# Patient Record
Sex: Female | Born: 1935 | Race: White | Hispanic: No | Smoking: Former smoker
Health system: Southern US, Community
[De-identification: ages and names within clinical notes are randomized; demographics above are authoritative.]

## PROBLEM LIST (undated history)

## (undated) DIAGNOSIS — D649 Anemia, unspecified: Secondary | ICD-10-CM

## (undated) DIAGNOSIS — J189 Pneumonia, unspecified organism: Secondary | ICD-10-CM

## (undated) DIAGNOSIS — I639 Cerebral infarction, unspecified: Secondary | ICD-10-CM

## (undated) DIAGNOSIS — E038 Other specified hypothyroidism: Secondary | ICD-10-CM

## (undated) DIAGNOSIS — G2581 Restless legs syndrome: Secondary | ICD-10-CM

## (undated) DIAGNOSIS — I83009 Varicose veins of unspecified lower extremity with ulcer of unspecified site: Secondary | ICD-10-CM

## (undated) DIAGNOSIS — F419 Anxiety disorder, unspecified: Secondary | ICD-10-CM

## (undated) DIAGNOSIS — M1990 Unspecified osteoarthritis, unspecified site: Secondary | ICD-10-CM

## (undated) DIAGNOSIS — L97909 Non-pressure chronic ulcer of unspecified part of unspecified lower leg with unspecified severity: Secondary | ICD-10-CM

## (undated) DIAGNOSIS — C4490 Unspecified malignant neoplasm of skin, unspecified: Secondary | ICD-10-CM

## (undated) DIAGNOSIS — E119 Type 2 diabetes mellitus without complications: Secondary | ICD-10-CM

## (undated) DIAGNOSIS — I4821 Permanent atrial fibrillation: Secondary | ICD-10-CM

## (undated) DIAGNOSIS — I10 Essential (primary) hypertension: Secondary | ICD-10-CM

## (undated) DIAGNOSIS — H409 Unspecified glaucoma: Secondary | ICD-10-CM

## (undated) DIAGNOSIS — Z91018 Allergy to other foods: Secondary | ICD-10-CM

## (undated) DIAGNOSIS — I4891 Unspecified atrial fibrillation: Secondary | ICD-10-CM

## (undated) DIAGNOSIS — N179 Acute kidney failure, unspecified: Secondary | ICD-10-CM

## (undated) DIAGNOSIS — R7301 Impaired fasting glucose: Secondary | ICD-10-CM

## (undated) DIAGNOSIS — F32A Depression, unspecified: Secondary | ICD-10-CM

## (undated) DIAGNOSIS — R0600 Dyspnea, unspecified: Secondary | ICD-10-CM

## (undated) HISTORY — DX: Restless legs syndrome: G25.81

## (undated) HISTORY — DX: Impaired fasting glucose: R73.01

## (undated) HISTORY — DX: Other specified hypothyroidism: E03.8

## (undated) HISTORY — DX: Essential (primary) hypertension: I10

## (undated) HISTORY — PX: TOTAL KNEE ARTHROPLASTY WITH REVISION COMPONENTS: SHX6198

## (undated) HISTORY — DX: Permanent atrial fibrillation: I48.21

## (undated) HISTORY — DX: Allergy to other foods: Z91.018

## (undated) HISTORY — PX: REPLACEMENT TOTAL KNEE BILATERAL: SUR1225

---

## 1972-02-22 HISTORY — PX: MASTECTOMY: SHX3

## 1991-02-22 HISTORY — PX: ABDOMINAL HYSTERECTOMY: SHX81

## 1998-02-21 HISTORY — PX: OTHER SURGICAL HISTORY: SHX169

## 2012-02-01 LAB — CBC AND DIFFERENTIAL
HCT: 39 % (ref 36–46)
Hemoglobin: 13.3 g/dL (ref 12.0–16.0)
Platelets: 286 10*3/uL (ref 150–399)

## 2012-02-01 LAB — BASIC METABOLIC PANEL: Creatinine: 0.8 mg/dL (ref 0.5–1.1)

## 2012-02-01 LAB — HEPATIC FUNCTION PANEL
ALT: 20 U/L (ref 7–35)
Alkaline Phosphatase: 76 U/L (ref 25–125)
Bilirubin, Total: 0.8 mg/dL

## 2012-02-01 LAB — LIPID PANEL: Cholesterol: 187 mg/dL (ref 0–200)

## 2012-02-01 LAB — TSH: TSH: 0.94 u[IU]/mL (ref 0.41–5.90)

## 2012-06-05 ENCOUNTER — Encounter: Payer: Self-pay | Admitting: *Deleted

## 2012-06-05 DIAGNOSIS — G2581 Restless legs syndrome: Secondary | ICD-10-CM

## 2012-06-05 DIAGNOSIS — E038 Other specified hypothyroidism: Secondary | ICD-10-CM | POA: Insufficient documentation

## 2012-06-05 DIAGNOSIS — I10 Essential (primary) hypertension: Secondary | ICD-10-CM

## 2012-06-05 HISTORY — DX: Restless legs syndrome: G25.81

## 2012-06-12 ENCOUNTER — Ambulatory Visit (INDEPENDENT_AMBULATORY_CARE_PROVIDER_SITE_OTHER): Payer: Medicare Other | Admitting: Family Medicine

## 2012-06-12 VITALS — BP 138/87 | HR 75 | Ht 59.0 in | Wt 168.0 lb

## 2012-06-12 DIAGNOSIS — I10 Essential (primary) hypertension: Secondary | ICD-10-CM

## 2012-06-12 DIAGNOSIS — R7301 Impaired fasting glucose: Secondary | ICD-10-CM

## 2012-06-12 MED ORDER — LISINOPRIL-HYDROCHLOROTHIAZIDE 20-25 MG PO TABS: 1.0000 | ORAL_TABLET | Freq: Every day | ORAL | Status: DC

## 2012-06-12 MED ORDER — METFORMIN HCL ER 500 MG PO TB24: ORAL_TABLET | ORAL | Status: DC

## 2012-06-12 MED ORDER — LEVOTHYROXINE SODIUM 75 MCG PO TABS: 75.0000 ug | ORAL_TABLET | Freq: Every day | ORAL | Status: DC

## 2012-06-12 MED ORDER — PRAMIPEXOLE DIHYDROCHLORIDE 1 MG PO TABS: ORAL_TABLET | ORAL | Status: DC

## 2012-06-12 NOTE — Patient Instructions (Addendum)
1)  Blood Sugar - Take 1-2 of the metformin at night.  2)  BP - We'll get you back on the lisinopril/HCT.  Keep the Lasix on hand just in case you get extra swelling.      Diabetes and Exercise Regular exercise is important and can help:   Control blood glucose (sugar).  Decrease blood pressure.    Control blood lipids (cholesterol, triglycerides).  Improve overall health. BENEFITS FROM EXERCISE  Improved fitness.  Improved flexibility.  Improved endurance.  Increased bone density.  Weight control.  Increased muscle strength.  Decreased body fat.  Improvement of the body's use of insulin, a hormone.  Increased insulin sensitivity.  Reduction of insulin needs.  Reduced stress and tension.  Helps you feel better. People with diabetes who add exercise to their lifestyle gain additional benefits, including:  Weight loss.  Reduced appetite.  Improvement of the body's use of blood glucose.  Decreased risk factors for heart disease:  Lowering of cholesterol and triglycerides.  Raising the level of good cholesterol (high-density lipoproteins, HDL).  Lowering blood sugar.  Decreased blood pressure. TYPE 1 DIABETES AND EXERCISE  Exercise will usually lower your blood glucose.  If blood glucose is greater than 240 mg/dl, check urine ketones. If ketones are present, do not exercise.  Location of the insulin injection sites may need to be adjusted with exercise. Avoid injecting insulin into areas of the body that will be exercised. For example, avoid injecting insulin into:  The arms when playing tennis.  The legs when jogging. For more information, discuss this with your caregiver.  Keep a record of:  Food intake.  Type and amount of exercise.  Expected peak times of insulin action.  Blood glucose levels. Do this before, during, and after exercise. Review your records with your caregiver. This will help you to develop guidelines for adjusting food  intake and insulin amounts.  TYPE 2 DIABETES AND EXERCISE  Regular physical activity can help control blood glucose.  Exercise is important because it may:  Increase the body's sensitivity to insulin.  Improve blood glucose control.  Exercise reduces the risk of heart disease. It decreases serum cholesterol and triglycerides. It also lowers blood pressure.  Those who take insulin or oral hypoglycemic agents should watch for signs of hypoglycemia. These signs include dizziness, shaking, sweating, chills, and confusion.  Body water is lost during exercise. It must be replaced. This will help to avoid loss of body fluids (dehydration) or heat stroke. Be sure to talk to your caregiver before starting an exercise program to make sure it is safe for you. Remember, any activity is better than none.  Document Released: 04/30/2003 Document Revised: 05/02/2011 Document Reviewed: 08/14/2008 [facility] Patient Information 2013 [place], [place].

## 2012-06-12 NOTE — Progress Notes (Signed)
Subjective:     Patient ID: Dana Bradford, female   DOB: 1930-04-12, 79 y.o.   MRN: [id number]  HPI:   Dana Bradford is here today to discuss a few issues.    1) Type II DM:  She continues to take her metformin and needs a refill on it. She has not had any adverse side effects with this medication.   2)  Wound:  The wound on her left lower ankle has healed.    3)  Hypertension:  Her blood pressure is high today.  She is taking the combination of Lisinopril and Lasix.   Review of Systems  Constitutional: Negative for fatigue.  Cardiovascular: Negative for chest pain, palpitations and leg swelling.  Gastrointestinal: Negative for abdominal pain and diarrhea.  Genitourinary: Negative for difficulty urinating.  Skin: Negative for wound (Left ankle wound has healed. ).  Allergic/Immunologic: Positive for food allergies (Fish (Hives) ).  Neurological: Negative.   Hematological: Negative.   Psychiatric/Behavioral: Negative.    Past Medical History  Diagnosis Date  . Other specified acquired hypothyroidism   . Hypertension   . Impaired fasting glucose    Family History  Problem Relation Age of Onset  . Cancer Mother   . Heart disease Father   . Hyperlipidemia Father        Objective:   Physical Exam  Constitutional: She appears well-nourished. No distress.  HENT:  Head: Normocephalic.  Eyes: No scleral icterus.  Neck: No thyromegaly present.  Cardiovascular: Normal rate, regular rhythm and normal heart sounds.   Pulmonary/Chest: Effort normal and breath sounds normal.  Abdominal: There is no tenderness.  Musculoskeletal: She exhibits no edema and no tenderness.  Neurological: She is alert.  Skin: Skin is warm and dry.  Psychiatric: She has a normal mood and affect. Her behavior is normal. Judgment and thought content normal.       Assessment:      Hypertension Impaired Fasting Glucose      Plan:     1)  We'll change her back to Lisinipril/HCT.   2)  She will remain on  metformin and we'll recheck her A1c in 3 months.

## 2012-06-17 ENCOUNTER — Encounter: Payer: Self-pay | Admitting: Family Medicine

## 2012-06-17 DIAGNOSIS — R7301 Impaired fasting glucose: Secondary | ICD-10-CM | POA: Insufficient documentation

## 2012-06-17 DIAGNOSIS — I10 Essential (primary) hypertension: Secondary | ICD-10-CM

## 2012-06-17 HISTORY — DX: Essential (primary) hypertension: I10

## 2012-11-02 ENCOUNTER — Other Ambulatory Visit: Payer: Self-pay | Admitting: *Deleted

## 2012-11-02 DIAGNOSIS — R7301 Impaired fasting glucose: Secondary | ICD-10-CM

## 2012-11-05 ENCOUNTER — Other Ambulatory Visit: Payer: Medicare Other

## 2012-11-05 LAB — COMPREHENSIVE METABOLIC PANEL
ALT: 20 U/L (ref 0–35)
AST: 19 U/L (ref 0–37)
Albumin: 3.9 g/dL (ref 3.5–5.2)
Alkaline Phosphatase: 54 U/L (ref 39–117)
BUN: 26 mg/dL — ABNORMAL HIGH (ref 6–23)
CO2: 27 mEq/L (ref 19–32)
Calcium: 9.5 mg/dL (ref 8.4–10.5)
Chloride: 100 mEq/L (ref 96–112)
Creat: 0.81 mg/dL (ref 0.50–1.10)
Glucose, Bld: 93 mg/dL (ref 70–99)
Potassium: 4.1 mEq/L (ref 3.5–5.3)
Sodium: 134 mEq/L — ABNORMAL LOW (ref 135–145)
Total Bilirubin: 0.4 mg/dL (ref 0.3–1.2)
Total Protein: 7 g/dL (ref 6.0–8.3)

## 2012-11-05 LAB — HEMOGLOBIN A1C
Hgb A1c MFr Bld: 6.4 % — ABNORMAL HIGH (ref <5.7)
Mean Plasma Glucose: 137 mg/dL — ABNORMAL HIGH (ref <117)

## 2012-11-12 ENCOUNTER — Ambulatory Visit (INDEPENDENT_AMBULATORY_CARE_PROVIDER_SITE_OTHER): Payer: Medicare Other | Admitting: Family Medicine

## 2012-11-12 ENCOUNTER — Encounter: Payer: Self-pay | Admitting: Family Medicine

## 2012-11-12 VITALS — BP 134/89 | HR 80 | Resp 16 | Ht 59.0 in | Wt 162.0 lb

## 2012-11-12 DIAGNOSIS — K219 Gastro-esophageal reflux disease without esophagitis: Secondary | ICD-10-CM

## 2012-11-12 DIAGNOSIS — R7301 Impaired fasting glucose: Secondary | ICD-10-CM

## 2012-11-12 MED ORDER — PANTOPRAZOLE SODIUM 40 MG PO TBEC: 40.0000 mg | DELAYED_RELEASE_TABLET | Freq: Every day | ORAL | Status: DC

## 2012-11-12 NOTE — Progress Notes (Signed)
  Subjective:    Patient ID: Dana Bradford, female    DOB: May 05, 1930, 79 y.o.   MRN: [id number]  HPI  [name] is here today to go over her most recent lab results and to discuss the conditions listed below:    1)  IFG:  She is doing well taking two metformin 500 mg pills at bedtime.   She admits that she has not really been following a diabetic diet.    2)  Hypertension:  She is doing well with her lisinopril/HCTZ 20-25 mg.    3)  GERD:  She has been having acid reflux.  She has been struggling with this problem off and on for several years.  She takes Tums as needed but she feels that her acid reflux is worsening.  4)  Leg Swelling:  She is having increased swelling in her right leg.  She has had this off and on ever since she had surgery on her right knee.     Review of Systems  Constitutional: Negative.   HENT: Negative.   Eyes: Negative.   Respiratory: Negative.   Cardiovascular: Negative.   Gastrointestinal:       Acid reflux  Endocrine: Negative.   Genitourinary: Negative.   Musculoskeletal: Negative.   Skin: Negative.   Allergic/Immunologic: Negative.   Neurological: Negative.   Hematological: Negative.   Psychiatric/Behavioral: Negative.     Past Medical History  Diagnosis Date  . Other specified acquired hypothyroidism   . Hypertension   . Impaired fasting glucose   . Restless leg   . Food allergy     Fish (Hives)      Family History  Problem Relation Age of Onset  . Cancer Mother   . Heart disease Father   . Hyperlipidemia Father     History   Social History Narrative   Marital Status: Widowed    Children:  Daughter Skipper Cliche) Son Astria Jordahl)   Pets: None    Living Situation: Lives alone    Occupation: Retired    Education: Engineer, agricultural    Tobacco:  She quit smoking 35 years ago after having smoked 1 ppd for 20 years.      Alcohol Use:  One glass of wine twice a week     Drug Use:  None   Diet:  Regular   Exercise:  None   Hobbies: Crafting, Gardening.                 Objective:   Physical Exam  Vitals reviewed. Constitutional: She is oriented to person, place, and time. She appears well-developed and well-nourished.  Cardiovascular: Normal rate and regular rhythm.   Pulmonary/Chest: Effort normal and breath sounds normal.  Neurological: She is alert and oriented to person, place, and time.  Skin: Skin is warm and dry.  Psychiatric: She has a normal mood and affect.          Assessment & Plan:

## 2012-11-12 NOTE — Patient Instructions (Addendum)
1)  Blood Sugar - We need for you to work harder on your diet and exercise.  If your A1c (average blood sugar over 3 months) goes to 6.5%, that officially gives you the diagnosis.  You can help decrease your sugar by limiting foods with sugar and by limiting "bad carbs" for example:  White rice, potatoes, bread, pasta.    2)  GERD - Take the Dexilant 1 per day till you get your Protonix in the mail.     1800 Calorie Diet for Diabetes Meal Planning The 1800 calorie diet is designed for eating up to 1800 calories each day. Following this diet and making healthy meal choices can help improve overall health. This diet controls blood sugar (glucose) levels and can also help lower blood pressure and cholesterol. SERVING SIZES Measuring foods and serving sizes helps to make sure you are getting the right amount of food. The list below tells how big or small some common serving sizes are:  1 oz.........4 stacked dice.  3 oz........Marland KitchenDeck of cards.  1 tsp.......Marland KitchenTip of little finger.  1 tbs......Marland KitchenMarland KitchenThumb.  2 tbs.......Marland KitchenGolf ball.   cup......Marland KitchenHalf of a fist.  1 cup.......Marland KitchenA fist. GUIDELINES FOR CHOOSING FOODS The goal of this diet is to eat a variety of foods and limit calories to 1800 each day. This can be done by choosing foods that are low in calories and fat. The diet also suggests eating small amounts of food frequently. Doing this helps control your blood glucose levels so they do not get too high or too low. Each meal or snack may include a protein food source to help you feel more satisfied and to stabilize your blood glucose. Try to eat about the same amount of food around the same time each day. This includes weekend days, travel days, and days off work. Space your meals about 4 to 5 hours apart and add a snack between them if you wish.  For example, a daily food plan could include breakfast, a morning snack, lunch, dinner, and an evening snack. Healthy meals and snacks include whole grains,  vegetables, fruits, lean meats, poultry, fish, and dairy products. As you plan your meals, select a variety of foods. Choose from the bread and starch, vegetable, fruit, dairy, and meat/protein groups. Examples of foods from each group and their suggested serving sizes are listed below. Use measuring cups and spoons to become familiar with what a healthy portion looks like. Bread and Starch Each serving equals 15 grams of carbohydrates.  1 slice bread.   bagel.   cup cold cereal (unsweetened).   cup hot cereal or mashed potatoes.  1 small potato (size of a computer mouse).   cup cooked pasta or rice.   English muffin.  1 cup broth-based soup.  3 cups of popcorn.  4 to 6 whole-wheat crackers.   cup cooked beans, peas, or corn. Vegetable Each serving equals 5 grams of carbohydrates.   cup cooked vegetables.  1 cup raw vegetables.   cup tomato or vegetable juice. Fruit Each serving equals 15 grams of carbohydrates.  1 small apple or orange.  1 cup watermelon or strawberries.   cup applesauce (no sugar added).  2 tbs raisins.   banana.   cup canned fruit, packed in water, its own juice, or sweetened with a sugar substitute.   cup unsweetened fruit juice. Dairy Each serving equals 12 to 15 grams of carbohydrates.  1 cup fat-free milk.  6 oz artificially sweetened yogurt or plain yogurt.  1 cup low-fat buttermilk.  1 cup soy milk.  1 cup almond milk. Meat/Protein  1 large egg.  2 to 3 oz meat, poultry, or fish.   cup low-fat cottage cheese.  1 tbs peanut butter.  1 oz low-fat cheese.   cup tuna in water.   cup tofu. Fat  1 tsp oil.  1 tsp trans-fat-free margarine.  1 tsp butter.  1 tsp mayonnaise.  2 tbs avocado.  1 tbs salad dressing.  1 tbs cream cheese.  2 tbs sour cream. SAMPLE 1800 CALORIE DIET PLAN Breakfast   cup unsweetened cereal (1 carb serving).  1 cup fat-free milk (1 carb serving).  1 slice  whole-wheat toast (1 carb serving).   small banana (1 carb serving).  1 scrambled egg.  1 tsp trans-fat-free margarine. Lunch  Tuna sandwich.  2 slices whole-wheat bread (2 carb servings).   cup canned tuna in water, drained.  1 tbs reduced fat mayonnaise.  1 stalk celery, chopped.  2 slices tomato.  1 lettuce leaf.  1 cup carrot sticks.  24 to 30 seedless grapes (2 carb servings).  6 oz light yogurt (1 carb serving). Afternoon Snack  3 graham cracker squares (1 carb serving).  Fat-free milk, 1 cup (1 carb serving).  1 tbs peanut butter. Dinner  3 oz salmon, broiled with 1 tsp oil.  1 cup mashed potatoes (2 carb servings) with 1 tsp trans-fat-free margarine.  1 cup fresh or frozen green beans.  1 cup steamed asparagus.  1 cup fat-free milk (1 carb serving). Evening Snack  3 cups air-popped popcorn (1 carb serving).  2 tbs parmesan cheese sprinkled on top. MEAL PLAN Use this worksheet to help you make a daily meal plan based on the 1800 calorie diet suggestions. If you are using this plan to help you control your blood glucose, you may interchange carbohydrate-containing foods (dairy, starches, and fruits). Select a variety of fresh foods of varying colors and flavors. The total amount of carbohydrate in your meals or snacks is more important than making sure you include all of the food groups every time you eat. Choose from the following foods to build your day's meals:  8 Starches.  4 Vegetables.  3 Fruits.  2 Dairy.  6 to 7 oz Meat/Protein.  Up to 4 Fats. Your dietician can use this worksheet to help you decide how many servings and which types of foods are right for you. BREAKFAST Food Group and Servings / Food Choice Starch ________________________________________________________ Dairy _________________________________________________________ Fruit _________________________________________________________ Meat/Protein  __________________________________________________ Fat ___________________________________________________________ LUNCH Food Group and Servings / Food Choice Starch ________________________________________________________ Meat/Protein __________________________________________________ Vegetable _____________________________________________________ Fruit _________________________________________________________ Dairy _________________________________________________________ Fat ___________________________________________________________ Aura Fey Food Group and Servings / Food Choice Starch ________________________________________________________ Meat/Protein __________________________________________________ Fruit __________________________________________________________ Dairy _________________________________________________________ Laural Golden Food Group and Servings / Food Choice Starch _________________________________________________________ Meat/Protein ___________________________________________________ Dairy __________________________________________________________ Vegetable ______________________________________________________ Fruit ___________________________________________________________ Fat ____________________________________________________________ Lollie Sails Food Group and Servings / Food Choice Fruit __________________________________________________________ Meat/Protein ___________________________________________________ Dairy __________________________________________________________ Starch _________________________________________________________ DAILY TOTALS Starch ____________________________ Vegetable _________________________ Fruit _____________________________ Dairy _____________________________ Meat/Protein______________________ Fat _______________________________ Document Released: 08/30/2004 Document Revised: 05/02/2011 Document Reviewed:  12/24/2010 [employer] Patient Information 2014 [place], [place].  Complementary and Alternative Medical Therapies for Diabetes Complementary and alternative medicines are health care practices or products that are not always accepted as part of routine medicine. Complementary medicine is used along with routine medicine (medical therapy). Alternative medicine can sometimes be used instead of routine medicine. Some people use these methods to treat diabetes. While some of  these therapies may be effective, others may not be. Some may even be harmful. Patients using these methods need to tell their caregiver. It is important to let your caregivers know what you are doing. Some of these therapies are discussed below. For more information, talk with your caregiver. THERAPIES Acupuncture Acupuncture is done by a professional who inserts needles into certain points on the skin. Some scientists believe that this triggers the release of the body's natural painkillers. It has been shown to relieve long-term (chronic) pain. This may help patients with painful nerve damage caused by diabetes. Biofeedback Biofeedback helps a person become more aware of the body's response to pain. It also helps you learn to deal with the pain. This alternative therapy focuses on relaxation and stress-reduction techniques. Thinking of peaceful mental images (guided imagery) is one technique. Some people believe these images can ease their condition. MEDICATIONS Chromium Several studies report that chromium supplements may improve diabetes control. Chromium helps insulin improve its action. Research is not yet certain. Supplements have not been recommended or approved. Caution is needed if you have kidney (renal) problems. Ginseng There are several types of ginseng plants. American ginseng is used for diabetes studies. Those studies have shown some glucose-lowering effects. Those effects have been seen with fasting and after-meal blood  glucose levels. They have also been seen in A1c levels (average blood glucose levels over a 19-month period). More long-term studies are needed before recommendations for use of ginseng can be made. Magnesium Experts have studied the relationship between magnesium and diabetes for many years. But it is not yet fully understood. Studies suggest that a low amount of magnesium may make blood glucose control worse in type 2 diabetes. Research also shows that a low amount may contribute to certain diabetes complications. One study showed that people who consume more magnesium had less risk of type 2 diabetes. Eating whole grains, nuts, and green leafy vegetables raises the magnesium level. Vanadium Vanadium is a compound found in tiny amounts in plants and animals. Early studies showed that vanadium improved blood glucose levels in animals with type 1 and type 2 diabetes. One study found that when given vanadium, those with diabetes were able to decrease their insulin dosage. Researchers still need to learn how it works in the body to discover any side effects, and to find safe dosages. Cinnamon There have been a couple of studies that seem to indicate cinnamon decreases insulin resistance and increases insulin production. By doing so, it may lower blood glucose. Exact doses are unknown, but it may work best when used in combination with other diabetes medicines. Document Released: 12/05/2006 Document Revised: 05/02/2011 Document Reviewed: 12/18/2008 [facility] Patient Information 2014 [place], [place].

## 2012-11-13 ENCOUNTER — Encounter: Payer: Self-pay | Admitting: Family Medicine

## 2012-11-13 DIAGNOSIS — K219 Gastro-esophageal reflux disease without esophagitis: Secondary | ICD-10-CM | POA: Insufficient documentation

## 2012-11-13 HISTORY — DX: Gastro-esophageal reflux disease without esophagitis: K21.9

## 2012-11-13 NOTE — Assessment & Plan Note (Signed)
She will begin on Protonix.  A prescription was sent to [facility].

## 2012-11-13 NOTE — Assessment & Plan Note (Signed)
She is going to work harder on her diet and exercise.

## 2012-11-30 ENCOUNTER — Other Ambulatory Visit: Payer: Self-pay | Admitting: Family Medicine

## 2013-05-13 ENCOUNTER — Ambulatory Visit (INDEPENDENT_AMBULATORY_CARE_PROVIDER_SITE_OTHER): Payer: Medicare Other | Admitting: Family Medicine

## 2013-05-13 ENCOUNTER — Encounter: Payer: Self-pay | Admitting: Family Medicine

## 2013-05-13 ENCOUNTER — Encounter (INDEPENDENT_AMBULATORY_CARE_PROVIDER_SITE_OTHER): Payer: Self-pay

## 2013-05-13 VITALS — BP 141/96 | HR 69 | Resp 16 | Wt 164.0 lb

## 2013-05-13 DIAGNOSIS — I10 Essential (primary) hypertension: Secondary | ICD-10-CM

## 2013-05-13 DIAGNOSIS — E039 Hypothyroidism, unspecified: Secondary | ICD-10-CM

## 2013-05-13 DIAGNOSIS — E119 Type 2 diabetes mellitus without complications: Secondary | ICD-10-CM

## 2013-05-13 DIAGNOSIS — G2581 Restless legs syndrome: Secondary | ICD-10-CM

## 2013-05-13 DIAGNOSIS — H6120 Impacted cerumen, unspecified ear: Secondary | ICD-10-CM

## 2013-05-13 DIAGNOSIS — K219 Gastro-esophageal reflux disease without esophagitis: Secondary | ICD-10-CM

## 2013-05-13 LAB — COMPLETE METABOLIC PANEL WITH GFR
ALT: 21 U/L (ref 0–35)
AST: 19 U/L (ref 0–37)
Albumin: 4.1 g/dL (ref 3.5–5.2)
Alkaline Phosphatase: 61 U/L (ref 39–117)
BUN: 23 mg/dL (ref 6–23)
CO2: 29 mEq/L (ref 19–32)
Calcium: 9.5 mg/dL (ref 8.4–10.5)
Chloride: 98 mEq/L (ref 96–112)
Creat: 0.82 mg/dL (ref 0.50–1.10)
GFR, Est African American: 78 mL/min
GFR, Est Non African American: 68 mL/min
Glucose, Bld: 101 mg/dL — ABNORMAL HIGH (ref 70–99)
Potassium: 4.5 mEq/L (ref 3.5–5.3)
Sodium: 136 mEq/L (ref 135–145)
Total Bilirubin: 0.5 mg/dL (ref 0.2–1.2)
Total Protein: 7 g/dL (ref 6.0–8.3)

## 2013-05-13 LAB — T3, FREE: T3, Free: 2.9 pg/mL (ref 2.3–4.2)

## 2013-05-13 LAB — T4, FREE: Free T4: 1.68 ng/dL (ref 0.80–1.80)

## 2013-05-13 LAB — TSH: TSH: 0.597 u[IU]/mL (ref 0.350–4.500)

## 2013-05-13 LAB — POCT GLYCOSYLATED HEMOGLOBIN (HGB A1C): Hemoglobin A1C: 6

## 2013-05-13 MED ORDER — PANTOPRAZOLE SODIUM 40 MG PO TBEC: 40.0000 mg | DELAYED_RELEASE_TABLET | Freq: Every day | ORAL | Status: DC

## 2013-05-13 MED ORDER — PRAMIPEXOLE DIHYDROCHLORIDE 1 MG PO TABS: 1.0000 mg | ORAL_TABLET | Freq: Every evening | ORAL | Status: DC

## 2013-05-13 MED ORDER — METFORMIN HCL ER 500 MG PO TB24: 1000.0000 mg | ORAL_TABLET | Freq: Every day | ORAL | Status: DC

## 2013-05-13 MED ORDER — METOPROLOL SUCCINATE ER 25 MG PO TB24: 25.0000 mg | ORAL_TABLET | Freq: Every evening | ORAL | Status: DC

## 2013-05-13 MED ORDER — LEVOTHYROXINE SODIUM 75 MCG PO TABS: 75.0000 ug | ORAL_TABLET | Freq: Every day | ORAL | Status: DC

## 2013-05-13 MED ORDER — LISINOPRIL-HYDROCHLOROTHIAZIDE 20-25 MG PO TABS: 1.0000 | ORAL_TABLET | Freq: Every morning | ORAL | Status: DC

## 2013-05-13 NOTE — Patient Instructions (Signed)
1)  Blood Pressure - We are adding a low dosage of another medication called Toprol XL 25 mg which I want you to take in the evening.  This will hopefully give you better 24 hour coverage of your blood pressure.  Check your readings at home and bring in your machine when you come in for your ear flushing.   2)  Ear Wax - Fill your ears nightly with the Debrox as I demonstrated (straightening/opening your ear canal) for at least one week prior to [place] flushing the ear canals.    3)  Blood Sugar - Your A1c is the best that it has been 6% so keep taking 1000 mg of the metformin and continue to watch your diet.     Cerumen Impaction The structures of the external ear canal secrete a waxy substance known as cerumen. Excess cerumen can build up in the ear canal, causing a condition known as cerumen impaction. Cerumen impaction can cause ear pain as well as disrupt the function of the ear. The rate of cerumen production differs for each individual. For certain individuals, the configuration of one's ear canal may cause him or her to have a decreased ability to naturally remove cerumen. It is important to note that removing cerumen as a part of normal hygiene is not necessary, and the use of swabs in the ear canal is not recommended. SYMPTOMS   Diminished hearing.  Ear drainage.  Ear pain.  Ear itch. CAUSES  Excessive cerumen production.  RISK INCREASES WITH:  Frequent use of swabs to clean ears.  Narrow ear canals.  Eczema (a skin condition).  Dehydration. PREVENTION  Do Not insert objects into the ear, even with the intent of cleaning the ear.  Maintain hydration.  Control eczema if present. TREATMENT  Maintaining preventative measures is the best way to treat cerumen impaction. If symptoms of cerumen impaction develop the first step is to use over-the-counter or prescription ear drops that are intended to soften the cerumen. If the cerumen does not clear, then visit your caregiver to  have the cerumen removed. The most common method for cerumen removal is through irrigation with warm water. Although, some caregivers use ear curettes and other instruments to remove the cerumen physically. In the most severe cases, cerumen may be removed surgically. Document Released: 02/07/2005 Document Revised: 05/02/2011 Document Reviewed: 05/22/2008 [employer] [facility] Patient Information 2014 [place], [place].

## 2013-05-13 NOTE — Progress Notes (Signed)
Subjective:    Patient ID: Dana Bradford, female    DOB: Jun 21, 1930, 80 y.o.   MRN: [id number]  HPI  Dana Bradford is here today to get her medications refilled.  She has new prescription plan and she would like her prescriptions written for 90 day supply.  She also wants to discuss the conditions listed below:   1)  Hearing Problem - She feels that her ears may need to be flushed.  She occasionally gets too much wax built up and it interferes with her hearing.   2)  Type II DM - She continues taking her Metformin (500 mg, daily).  She needs her A1c rechecked.   3)  RLS - Her symptoms are controlled with Mirapex.   4)  Hypothyroidism - Her energy level seems to be normal on her current dosage.     Review of Systems  Constitutional: Negative for activity change, fatigue and unexpected weight change.  HENT: Positive for hearing loss (She feels that she has wax buildup.  ). Negative for ear discharge and ear pain.   Eyes: Negative.   Respiratory: Negative for shortness of breath.   Cardiovascular: Negative for chest pain, palpitations and leg swelling.  Gastrointestinal: Negative for diarrhea and constipation.  Endocrine: Negative.   Genitourinary: Negative for difficulty urinating.  Musculoskeletal: Negative.   Skin: Negative.   Neurological: Negative.   Hematological: Negative for adenopathy. Does not bruise/bleed easily.  Psychiatric/Behavioral: Negative for sleep disturbance and dysphoric mood. The patient is not nervous/anxious.      Past Medical History  Diagnosis Date  . Other specified acquired hypothyroidism   . Hypertension   . Impaired fasting glucose   . Restless leg   . Food allergy     Fish (Hives)      Past Surgical History  Procedure Laterality Date  . Spleenectomy  2000  . Abdominal hysterectomy  1993    Uterine Prolapse  . Mastectomy  1974     History   Social History Narrative   Marital Status: Widowed    Children:  Daughter Dana Bradford) Son  Dana Bradford)   Pets: None    Living Situation: Lives alone    Occupation: Retired    Education: Programmer, systems    Tobacco:  She quit smoking 35 years ago after having smoked 1 ppd for 20 years.      Alcohol Use:  One glass of wine twice a week     Drug Use:  None   Diet:  Regular   Exercise:  None   Hobbies: Crafting, Gardening.               Family History  Problem Relation Age of Onset  . Cancer Mother   . Heart disease Father   . Hyperlipidemia Father      Current Outpatient Prescriptions on File Prior to Visit  Medication Sig Dispense Refill  . bimatoprost (LUMIGAN) 0.03 % ophthalmic solution 1 drop at bedtime.      . timolol (TIMOPTIC) 0.5 % ophthalmic solution       . furosemide (LASIX) 40 MG tablet Take 40 mg by mouth daily.       No current facility-administered medications on file prior to visit.     Allergies  Allergen Reactions  . Arthrotec [Diclofenac-Misoprostol] Diarrhea    Shortness of breath  . Celebrex [Celecoxib] Other (See Comments)    GI Bleeding     Immunization History  Administered Date(s) Administered  . Pneumococcal Polysaccharide-23 02/21/2010  .  Tdap 02/21/2010  . Zoster 02/21/2010      Objective:   Physical Exam  Vitals reviewed. Constitutional: She is oriented to person, place, and time.  HENT:  Right Ear: Decreased hearing is noted.  Left Ear: Decreased hearing is noted.  Cerumen is blocking ear canals   Eyes: Conjunctivae are normal. No scleral icterus.  Neck: Neck supple. No thyromegaly present.  Cardiovascular: Normal rate, regular rhythm and normal heart sounds.   Pulmonary/Chest: Effort normal and breath sounds normal.  Musculoskeletal: She exhibits no edema and no tenderness.  Lymphadenopathy:    She has no cervical adenopathy.  Neurological: She is alert and oriented to person, place, and time.  Skin: Skin is warm and dry.  Psychiatric: She has a normal mood and affect. Her behavior is normal. Judgment and  thought content normal.      Assessment & Plan:    Dana Bradford was seen today for medication management.  Diagnoses and associated orders for this visit:  Type II or unspecified type diabetes mellitus without mention of complication, not stated as uncontrolled - POCT glycosylated hemoglobin (Hb A1C) 6.0% - metFORMIN (GLUCOPHAGE-XR) 500 MG 24 hr tablet; Take 2 tablets (1,000 mg total) by mouth at bedtime. - COMPLETE METABOLIC PANEL WITH GFR  GERD (gastroesophageal reflux disease) - pantoprazole (PROTONIX) 40 MG tablet; Take 1 tablet (40 mg total) by mouth daily.  Unspecified hypothyroidism - levothyroxine (SYNTHROID, LEVOTHROID) 75 MCG tablet; Take 1 tablet (75 mcg total) by mouth daily before breakfast.  His levels are WNL so he'll stay on his current dosage.   - TSH - T3, free - T4, free  Restless leg - pramipexole (MIRAPEX) 1 MG tablet; Take 1 tablet (1 mg total) by mouth every evening. Take up to 2 pills per day  Essential hypertension, benign - metoprolol succinate (TOPROL-XL) 25 MG 24 hr tablet; Take 1 tablet (25 mg total) by mouth every evening. - lisinopril-hydrochlorothiazide (PRINZIDE,ZESTORETIC) 20-25 MG per tablet; Take 1 tablet by mouth every morning.  Cerumen impaction Comments: She is to put Debrox in her ears and return in a week to have them flushed.      TIME SPENT "FACE TO FACE" WITH PATIENT -  30 MINS

## 2013-06-04 ENCOUNTER — Telehealth: Payer: Self-pay | Admitting: *Deleted

## 2013-06-04 NOTE — Telephone Encounter (Signed)
Contacted patient to inform her that her lab results were normal.  She was scheduled also to have her ears flushed but she cancelled her 06/05/13 appt.  She said that her ears were bothering her and she went to see na ENT.  Her ears are much better now.  Her results are being mailed to her. PG

## 2013-06-05 ENCOUNTER — Ambulatory Visit: Payer: Medicare Other | Admitting: Family Medicine

## 2014-07-22 DIAGNOSIS — E876 Hypokalemia: Secondary | ICD-10-CM | POA: Insufficient documentation

## 2014-07-22 DIAGNOSIS — R609 Edema, unspecified: Secondary | ICD-10-CM | POA: Insufficient documentation

## 2014-07-23 DIAGNOSIS — T8453XA Infection and inflammatory reaction due to internal right knee prosthesis, initial encounter: Secondary | ICD-10-CM

## 2014-07-23 HISTORY — DX: Infection and inflammatory reaction due to internal right knee prosthesis, initial encounter: T84.53XA

## 2014-08-08 DIAGNOSIS — T8459XA Infection and inflammatory reaction due to other internal joint prosthesis, initial encounter: Secondary | ICD-10-CM | POA: Insufficient documentation

## 2014-08-08 HISTORY — DX: Infection and inflammatory reaction due to other internal joint prosthesis, initial encounter: T84.59XA

## 2014-08-12 DIAGNOSIS — N819 Female genital prolapse, unspecified: Secondary | ICD-10-CM | POA: Insufficient documentation

## 2014-08-12 HISTORY — DX: Female genital prolapse, unspecified: N81.9

## 2014-11-18 ENCOUNTER — Encounter (HOSPITAL_BASED_OUTPATIENT_CLINIC_OR_DEPARTMENT_OTHER): Payer: PRIVATE HEALTH INSURANCE

## 2014-11-29 DIAGNOSIS — R739 Hyperglycemia, unspecified: Secondary | ICD-10-CM | POA: Insufficient documentation

## 2014-11-29 DIAGNOSIS — T368X5A Adverse effect of other systemic antibiotics, initial encounter: Secondary | ICD-10-CM | POA: Insufficient documentation

## 2014-12-01 DIAGNOSIS — E872 Acidosis, unspecified: Secondary | ICD-10-CM | POA: Insufficient documentation

## 2014-12-26 DIAGNOSIS — I87303 Chronic venous hypertension (idiopathic) without complications of bilateral lower extremity: Secondary | ICD-10-CM | POA: Insufficient documentation

## 2014-12-26 DIAGNOSIS — I890 Lymphedema, not elsewhere classified: Secondary | ICD-10-CM | POA: Insufficient documentation

## 2014-12-26 HISTORY — DX: Chronic venous hypertension (idiopathic) without complications of bilateral lower extremity: I87.303

## 2014-12-26 HISTORY — DX: Lymphedema, not elsewhere classified: I89.0

## 2017-04-21 HISTORY — PX: OTHER SURGICAL HISTORY: SHX169

## 2017-05-13 DIAGNOSIS — Z89421 Acquired absence of other right toe(s): Secondary | ICD-10-CM | POA: Insufficient documentation

## 2017-05-13 HISTORY — DX: Acquired absence of other right toe(s): Z89.421

## 2017-05-16 MED ORDER — ENOXAPARIN SODIUM 40 MG/0.4ML ~~LOC~~ SOLN
40.00 | SUBCUTANEOUS | Status: DC
Start: 2017-05-16 — End: 2017-05-16

## 2017-05-16 MED ORDER — LEVOTHYROXINE SODIUM 88 MCG PO TABS
88.00 | ORAL_TABLET | ORAL | Status: DC
Start: 2017-05-16 — End: 2017-05-16

## 2017-05-16 MED ORDER — METOPROLOL SUCCINATE ER 50 MG PO TB24
50.00 | ORAL_TABLET | ORAL | Status: DC
Start: 2017-05-15 — End: 2017-05-16

## 2017-05-16 MED ORDER — INSULIN LISPRO 100 UNIT/ML ~~LOC~~ SOLN
2.00 | SUBCUTANEOUS | Status: DC
Start: 2017-05-16 — End: 2017-05-16

## 2017-05-16 MED ORDER — ASPIRIN EC 81 MG PO TBEC
81.00 | DELAYED_RELEASE_TABLET | ORAL | Status: DC
Start: 2017-05-16 — End: 2017-05-16

## 2017-05-16 MED ORDER — PRAVASTATIN SODIUM 10 MG PO TABS
20.00 | ORAL_TABLET | ORAL | Status: DC
Start: 2017-05-16 — End: 2017-05-16

## 2017-05-16 MED ORDER — LOSARTAN POTASSIUM 50 MG PO TABS
100.00 | ORAL_TABLET | ORAL | Status: DC
Start: 2017-05-16 — End: 2017-05-16

## 2017-05-16 MED ORDER — GABAPENTIN 300 MG PO CAPS
300.00 | ORAL_CAPSULE | ORAL | Status: DC
Start: 2017-05-15 — End: 2017-05-16

## 2017-05-16 MED ORDER — PRAMIPEXOLE DIHYDROCHLORIDE 0.25 MG PO TABS
1.00 | ORAL_TABLET | ORAL | Status: DC
Start: 2017-05-15 — End: 2017-05-16

## 2017-05-16 MED ORDER — GENERIC EXTERNAL MEDICATION
500.00 | Status: DC
Start: ? — End: 2017-05-16

## 2017-05-16 MED ORDER — DEXTROSE 10 % IV SOLN
125.00 | INTRAVENOUS | Status: DC
Start: ? — End: 2017-05-16

## 2017-05-16 MED ORDER — ACETAMINOPHEN 325 MG PO TABS
650.00 | ORAL_TABLET | ORAL | Status: DC
Start: 2017-05-15 — End: 2017-05-16

## 2017-05-16 MED ORDER — CEPHALEXIN 500 MG PO CAPS
500.00 | ORAL_CAPSULE | ORAL | Status: DC
Start: 2017-05-15 — End: 2017-05-16

## 2017-05-22 DIAGNOSIS — I639 Cerebral infarction, unspecified: Secondary | ICD-10-CM

## 2017-05-22 DIAGNOSIS — Z8673 Personal history of transient ischemic attack (TIA), and cerebral infarction without residual deficits: Secondary | ICD-10-CM | POA: Insufficient documentation

## 2017-05-22 HISTORY — DX: Cerebral infarction, unspecified: I63.9

## 2017-06-12 DIAGNOSIS — H34231 Retinal artery branch occlusion, right eye: Secondary | ICD-10-CM

## 2017-06-12 HISTORY — DX: Retinal artery branch occlusion, right eye: H34.231

## 2017-10-18 DIAGNOSIS — H401134 Primary open-angle glaucoma, bilateral, indeterminate stage: Secondary | ICD-10-CM | POA: Insufficient documentation

## 2017-10-18 DIAGNOSIS — E1165 Type 2 diabetes mellitus with hyperglycemia: Secondary | ICD-10-CM

## 2017-10-18 DIAGNOSIS — D3131 Benign neoplasm of right choroid: Secondary | ICD-10-CM | POA: Insufficient documentation

## 2017-10-18 DIAGNOSIS — H34211 Partial retinal artery occlusion, right eye: Secondary | ICD-10-CM | POA: Insufficient documentation

## 2017-10-18 DIAGNOSIS — H1045 Other chronic allergic conjunctivitis: Secondary | ICD-10-CM | POA: Insufficient documentation

## 2017-10-18 DIAGNOSIS — H353122 Nonexudative age-related macular degeneration, left eye, intermediate dry stage: Secondary | ICD-10-CM

## 2017-10-18 DIAGNOSIS — H04123 Dry eye syndrome of bilateral lacrimal glands: Secondary | ICD-10-CM | POA: Insufficient documentation

## 2017-10-18 HISTORY — DX: Nonexudative age-related macular degeneration, left eye, intermediate dry stage: H35.3122

## 2017-10-18 HISTORY — DX: Type 2 diabetes mellitus with hyperglycemia: E11.65

## 2017-10-18 HISTORY — DX: Primary open-angle glaucoma, bilateral, indeterminate stage: H40.1134

## 2017-10-18 HISTORY — DX: Partial retinal artery occlusion, right eye: H34.211

## 2018-02-06 DIAGNOSIS — M25551 Pain in right hip: Secondary | ICD-10-CM

## 2018-02-06 HISTORY — DX: Pain in right hip: M25.551

## 2018-02-06 NOTE — H&P (Signed)
TOTAL HIP ADMISSION H&P  Patient is admitted for right total hip arthroplasty.  Subjective:  Chief Complaint: right hip pain  HPI: Dana Bradford, 84 y.o. female, has a history of pain and functional disability in the right hip(s) due to arthritis and patient has failed non-surgical conservative treatments for greater than 12 weeks to include use of assistive devices and activity modification.  Onset of symptoms was gradual starting several years ago with gradually worsening course since that time.The patient noted no past surgery on the right hip(s).  Patient currently rates pain in the right hip at 10 out of 10 with activity. Patient has worsening of pain with activity and weight bearing and instability. Patient has evidence of severe bone-on-bone arthritis, right worse than left, with subchondral cystic formation and osteophyte formation by imaging studies. This condition presents safety issues increasing the risk of falls. There is no current active infection.  Patient Active Problem List   Diagnosis Date Noted  . GERD (gastroesophageal reflux disease) 11/13/2012  . Essential hypertension, benign 06/17/2012  . Impaired fasting glucose 06/17/2012  . Other specified acquired hypothyroidism 06/05/2012  . Restless leg syndrome 06/05/2012   Past Medical History:  Diagnosis Date  . Food allergy    Fish (Hives)   . Hypertension   . Impaired fasting glucose   . Other specified acquired hypothyroidism   . Restless leg     Past Surgical History:  Procedure Laterality Date  . ABDOMINAL HYSTERECTOMY  1993   Uterine Prolapse  . MASTECTOMY  1974  . Spleenectomy  2000    No current facility-administered medications for this encounter.    Current Outpatient Medications  Medication Sig Dispense Refill Last Dose  . bimatoprost (LUMIGAN) 0.03 % ophthalmic solution 1 drop at bedtime.   Taking  . furosemide (LASIX) 40 MG tablet Take 40 mg by mouth daily.   Not Taking  . levothyroxine  (SYNTHROID, LEVOTHROID) 75 MCG tablet Take 1 tablet (75 mcg total) by mouth daily before breakfast. 90 tablet 3   . lisinopril-hydrochlorothiazide (PRINZIDE,ZESTORETIC) 20-25 MG per tablet Take 1 tablet by mouth every morning. 90 tablet 3   . metFORMIN (GLUCOPHAGE-XR) 500 MG 24 hr tablet Take 2 tablets (1,000 mg total) by mouth at bedtime. 180 tablet 1   . metoprolol succinate (TOPROL-XL) 25 MG 24 hr tablet Take 1 tablet (25 mg total) by mouth every evening. 90 tablet 3   . pantoprazole (PROTONIX) 40 MG tablet Take 1 tablet (40 mg total) by mouth daily. 90 tablet 3   . pramipexole (MIRAPEX) 1 MG tablet Take 1 tablet (1 mg total) by mouth every evening. Take up to 2 pills per day 90 tablet 3   . timolol (TIMOPTIC) 0.5 % ophthalmic solution    Taking   Allergies  Allergen Reactions  . Arthrotec [Diclofenac-Misoprostol] Diarrhea    Shortness of breath  . Celebrex [Celecoxib] Other (See Comments)    GI Bleeding    Social History   Tobacco Use  . Smoking status: Former Smoker    Types: Cigarettes  . Smokeless tobacco: Never Used  Substance Use Topics  . Alcohol use: Yes    Alcohol/week: 2.0 standard drinks    Types: 2 drink(s) per week    Comment: Wine    Family History  Problem Relation Age of Onset  . Cancer Mother   . Heart disease Father   . Hyperlipidemia Father      Review of Systems  Constitutional: Negative for chills and fever.  HENT: Negative  for congestion, sore throat and tinnitus.   Eyes: Negative for double vision, photophobia and pain.  Respiratory: Negative for cough, shortness of breath and wheezing.   Cardiovascular: Negative for chest pain, palpitations and orthopnea.  Gastrointestinal: Negative for heartburn, nausea and vomiting.  Genitourinary: Negative for dysuria, frequency and urgency.  Musculoskeletal: Positive for joint pain.  Neurological: Negative for dizziness, weakness and headaches.    Objective:  Physical Exam  Well nourished and well  developed.  General: Alert and oriented x3, cooperative and pleasant, no acute distress.  Head: normocephalic, atraumatic, neck supple.  Eyes: EOMI.  Respiratory: breath sounds clear in all fields, no wheezing, rales, or rhonchi. Cardiovascular: Regular rate and rhythm, no murmurs, gallops or rubs.  Abdomen: non-tender to palpation and soft, normoactive bowel sounds. Musculoskeletal: Right Hip Exam: ROM: Flexion to 90, Internal Rotation 0, External Rotation 0, and Abduction 20 degrees. There is no tenderness over the greater trochanter bursa.  Calves soft and nontender. Motor function intact in LE. Strength 5/5 LE bilaterally. Neuro: Distal pulses 2+. Sensation to light touch intact in LE.  Vital signs in last 24 hours: Blood pressure: 150/90 mmHg Pulse: 68 bpm  Labs:   Estimated body mass index is 33.12 kg/m as calculated from the following:   Height as of 11/12/12: 4\' 11"  (1.499 m).   Weight as of 05/13/13: 74.4 kg.   Imaging Review Plain radiographs demonstrate severe degenerative joint disease of the right hip(s). The bone quality appears to be adequate for age and reported activity level.    Preoperative templating of the joint replacement has been completed, documented, and submitted to the Operating Room personnel in order to optimize intra-operative equipment management.     Assessment/Plan:  End stage arthritis, right hip(s)  The patient history, physical examination, clinical judgement of the provider and imaging studies are consistent with end stage degenerative joint disease of the right hip(s) and total hip arthroplasty is deemed medically necessary. The treatment options including medical management, injection therapy, arthroscopy and arthroplasty were discussed at length. The risks and benefits of total hip arthroplasty were presented and reviewed. The risks due to aseptic loosening, infection, stiffness, dislocation/subluxation,  thromboembolic complications and  other imponderables were discussed.  The patient acknowledged the explanation, agreed to proceed with the plan and consent was signed. Patient is being admitted for inpatient treatment for surgery, pain control, PT, OT, prophylactic antibiotics, VTE prophylaxis, progressive ambulation and ADL's and discharge planning.The patient is planning to be discharged home.   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 - Follow-up visit in 2 weeks with Dr. Wynelle Link - Begin physical therapy following surgery - Pre-operative lab work as pre-surgical testing - Prescriptions will be provided in hospital at time of discharge  Dana Duty, [facility] [place]

## 2018-02-12 NOTE — Patient Instructions (Signed)
Dana Bradford  02/12/2018   Your procedure is scheduled on: Wednesday 02/28/2018  Report to [facility] Main  Entrance              Report to admitting at  1115  AM    Call this number if you have problems the morning of surgery [phone]    Remember: Do not eat food  :After Midnight. May have clear liquids from midnight  up until 0745 am then nothing until after surgery!     CLEAR LIQUID DIET   Foods Allowed                                                                     Foods Excluded  Coffee and tea, regular and decaf                             liquids that you cannot  Plain Jell-O in any flavor                                             see through such as: Fruit ices (not with fruit pulp)                                     milk, soups, orange juice  Iced Popsicles                                    All solid food Carbonated beverages, regular and diet                                    Cranberry, grape and apple juices Sports drinks like Gatorade Lightly seasoned clear broth or consume(fat free) Sugar, honey syrup  Sample Menu Breakfast                                Lunch                                     Supper Cranberry juice                    Beef broth                            Chicken broth Jell-O                                     Grape juice  Apple juice Coffee or tea                        Jell-O                                      Popsicle                                                Coffee or tea                        Coffee or tea  _____________________________________________________________________ How to Manage Your Diabetes Before and After Surgery  Why is it important to control my blood sugar before and after surgery? . Improving blood sugar levels before and after surgery helps healing and can limit problems. . A way of improving blood sugar control is eating a healthy diet by: o   Eating less sugar and carbohydrates o  Increasing activity/exercise o  Talking with your doctor about reaching your blood sugar goals . High blood sugars (greater than 180 mg/dL) can raise your risk of infections and slow your recovery, so you will need to focus on controlling your diabetes during the weeks before surgery. . Make sure that the doctor who takes care of your diabetes knows about your planned surgery including the date and location.  How do I manage my blood sugar before surgery? . Check your blood sugar at least 4 times a day, starting 2 days before surgery, to make sure that the level is not too high or low. o Check your blood sugar the morning of your surgery when you wake up and every 2 hours until you get to the Short Stay unit. . If your blood sugar is less than 70 mg/dL, you will need to treat for low blood sugar: o Do not take insulin. o Treat a low blood sugar (less than 70 mg/dL) with  cup of clear juice (cranberry or apple), 4 glucose tablets, OR glucose gel. o Recheck blood sugar in 15 minutes after treatment (to make sure it is greater than 70 mg/dL). If your blood sugar is not greater than 70 mg/dL on recheck, call [phone] for further instructions. . Report your blood sugar to the short stay nurse when you get to Short Stay.  . If you are admitted to the hospital after surgery: o Your blood sugar will be checked by the staff and you will probably be given insulin after surgery (instead of oral diabetes medicines) to make sure you have good blood sugar levels. o The goal for blood sugar control after surgery is 80-180 mg/dL.   WHAT DO I DO ABOUT MY DIABETES MEDICATION?       The day before surgery, Take Metformin as usual.  . Do not take oral diabetes medicines (pills) the morning of surgery.                  BRUSH YOUR TEETH MORNING OF SURGERY AND RINSE YOUR MOUTH OUT, NO CHEWING GUM CANDY OR MINTS.     Take these medicines the morning of surgery with A  SIP OF WATER: Levothyroxine (Synthroid), Pantoprazole (Protonix)   DO NOT TAKE ANY DIABETIC MEDICATIONS DAY OF YOUR SURGERY!  You may not have any metal on your body including hair pins and              piercings  Do not wear jewelry, make-up, lotions, powders or perfumes, deodorant             Do not wear nail polish.  Do not shave  48 hours prior to surgery.           Do not bring valuables to the hospital. [place].  Contacts, dentures or bridgework may not be worn into surgery.  Leave suitcase in the car. After surgery it may be brought to your room.                  Please read over the following fact sheets you were given: _____________________________________________________________________             [facility] - Preparing for Surgery Before surgery, you can play an important role.  Because skin is not sterile, your skin needs to be as free of germs as possible.  You can reduce the number of germs on your skin by washing with CHG (chlorahexidine gluconate) soap before surgery.  CHG is an antiseptic cleaner which kills germs and bonds with the skin to continue killing germs even after washing. Please DO NOT use if you have an allergy to CHG or antibacterial soaps.  If your skin becomes reddened/irritated stop using the CHG and inform your nurse when you arrive at Short Stay. Do not shave (including legs and underarms) for at least 48 hours prior to the first CHG shower.  You may shave your face/neck. Please follow these instructions carefully:  1.  Shower with CHG Soap the night before surgery and the  morning of Surgery.  2.  If you choose to wash your hair, wash your hair first as usual with your  normal  shampoo.  3.  After you shampoo, rinse your hair and body thoroughly to remove the  shampoo.                           4.  Use CHG as you would any other liquid soap.  You can apply chg directly   to the skin and wash                       Gently with a scrungie or clean washcloth.  5.  Apply the CHG Soap to your body ONLY FROM THE NECK DOWN.   Do not use on face/ open                           Wound or open sores. Avoid contact with eyes, ears mouth and genitals (private parts).                       Wash face,  Genitals (private parts) with your normal soap.             6.  Wash thoroughly, paying special attention to the area where your surgery  will be performed.  7.  Thoroughly rinse your body with warm water from the neck down.  8.  DO NOT shower/wash with your normal soap after using and rinsing off  the CHG Soap.  9.  Pat yourself dry with a clean towel.            10.  Wear clean pajamas.            11.  Place clean sheets on your bed the night of your first shower and do not  sleep with pets. Day of Surgery : Do not apply any lotions/deodorants the morning of surgery.  Please wear clean clothes to the hospital/surgery center.  FAILURE TO FOLLOW THESE INSTRUCTIONS MAY RESULT IN THE CANCELLATION OF YOUR SURGERY PATIENT SIGNATURE_________________________________  NURSE SIGNATURE__________________________________  ________________________________________________________________________   Adam Phenix  An incentive spirometer is a tool that can help keep your lungs clear and active. This tool measures how well you are filling your lungs with each breath. Taking long deep breaths may help reverse or decrease the chance of developing breathing (pulmonary) problems (especially infection) following:  A long period of time when you are unable to move or be active. BEFORE THE PROCEDURE   If the spirometer includes an indicator to show your best effort, your nurse or respiratory therapist will set it to a desired goal.  If possible, sit up straight or lean slightly forward. Try not to slouch.  Hold the incentive spirometer in an upright  position. INSTRUCTIONS FOR USE  1. Sit on the edge of your bed if possible, or sit up as far as you can in bed or on a chair. 2. Hold the incentive spirometer in an upright position. 3. Breathe out normally. 4. Place the mouthpiece in your mouth and seal your lips tightly around it. 5. Breathe in slowly and as deeply as possible, raising the piston or the ball toward the top of the column. 6. Hold your breath for 3-5 seconds or for as long as possible. Allow the piston or ball to fall to the bottom of the column. 7. Remove the mouthpiece from your mouth and breathe out normally. 8. Rest for a few seconds and repeat Steps 1 through 7 at least 10 times every 1-2 hours when you are awake. Take your time and take a few normal breaths between deep breaths. 9. The spirometer may include an indicator to show your best effort. Use the indicator as a goal to work toward during each repetition. 10. After each set of 10 deep breaths, practice coughing to be sure your lungs are clear. If you have an incision (the cut made at the time of surgery), support your incision when coughing by placing a pillow or rolled up towels firmly against it. Once you are able to get out of bed, walk around indoors and cough well. You may stop using the incentive spirometer when instructed by your caregiver.  RISKS AND COMPLICATIONS  Take your time so you do not get dizzy or light-headed.  If you are in pain, you may need to take or ask for pain medication before doing incentive spirometry. It is harder to take a deep breath if you are having pain. AFTER USE  Rest and breathe slowly and easily.  It can be helpful to keep track of a log of your progress. Your caregiver can provide you with a simple table to help with this. If you are using the spirometer at home, follow these instructions: [place] IF:   You are having difficultly using the spirometer.  You have trouble using the spirometer as often as  instructed.  Your pain medication is not giving enough relief while using the spirometer.  You  develop fever of 100.5 F (38.1 C) or higher. SEEK IMMEDIATE MEDICAL CARE IF:   You cough up bloody sputum that had not been present before.  You develop fever of 102 F (38.9 C) or greater.  You develop worsening pain at or near the incision site. MAKE SURE YOU:   Understand these instructions.  Will watch your condition.  Will get help right away if you are not doing well or get worse. Document Released: 06/20/2006 Document Revised: 05/02/2011 Document Reviewed: 08/21/2006 [employer] Patient Information 2014 [employer], [place].   ________________________________________________________________________  WHAT IS A BLOOD TRANSFUSION? Blood Transfusion Information  A transfusion is the replacement of blood or some of its parts. Blood is made up of multiple cells which provide different functions.  Red blood cells carry oxygen and are used for blood loss replacement.  White blood cells fight against infection.  Platelets control bleeding.  Plasma helps clot blood.  Other blood products are available for specialized needs, such as hemophilia or other clotting disorders. BEFORE THE TRANSFUSION  Who gives blood for transfusions?   Healthy volunteers who are fully evaluated to make sure their blood is safe. This is blood bank blood. Transfusion therapy is the safest it has ever been in the practice of medicine. Before blood is taken from a donor, a complete history is taken to make sure that person has no history of diseases nor engages in risky social behavior (examples are intravenous drug use or sexual activity with multiple partners). The donor's travel history is screened to minimize risk of transmitting infections, such as malaria. The donated blood is tested for signs of infectious diseases, such as HIV and hepatitis. The blood is then tested to be sure it is compatible with you in  order to minimize the chance of a transfusion reaction. If you or a relative donates blood, this is often done in anticipation of surgery and is not appropriate for emergency situations. It takes many days to process the donated blood. RISKS AND COMPLICATIONS Although transfusion therapy is very safe and saves many lives, the main dangers of transfusion include:   Getting an infectious disease.  Developing a transfusion reaction. This is an allergic reaction to something in the blood you were given. Every precaution is taken to prevent this. The decision to have a blood transfusion has been considered carefully by your caregiver before blood is given. Blood is not given unless the benefits outweigh the risks. AFTER THE TRANSFUSION  Right after receiving a blood transfusion, you will usually feel much better and more energetic. This is especially true if your red blood cells have gotten low (anemic). The transfusion raises the level of the red blood cells which carry oxygen, and this usually causes an energy increase.  The nurse administering the transfusion will monitor you carefully for complications. HOME CARE INSTRUCTIONS  No special instructions are needed after a transfusion. You may find your energy is better. Speak with your caregiver about any limitations on activity for underlying diseases you may have. SEEK MEDICAL CARE IF:   Your condition is not improving after your transfusion.  You develop redness or irritation at the intravenous (IV) site. SEEK IMMEDIATE MEDICAL CARE IF:  Any of the following symptoms occur over the next 12 hours:  Shaking chills.  You have a temperature by mouth above 102 F (38.9 C), not controlled by medicine.  Chest, back, or muscle pain.  People around you feel you are not acting correctly or are confused.  Shortness of breath  or difficulty breathing.  Dizziness and fainting.  You get a rash or develop hives.  You have a decrease in urine  output.  Your urine turns a dark color or changes to pink, red, or brown. Any of the following symptoms occur over the next 10 days:  You have a temperature by mouth above 102 F (38.9 C), not controlled by medicine.  Shortness of breath.  Weakness after normal activity.  The white part of the eye turns yellow (jaundice).  You have a decrease in the amount of urine or are urinating less often.  Your urine turns a dark color or changes to pink, red, or brown. Document Released: 02/05/2000 Document Revised: 05/02/2011 Document Reviewed: 09/24/2007 [facility] Patient Information 2014 [place], [place].  _______________________________________________________________________

## 2018-02-12 NOTE — Progress Notes (Signed)
12/28/2017- EKG from Dr. Dion Saucier on chart  12/06/2017- Medical Clearance from Dr. Ival Bible on chart.  09/27/2017- on chart from Dr. Vear Clock, HgA1C, TSH 3rd generation

## 2018-02-15 ENCOUNTER — Inpatient Hospital Stay (HOSPITAL_COMMUNITY)
Admission: RE | Admit: 2018-02-15 | Discharge: 2018-02-15 | Disposition: A | Discharge status: Home / Self Care | Payer: PRIVATE HEALTH INSURANCE | Source: Ambulatory Visit

## 2018-02-22 ENCOUNTER — Other Ambulatory Visit (HOSPITAL_COMMUNITY): Payer: Self-pay | Admitting: *Deleted

## 2018-02-22 NOTE — Patient Instructions (Addendum)
Dana Bradford  02/22/2018   Your procedure is scheduled on: 02-28-18  Report to [facility] Main  Entrance  Report to admitting at 1115 AM    Call this number if you have problems the morning of surgery [phone]   Remember: Do not eat food  :After Midnight Clear liquids from midnight until 745 am day of surgery, then nothing by mouth after 745 am.. Dana Bradford, NO CHEWING GUM Dana Bradford.      CLEAR LIQUID DIET   Foods Allowed                                                                     Foods Excluded  Coffee and tea, regular and decaf                             liquids that you cannot  Plain Jell-O in any flavor                                             see through such as: Fruit ices (not with fruit pulp)                                     milk, soups, orange juice  Iced Popsicles                                    All solid food Carbonated beverages, regular and diet                                    Cranberry, grape and apple juices Sports drinks like Gatorade Lightly seasoned clear broth or consume(fat free) Sugar, honey syrup  Sample Menu Breakfast                                Lunch                                     Supper Cranberry juice                    Beef broth                            Chicken broth Jell-O                                     Grape juice  Apple juice Coffee or tea                        Jell-O                                      Popsicle                                                Coffee or tea                        Coffee or tea  _____________________________________________________________________ How to Manage Your Diabetes Before and After Surgery  Why is it important to control my blood sugar before and after surgery? . Improving blood sugar levels before and after surgery helps healing and can limit problems. . A way  of improving blood sugar control is eating a healthy diet by: o  Eating less sugar and carbohydrates o  Increasing activity/exercise o  Talking with your doctor about reaching your blood sugar goals . High blood sugars (greater than 180 mg/dL) can raise your risk of infections and slow your recovery, so you will need to focus on controlling your diabetes during the weeks before surgery. . Make sure that the doctor who takes care of your diabetes knows about your planned surgery including the date and location.  How do I manage my blood sugar before surgery? . Check your blood sugar at least 4 times a day, starting 2 days before surgery, to make sure that the level is not too high or low. o Check your blood sugar the morning of your surgery when you wake up and every 2 hours until you get to the Short Stay unit. . If your blood sugar is less than 70 mg/dL, you will need to treat for low blood sugar: o Do not take insulin. o Treat a low blood sugar (less than 70 mg/dL) with  cup of clear juice (cranberry or apple), 4 glucose tablets, OR glucose gel. o Recheck blood sugar in 15 minutes after treatment (to make sure it is greater than 70 mg/dL). If your blood sugar is not greater than 70 mg/dL on recheck, call [phone] for further instructions. . Report your blood sugar to the short stay nurse when you get to Short Stay.  . If you are admitted to the hospital after surgery: o Your blood sugar will be checked by the staff and you will probably be given insulin after surgery (instead of oral diabetes medicines) to make sure you have good blood sugar levels. o The goal for blood sugar control after surgery is 80-180 mg/dL.   WHAT DO I DO ABOUT MY DIABETES MEDICATION?  Marland Kitchen Do not take oral diabetes medicines (pills) the morning of surgery.  . THE NIGHT BEFORE SURGERY TAKE YOUR METFORMIN AS USUAL      . THE MORNING OF SURGERY DO NOT TAKE YOUR METFORMIN.  Marland Kitchen     Patient  Signature:  Date:   Nurse Signature:  Date:   Reviewed and Endorsed by [facility] Patient Education Committee, August 2015   Take these medicines the morning of surgery with A SIP OF WATER: LEVOTHYROXINE (SYNTHROID), PANTAPRAZOLE, EYE DROPS (PROTONIX) DO NOT TAKE ANY DIABETIC  MEDICATIONS DAY OF YOUR SURGERY                               You may not have any metal on your body including hair pins and              piercings  Do not wear jewelry, make-up, lotions, powders or perfumes, deodorant             Do not wear nail polish.  Do not shave  48 hours prior to surgery.              Men may shave face and neck.   Do not bring valuables to the hospital. Dana Bradford.  Contacts, dentures or bridgework may not be worn into surgery.  Leave suitcase in the car. After surgery it may be brought to your room.                Please read over the following fact sheets you were given: _____________________________________________________________________  [facility] - Preparing for Surgery Before surgery, you can play an important role.  Because skin is not sterile, your skin needs to be as free of germs as possible.  You can reduce the number of germs on your skin by washing with CHG (chlorahexidine gluconate) soap before surgery.  CHG is an antiseptic cleaner which kills germs and bonds with the skin to continue killing germs even after washing. Please DO NOT use if you have an allergy to CHG or antibacterial soaps.  If your skin becomes reddened/irritated stop using the CHG and inform your nurse when you arrive at Short Stay. Do not shave (including legs and underarms) for at least 48 hours prior to the first CHG shower.  You may shave your face/neck. Please follow these instructions carefully:  1.  Shower with CHG Soap the night before surgery and the  morning of Surgery.  2.  If you choose to wash your hair, wash your hair first as usual with your   normal  shampoo.  3.  After you shampoo, rinse your hair and body thoroughly to remove the  shampoo.                           4.  Use CHG as you would any other liquid soap.  You can apply chg directly  to the skin and wash                       Gently with a scrungie or clean washcloth.  5.  Apply the CHG Soap to your body ONLY FROM THE NECK DOWN.   Do not use on face/ open                           Wound or open sores. Avoid contact with eyes, ears mouth and genitals (private parts).                       Wash face,  Genitals (private parts) with your normal soap.             6.  Wash thoroughly, paying special attention to the area where your surgery  will be performed.  7.  Thoroughly  rinse your body with warm water from the neck down.  8.  DO NOT shower/wash with your normal soap after using and rinsing off  the CHG Soap.                9.  Pat yourself dry with a clean towel.            10.  Wear clean pajamas.            11.  Place clean sheets on your bed the night of your first shower and do not  sleep with pets. Day of Surgery : Do not apply any lotions/deodorants the morning of surgery.  Please wear clean clothes to the hospital/surgery center.  FAILURE TO FOLLOW THESE INSTRUCTIONS MAY RESULT IN THE CANCELLATION OF YOUR SURGERY PATIENT SIGNATURE_________________________________  NURSE SIGNATURE__________________________________  ________________________________________________________________________   Adam Phenix  An incentive spirometer is a tool that can help keep your lungs clear and active. This tool measures how well you are filling your lungs with each breath. Taking long deep breaths may help reverse or decrease the chance of developing breathing (pulmonary) problems (especially infection) following:  A long period of time when you are unable to move or be active. BEFORE THE PROCEDURE   If the spirometer includes an indicator to show your best effort, your  nurse or respiratory therapist will set it to a desired goal.  If possible, sit up straight or lean slightly forward. Try not to slouch.  Hold the incentive spirometer in an upright position. INSTRUCTIONS FOR USE  1. Sit on the edge of your bed if possible, or sit up as far as you can in bed or on a chair. 2. Hold the incentive spirometer in an upright position. 3. Breathe out normally. 4. Place the mouthpiece in your mouth and seal your lips tightly around it. 5. Breathe in slowly and as deeply as possible, raising the piston or the ball toward the top of the column. 6. Hold your breath for 3-5 seconds or for as long as possible. Allow the piston or ball to fall to the bottom of the column. 7. Remove the mouthpiece from your mouth and breathe out normally. 8. Rest for a few seconds and repeat Steps 1 through 7 at least 10 times every 1-2 hours when you are awake. Take your time and take a few normal breaths between deep breaths. 9. The spirometer may include an indicator to show your best effort. Use the indicator as a goal to work toward during each repetition. 10. After each set of 10 deep breaths, practice coughing to be sure your lungs are clear. If you have an incision (the cut made at the time of surgery), support your incision when coughing by placing a pillow or rolled up towels firmly against it. Once you are able to get out of bed, walk around indoors and cough well. You may stop using the incentive spirometer when instructed by your caregiver.  RISKS AND COMPLICATIONS  Take your time so you do not get dizzy or light-headed.  If you are in pain, you may need to take or ask for pain medication before doing incentive spirometry. It is harder to take a deep breath if you are having pain. AFTER USE  Rest and breathe slowly and easily.  It can be helpful to keep track of a log of your progress. Your caregiver can provide you with a simple table to help with this. If you are using the  spirometer  at home, follow these instructions: [place] IF:   You are having difficultly using the spirometer.  You have trouble using the spirometer as often as instructed.  Your pain medication is not giving enough relief while using the spirometer.  You develop fever of 100.5 F (38.1 C) or higher. SEEK IMMEDIATE MEDICAL CARE IF:   You cough up bloody sputum that had not been present before.  You develop fever of 102 F (38.9 C) or greater.  You develop worsening pain at or near the incision site. MAKE SURE YOU:   Understand these instructions.  Will watch your condition.  Will get help right away if you are not doing well or get worse. Document Released: 06/20/2006 Document Revised: 05/02/2011 Document Reviewed: 08/21/2006 ExitCare Patient Information 2014 ExitCare, [place].   ________________________________________________________________________  WHAT IS A BLOOD TRANSFUSION? Blood Transfusion Information  A transfusion is the replacement of blood or some of its parts. Blood is made up of multiple cells which provide different functions.  Red blood cells carry oxygen and are used for blood loss replacement.  White blood cells fight against infection.  Platelets control bleeding.  Plasma helps clot blood.  Other blood products are available for specialized needs, such as hemophilia or other clotting disorders. BEFORE THE TRANSFUSION  Who gives blood for transfusions?   Healthy volunteers who are fully evaluated to make sure their blood is safe. This is blood bank blood. Transfusion therapy is the safest it has ever been in the practice of medicine. Before blood is taken from a donor, a complete history is taken to make sure that person has no history of diseases nor engages in risky social behavior (examples are intravenous drug use or sexual activity with multiple partners). The donor's travel history is screened to minimize risk of transmitting  infections, such as malaria. The donated blood is tested for signs of infectious diseases, such as HIV and hepatitis. The blood is then tested to be sure it is compatible with you in order to minimize the chance of a transfusion reaction. If you or a relative donates blood, this is often done in anticipation of surgery and is not appropriate for emergency situations. It takes many days to process the donated blood. RISKS AND COMPLICATIONS Although transfusion therapy is very safe and saves many lives, the main dangers of transfusion include:   Getting an infectious disease.  Developing a transfusion reaction. This is an allergic reaction to something in the blood you were given. Every precaution is taken to prevent this. The decision to have a blood transfusion has been considered carefully by your caregiver before blood is given. Blood is not given unless the benefits outweigh the risks. AFTER THE TRANSFUSION  Right after receiving a blood transfusion, you will usually feel much better and more energetic. This is especially true if your red blood cells have gotten low (anemic). The transfusion raises the level of the red blood cells which carry oxygen, and this usually causes an energy increase.  The nurse administering the transfusion will monitor you carefully for complications. HOME CARE INSTRUCTIONS  No special instructions are needed after a transfusion. You may find your energy is better. Speak with your caregiver about any limitations on activity for underlying diseases you may have. SEEK MEDICAL CARE IF:   Your condition is not improving after your transfusion.  You develop redness or irritation at the intravenous (IV) site. SEEK IMMEDIATE MEDICAL CARE IF:  Any of the following symptoms occur over the next  12 hours:  Shaking chills.  You have a temperature by mouth above 102 F (38.9 C), not controlled by medicine.  Chest, back, or muscle pain.  People around you feel you are  not acting correctly or are confused.  Shortness of breath or difficulty breathing.  Dizziness and fainting.  You get a rash or develop hives.  You have a decrease in urine output.  Your urine turns a dark color or changes to pink, red, or brown. Any of the following symptoms occur over the next 10 days:  You have a temperature by mouth above 102 F (38.9 C), not controlled by medicine.  Shortness of breath.  Weakness after normal activity.  The white part of the eye turns yellow (jaundice).  You have a decrease in the amount of urine or are urinating less often.  Your urine turns a dark color or changes to pink, red, or brown. Document Released: 02/05/2000 Document Revised: 05/02/2011 Document Reviewed: 09/24/2007 [facility] Patient Information 2014 [place], [place].  _______________________________________________________________________

## 2018-02-26 ENCOUNTER — Other Ambulatory Visit: Payer: Self-pay

## 2018-02-26 ENCOUNTER — Encounter (HOSPITAL_COMMUNITY): Payer: Self-pay

## 2018-02-26 ENCOUNTER — Encounter (HOSPITAL_COMMUNITY)
Admission: RE | Admit: 2018-02-26 | Discharge: 2018-02-26 | Disposition: A | Discharge status: Home / Self Care | Payer: Medicare Other | Source: Ambulatory Visit | Attending: Orthopedic Surgery | Admitting: Orthopedic Surgery

## 2018-02-26 DIAGNOSIS — M1611 Unilateral primary osteoarthritis, right hip: Secondary | ICD-10-CM

## 2018-02-26 DIAGNOSIS — Z01812 Encounter for preprocedural laboratory examination: Secondary | ICD-10-CM

## 2018-02-26 HISTORY — DX: Unspecified glaucoma: H40.9

## 2018-02-26 HISTORY — DX: Acute kidney failure, unspecified: N17.9

## 2018-02-26 HISTORY — DX: Unspecified osteoarthritis, unspecified site: M19.90

## 2018-02-26 HISTORY — DX: Cerebral infarction, unspecified: I63.9

## 2018-02-26 LAB — CBC
HEMATOCRIT: 39.2 % (ref 36.0–46.0)
HEMOGLOBIN: 12.6 g/dL (ref 12.0–15.0)
MCH: 32.1 pg (ref 26.0–34.0)
MCHC: 32.1 g/dL (ref 30.0–36.0)
MCV: 100 fL (ref 80.0–100.0)
Platelets: 248 10*3/uL (ref 150–400)
RBC: 3.92 MIL/uL (ref 3.87–5.11)
RDW: 12.5 % (ref 11.5–15.5)
WBC: 8.1 10*3/uL (ref 4.0–10.5)
nRBC: 0 % (ref 0.0–0.2)

## 2018-02-26 LAB — SURGICAL PCR SCREEN
MRSA, PCR: NEGATIVE
Staphylococcus aureus: NEGATIVE

## 2018-02-26 LAB — HEMOGLOBIN A1C
HEMOGLOBIN A1C: 5.7 % — AB (ref 4.8–5.6)
MEAN PLASMA GLUCOSE: 116.89 mg/dL

## 2018-02-26 LAB — PROTIME-INR
INR: 0.85
Prothrombin Time: 11.5 seconds (ref 11.4–15.2)

## 2018-02-26 LAB — COMPREHENSIVE METABOLIC PANEL
ALT: 19 U/L (ref 0–44)
AST: 22 U/L (ref 15–41)
Albumin: 4 g/dL (ref 3.5–5.0)
Alkaline Phosphatase: 66 U/L (ref 38–126)
Anion gap: 10 (ref 5–15)
BILIRUBIN TOTAL: 0.7 mg/dL (ref 0.3–1.2)
BUN: 35 mg/dL — ABNORMAL HIGH (ref 8–23)
CO2: 28 mmol/L (ref 22–32)
Calcium: 9.2 mg/dL (ref 8.9–10.3)
Chloride: 102 mmol/L (ref 98–111)
Creatinine, Ser: 1.04 mg/dL — ABNORMAL HIGH (ref 0.44–1.00)
GFR calc Af Amer: 57 mL/min — ABNORMAL LOW (ref >60)
GFR calc non Af Amer: 49 mL/min — ABNORMAL LOW (ref >60)
Glucose, Bld: 112 mg/dL — ABNORMAL HIGH (ref 70–99)
Potassium: 3.9 mmol/L (ref 3.5–5.1)
Sodium: 140 mmol/L (ref 135–145)
Total Protein: 7.3 g/dL (ref 6.5–8.1)

## 2018-02-26 LAB — APTT: aPTT: 28 seconds (ref 24–36)

## 2018-02-26 LAB — ABO/RH: ABO/RH(D): A POS

## 2018-02-26 LAB — GLUCOSE, CAPILLARY: Glucose-Capillary: 102 mg/dL — ABNORMAL HIGH (ref 70–99)

## 2018-02-26 NOTE — Progress Notes (Addendum)
MEDICAL CLEARANCE DR Dion Saucier 12-06-17 ON CHART PATIENT TO STOP PLAVIX 7 DAYS PER DR ZANARD INSTRUCTIONS

## 2018-02-26 NOTE — Progress Notes (Signed)
PCP: dr Bailey Mech zanard [facility]  CARDIOLOGIST:none  INFO IN Epic: has clearance dr zanard   INFO ON CHART: cmet results  BLOOD THINNERS AND LAST DOSES: patient instructed to stop plavix 7 days before surgery per dr Wynelle Link ____________________________________  PATIENT SYMPTOMS AT TIME OF PREOP: none

## 2018-02-27 NOTE — Progress Notes (Signed)
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 Wynelle Link.   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  She is on Plavix indefinitely.  MRI brain without infarct, carotid doppler with no significant stenosis.  Echo done at this time with mild LVH, no emboli.  She has no neurologic symptoms.  Some vision loss right eye.  Last seen by neurology, Dr. Chales Salmon, 07/20/17.  Office visit note in [place].   Clearance received from PCP, Dr. Ival Bible on 11/29/17 which states patient is at moderate risk due to age.  She will hold her Plavix for 7 days per PCP.    Since clearance pt developed cellulitis to left lower extremity with a slow healing wound.  She was seen in the ED for IV abx.  Called Dr. Yvone Neu office to get an update on healing wound and to confirm she is still cleared for surgery.  Per his nurse pt lower extremity cellulitis has resolved.  She is cleared for procedure.  Pt can proceed with planned procedure barring acute status change.  VS: BP (!) 146/88   Pulse 70   Temp 36.7 C (Oral)   Resp 18   Ht 4\' 10"  (1.473 m)   Wt 72.6 kg   BMI 33.44 kg/m   PROVIDERS: Jonathon Resides, MD is PCP  Chales Salmon, MD is Neurologist  LABS: Labs reviewed: Acceptable for surgery. (all labs ordered are listed, but only abnormal results are displayed)  Labs Reviewed  COMPREHENSIVE  METABOLIC PANEL - Abnormal; Notable for the following components:      Result Value   Glucose, Bld 112 (*)    BUN 35 (*)    Creatinine, Ser 1.04 (*)    GFR calc non Af Amer 49 (*)    GFR calc Af Amer 57 (*)    All other components within normal limits  HEMOGLOBIN A1C - Abnormal; Notable for the following components:   Hgb A1c MFr Bld 5.7 (*)    All other components within normal limits  GLUCOSE, CAPILLARY - Abnormal; Notable for the following components:   Glucose-Capillary 102 (*)    All other components within normal limits  SURGICAL PCR SCREEN  APTT  CBC  PROTIME-INR  TYPE AND SCREEN  ABO/RH     IMAGES: MRI Brain 06/20/17  IMPRESSION: No acute infarct. Atrophy and chronic ischemic changes.  EKG: 12/28/2017 (on chart) Rate 65 Sinus rhythm 1st degree AV block  CV: Echo 06/26/17 (Care Everywhere) SUMMARY The left ventricular size is normal. Mild left ventricular hypertrophy  Left ventricular systolic function is normal. LV ejection fraction = 55-60%. The right ventricle is normal size. The right ventricular systolic function is normal. The left atrium is moderately dilated. No significant stenosis or regurgitation seen There was insufficient TR detected to calculate RV systolic pressure. Estimated right atrial pressure is 10 mmHg.Marland Kitchen There is no  pericardial effusion. There is no comparison study available.  Carotid Doppler 06/09/17 (Care Everywhere):  IMPRESSION: Minor carotid atherosclerosis. No hemodynamically significant ICA stenosis. Degree of narrowing less than 50% bilaterally by ultrasound criteria.  Patent antegrade vertebral flow bilaterally  Past Medical History:  Diagnosis Date  . Acute kidney failure ([place]) 2016 to 2017 due to vacomycin   no kidney issues now  . Arthritis   . Food allergy    Fish (Hives)   . Glaucoma   . Hypertension   . Impaired fasting glucose   . Other specified acquired hypothyroidism   . Restless leg   . Restless leg    . Stroke ([place]) 05/2017   ocular stoke on plavix , right eye some vision loss    Past Surgical History:  Procedure Laterality Date  . ABDOMINAL HYSTERECTOMY  1993   Uterine Prolapse partial  . MASTECTOMY  1974   right breast not sure if cancer 1960's  . SMALL TOE RIGHT FOOT REMOVED  04/2017   DUE TO INFECTION  . Spleenectomy  2000    MEDICATIONS: . acetaminophen (TYLENOL) 500 MG tablet  . brimonidine (ALPHAGAN) 0.2 % ophthalmic solution  . Cholecalciferol (VITAMIN D3) 50 MCG (2000 UT) TABS  . clopidogrel (PLAVIX) 75 MG tablet  . ferrous sulfate 325 (65 FE) MG tablet  . furosemide (LASIX) 40 MG tablet  . irbesartan-hydrochlorothiazide (AVALIDE) 300-12.5 MG tablet  . latanoprost (XALATAN) 0.005 % ophthalmic solution  . levothyroxine (SYNTHROID, LEVOTHROID) 88 MCG tablet  . metFORMIN (GLUCOPHAGE-XR) 500 MG 24 hr tablet  . metoprolol succinate (TOPROL-XL) 50 MG 24 hr tablet  . Polyethyl Glycol-Propyl Glycol (SYSTANE) 0.4-0.3 % GEL ophthalmic gel  . pramipexole (MIRAPEX) 1 MG tablet  . simvastatin (ZOCOR) 20 MG tablet  . SYSTANE COMPLETE 0.6 % SOLN  . timolol (BETIMOL) 0.25 % ophthalmic solution  . triamcinolone ointment (KENALOG) 0.1 %  . vitamin C (ASCORBIC ACID) 500 MG tablet   No current facility-administered medications for this encounter.      [name] [facility] Pre-Surgical Testing [phone] 02/27/18 11:45 AM

## 2018-02-27 NOTE — Anesthesia Preprocedure Evaluation (Addendum)
Anesthesia Evaluation  Patient identified by MRN, date of birth, ID band Patient awake    Reviewed: Allergy & Precautions, NPO status , Patient's Chart, lab work & pertinent test results  History of Anesthesia Complications Negative for: history of anesthetic complications  Airway Mallampati: II  TM Distance: >3 FB Neck ROM: Full    Dental  (+) Dental Advisory Given   Pulmonary neg shortness of breath, neg sleep apnea, neg COPD, neg recent URI, former smoker,    breath sounds clear to auscultation       Cardiovascular hypertension, Pt. on medications (-) angina(-) Past MI and (-) CHF  Rhythm:Regular     Neuro/Psych neg Seizures CVA, No Residual Symptoms negative psych ROS   GI/Hepatic Neg liver ROS, GERD  ,  Endo/Other  Hypothyroidism   Renal/GU Renal disease     Musculoskeletal  (+) Arthritis ,   Abdominal   Peds  Hematology negative hematology ROS (+)   Anesthesia Other Findings   Reproductive/Obstetrics                            Anesthesia Physical Anesthesia Plan  ASA: III  Anesthesia Plan: MAC and Spinal   Post-op Pain Management:    Induction:   PONV Risk Score and Plan: 2 and Treatment may vary due to age or medical condition and Propofol infusion  Airway Management Planned: Nasal Cannula  Additional Equipment: None  Intra-op Plan:   Post-operative Plan:   Informed Consent: I have reviewed the patients History and Physical, chart, labs and discussed the procedure including the risks, benefits and alternatives for the proposed anesthesia with the patient or authorized representative who has indicated his/her understanding and acceptance.   Dental advisory given  Plan Discussed with: Surgeon and CRNA  Anesthesia Plan Comments: (See PST note 02/26/2018, Konrad Felix, PA-C)       Anesthesia Quick Evaluation

## 2018-02-28 ENCOUNTER — Inpatient Hospital Stay (HOSPITAL_COMMUNITY): Payer: Medicare Other

## 2018-02-28 ENCOUNTER — Encounter (HOSPITAL_COMMUNITY)
Admission: RE | Disposition: A | Discharge status: Home Health | Payer: Self-pay | Source: Home / Self Care | Attending: Orthopedic Surgery

## 2018-02-28 ENCOUNTER — Inpatient Hospital Stay (HOSPITAL_COMMUNITY): Payer: Medicare Other | Admitting: Certified Registered Nurse Anesthetist

## 2018-02-28 ENCOUNTER — Inpatient Hospital Stay (HOSPITAL_COMMUNITY): Payer: Medicare Other | Admitting: Physician Assistant

## 2018-02-28 ENCOUNTER — Other Ambulatory Visit: Payer: Self-pay

## 2018-02-28 ENCOUNTER — Inpatient Hospital Stay (HOSPITAL_COMMUNITY)
Admission: RE | Admit: 2018-02-28 | Discharge: 2018-03-02 | DRG: 470 | Disposition: A | Discharge status: Home Health | Payer: Medicare Other | Attending: Orthopedic Surgery | Admitting: Orthopedic Surgery

## 2018-02-28 ENCOUNTER — Encounter (HOSPITAL_COMMUNITY): Payer: Self-pay | Admitting: General Practice

## 2018-02-28 DIAGNOSIS — G2581 Restless legs syndrome: Secondary | ICD-10-CM | POA: Diagnosis present

## 2018-02-28 DIAGNOSIS — Z79890 Hormone replacement therapy (postmenopausal): Secondary | ICD-10-CM

## 2018-02-28 DIAGNOSIS — Z79899 Other long term (current) drug therapy: Secondary | ICD-10-CM | POA: Diagnosis not present

## 2018-02-28 DIAGNOSIS — Z9081 Acquired absence of spleen: Secondary | ICD-10-CM | POA: Diagnosis not present

## 2018-02-28 DIAGNOSIS — Z87891 Personal history of nicotine dependence: Secondary | ICD-10-CM | POA: Diagnosis not present

## 2018-02-28 DIAGNOSIS — Z7984 Long term (current) use of oral hypoglycemic drugs: Secondary | ICD-10-CM | POA: Diagnosis not present

## 2018-02-28 DIAGNOSIS — Z888 Allergy status to other drugs, medicaments and biological substances status: Secondary | ICD-10-CM | POA: Diagnosis not present

## 2018-02-28 DIAGNOSIS — M1611 Unilateral primary osteoarthritis, right hip: Principal | ICD-10-CM | POA: Diagnosis present

## 2018-02-28 DIAGNOSIS — R7301 Impaired fasting glucose: Secondary | ICD-10-CM | POA: Diagnosis present

## 2018-02-28 DIAGNOSIS — E038 Other specified hypothyroidism: Secondary | ICD-10-CM | POA: Diagnosis present

## 2018-02-28 DIAGNOSIS — Z881 Allergy status to other antibiotic agents status: Secondary | ICD-10-CM | POA: Diagnosis not present

## 2018-02-28 DIAGNOSIS — Z96649 Presence of unspecified artificial hip joint: Secondary | ICD-10-CM

## 2018-02-28 DIAGNOSIS — Z7902 Long term (current) use of antithrombotics/antiplatelets: Secondary | ICD-10-CM

## 2018-02-28 DIAGNOSIS — M25751 Osteophyte, right hip: Secondary | ICD-10-CM | POA: Diagnosis present

## 2018-02-28 DIAGNOSIS — K219 Gastro-esophageal reflux disease without esophagitis: Secondary | ICD-10-CM | POA: Diagnosis present

## 2018-02-28 DIAGNOSIS — H409 Unspecified glaucoma: Secondary | ICD-10-CM | POA: Diagnosis present

## 2018-02-28 DIAGNOSIS — Z886 Allergy status to analgesic agent status: Secondary | ICD-10-CM

## 2018-02-28 DIAGNOSIS — Z8673 Personal history of transient ischemic attack (TIA), and cerebral infarction without residual deficits: Secondary | ICD-10-CM

## 2018-02-28 DIAGNOSIS — M169 Osteoarthritis of hip, unspecified: Secondary | ICD-10-CM | POA: Diagnosis present

## 2018-02-28 DIAGNOSIS — I10 Essential (primary) hypertension: Secondary | ICD-10-CM | POA: Diagnosis present

## 2018-02-28 DIAGNOSIS — Z90710 Acquired absence of both cervix and uterus: Secondary | ICD-10-CM | POA: Diagnosis not present

## 2018-02-28 DIAGNOSIS — Z419 Encounter for procedure for purposes other than remedying health state, unspecified: Secondary | ICD-10-CM

## 2018-02-28 HISTORY — PX: TOTAL HIP ARTHROPLASTY: SHX124

## 2018-02-28 HISTORY — DX: Osteoarthritis of hip, unspecified: M16.9

## 2018-02-28 LAB — TYPE AND SCREEN
ABO/RH(D): A POS
Antibody Screen: NEGATIVE

## 2018-02-28 LAB — GLUCOSE, CAPILLARY
Glucose-Capillary: 119 mg/dL — ABNORMAL HIGH (ref 70–99)
Glucose-Capillary: 102 mg/dL — ABNORMAL HIGH (ref 70–99)

## 2018-02-28 SURGERY — ARTHROPLASTY, HIP, TOTAL, ANTERIOR APPROACH
Anesthesia: Monitor Anesthesia Care | Site: Hip | Laterality: Right

## 2018-02-28 MED ORDER — FLEET ENEMA 7-19 GM/118ML RE ENEM: 1.0000 | ENEMA | Freq: Once | RECTAL | Status: DC | PRN

## 2018-02-28 MED ORDER — BISACODYL 10 MG RE SUPP: 10.0000 mg | Freq: Every day | RECTAL | Status: DC | PRN

## 2018-02-28 MED ORDER — PHENYLEPHRINE 40 MCG/ML (10ML) SYRINGE FOR IV PUSH (FOR BLOOD PRESSURE SUPPORT)
PREFILLED_SYRINGE | INTRAVENOUS | Status: DC | PRN
  Administered 2018-02-28: 80 ug via INTRAVENOUS
  Administered 2018-02-28: 40 ug via INTRAVENOUS

## 2018-02-28 MED ORDER — POLYVINYL ALCOHOL 1.4 % OP SOLN
1.0000 [drp] | Freq: Every day | OPHTHALMIC | Status: DC
  Administered 2018-02-28 – 2018-03-01 (×2): 1 [drp] via OPHTHALMIC
  Filled 2018-02-28: qty 15

## 2018-02-28 MED ORDER — POLYETHYLENE GLYCOL 3350 17 G PO PACK: 17.0000 g | PACK | Freq: Every day | ORAL | Status: DC | PRN

## 2018-02-28 MED ORDER — DIPHENHYDRAMINE HCL 12.5 MG/5ML PO ELIX: 12.5000 mg | ORAL_SOLUTION | ORAL | Status: DC | PRN

## 2018-02-28 MED ORDER — HYDROCODONE-ACETAMINOPHEN 5-325 MG PO TABS
1.0000 | ORAL_TABLET | ORAL | Status: DC | PRN
  Administered 2018-02-28 – 2018-03-02 (×5): 2 via ORAL
  Filled 2018-02-28 (×2): qty 2
  Filled 2018-02-28: qty 1
  Filled 2018-02-28: qty 2
  Filled 2018-02-28: qty 1
  Filled 2018-02-28: qty 2

## 2018-02-28 MED ORDER — DEXAMETHASONE SODIUM PHOSPHATE 10 MG/ML IJ SOLN
8.0000 mg | Freq: Once | INTRAMUSCULAR | Status: AC
  Administered 2018-02-28: 8 mg via INTRAVENOUS

## 2018-02-28 MED ORDER — DEXAMETHASONE SODIUM PHOSPHATE 10 MG/ML IJ SOLN
10.0000 mg | Freq: Once | INTRAMUSCULAR | Status: AC
  Administered 2018-03-01: 10 mg via INTRAVENOUS
  Filled 2018-02-28: qty 1

## 2018-02-28 MED ORDER — BUPIVACAINE IN DEXTROSE 0.75-8.25 % IT SOLN
INTRATHECAL | Status: DC | PRN
  Administered 2018-02-28: 1.6 mL via INTRATHECAL

## 2018-02-28 MED ORDER — ONDANSETRON HCL 4 MG/2ML IJ SOLN: 4.0000 mg | Freq: Four times a day (QID) | INTRAMUSCULAR | Status: DC | PRN

## 2018-02-28 MED ORDER — PROPOFOL 500 MG/50ML IV EMUL
INTRAVENOUS | Status: DC | PRN
  Administered 2018-02-28: 25 ug/kg/min via INTRAVENOUS

## 2018-02-28 MED ORDER — ACETAMINOPHEN 500 MG PO TABS
500.0000 mg | ORAL_TABLET | Freq: Four times a day (QID) | ORAL | Status: AC
  Administered 2018-02-28 – 2018-03-01 (×3): 500 mg via ORAL
  Filled 2018-02-28 (×3): qty 1

## 2018-02-28 MED ORDER — LEVOTHYROXINE SODIUM 88 MCG PO TABS
88.0000 ug | ORAL_TABLET | Freq: Every day | ORAL | Status: DC
  Administered 2018-03-01 – 2018-03-02 (×2): 88 ug via ORAL
  Filled 2018-02-28 (×2): qty 1

## 2018-02-28 MED ORDER — METOCLOPRAMIDE HCL 5 MG/ML IJ SOLN: 5.0000 mg | Freq: Three times a day (TID) | INTRAMUSCULAR | Status: DC | PRN

## 2018-02-28 MED ORDER — TRANEXAMIC ACID-NACL 1000-0.7 MG/100ML-% IV SOLN
1000.0000 mg | INTRAVENOUS | Status: AC
  Administered 2018-02-28: 1000 mg via INTRAVENOUS
  Filled 2018-02-28: qty 100

## 2018-02-28 MED ORDER — ONDANSETRON HCL 4 MG/2ML IJ SOLN
INTRAMUSCULAR | Status: DC | PRN
  Administered 2018-02-28: 4 mg via INTRAVENOUS

## 2018-02-28 MED ORDER — LACTATED RINGERS IV SOLN
INTRAVENOUS | Status: DC
  Administered 2018-02-28 (×2): via INTRAVENOUS

## 2018-02-28 MED ORDER — PROPOFOL 10 MG/ML IV BOLUS
INTRAVENOUS | Status: AC
  Filled 2018-02-28: qty 20

## 2018-02-28 MED ORDER — IRBESARTAN 150 MG PO TABS
300.0000 mg | ORAL_TABLET | Freq: Every day | ORAL | Status: DC
  Administered 2018-03-01 – 2018-03-02 (×2): 300 mg via ORAL
  Filled 2018-02-28 (×2): qty 2

## 2018-02-28 MED ORDER — MENTHOL 3 MG MT LOZG: 1.0000 | LOZENGE | OROMUCOSAL | Status: DC | PRN

## 2018-02-28 MED ORDER — BUPIVACAINE-EPINEPHRINE (PF) 0.25% -1:200000 IJ SOLN
INTRAMUSCULAR | Status: DC | PRN
  Administered 2018-02-28: 30 mL via PERINEURAL

## 2018-02-28 MED ORDER — 0.9 % SODIUM CHLORIDE (POUR BTL) OPTIME
TOPICAL | Status: DC | PRN
  Administered 2018-02-28: 1000 mL

## 2018-02-28 MED ORDER — TRAMADOL HCL 50 MG PO TABS: 50.0000 mg | ORAL_TABLET | Freq: Four times a day (QID) | ORAL | Status: DC | PRN

## 2018-02-28 MED ORDER — ONDANSETRON HCL 4 MG/2ML IJ SOLN
INTRAMUSCULAR | Status: AC
  Filled 2018-02-28: qty 2

## 2018-02-28 MED ORDER — LATANOPROST 0.005 % OP SOLN
1.0000 [drp] | Freq: Every day | OPHTHALMIC | Status: DC
  Administered 2018-02-28 – 2018-03-01 (×2): 1 [drp] via OPHTHALMIC
  Filled 2018-02-28: qty 2.5

## 2018-02-28 MED ORDER — FERROUS SULFATE 325 (65 FE) MG PO TABS
325.0000 mg | ORAL_TABLET | Freq: Two times a day (BID) | ORAL | Status: DC
  Administered 2018-02-28 – 2018-03-02 (×4): 325 mg via ORAL
  Filled 2018-02-28 (×4): qty 1

## 2018-02-28 MED ORDER — CEFAZOLIN SODIUM-DEXTROSE 1-4 GM/50ML-% IV SOLN
1.0000 g | Freq: Four times a day (QID) | INTRAVENOUS | Status: AC
  Administered 2018-02-28 (×2): 1 g via INTRAVENOUS
  Filled 2018-02-28 (×2): qty 50

## 2018-02-28 MED ORDER — TIMOLOL MALEATE 0.25 % OP SOLN
1.0000 [drp] | Freq: Two times a day (BID) | OPHTHALMIC | Status: DC
  Administered 2018-02-28 – 2018-03-02 (×4): 1 [drp] via OPHTHALMIC
  Filled 2018-02-28: qty 5

## 2018-02-28 MED ORDER — FUROSEMIDE 40 MG PO TABS: 40.0000 mg | ORAL_TABLET | Freq: Every day | ORAL | Status: DC | PRN

## 2018-02-28 MED ORDER — PHENOL 1.4 % MT LIQD: 1.0000 | OROMUCOSAL | Status: DC | PRN

## 2018-02-28 MED ORDER — ASPIRIN EC 325 MG PO TBEC
325.0000 mg | DELAYED_RELEASE_TABLET | Freq: Every day | ORAL | Status: DC
  Administered 2018-03-01 – 2018-03-02 (×2): 325 mg via ORAL
  Filled 2018-02-28 (×2): qty 1

## 2018-02-28 MED ORDER — EPHEDRINE SULFATE-NACL 50-0.9 MG/10ML-% IV SOSY
PREFILLED_SYRINGE | INTRAVENOUS | Status: DC | PRN
  Administered 2018-02-28 (×2): 10 mg via INTRAVENOUS

## 2018-02-28 MED ORDER — METHOCARBAMOL 500 MG PO TABS
500.0000 mg | ORAL_TABLET | Freq: Four times a day (QID) | ORAL | Status: DC | PRN
  Administered 2018-02-28: 500 mg via ORAL
  Filled 2018-02-28: qty 1

## 2018-02-28 MED ORDER — DEXAMETHASONE SODIUM PHOSPHATE 10 MG/ML IJ SOLN
INTRAMUSCULAR | Status: AC
  Filled 2018-02-28: qty 1

## 2018-02-28 MED ORDER — PROPOFOL 10 MG/ML IV BOLUS
INTRAVENOUS | Status: DC | PRN
  Administered 2018-02-28: 10 mg via INTRAVENOUS

## 2018-02-28 MED ORDER — ACETAMINOPHEN 10 MG/ML IV SOLN
1000.0000 mg | Freq: Four times a day (QID) | INTRAVENOUS | Status: DC
  Administered 2018-02-28: 1000 mg via INTRAVENOUS
  Filled 2018-02-28: qty 100

## 2018-02-28 MED ORDER — METOCLOPRAMIDE HCL 5 MG PO TABS: 5.0000 mg | ORAL_TABLET | Freq: Three times a day (TID) | ORAL | Status: DC | PRN

## 2018-02-28 MED ORDER — STERILE WATER FOR IRRIGATION IR SOLN
Status: DC | PRN
  Administered 2018-02-28: 2000 mL

## 2018-02-28 MED ORDER — PROPOFOL 10 MG/ML IV BOLUS
INTRAVENOUS | Status: AC
  Filled 2018-02-28: qty 40

## 2018-02-28 MED ORDER — ONDANSETRON HCL 4 MG PO TABS: 4.0000 mg | ORAL_TABLET | Freq: Four times a day (QID) | ORAL | Status: DC | PRN

## 2018-02-28 MED ORDER — SIMVASTATIN 20 MG PO TABS
20.0000 mg | ORAL_TABLET | Freq: Every evening | ORAL | Status: DC
  Administered 2018-02-28 – 2018-03-01 (×2): 20 mg via ORAL
  Filled 2018-02-28 (×2): qty 1

## 2018-02-28 MED ORDER — IRBESARTAN-HYDROCHLOROTHIAZIDE 300-12.5 MG PO TABS: 1.0000 | ORAL_TABLET | Freq: Every day | ORAL | Status: DC

## 2018-02-28 MED ORDER — CLOPIDOGREL BISULFATE 75 MG PO TABS
75.0000 mg | ORAL_TABLET | Freq: Every day | ORAL | Status: DC
  Administered 2018-03-01 – 2018-03-02 (×2): 75 mg via ORAL
  Filled 2018-02-28 (×2): qty 1

## 2018-02-28 MED ORDER — DOCUSATE SODIUM 100 MG PO CAPS
100.0000 mg | ORAL_CAPSULE | Freq: Two times a day (BID) | ORAL | Status: DC
  Administered 2018-02-28 – 2018-03-02 (×5): 100 mg via ORAL
  Filled 2018-02-28 (×5): qty 1

## 2018-02-28 MED ORDER — PRAMIPEXOLE DIHYDROCHLORIDE 0.25 MG PO TABS
1.0000 mg | ORAL_TABLET | Freq: Every day | ORAL | Status: DC
  Administered 2018-02-28 – 2018-03-01 (×2): 1 mg via ORAL
  Filled 2018-02-28 (×2): qty 4

## 2018-02-28 MED ORDER — CHLORHEXIDINE GLUCONATE 4 % EX LIQD: 60.0000 mL | Freq: Once | CUTANEOUS | Status: DC

## 2018-02-28 MED ORDER — METHOCARBAMOL 500 MG IVPB - SIMPLE MED
500.0000 mg | Freq: Four times a day (QID) | INTRAVENOUS | Status: DC | PRN
  Filled 2018-02-28: qty 50

## 2018-02-28 MED ORDER — HYDROCHLOROTHIAZIDE 12.5 MG PO CAPS
12.5000 mg | ORAL_CAPSULE | Freq: Every day | ORAL | Status: DC
  Administered 2018-03-01 – 2018-03-02 (×2): 12.5 mg via ORAL
  Filled 2018-02-28 (×2): qty 1

## 2018-02-28 MED ORDER — BRIMONIDINE TARTRATE 0.2 % OP SOLN
1.0000 [drp] | Freq: Two times a day (BID) | OPHTHALMIC | Status: DC
  Administered 2018-02-28 – 2018-03-02 (×4): 1 [drp] via OPHTHALMIC
  Filled 2018-02-28: qty 5

## 2018-02-28 MED ORDER — PHENYLEPHRINE 40 MCG/ML (10ML) SYRINGE FOR IV PUSH (FOR BLOOD PRESSURE SUPPORT)
PREFILLED_SYRINGE | INTRAVENOUS | Status: AC
  Filled 2018-02-28: qty 10

## 2018-02-28 MED ORDER — CEFAZOLIN SODIUM-DEXTROSE 2-4 GM/100ML-% IV SOLN
2.0000 g | INTRAVENOUS | Status: AC
  Administered 2018-02-28: 2 g via INTRAVENOUS
  Filled 2018-02-28: qty 100

## 2018-02-28 MED ORDER — POLYVINYL ALCOHOL 1.4 % OP SOLN
1.0000 [drp] | Freq: Four times a day (QID) | OPHTHALMIC | Status: DC
  Administered 2018-02-28 – 2018-03-02 (×7): 1 [drp] via OPHTHALMIC
  Filled 2018-02-28: qty 15

## 2018-02-28 MED ORDER — METOPROLOL SUCCINATE ER 50 MG PO TB24
50.0000 mg | ORAL_TABLET | Freq: Every day | ORAL | Status: DC
  Administered 2018-02-28 – 2018-03-01 (×2): 50 mg via ORAL
  Filled 2018-02-28 (×2): qty 1

## 2018-02-28 MED ORDER — EPHEDRINE 5 MG/ML INJ
INTRAVENOUS | Status: AC
  Filled 2018-02-28: qty 10

## 2018-02-28 MED ORDER — SODIUM CHLORIDE 0.9 % IV SOLN
INTRAVENOUS | Status: DC
  Administered 2018-02-28 – 2018-03-01 (×2): via INTRAVENOUS

## 2018-02-28 MED ORDER — FENTANYL CITRATE (PF) 100 MCG/2ML IJ SOLN
INTRAMUSCULAR | Status: AC
  Filled 2018-02-28: qty 2

## 2018-02-28 MED ORDER — BUPIVACAINE-EPINEPHRINE (PF) 0.25% -1:200000 IJ SOLN
INTRAMUSCULAR | Status: AC
  Filled 2018-02-28: qty 30

## 2018-02-28 SURGICAL SUPPLY — 43 items
BAG DECANTER FOR FLEXI CONT (MISCELLANEOUS) ×1 IMPLANT
BAG ZIPLOCK 12X15 (MISCELLANEOUS) IMPLANT
BLADE SAG 18X100X1.27 (BLADE) ×3 IMPLANT
BLADE SURG SZ10 CARB STEEL (BLADE) ×6 IMPLANT
CLOSURE WOUND 1/2 X4 (GAUZE/BANDAGES/DRESSINGS) ×1
COVER PERINEAL POST (MISCELLANEOUS) ×3 IMPLANT
COVER SURGICAL LIGHT HANDLE (MISCELLANEOUS) ×3 IMPLANT
COVER WAND RF STERILE (DRAPES) ×2 IMPLANT
CUP ACETBLR 48 OD SECTOR II (Hips) ×2 IMPLANT
DECANTER SPIKE VIAL GLASS SM (MISCELLANEOUS) ×1 IMPLANT
DRAPE STERI IOBAN 125X83 (DRAPES) ×3 IMPLANT
DRAPE U-SHAPE 47X51 STRL (DRAPES) ×6 IMPLANT
DRSG ADAPTIC 3X8 NADH LF (GAUZE/BANDAGES/DRESSINGS) ×3 IMPLANT
DRSG MEPILEX BORDER 4X4 (GAUZE/BANDAGES/DRESSINGS) ×3 IMPLANT
DRSG MEPILEX BORDER 4X8 (GAUZE/BANDAGES/DRESSINGS) ×3 IMPLANT
DURAPREP 26ML APPLICATOR (WOUND CARE) ×3 IMPLANT
ELECT REM PT RETURN 15FT ADLT (MISCELLANEOUS) ×3 IMPLANT
EVACUATOR 1/8 PVC DRAIN (DRAIN) ×3 IMPLANT
GLOVE BIO SURGEON STRL SZ7 (GLOVE) ×3 IMPLANT
GLOVE BIO SURGEON STRL SZ8 (GLOVE) ×3 IMPLANT
GLOVE BIOGEL PI IND STRL 7.0 (GLOVE) ×1 IMPLANT
GLOVE BIOGEL PI IND STRL 8 (GLOVE) ×1 IMPLANT
GLOVE BIOGEL PI INDICATOR 7.0 (GLOVE) ×2
GLOVE BIOGEL PI INDICATOR 8 (GLOVE) ×2
GOWN STRL REUS W/TWL LRG LVL3 (GOWN DISPOSABLE) ×3 IMPLANT
GOWN STRL REUS W/TWL XL LVL3 (GOWN DISPOSABLE) ×3 IMPLANT
HEAD FEM STD 28X+1.5 STRL (Hips) ×2 IMPLANT
HOLDER FOLEY CATH W/STRAP (MISCELLANEOUS) ×3 IMPLANT
LINER MARATHON 28 48 (Hips) ×2 IMPLANT
MANIFOLD NEPTUNE II (INSTRUMENTS) ×3 IMPLANT
PACK ANTERIOR HIP CUSTOM (KITS) ×3 IMPLANT
STEM FEMORAL SZ 5MM STD ACTIS (Stem) ×2 IMPLANT
STRIP CLOSURE SKIN 1/2X4 (GAUZE/BANDAGES/DRESSINGS) ×2 IMPLANT
SUT ETHIBOND NAB CT1 #1 30IN (SUTURE) ×3 IMPLANT
SUT MNCRL AB 4-0 PS2 18 (SUTURE) ×3 IMPLANT
SUT STRATAFIX 0 PDS 27 VIOLET (SUTURE) ×3
SUT VIC AB 2-0 CT1 27 (SUTURE) ×6
SUT VIC AB 2-0 CT1 TAPERPNT 27 (SUTURE) ×2 IMPLANT
SUTURE STRATFX 0 PDS 27 VIOLET (SUTURE) ×1 IMPLANT
SYR 50ML LL SCALE MARK (SYRINGE) IMPLANT
TRAY FOLEY CATH 14FRSI W/METER (CATHETERS) ×2 IMPLANT
TRAY FOLEY MTR SLVR 16FR STAT (SET/KITS/TRAYS/PACK) ×1 IMPLANT
YANKAUER SUCT BULB TIP 10FT TU (MISCELLANEOUS) ×3 IMPLANT

## 2018-02-28 NOTE — Anesthesia Procedure Notes (Addendum)
Spinal  Patient location during procedure: OR Start time: 02/28/2018 12:18 PM End time: 02/28/2018 12:24 PM Reason for block: at surgeon's request Staffing Resident/CRNA: West Pugh, CRNA Performed: resident/CRNA  Preanesthetic Checklist Completed: patient identified, site marked, surgical consent, pre-op evaluation, timeout performed, IV checked, risks and benefits discussed and monitors and equipment checked Spinal Block Patient position: sitting Prep: DuraPrep Patient monitoring: heart rate, continuous pulse ox and blood pressure Approach: midline Location: L3-4 Injection technique: single-shot Needle Needle type: Pencan  Needle gauge: 24 G Needle length: 9 cm Assessment Sensory level: T8 Additional Notes Expiration of kit checked and confirmed. Patient reports last dose of Plavix on 02/20/2018 Patient tolerated procedure well,without complications x 1 attempt with noted clear CSF. Loss of motor and sensory on exam post injection. Dr Ermalene Postin present throughout procedure.

## 2018-02-28 NOTE — Interval H&P Note (Signed)
History and Physical Interval Note:  02/28/2018 12:04 PM  Dana Bradford  has presented today for surgery, with the diagnosis of right hip osteoarthritis  The various methods of treatment have been discussed with the patient and family. After consideration of risks, benefits and other options for treatment, the patient has consented to  Procedure(s) with comments: [place] (Right) - 182min as a surgical intervention .  The patient's history has been reviewed, patient examined, no change in status, stable for surgery.  I have reviewed the patient's chart and labs.  Questions were answered to the patient's satisfaction.     Pilar Plate Cylah Fannin

## 2018-02-28 NOTE — Transfer of Care (Signed)
Immediate Anesthesia Transfer of Care Note  Patient: Dana Bradford  Procedure(s) Performed: RIGHT TOTAL HIP ARTHROPLASTY ANTERIOR APPROACH (Right Hip)  Patient Location: PACU  Anesthesia Type:MAC and Spinal  Level of Consciousness: awake, alert , oriented and patient cooperative  Airway & Oxygen Therapy: Patient Spontanous Breathing and Patient connected to face mask oxygen  Post-op Assessment: Report given to RN and Post -op Vital signs reviewed and stable  Post vital signs: Reviewed and stable  Last Vitals:  Vitals Value Taken Time  BP 122/86 02/28/2018  2:09 PM  Temp    Pulse 57 02/28/2018  2:12 PM  Resp 19 02/28/2018  2:12 PM  SpO2 100 % 02/28/2018  2:12 PM  Vitals shown include unvalidated device data.  Last Pain:  Vitals:   02/28/18 1134  TempSrc:   PainSc: 10-Worst pain ever         Complications: No apparent anesthesia complications

## 2018-02-28 NOTE — Op Note (Signed)
OPERATIVE REPORT- TOTAL HIP ARTHROPLASTY   PREOPERATIVE DIAGNOSIS: Osteoarthritis of the Right hip.   POSTOPERATIVE DIAGNOSIS: Osteoarthritis of the Right  hip.   PROCEDURE: Right total hip arthroplasty, anterior approach.   SURGEON: Gaynelle Arabian, MD   ASSISTANT: Theresa Duty, PA-C  ANESTHESIA:  Spinal  ESTIMATED BLOOD LOSS:-200 mL    DRAINS: Hemovac x1.   COMPLICATIONS: None   CONDITION: PACU - hemodynamically stable.   BRIEF CLINICAL NOTE: Dana Bradford is a 85 y.o. female who has advanced end-  stage arthritis of their Right  hip with progressively worsening pain and  dysfunction.The patient has failed nonoperative management and presents for  total hip arthroplasty.   PROCEDURE IN DETAIL: After successful administration of spinal  anesthetic, the traction boots for the [facility] bed were placed on both  feet and the patient was placed onto the [facility] bed, boots placed into the leg  holders. The Right hip was then isolated from the perineum with plastic  drapes and prepped and draped in the usual sterile fashion. ASIS and  greater trochanter were marked and a oblique incision was made, starting  at about 1 cm lateral and 2 cm distal to the ASIS and coursing towards  the anterior cortex of the femur. The skin was cut with a 10 blade  through subcutaneous tissue to the level of the fascia overlying the  tensor fascia lata muscle. The fascia was then incised in line with the  incision at the junction of the anterior third and posterior 2/3rd. The  muscle was teased off the fascia and then the interval between the TFL  and the rectus was developed. The Hohmann retractor was then placed at  the top of the femoral neck over the capsule. The vessels overlying the  capsule were cauterized and the fat on top of the capsule was removed.  A Hohmann retractor was then placed anterior underneath the rectus  femoris to give exposure to the entire anterior capsule. A T-shaped   capsulotomy was performed. The edges were tagged and the femoral head  was identified.       Osteophytes are removed off the superior acetabulum.  The femoral neck was then cut in situ with an oscillating saw. Traction  was then applied to the left lower extremity utilizing the [facility]  traction. The femoral head was then removed. Retractors were placed  around the acetabulum and then circumferential removal of the labrum was  performed. Osteophytes were also removed. Reaming starts at 45 mm to  medialize and  Increased in 2 mm increments to 47 mm. We reamed in  approximately 40 degrees of abduction, 20 degrees anteversion. She had a protrusio deformity and I placed cancellous bone  in the acetabulum to restore the native center of rotation. A 48 mm  pinnacle acetabular shell was then impacted in anatomic position under  fluoroscopic guidance with excellent purchase. We did not need to place  any additional dome screws. A 28 mm neutral + 4 marathon liner was then  placed into the acetabular shell.       The femoral lift was then placed along the lateral aspect of the femur  just distal to the vastus ridge. The leg was  externally rotated and capsule  was stripped off the inferior aspect of the femoral neck down to the  level of the lesser trochanter, this was done with electrocautery. The femur was lifted after this was performed. The  leg was then placed in an  extended and adducted position essentially delivering the femur. We also removed the capsule superiorly and the piriformis from the piriformis fossa to gain excellent exposure of the  proximal femur. Rongeur was used to remove some cancellous bone to get  into the lateral portion of the proximal femur for placement of the  initial starter reamer. The starter broaches was placed  the starter broach  and was shown to go down the center of the canal. Broaching  with the Actis system was then performed starting at size 0  coursing  Up to  size 5. A size 5 had excellent torsional and rotational  and axial stability. The trial standard offset neck was then placed  with a 28 + 1.5 trial head. The hip was then reduced. We confirmed that  the stem was in the canal both on AP and lateral x-rays. It also has excellent sizing. The hip was reduced with outstanding stability through full extension and full external rotation.. AP pelvis was taken and the leg lengths were measured and found to be equal. Hip was then dislocated again and the femoral head and neck removed. The  femoral broach was removed. Size 5 Actis stem with a standard offset  neck was then impacted into the femur following native anteversion. Has  excellent purchase in the canal. Excellent torsional and rotational and  axial stability. It is confirmed to be in the canal on AP and lateral  fluoroscopic views. The 28 + 1.5 metal head was placed and the hip  reduced with outstanding stability. Again AP pelvis was taken and it  confirmed that the leg lengths were equal. The wound was then copiously  irrigated with saline solution and the capsule reattached and repaired  with Ethibond suture. 30 ml of .25% Bupivicaine was  injected into the capsule and into the edge of the tensor fascia lata as well as subcutaneous tissue. The fascia overlying the tensor fascia lata was then closed with a running #1 V-Loc. Subcu was closed with interrupted 2-0 Vicryl and subcuticular running 4-0 Monocryl. Incision was cleaned  and dried. Steri-Strips and a bulky sterile dressing applied. Hemovac  drain was hooked to suction and then the patient was awakened and transported to  recovery in stable condition.        Please note that a surgical assistant was a medical necessity for this procedure to perform it in a safe and expeditious manner. Assistant was necessary to provide appropriate retraction of vital neurovascular structures and to prevent femoral fracture and allow for anatomic placement of  the prosthesis.  Gaynelle Arabian, M.D.

## 2018-02-28 NOTE — Discharge Instructions (Signed)
°Dr. Frank Aluisio °Total Joint Specialist °[facility] °[address] °[place], [place] [address] °[phone] ° °ANTERIOR APPROACH TOTAL HIP REPLACEMENT POSTOPERATIVE DIRECTIONS ° ° °Hip Rehabilitation, Guidelines Following Surgery  °The results of a hip operation are greatly improved after range of motion and muscle strengthening exercises. Follow all safety measures which are given to protect your hip. If any of these exercises cause increased pain or swelling in your joint, decrease the amount until you are comfortable again. Then slowly increase the exercises. Call your caregiver if you have problems or questions.  ° °HOME CARE INSTRUCTIONS  °• Remove items at home which could result in a fall. This includes throw rugs or furniture in walking pathways.  °· ICE to the affected hip every three hours for 30 minutes at a time and then as needed for pain and swelling.  Continue to use ice on the hip for pain and swelling from surgery. You may notice swelling that will progress down to the foot and ankle.  This is normal after surgery.  Elevate the leg when you are not up walking on it.   °· Continue to use the breathing machine which will help keep your temperature down.  It is common for your temperature to cycle up and down following surgery, especially at night when you are not up moving around and exerting yourself.  The breathing machine keeps your lungs expanded and your temperature down. ° °DIET °You may resume your previous home diet once your are discharged from the hospital. ° °DRESSING / WOUND CARE / SHOWERING °You may shower 3 days after surgery, but keep the wounds dry during showering.  You may use an occlusive plastic wrap (Press'n Seal for example), NO SOAKING/SUBMERGING IN THE BATHTUB.  If the bandage gets wet, change with a clean dry gauze.  If the incision gets wet, pat the wound dry with a clean towel. °You may start showering once you are discharged home but do not submerge the  incision under water. Just pat the incision dry and apply a dry gauze dressing on daily. °Change the surgical dressing daily and reapply a dry dressing each time. ° °ACTIVITY °Walk with your walker as instructed. °Use walker as long as suggested by your caregivers. °Avoid periods of inactivity such as sitting longer than an hour when not asleep. This helps prevent blood clots.  °You may resume a sexual relationship in one month or when given the OK by your doctor.  °You may return to work once you are cleared by your doctor.  °Do not drive a car for 6 weeks or until released by you surgeon.  °Do not drive while taking narcotics. ° °WEIGHT BEARING °Weight bearing as tolerated with assist device (walker, cane, etc) as directed, use it as long as suggested by your surgeon or therapist, typically at least 4-6 weeks. ° °POSTOPERATIVE CONSTIPATION PROTOCOL °Constipation - defined medically as fewer than three stools per week and severe constipation as less than one stool per week. ° °One of the most common issues patients have following surgery is constipation.  Even if you have a regular bowel pattern at home, your normal regimen is likely to be disrupted due to multiple reasons following surgery.  Combination of anesthesia, postoperative narcotics, change in appetite and fluid intake all can affect your bowels.  In order to avoid complications following surgery, here are some recommendations in order to help you during your recovery period. ° °Colace (docusate) - Pick up an over-the-counter form   of Colace or another stool softener and take twice a day as long as you are requiring postoperative pain medications.  Take with a full glass of water daily.  If you experience loose stools or diarrhea, hold the colace until you stool forms back up.  If your symptoms do not get better within 1 week or if they get worse, check with your doctor. ° °Dulcolax (bisacodyl) - Pick up over-the-counter and take as directed by the product  packaging as needed to assist with the movement of your bowels.  Take with a full glass of water.  Use this product as needed if not relieved by Colace only.  ° °MiraLax (polyethylene glycol) - Pick up over-the-counter to have on hand.  MiraLax is a solution that will increase the amount of water in your bowels to assist with bowel movements.  Take as directed and can mix with a glass of water, juice, soda, coffee, or tea.  Take if you go more than two days without a movement. °Do not use MiraLax more than once per day. Call your doctor if you are still constipated or irregular after using this medication for 7 days in a row. ° °If you continue to have problems with postoperative constipation, please contact the office for further assistance and recommendations.  If you experience "the worst abdominal pain ever" or develop nausea or vomiting, please contact the office immediatly for further recommendations for treatment. ° °ITCHING ° If you experience itching with your medications, try taking only a single pain pill, or even half a pain pill at a time.  You can also use Benadryl over the counter for itching or also to help with sleep.  ° °TED HOSE STOCKINGS °Wear the elastic stockings on both legs for three weeks following surgery during the day but you may remove then at night for sleeping. ° °MEDICATIONS °See your medication summary on the “After Visit Summary” that the nursing staff will review with you prior to discharge.  You may have some home medications which will be placed on hold until you complete the course of blood thinner medication.  It is important for you to complete the blood thinner medication as prescribed by your surgeon.  Continue your approved medications as instructed at time of discharge. ° °PRECAUTIONS °If you experience chest pain or shortness of breath - call 911 immediately for transfer to the hospital emergency department.  °If you develop a fever greater that 101 F, purulent drainage  from wound, increased redness or drainage from wound, foul odor from the wound/dressing, or calf pain - CONTACT YOUR SURGEON.   °                                                °FOLLOW-UP APPOINTMENTS °Make sure you keep all of your appointments after your operation with your surgeon and caregivers. You should call the office at the above phone number and make an appointment for approximately two weeks after the date of your surgery or on the date instructed by your surgeon outlined in the "After Visit Summary". ° °RANGE OF MOTION AND STRENGTHENING EXERCISES  °These exercises are designed to help you keep full movement of your hip joint. Follow your caregiver's or physical therapist's instructions. Perform all exercises about fifteen times, three times per day or as directed. Exercise both hips, even if you have   had only one joint replacement. These exercises can be done on a training (exercise) mat, on the floor, on a table or on a bed. Use whatever works the best and is most comfortable for you. Use music or television while you are exercising so that the exercises are a pleasant break in your day. This will make your life better with the exercises acting as a break in routine you can look forward to.  °• Lying on your back, slowly slide your foot toward your buttocks, raising your knee up off the floor. Then slowly slide your foot back down until your leg is straight again.  °• Lying on your back spread your legs as far apart as you can without causing discomfort.  °• Lying on your side, raise your upper leg and foot straight up from the floor as far as is comfortable. Slowly lower the leg and repeat.  °• Lying on your back, tighten up the muscle in the front of your thigh (quadriceps muscles). You can do this by keeping your leg straight and trying to raise your heel off the floor. This helps strengthen the largest muscle supporting your knee.  °• Lying on your back, tighten up the muscles of your buttocks both  with the legs straight and with the knee bent at a comfortable angle while keeping your heel on the floor.  ° °IF YOU ARE TRANSFERRED TO A SKILLED REHAB FACILITY °If the patient is transferred to a skilled rehab facility following release from the hospital, a list of the current medications will be sent to the facility for the patient to continue.  When discharged from the skilled rehab facility, please have the facility set up the patient's Home Health Physical Therapy prior to being released. Also, the skilled facility will be responsible for providing the patient with their medications at time of release from the facility to include their pain medication, the muscle relaxants, and their blood thinner medication. If the patient is still at the rehab facility at time of the two week follow up appointment, the skilled rehab facility will also need to assist the patient in arranging follow up appointment in our office and any transportation needs. ° °MAKE SURE YOU:  °• Understand these instructions.  °• Get help right away if you are not doing well or get worse.  ° ° °Pick up stool softner and laxative for home use following surgery while on pain medications. °Do not submerge incision under water. °Please use good hand washing techniques while changing dressing each day. °May shower starting three days after surgery. °Please use a clean towel to pat the incision dry following showers. °Continue to use ice for pain and swelling after surgery. °Do not use any lotions or creams on the incision until instructed by your surgeon. ° °

## 2018-02-28 NOTE — Anesthesia Procedure Notes (Signed)
Procedure Name: MAC Date/Time: 02/28/2018 12:16 PM Performed by: West Pugh, CRNA Pre-anesthesia Checklist: Patient identified, Emergency Drugs available, Suction available, Patient being monitored and Timeout performed Patient Re-evaluated:Patient Re-evaluated prior to induction Oxygen Delivery Method: Simple face mask Placement Confirmation: positive ETCO2 Dental Injury: Teeth and Oropharynx as per pre-operative assessment

## 2018-02-28 NOTE — Evaluation (Signed)
Physical Therapy Evaluation Patient Details Name: Dana Bradford MRN: [id number] DOB: 14-Aug-1930 Today's Date: 02/28/2018   History of Present Illness  85 yo female s/p R DA-THA on 02/28/18. PMH includes GERD, HTN, RLS, mastectomy, spleenectomy.   Clinical Impression  Pt presents with R hip pain, LE weakness, difficulty performing mobility tasks, fear of falling, and decreased tolerance for ambulation due to pain and fatigue. Pt to benefit from acute PT to address deficits. Pt ambulated 15 ft in room with RW with min guard assist for safety. Pt educated on ankle pumps (20/hour) to perform this afternoon/evening to increase circulation, to pt's tolerance and limited by pain. PT to progress mobility as tolerated, and will continue to follow acutely.        Follow Up Recommendations Follow surgeon's recommendation for DC plan and follow-up therapies;Supervision for mobility/OOB(HHPT vs HEP)    Equipment Recommendations  None recommended by PT    Recommendations for Other Services       Precautions / Restrictions Precautions Precautions: Fall Restrictions Weight Bearing Restrictions: No Other Position/Activity Restrictions: WBAT       Mobility  Bed Mobility Overal bed mobility: Needs Assistance Bed Mobility: Supine to Sit     Supine to sit: Min assist;HOB elevated     General bed mobility comments: Min assist for RLE lift assist. Increased time and effort, VC for sequencing.   Transfers Overall transfer level: Needs assistance Equipment used: Rolling walker (2 wheeled) Transfers: Sit to/from Stand Sit to Stand: Min guard;From elevated surface         General transfer comment: Min guard for safety. Verbal cuing for hand placement.   Ambulation/Gait Ambulation/Gait assistance: Min guard Gait Distance (Feet): 15 Feet Assistive device: Rolling walker (2 wheeled) Gait Pattern/deviations: Step-to pattern;Decreased stance time - right;Decreased weight shift to  right;Antalgic Gait velocity: decr    General Gait Details: Min guard for safety. Verbal cuing for placement in RW, sequencing. Pt very fearful of falling during ambulation.   Stairs            Wheelchair Mobility    Modified Rankin (Stroke Patients Only)       Balance Overall balance assessment: Needs assistance Sitting-balance support: No upper extremity supported Sitting balance-Leahy Scale: Good     Standing balance support: Bilateral upper extremity supported Standing balance-Leahy Scale: Poor Standing balance comment: relies heavily on RW in static and dynamic standing                             Pertinent Vitals/Pain Pain Assessment: 0-10 Pain Score: 2  Pain Location: R hip  Pain Descriptors / Indicators: Sore;Aching Pain Intervention(s): Limited activity within patient's tolerance;Repositioned;Ice applied;Monitored during session;Premedicated before session    Home Living Family/patient expects to be discharged to:: Private residence Living Arrangements: Alone Available Help at Discharge: Family(Pt's daughter lives across the street, will be staying overnight with pt for as long as she needs) Type of Home: House Home Access: Stairs to enter Entrance Stairs-Rails: Right Entrance Stairs-Number of Steps: 3  Home Layout: One [place]: Clinical cytogeneticist - 2 wheels;Cane - single point;Bedside commode;Other (comment)(toilet frame )      Prior Function Level of Independence: Independent with assistive device(s)         Comments: Pt reports using RW for home mobility prior to admission. Pt states she did not do much walking PTA due to pain. Pt with history of "too many falls to count"  Hand Dominance   Dominant Hand: Right    Extremity/Trunk Assessment   Upper Extremity Assessment Upper Extremity Assessment: Overall WFL for tasks assessed    Lower Extremity Assessment Lower Extremity Assessment: Generalized weakness;RLE  deficits/detail RLE Deficits / Details: suspected post-surgical hip weakness; able to perform ankle pumps, heel slides, quad sets, SLR with lift assist during bed mobility  RLE Sensation: WNL    Cervical / Trunk Assessment Cervical / Trunk Assessment: Kyphotic  Communication   Communication: No difficulties  Cognition Arousal/Alertness: Awake/alert Behavior During Therapy: WFL for tasks assessed/performed Overall Cognitive Status: Within Functional Limits for tasks assessed                                        General Comments      Exercises     Assessment/Plan    PT Assessment Patient needs continued PT services  PT Problem List Decreased strength;Pain;Decreased activity tolerance;Decreased knowledge of use of DME;Decreased balance;Decreased mobility       PT Treatment Interventions DME instruction;Therapeutic activities;Gait training;Therapeutic exercise;Patient/family education;Balance training;Functional mobility training;Stair training    PT Goals (Current goals can be found in the Care Plan section)  Acute Rehab PT Goals Patient Stated Goal: none stated  PT Goal Formulation: With patient Time For Goal Achievement: 03/07/18 Potential to Achieve Goals: Good    Frequency 7X/week   Barriers to discharge        Co-evaluation               AM-PAC PT "6 Clicks" Mobility  Outcome Measure Help needed turning from your back to your side while in a flat bed without using bedrails?: A Little Help needed moving from lying on your back to sitting on the side of a flat bed without using bedrails?: A Little Help needed moving to and from a bed to a chair (including a wheelchair)?: A Little Help needed standing up from a chair using your arms (e.g., wheelchair or bedside chair)?: A Little Help needed to walk in hospital room?: A Little Help needed climbing 3-5 steps with a railing? : A Little 6 Click Score: 18    End of Session Equipment Utilized  During Treatment: Gait belt Activity Tolerance: Patient tolerated treatment well Patient left: in chair;with chair alarm set;with call bell/phone within reach;with SCD's reapplied Nurse Communication: Mobility status PT Visit Diagnosis: Other abnormalities of gait and mobility (R26.89);Difficulty in walking, not elsewhere classified (R26.2)    Time: 9798-9211 PT Time Calculation (min) (ACUTE ONLY): 26 min   Charges:   PT Evaluation $PT Eval Low Complexity: 1 Low PT Treatments $Gait Training: 8-22 mins      Julien Girt, PT Acute Rehabilitation Services Pager [phone]  Office [phone]  Isabella Roemmich D Elonda Husky 02/28/2018, 7:29 PM

## 2018-03-01 ENCOUNTER — Encounter (HOSPITAL_COMMUNITY): Payer: Self-pay | Admitting: Orthopedic Surgery

## 2018-03-01 LAB — CBC
HCT: 34.9 % — ABNORMAL LOW (ref 36.0–46.0)
Hemoglobin: 11.5 g/dL — ABNORMAL LOW (ref 12.0–15.0)
MCH: 32.3 pg (ref 26.0–34.0)
MCHC: 33 g/dL (ref 30.0–36.0)
MCV: 98 fL (ref 80.0–100.0)
Platelets: 252 10*3/uL (ref 150–400)
RBC: 3.56 MIL/uL — ABNORMAL LOW (ref 3.87–5.11)
RDW: 12.2 % (ref 11.5–15.5)
WBC: 14.2 10*3/uL — ABNORMAL HIGH (ref 4.0–10.5)
nRBC: 0 % (ref 0.0–0.2)

## 2018-03-01 LAB — BASIC METABOLIC PANEL
Anion gap: 8 (ref 5–15)
BUN: 25 mg/dL — AB (ref 8–23)
CO2: 27 mmol/L (ref 22–32)
Calcium: 8.5 mg/dL — ABNORMAL LOW (ref 8.9–10.3)
Chloride: 102 mmol/L (ref 98–111)
Creatinine, Ser: 0.78 mg/dL (ref 0.44–1.00)
GFR calc Af Amer: >60 mL/min (ref >60)
GFR calc non Af Amer: >60 mL/min (ref >60)
Glucose, Bld: 141 mg/dL — ABNORMAL HIGH (ref 70–99)
Potassium: 3.7 mmol/L (ref 3.5–5.1)
Sodium: 137 mmol/L (ref 135–145)

## 2018-03-01 NOTE — Progress Notes (Signed)
   Subjective: 1 Day Post-Op Procedure(s) (LRB): RIGHT TOTAL HIP ARTHROPLASTY ANTERIOR APPROACH (Right) Patient reports pain as moderate.   Patient seen in rounds by Dr. Wynelle Link. Patient is well, and has had no acute complaints or problems other than pain in the right hip. No issues overnight. Foley catheter to be removed this AM. Denies chest pain or SOB. We will continue therapy today.   Objective: Vital signs in last 24 hours: Temp:  [97.4 F (36.3 C)-98.5 F (36.9 C)] 98.3 F (36.8 C) (01/09 0533) Pulse Rate:  [53-77] 67 (01/09 0533) Resp:  [14-21] 16 (01/09 0533) BP: (116-158)/(66-93) 127/66 (01/09 0533) SpO2:  [97 %-100 %] 97 % (01/09 0533) Weight:  [72.6 kg] 72.6 kg (01/08 1134)  Intake/Output from previous day:  Intake/Output Summary (Last 24 hours) at 03/01/2018 0714 Last data filed at 03/01/2018 0534 Gross per 24 hour  Intake 4132.26 ml  Output 2933 ml  Net 1199.26 ml    Labs: Recent Labs    02/26/18 0922 03/01/18 0522  HGB 12.6 11.5*   Recent Labs    02/26/18 0922 03/01/18 0522  WBC 8.1 14.2*  RBC 3.92 3.56*  HCT 39.2 34.9*  PLT 248 252   Recent Labs    02/26/18 0922 03/01/18 0522  NA 140 137  K 3.9 3.7  CL 102 102  CO2 28 27  BUN 35* 25*  CREATININE 1.04* 0.78  GLUCOSE 112* 141*  CALCIUM 9.2 8.5*   Recent Labs    02/26/18 0922  INR 0.85    Exam: General - Patient is Alert and Oriented Extremity - Neurologically intact Neurovascular intact Sensation intact distally Dorsiflexion/Plantar flexion intact Dressing - dressing C/D/I Motor Function - intact, moving foot and toes well on exam.   Past Medical History:  Diagnosis Date  . Acute kidney failure ([place]) 2016 to 2017 due to vacomycin   no kidney issues now  . Arthritis   . Food allergy    Fish (H[place])   . Glaucoma   . Hypertension   . Impaired fasting glucose   . Other specified acquired hypothyroidism   . Restless leg   . Restless leg   . 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   Weight as of this encounter: 72.6 kg. Advance diet Up with therapy  DVT Prophylaxis - Aspirin and Plavix Weight bearing as tolerated. D/C O2 and pulse ox and try on room air. Hemovac pulled without difficulty, will continue therapy.  Plan is to go Home after hospital stay.  Plan for discharge tomorrow.  Theresa Duty, PA-C Orthopedic Surgery 03/01/2018, 7:14 AM

## 2018-03-01 NOTE — Plan of Care (Signed)
Pt is stable. Plan of care reviewed.

## 2018-03-01 NOTE — Progress Notes (Signed)
Physical Therapy Treatment Patient Details Name: Dana Bradford MRN: [id number] DOB: [birth year] Today's Date: 03/01/2018    History of Present Illness 85 yo female s/p R DA-THA on 02/28/18. PMH includes GERD, HTN, RLS, mastectomy, spleenectomy.     PT Comments    The patient moves slowly, patient reports minimal pain. Patient will need a youth RW. Continue PT.   Follow Up Recommendations  Follow surgeon's recommendation for DC plan and follow-up therapies;Supervision for mobility/OOB     Equipment Recommendations  (youth RW)    Recommendations for Other Services       Precautions / Restrictions Precautions Precautions: Fall Restrictions Other Position/Activity Restrictions: WBAT     Mobility  Bed Mobility               General bed mobility comments: OOB  Transfers Overall transfer level: Needs assistance Equipment used: Rolling walker (2 wheeled) Transfers: Sit to/from Stand Sit to Stand: Min guard;From elevated surface;Min assist         General transfer comment: Min / min guard from recliner and Toilet with rail.  Verbal cuing for hand placement.   Ambulation/Gait Ambulation/Gait assistance: Min assist Gait Distance (Feet): 15 Feet Assistive device: Rolling walker (2 wheeled) Gait Pattern/deviations: Step-to pattern;Decreased stance time - right;Decreased weight shift to right;Antalgic Gait velocity: decr    General Gait Details: min assist at times for stability, Patient takes much extra time to move forward.    Stairs             Wheelchair Mobility    Modified Rankin (Stroke Patients Only)       Balance                                            Cognition Arousal/Alertness: Awake/alert                                            Exercises Total Joint Exercises Ankle Circles/Pumps: AROM;Both;10 reps;Supine Heel Slides: AAROM;Right;10 reps;Supine Hip ABduction/ADduction: AAROM;Right;10  reps;Supine    General Comments        Pertinent Vitals/Pain Pain Score: 2  Pain Location: R hip  Pain Descriptors / Indicators: Sore;Aching Pain Intervention(s): Monitored during session;Premedicated before session    Home Living                      Prior Function            PT Goals (current goals can now be found in the care plan section) Progress towards PT goals: Progressing toward goals    Frequency    7X/week      PT Plan Current plan remains appropriate    Co-evaluation              AM-PAC PT "6 Clicks" Mobility   Outcome Measure  Help needed turning from your back to your side while in a flat bed without using bedrails?: A Little Help needed moving from lying on your back to sitting on the side of a flat bed without using bedrails?: A Little Help needed moving to and from a bed to a chair (including a wheelchair)?: A Little Help needed standing up from a chair using your arms (e.g., wheelchair or bedside chair)?: A Little Help needed to  walk in hospital room?: A Little Help needed climbing 3-5 steps with a railing? : A Lot 6 Click Score: 17    End of Session   Activity Tolerance: Patient tolerated treatment well Patient left: in chair;with call bell/phone within reach;with chair alarm set Nurse Communication: Mobility status PT Visit Diagnosis: Other abnormalities of gait and mobility (R26.89);Difficulty in walking, not elsewhere classified (R26.2)     Time: 7543-6067 PT Time Calculation (min) (ACUTE ONLY): 34 min  Charges:  $Gait Training: 8-22 mins $Therapeutic Exercise: 8-22 mins                     Tresa Endo PT Acute Rehabilitation Services Pager [phone] Office [phone]    Claretha Cooper 03/01/2018, 1:52 PM

## 2018-03-01 NOTE — Progress Notes (Signed)
Physical Therapy Treatment Patient Details Name: Dana Bradford MRN: [id number] DOB: [birth year] Today's Date: 03/01/2018    History of Present Illness 85 yo female s/p R DA-THA on 02/28/18. PMH includes GERD, HTN, RLS, mastectomy, spleenectomy.     PT Comments    POD # 1 pm session Assisted with amb to and from bathroom.  General Gait Details: VERY slow gait with short shuffled steps and a great fear of falling.  Pt uncomfortable with our "slick" looking floors and the walker wheels she feels may "get away" from her.  Assisted back to bed.  General bed mobility comments: demonstarted and instructed pt how to use a belt to self assist R LE up onto bed.  Pt unable to fully self complete and required assist despite effort. Performed some TE's following HEP handout.  Instructed on proper tech, freq as well as use of ICE.   Pt plans to D/C to home tomorrow with help from daughter who "lives across the street from me".  No family in room this afternoon, so will need to practices stairs tomorrow prior to D/C.   Follow Up Recommendations  Follow surgeon's recommendation for DC plan and follow-up therapies;Supervision for mobility/OOB     Equipment Recommendations  Rolling walker with 5" wheels(youth)    Recommendations for Other Services       Precautions / Restrictions Precautions Precautions: Fall Restrictions Weight Bearing Restrictions: No Other Position/Activity Restrictions: WBAT     Mobility  Bed Mobility Overal bed mobility: Needs Assistance Bed Mobility: Sit to Supine       Sit to supine: Mod assist   General bed mobility comments: demonstarted and instructed pt how to use a belt to self assist R LE up onto bed.  Pt unable to fully self complete and required assist despite effort.  Transfers Overall transfer level: Needs assistance Equipment used: Rolling walker (2 wheeled) Transfers: Sit to/from Stand Sit to Stand: Min assist         General transfer comment: very  slow to rise with posterior lean.  Good use of hands to steady self but delayed balance coorection.  Also assisted to raised commode same fashion.  HIGH FALL RISK.   Ambulation/Gait Ambulation/Gait assistance: Min guard;Min assist Gait Distance (Feet): 24 Feet(12 feet x 2 to and from bathroom.  ) Assistive device: Rolling walker (2 wheeled) Gait Pattern/deviations: Step-to pattern;Decreased stance time - right;Decreased weight shift to right;Antalgic Gait velocity: decreased x 4   General Gait Details: VERY slow gait with short shuffled steps and a great fear of falling.  Pt uncomfortable with our "slick" looking floors and the walker wheels she feels may "get away" from her.     Stairs             Wheelchair Mobility    Modified Rankin (Stroke Patients Only)       Balance                                            Cognition Arousal/Alertness: Awake/alert Behavior During Therapy: WFL for tasks assessed/performed Overall Cognitive Status: Within Functional Limits for tasks assessed                                        Exercises Total Hip Replacement TE's 10 reps ankle pumps  10 reps knee presses 10 reps heel slides 10 reps SAQ's 10 reps ABD Followed by ICE   General Comments        Pertinent Vitals/Pain Pain Assessment: 0-10 Pain Score: 8  Pain Location: R hip  Pain Descriptors / Indicators: Sore;Aching Pain Intervention(s): Monitored during session;Repositioned;Ice applied    Home Living                      Prior Function            PT Goals (current goals can now be found in the care plan section) Progress towards PT goals: Progressing toward goals    Frequency    7X/week      PT Plan Current plan remains appropriate    Co-evaluation              AM-PAC PT "6 Clicks" Mobility   Outcome Measure  Help needed turning from your back to your side while in a flat bed without using bedrails?:  A Little Help needed moving from lying on your back to sitting on the side of a flat bed without using bedrails?: A Little Help needed moving to and from a bed to a chair (including a wheelchair)?: A Little Help needed standing up from a chair using your arms (e.g., wheelchair or bedside chair)?: A Little Help needed to walk in hospital room?: A Little Help needed climbing 3-5 steps with a railing? : A Lot 6 Click Score: 17    End of Session Equipment Utilized During Treatment: Gait belt Activity Tolerance: Patient tolerated treatment well Patient left: in bed;with bed alarm set;with call bell/phone within reach Nurse Communication: Mobility status PT Visit Diagnosis: Other abnormalities of gait and mobility (R26.89);Difficulty in walking, not elsewhere classified (R26.2)     Time: 1610-9604 PT Time Calculation (min) (ACUTE ONLY): 41 min  Charges:  $Gait Training: 8-22 mins $Therapeutic Exercise: 8-22 mins $Self Care/Home Management: 8-22                     Rica Koyanagi  PTA Acute  Rehabilitation Services Pager      [phone] Office      [phone]

## 2018-03-01 NOTE — Care Management Note (Signed)
Case Management Note  Patient Details  Name: Dana Bradford MRN: [id number] Date of Birth: 02/19/1931  Subjective/Objective:     Spoke with patient at bedside. Confirmed plan for HEP. Has RW and 3n1. [phone]               Action/Plan:   Expected Discharge Date:  03/01/18               Expected Discharge Plan:  Home/Self Care  In-House Referral:  NA  Discharge planning Services  CM Consult  Post Acute Care Choice:  NA Choice offered to:  Patient  DME Arranged:  N/A DME Agency:  NA  HH Arranged:  NA HH Agency:  NA  Status of Service:  Completed, signed off  If discussed at [place] of Stay Meetings, dates discussed:    Additional Comments:  Guadalupe Maple, RN 03/01/2018, 9:40 AM

## 2018-03-02 ENCOUNTER — Encounter (HOSPITAL_COMMUNITY): Payer: Self-pay | Admitting: Orthopedic Surgery

## 2018-03-02 LAB — CBC
HEMATOCRIT: 32.5 % — AB (ref 36.0–46.0)
Hemoglobin: 10.4 g/dL — ABNORMAL LOW (ref 12.0–15.0)
MCH: 31.6 pg (ref 26.0–34.0)
MCHC: 32 g/dL (ref 30.0–36.0)
MCV: 98.8 fL (ref 80.0–100.0)
Platelets: 228 10*3/uL (ref 150–400)
RBC: 3.29 MIL/uL — ABNORMAL LOW (ref 3.87–5.11)
RDW: 12.6 % (ref 11.5–15.5)
WBC: 14.1 10*3/uL — ABNORMAL HIGH (ref 4.0–10.5)
nRBC: 0 % (ref 0.0–0.2)

## 2018-03-02 LAB — BASIC METABOLIC PANEL
Anion gap: 8 (ref 5–15)
BUN: 23 mg/dL (ref 8–23)
CALCIUM: 8.7 mg/dL — AB (ref 8.9–10.3)
CO2: 27 mmol/L (ref 22–32)
Chloride: 102 mmol/L (ref 98–111)
Creatinine, Ser: 0.79 mg/dL (ref 0.44–1.00)
GFR calc Af Amer: >60 mL/min (ref >60)
GFR calc non Af Amer: >60 mL/min (ref >60)
Glucose, Bld: 128 mg/dL — ABNORMAL HIGH (ref 70–99)
Potassium: 3.7 mmol/L (ref 3.5–5.1)
Sodium: 137 mmol/L (ref 135–145)

## 2018-03-02 MED ORDER — HYDROCODONE-ACETAMINOPHEN 5-325 MG PO TABS: 1.0000 | ORAL_TABLET | Freq: Four times a day (QID) | ORAL | 0 refills | Status: DC | PRN

## 2018-03-02 MED ORDER — METHOCARBAMOL 500 MG PO TABS: 500.0000 mg | ORAL_TABLET | Freq: Four times a day (QID) | ORAL | 0 refills | Status: DC | PRN

## 2018-03-02 MED ORDER — ASPIRIN 325 MG PO TBEC: 325.0000 mg | DELAYED_RELEASE_TABLET | Freq: Every day | ORAL | 0 refills | Status: AC

## 2018-03-02 MED ORDER — TRAMADOL HCL 50 MG PO TABS: 50.0000 mg | ORAL_TABLET | Freq: Four times a day (QID) | ORAL | 0 refills | Status: DC | PRN

## 2018-03-02 NOTE — Progress Notes (Signed)
Physical Therapy Treatment Patient Details Name: Dana Bradford MRN: [id number] DOB: 29-Oct-1930 Today's Date: 03/02/2018    History of Present Illness 85 yo female s/p R DA-THA on 02/28/18. PMH includes GERD, HTN, RLS, mastectomy, spleenectomy.     PT Comments    POD # 1 am session Assisted OOB to amb to hallway, practiced stairs  then returned to room to Performed some TE's following HEP handout.  Instructed on proper tech, freq as well as use of ICE.     Follow Up Recommendations  Follow surgeon's recommendation for DC plan and follow-up therapies;Supervision for mobility/OOB     Equipment Recommendations  Rolling walker with 5" wheels(youth)    Recommendations for Other Services       Precautions / Restrictions Precautions Precautions: Fall Restrictions Weight Bearing Restrictions: No Other Position/Activity Restrictions: WBAT     Mobility  Bed Mobility Overal bed mobility: Needs Assistance Bed Mobility: Supine to Sit           General bed mobility comments: demonstarted and instructed pt how to use a belt to self assist R LE up onto bed.  Pt unable to fully self complete and required assist despite effort.  Transfers Overall transfer level: Needs assistance   Transfers: Sit to/from Stand Sit to Stand: Min assist         General transfer comment: very slow to rise with posterior lean.  Good use of hands to steady self but delayed balance coorection.  Also assisted to raised commode same fashion.  HIGH FALL RISK.   Ambulation/Gait Ambulation/Gait assistance: Min guard;Min assist Gait Distance (Feet): 28 Feet Assistive device: Rolling walker (2 wheeled)   Gait velocity: decreased    General Gait Details: VERY slow but steady gait.  Trial used SW as pt had concerns about the wheels yesterday.     Stairs    up backward with walker Min Assist          Wheelchair Mobility    Modified Rankin (Stroke Patients Only)       Balance                                             Cognition Arousal/Alertness: Awake/alert Behavior During Therapy: WFL for tasks assessed/performed Overall Cognitive Status: Within Functional Limits for tasks assessed                                        Exercises      General Comments        Pertinent Vitals/Pain Pain Assessment: 0-10 Pain Score: 6  Pain Location: R hip  Pain Descriptors / Indicators: Sore;Aching Pain Intervention(s): Monitored during session;Repositioned;Premedicated before session;Ice applied    Home Living                      Prior Function            PT Goals (current goals can now be found in the care plan section) Progress towards PT goals: Progressing toward goals    Frequency    7X/week      PT Plan Current plan remains appropriate    Co-evaluation              AM-PAC PT "6 Clicks" Mobility   Outcome Measure  Help  needed turning from your back to your side while in a flat bed without using bedrails?: A Little Help needed moving from lying on your back to sitting on the side of a flat bed without using bedrails?: A Little Help needed moving to and from a bed to a chair (including a wheelchair)?: A Little Help needed standing up from a chair using your arms (e.g., wheelchair or bedside chair)?: A Little Help needed to walk in hospital room?: A Little Help needed climbing 3-5 steps with a railing? : A Little 6 Click Score: 18    End of Session Equipment Utilized During Treatment: Gait belt Activity Tolerance: Patient tolerated treatment well Patient left: in chair;with call bell/phone within reach;with chair alarm set Nurse Communication: Mobility status PT Visit Diagnosis: Other abnormalities of gait and mobility (R26.89);Difficulty in walking, not elsewhere classified (R26.2)     Time: 0930-1000 PT Time Calculation (min) (ACUTE ONLY): 30 min  Charges:  $Gait Training: 8-22 mins $Therapeutic  Exercise: 8-22 mins $Self Care/Home Management: 8-22                     Rica Koyanagi  PTA Acute  Rehabilitation Services Pager      [phone] Office      [phone]

## 2018-03-02 NOTE — Progress Notes (Signed)
Physical Therapy Treatment Patient Details Name: Dana Bradford MRN: [id number] DOB: 08/15/1930 Today's Date: 03/02/2018    History of Present Illness 85 yo female s/p R DA-THA on 02/28/18. PMH includes GERD, HTN, RLS, mastectomy, spleenectomy.     PT Comments    POD # 1 pm session Assisted with amb to and from bathroom then back to bed to Performed some TE's following HEP handout.  Instructed on proper tech, freq as well as use of ICE.   Addressed all mobility questions, discussed appropriate activity, educated on use of ICE.  Pt ready for D/C to home.   Follow Up Recommendations  Follow surgeon's recommendation for DC plan and follow-up therapies;Supervision for mobility/OOB     Equipment Recommendations  Rolling walker with 5" wheels(youth)    Recommendations for Other Services       Precautions / Restrictions Precautions Precautions: Fall Restrictions Weight Bearing Restrictions: No Other Position/Activity Restrictions: WBAT     Mobility  Bed Mobility Overal bed mobility: Needs Assistance Bed Mobility: Supine to Sit           General bed mobility comments: demonstarted and instructed pt how to use a belt to self assist R LE up onto bed.  Pt unable to fully self complete and required assist despite effort.  Transfers Overall transfer level: Needs assistance   Transfers: Sit to/from Stand Sit to Stand: Min assist         General transfer comment: very slow to rise with posterior lean.  Good use of hands to steady self but delayed balance coorection.  Also assisted to raised commode same fashion.  HIGH FALL RISK.   Ambulation/Gait Ambulation/Gait assistance: Min guard;Min assist Gait Distance (Feet): 28 Feet Assistive device: Rolling walker (2 wheeled)   Gait velocity: decreased    General Gait Details: VERY slow but steady gait.  Trial used SW as pt had concerns about the wheels yesterday.     Stairs             Wheelchair Mobility    Modified  Rankin (Stroke Patients Only)       Balance                                            Cognition Arousal/Alertness: Awake/alert Behavior During Therapy: WFL for tasks assessed/performed Overall Cognitive Status: Within Functional Limits for tasks assessed                                        Exercises   Total Hip Replacement TE's 10 reps ankle pumps 10 reps knee presses 10 reps heel slides 10 reps SAQ's 10 reps ABD Followed by ICE     General Comments        Pertinent Vitals/Pain Pain Assessment: 0-10 Pain Score: 6  Pain Location: R hip  Pain Descriptors / Indicators: Sore;Aching Pain Intervention(s): Monitored during session;Repositioned;Premedicated before session;Ice applied    Home Living                      Prior Function            PT Goals (current goals can now be found in the care plan section) Progress towards PT goals: Progressing toward goals    Frequency    7X/week  PT Plan Current plan remains appropriate    Co-evaluation              AM-PAC PT "6 Clicks" Mobility   Outcome Measure  Help needed turning from your back to your side while in a flat bed without using bedrails?: A Little Help needed moving from lying on your back to sitting on the side of a flat bed without using bedrails?: A Little Help needed moving to and from a bed to a chair (including a wheelchair)?: A Little Help needed standing up from a chair using your arms (e.g., wheelchair or bedside chair)?: A Little Help needed to walk in hospital room?: A Little Help needed climbing 3-5 steps with a railing? : A Little 6 Click Score: 18    End of Session Equipment Utilized During Treatment: Gait belt Activity Tolerance: Patient tolerated treatment well Patient left: in chair;with call bell/phone within reach;with chair alarm set Nurse Communication: Mobility status PT Visit Diagnosis: Other abnormalities of gait and  mobility (R26.89);Difficulty in walking, not elsewhere classified (R26.2)     Time: 1500-1530 PT Time Calculation (min) (ACUTE ONLY): 30 min  Charges:  $Gait Training: 8-22 mins $Therapeutic Exercise: 8-22 mins                     Rica Koyanagi  PTA Acute  Rehabilitation Services Pager      [phone] Office      [phone]

## 2018-03-02 NOTE — Care Management Note (Signed)
Case Management Note  Patient Details  Name: Dana Bradford MRN: [id number] Date of Birth: 02-11-1931  Subjective/Objective:     Discharge planning, spoke with patient at bedside. Have chosen [facility], evaluate and treat.               Action/Plan: [facility] for [facility]. Has DME. [phone]   Expected Discharge Date:  03/02/18               Expected Discharge Plan:  [place]  In-House Referral:  NA  Discharge planning Services  CM Consult  Post Acute Care Choice:  NA Choice offered to:  Patient  DME Arranged:  N/A DME Agency:  NA  HH Arranged:  PT [place] Agency:  [facility] (formerly [employer])  Status of Service:  Completed, signed off  If discussed at [employer], dates discussed:    Additional Comments:  Guadalupe Maple, RN 03/02/2018, 11:00 AM

## 2018-03-02 NOTE — Progress Notes (Signed)
Assumed care of patient at 1100. VSS and pt is resting comfortably in chair in no apparent distress or pain. Pt to be discharged home after second round of PT this afternoon. Pt's daughter will be transporting patient home. Daughter works until [employer] and pt reports daughter will be at hospital to pick her up no earlier than 445/5. Will continue to monitor with hourly rounding.

## 2018-03-02 NOTE — Care Management Important Message (Signed)
Important Message  Patient Details  Name: Dana Bradford MRN: [id number] Date of Birth: [birth year]   Medicare Important Message Given:  Yes    [name] 03/02/2018, 1:25 PMImportant Message  Patient Details  Name: Dana Bradford MRN: [id number] Date of Birth: 09-Jul-1930   Medicare Important Message Given:  Yes    [name] 03/02/2018, 1:25 PM

## 2018-03-02 NOTE — Progress Notes (Signed)
   Subjective: 2 Days Post-Op Procedure(s) (LRB): RIGHT TOTAL HIP ARTHROPLASTY ANTERIOR APPROACH (Right) Patient reports pain as mild.   Patient seen in rounds by Dr. Wynelle Link. Patient is well, and has had no acute complaints or problems. No issues overnight. Voiding without difficulty and positive flatus. Denies chest pain or SOB. Plan is to go Home after hospital stay.  Objective: Vital signs in last 24 hours: Temp:  [98.1 F (36.7 C)-98.7 F (37.1 C)] 98.2 F (36.8 C) (01/10 0538) Pulse Rate:  [65-67] 66 (01/10 0538) Resp:  [12-16] 16 (01/10 0538) BP: (94-128)/(61-72) 126/63 (01/10 0538) SpO2:  [95 %-98 %] 96 % (01/10 0538)  Intake/Output from previous day:  Intake/Output Summary (Last 24 hours) at 03/02/2018 0713 Last data filed at 03/02/2018 0600 Gross per 24 hour  Intake 1499.66 ml  Output 1300 ml  Net 199.66 ml   Labs: Recent Labs    03/01/18 0522 03/02/18 0534  HGB 11.5* 10.4*   Recent Labs    03/01/18 0522 03/02/18 0534  WBC 14.2* 14.1*  RBC 3.56* 3.29*  HCT 34.9* 32.5*  PLT 252 228   Recent Labs    03/01/18 0522 03/02/18 0534  NA 137 137  K 3.7 3.7  CL 102 102  CO2 27 27  BUN 25* 23  CREATININE 0.78 0.79  GLUCOSE 141* 128*  CALCIUM 8.5* 8.7*   Exam: General - Patient is Alert and Oriented Extremity - Neurologically intact Neurovascular intact Sensation intact distally Dorsiflexion/Plantar flexion intact Dressing/Incision - clean, dry, no drainage Motor Function - intact, moving foot and toes well on exam.   Past Medical History:  Diagnosis Date  . Acute kidney failure ([place]) 2016 to 2017 due to vacomycin   no kidney issues now  . Arthritis   . Food allergy    Fish (Hives)   . Glaucoma   . Hypertension   . Impaired fasting glucose   . Other specified acquired hypothyroidism   . Restless leg   . Restless leg   . 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   Weight as of this encounter: 72.6 kg. Up with therapy D/C IV fluids  DVT Prophylaxis - Aspirin and Plavix Weight-bearing as tolerated  Plan for discharge to home after two sessions of therapy today with HHPT. Follow-up in the office in 2 weeks with Dr. Wynelle Link.  Theresa Duty, PA-C Orthopedic Surgery 03/02/2018, 7:13 AM

## 2018-03-02 NOTE — Progress Notes (Signed)
03/02/2018  1815  Reviewed discharge instructions with patient and her daughter. Both verbalized understanding of discharge instructions. Copy of discharge instructions, prescription, and walker were given to patient.

## 2018-03-02 NOTE — Anesthesia Postprocedure Evaluation (Signed)
Anesthesia Post Note  Patient: Dana Bradford  Procedure(s) Performed: RIGHT TOTAL HIP ARTHROPLASTY ANTERIOR APPROACH (Right Hip)     Patient location during evaluation: PACU Anesthesia Type: MAC and Spinal Level of consciousness: awake and alert Pain management: pain level controlled Vital Signs Assessment: post-procedure vital signs reviewed and stable Respiratory status: spontaneous breathing, nonlabored ventilation, respiratory function stable and patient connected to nasal cannula oxygen Cardiovascular status: stable and blood pressure returned to baseline Postop Assessment: no apparent nausea or vomiting and spinal receding Anesthetic complications: no    Last Vitals:  Vitals:   03/01/18 2120 03/02/18 0538  BP: 128/66 126/63  Pulse: 66 66  Resp: 12 16  Temp: 37.1 C 36.8 C  SpO2: 98% 96%    Last Pain:  Vitals:   03/02/18 0606  TempSrc:   PainSc: 5                  [name]

## 2018-03-05 NOTE — Discharge Summary (Signed)
Physician Discharge Summary   Patient ID: Dana Bradford MRN: [id number] DOB/AGE: 83-Sep-1932 83 y.o.  Admit date: 02/28/2018 Discharge date: 03/02/2018  Primary Diagnosis: Osteoarthritis, right hip   Admission Diagnoses:  Past Medical History:  Diagnosis Date  . Acute kidney failure ([place]) 2016 to 2017 due to vacomycin   no kidney issues now  . Arthritis   . Food allergy    Fish (H[place])   . Glaucoma   . Hypertension   . Impaired fasting glucose   . Other specified acquired hypothyroidism   . Restless leg   . Restless leg   . Stroke ([place]) 05/2017   ocular stoke on plavix , right eye some vision loss   Discharge Diagnoses:   Principal Problem:   OA (osteoarthritis) of hip  Estimated body mass index is 33.44 kg/m as calculated from the following:   Height as of this encounter: 4\' 10"  (1.473 m).   Weight as of this encounter: 72.6 kg.  Procedure:  Procedure(s) (LRB): RIGHT TOTAL HIP ARTHROPLASTY ANTERIOR APPROACH (Right)   Consults: None  HPI: Dana Bradford is a 85 y.o. female who has advanced end-stage arthritis of their Right  hip with progressively worsening pain and dysfunction.The patient has failed nonoperative management and presents for total hip arthroplasty.   Laboratory Data: Admission on 02/28/2018, Discharged on 03/02/2018  Component Date Value Ref Range Status  . Glucose-Capillary 02/28/2018 119* 70 - 99 mg/dL Final  . Glucose-Capillary 02/28/2018 102* 70 - 99 mg/dL Final  . WBC 03/01/2018 14.2* 4.0 - 10.5 K/uL Final  . RBC 03/01/2018 3.56* 3.87 - 5.11 MIL/uL Final  . Hemoglobin 03/01/2018 11.5* 12.0 - 15.0 g/dL Final  . HCT 03/01/2018 34.9* 36.0 - 46.0 % Final  . MCV 03/01/2018 98.0  80.0 - 100.0 fL Final  . MCH 03/01/2018 32.3  26.0 - 34.0 pg Final  . MCHC 03/01/2018 33.0  30.0 - 36.0 g/dL Final  . RDW 03/01/2018 12.2  11.5 - 15.5 % Final  . Platelets 03/01/2018 252  150 - 400 K/uL Final  . nRBC 03/01/2018 0.0  0.0 - 0.2 % Final   Performed at [facility], [place] [address] [place], [place] [address]  . Sodium 03/01/2018 137  135 - 145 mmol/L Final  . Potassium 03/01/2018 3.7  3.5 - 5.1 mmol/L Final  . Chloride 03/01/2018 102  98 - 111 mmol/L Final  . CO2 03/01/2018 27  22 - 32 mmol/L Final  . Glucose, Bld 03/01/2018 141* 70 - 99 mg/dL Final  . BUN 03/01/2018 25* 8 - 23 mg/dL Final  . Creatinine, Ser 03/01/2018 0.78  0.44 - 1.00 mg/dL Final  . Calcium 03/01/2018 8.5* 8.9 - 10.3 mg/dL Final  . GFR calc non Af Amer 03/01/2018 >60  >60 mL/min Final  . GFR calc Af Amer 03/01/2018 >60  >60 mL/min Final  . Anion gap 03/01/2018 8  5 - 15 Final   Performed at [facility], [place] [address], [place], [place] [address]  . WBC 03/02/2018 14.1* 4.0 - 10.5 K/uL Final  . RBC 03/02/2018 3.29* 3.87 - 5.11 MIL/uL Final  . Hemoglobin 03/02/2018 10.4* 12.0 - 15.0 g/dL Final  . HCT 03/02/2018 32.5* 36.0 - 46.0 % Final  . MCV 03/02/2018 98.8  80.0 - 100.0 fL Final  . MCH 03/02/2018 31.6  26.0 - 34.0 pg Final  . MCHC 03/02/2018 32.0  30.0 - 36.0 g/dL Final  . RDW 03/02/2018 12.6  11.5 - 15.5 % Final  .  Platelets 03/02/2018 228  150 - 400 K/uL Final  . nRBC 03/02/2018 0.0  0.0 - 0.2 % Final   Performed at [facility], [place] [address] [place], [place] [address]  . Sodium 03/02/2018 137  135 - 145 mmol/L Final  . Potassium 03/02/2018 3.7  3.5 - 5.1 mmol/L Final  . Chloride 03/02/2018 102  98 - 111 mmol/L Final  . CO2 03/02/2018 27  22 - 32 mmol/L Final  . Glucose, Bld 03/02/2018 128* 70 - 99 mg/dL Final  . BUN 03/02/2018 23  8 - 23 mg/dL Final  . Creatinine, Ser 03/02/2018 0.79  0.44 - 1.00 mg/dL Final  . Calcium 03/02/2018 8.7* 8.9 - 10.3 mg/dL Final  . GFR calc non Af Amer 03/02/2018 >60  >60 mL/min Final  . GFR calc Af Amer 03/02/2018 >60  >60 mL/min Final  . Anion gap 03/02/2018 8  5 - 15 Final   Performed at [facility], [place] [address], [place], [place] [address]  Hospital  Outpatient Visit on 02/26/2018  Component Date Value Ref Range Status  . MRSA, PCR 02/26/2018 NEGATIVE  NEGATIVE Final  . Staphylococcus aureus 02/26/2018 NEGATIVE  NEGATIVE Final   Comment: (NOTE) The Xpert SA Assay (FDA approved for NASAL specimens in patients 26 years of age and older), is one component of a comprehensive surveillance program. It is not intended to diagnose infection nor to guide or monitor treatment. Performed at [facility], [place] [address] [place], [place] [address]   . aPTT 02/26/2018 28  24 - 36 seconds Final   Performed at [facility], [place] [address], [place], [place] [address]  . WBC 02/26/2018 8.1  4.0 - 10.5 K/uL Final  . RBC 02/26/2018 3.92  3.87 - 5.11 MIL/uL Final  . Hemoglobin 02/26/2018 12.6  12.0 - 15.0 g/dL Final  . HCT 02/26/2018 39.2  36.0 - 46.0 % Final  . MCV 02/26/2018 100.0  80.0 - 100.0 fL Final  . MCH 02/26/2018 32.1  26.0 - 34.0 pg Final  . MCHC 02/26/2018 32.1  30.0 - 36.0 g/dL Final  . RDW 02/26/2018 12.5  11.5 - 15.5 % Final  . Platelets 02/26/2018 248  150 - 400 K/uL Final  . nRBC 02/26/2018 0.0  0.0 - 0.2 % Final   Performed at [facility], [place] [address], [place], [place] [address]  . Sodium 02/26/2018 140  135 - 145 mmol/L Final  . Potassium 02/26/2018 3.9  3.5 - 5.1 mmol/L Final  . Chloride 02/26/2018 102  98 - 111 mmol/L Final  . CO2 02/26/2018 28  22 - 32 mmol/L Final  . Glucose, Bld 02/26/2018 112* 70 - 99 mg/dL Final  . BUN 02/26/2018 35* 8 - 23 mg/dL Final  . Creatinine, Ser 02/26/2018 1.04* 0.44 - 1.00 mg/dL Final  . Calcium 02/26/2018 9.2  8.9 - 10.3 mg/dL Final  . Total Protein 02/26/2018 7.3  6.5 - 8.1 g/dL Final  . Albumin 02/26/2018 4.0  3.5 - 5.0 g/dL Final  . AST 02/26/2018 22  15 - 41 U/L Final  . ALT 02/26/2018 19  0 - 44 U/L Final  . Alkaline Phosphatase 02/26/2018 66  38 - 126 U/L Final  . Total Bilirubin 02/26/2018 0.7  0.3 - 1.2 mg/dL Final  . GFR  calc non Af Amer 02/26/2018 49* >60 mL/min Final  . GFR calc Af Amer 02/26/2018 57* >60 mL/min Final  . Anion gap 02/26/2018 10  5 - 15 Final   Performed at [facility], [place] [address] [place], [place] [address]  . Prothrombin Time 02/26/2018 11.5  11.4 - 15.2 seconds Final  . INR 02/26/2018 0.85   Final   Performed at [place] [address], [place], [place] [address]  . ABO/RH(D) 02/26/2018 A POS   Final  . Antibody Screen 02/26/2018 NEG   Final  . Sample Expiration 02/26/2018 03/03/2018   Final  . Extend sample reason 02/26/2018    Final                   Value:NO TRANSFUSIONS OR PREGNANCY IN THE PAST 3 MONTHS Performed at [facility], [place] [address], [place], [place] [address]   . Hgb A1c MFr Bld 02/26/2018 5.7* 4.8 - 5.6 % Final   Comment: (NOTE) Pre diabetes:          5.7%-6.4% Diabetes:              >6.4% Glycemic control for   <7.0% adults with diabetes   . Mean Plasma Glucose 02/26/2018 116.89  mg/dL Final   Performed at [place] [address] [place], [place] [address]  . Glucose-Capillary 02/26/2018 102* 70 - 99 mg/dL Final  . ABO/RH(D) 02/26/2018    Final                   Value:A POS Performed at [facility], [place] [address] [place],  [address]      X-Rays:Dg Pelvis Portable  Result Date: 02/28/2018 CLINICAL DATA:  Status post right hip replacement today. EXAM: PORTABLE PELVIS 1-2 VIEWS COMPARISON:  Intraoperative imaging earlier today. FINDINGS: Right total hip arthroplasty is in place. The device is located. No fracture. Surgical drain and gas in the soft tissues noted. Advanced left hip osteoarthritis is seen. IMPRESSION: Status post right total hip replacement.  No acute finding. Advanced left hip osteoarthritis. Electronically Signed   By: Inge Rise M.D.   On: 02/28/2018 15:11   Dg C-arm 1-60 Min-no Report  Result Date: 02/28/2018 Fluoroscopy was  utilized by the requesting physician.  No radiographic interpretation.   Dg Hip Operative Unilat W Or W/o Pelvis Right  Result Date: 02/28/2018 CLINICAL DATA:  Right hip replacement. EXAM: OPERATIVE right HIP (WITH PELVIS IF PERFORMED) 2 VIEWS TECHNIQUE: Fluoroscopic spot image(s) were submitted for interpretation post-operatively. Radiation exposure index: 0.5206 mGy. COMPARISON:  None. FINDINGS: Two intraoperative fluoroscopic images of the right hip demonstrate the patient be status post right total hip arthroplasty. The femoral and acetabular components appear to be well situated. IMPRESSION: Status post right total hip arthroplasty. Electronically Signed   By: Marijo Conception, M.D.   On: 02/28/2018 14:02    EKG:No orders found for this or any previous visit.   Hospital Course: Reighn Kaplan is a 85 y.o. who was admitted to [facility] They were brought to the operating room on 02/28/2018 and underwent Procedure(s): RIGHT TOTAL HIP ARTHROPLASTY ANTERIOR APPROACH.  Patient tolerated the procedure well and was later transferred to the recovery room and then to the orthopaedic floor for postoperative care. They were given PO and IV analgesics for pain control following their surgery. They were given 24 hours of postoperative antibiotics of  Anti-infectives (From admission, onward)   Start     Dose/Rate Route Frequency Ordered Stop   02/28/18 1830  ceFAZolin (ANCEF) IVPB 1 g/50 mL premix     1  g 100 mL/hr over 30 Minutes Intravenous Every 6 hours 02/28/18 1527 03/01/18 0015   02/28/18 1115  ceFAZolin (ANCEF) IVPB 2g/100 mL premix     2 g 200 mL/hr over 30 Minutes Intravenous On call to O.R. 02/28/18 1111 02/28/18 1258     and started on DVT prophylaxis in the form of Aspirin and Plavix.   PT and OT were ordered for total joint protocol. Discharge planning consulted to help with postop disposition and equipment needs. Patient had a decent night on the evening of surgery. They started to get  up OOB with therapy on POD #1. Hemovac drain was pulled without difficulty on day one. Continued to work with therapy into POD #2. Pt was seen during rounds on day two and was ready to go home pending progress with therapy. Dressing was changed and the incision was clean, dry, and intact with no drainage. Pt worked with therapy for two additional sessions and was meeting their goals. She was discharged to home later that day in stable condition.  Diet: Regular diet Activity: WBAT Follow-up: in 2 weeks with Dr. Wynelle Link Disposition: Home with HHPT Discharged Condition: stable   Discharge Instructions    Call MD / Call 911   Complete by:  As directed    If you experience chest pain or shortness of breath, CALL 911 and be transported to the hospital emergency room.  If you develope a fever above 101 F, pus (white drainage) or increased drainage or redness at the wound, or calf pain, call your surgeon's office.   Change dressing   Complete by:  As directed    You may change your dressing on Friday, then change the dressing daily with sterile 4 x 4 inch gauze dressing and paper tape.   Constipation Prevention   Complete by:  As directed    Drink plenty of fluids.  Prune juice may be helpful.  You may use a stool softener, such as Colace (over the counter) 100 mg twice a day.  Use MiraLax (over the counter) for constipation as needed.   Diet - low sodium heart healthy   Complete by:  As directed    Discharge instructions   Complete by:  As directed    Dr. Gaynelle Arabian Total Joint Specialist [facility] [address] [place], [place] [address] [phone]  ANTERIOR APPROACH TOTAL HIP REPLACEMENT POSTOPERATIVE DIRECTIONS   Hip Rehabilitation, Guidelines Following Surgery  The results of a hip operation are greatly improved after range of motion and muscle strengthening exercises. Follow all safety measures which are given to protect your hip. If any of these exercises cause  increased pain or swelling in your joint, decrease the amount until you are comfortable again. Then slowly increase the exercises. Call your caregiver if you have problems or questions.   HOME CARE INSTRUCTIONS  Remove items at home which could result in a fall. This includes throw rugs or furniture in walking pathways.  ICE to the affected hip every three hours for 30 minutes at a time and then as needed for pain and swelling.  Continue to use ice on the hip for pain and swelling from surgery. You may notice swelling that will progress down to the foot and ankle.  This is normal after surgery.  Elevate the leg when you are not up walking on it.   Continue to use the breathing machine which will help keep your temperature down.  It is common for your temperature to cycle  up and down following surgery, especially at night when you are not up moving around and exerting yourself.  The breathing machine keeps your lungs expanded and your temperature down.  DIET You may resume your previous home diet once your are discharged from the hospital.  DRESSING / WOUND CARE / SHOWERING You may shower 3 days after surgery, but keep the wounds dry during showering.  You may use an occlusive plastic wrap (Press'n Seal for example), NO SOAKING/SUBMERGING IN THE BATHTUB.  If the bandage gets wet, change with a clean dry gauze.  If the incision gets wet, pat the wound dry with a clean towel. You may start showering once you are discharged home but do not submerge the incision under water. Just pat the incision dry and apply a dry gauze dressing on daily. Change the surgical dressing daily and reapply a dry dressing each time.  ACTIVITY Walk with your walker as instructed. Use walker as long as suggested by your caregivers. Avoid periods of inactivity such as sitting longer than an hour when not asleep. This helps prevent blood clots.  You may resume a sexual relationship in one month or when given the OK by your  doctor.  You may return to work once you are cleared by your doctor.  Do not drive a car for 6 weeks or until released by you surgeon.  Do not drive while taking narcotics.  WEIGHT BEARING Weight bearing as tolerated with assist device (walker, cane, etc) as directed, use it as long as suggested by your surgeon or therapist, typically at least 4-6 weeks.  POSTOPERATIVE CONSTIPATION PROTOCOL Constipation - defined medically as fewer than three stools per week and severe constipation as less than one stool per week.  One of the most common issues patients have following surgery is constipation.  Even if you have a regular bowel pattern at home, your normal regimen is likely to be disrupted due to multiple reasons following surgery.  Combination of anesthesia, postoperative narcotics, change in appetite and fluid intake all can affect your bowels.  In order to avoid complications following surgery, here are some recommendations in order to help you during your recovery period.  Colace (docusate) - Pick up an over-the-counter form of Colace or another stool softener and take twice a day as long as you are requiring postoperative pain medications.  Take with a full glass of water daily.  If you experience loose stools or diarrhea, hold the colace until you stool forms back up.  If your symptoms do not get better within 1 week or if they get worse, check with your doctor.  Dulcolax (bisacodyl) - Pick up over-the-counter and take as directed by the product packaging as needed to assist with the movement of your bowels.  Take with a full glass of water.  Use this product as needed if not relieved by Colace only.   MiraLax (polyethylene glycol) - Pick up over-the-counter to have on hand.  MiraLax is a solution that will increase the amount of water in your bowels to assist with bowel movements.  Take as directed and can mix with a glass of water, juice, soda, coffee, or tea.  Take if you go more than two  days without a movement. Do not use MiraLax more than once per day. Call your doctor if you are still constipated or irregular after using this medication for 7 days in a row.  If you continue to have problems with postoperative constipation, please contact the office  for further assistance and recommendations.  If you experience "the worst abdominal pain ever" or develop nausea or vomiting, please contact the office immediatly for further recommendations for treatment.  ITCHING  If you experience itching with your medications, try taking only a single pain pill, or even half a pain pill at a time.  You can also use Benadryl over the counter for itching or also to help with sleep.   TED HOSE STOCKINGS Wear the elastic stockings on both legs for three weeks following surgery during the day but you may remove then at night for sleeping.  MEDICATIONS See your medication summary on the "After Visit Summary" that the nursing staff will review with you prior to discharge.  You may have some home medications which will be placed on hold until you complete the course of blood thinner medication.  It is important for you to complete the blood thinner medication as prescribed by your surgeon.  Continue your approved medications as instructed at time of discharge.  PRECAUTIONS If you experience chest pain or shortness of breath - call 911 immediately for transfer to the hospital emergency department.  If you develop a fever greater that 101 F, purulent drainage from wound, increased redness or drainage from wound, foul odor from the wound/dressing, or calf pain - CONTACT YOUR SURGEON.                                                   FOLLOW-UP APPOINTMENTS Make sure you keep all of your appointments after your operation with your surgeon and caregivers. You should call the office at the above phone number and make an appointment for approximately two weeks after the date of your surgery or on the date  instructed by your surgeon outlined in the "After Visit Summary".  RANGE OF MOTION AND STRENGTHENING EXERCISES  These exercises are designed to help you keep full movement of your hip joint. Follow your caregiver's or physical therapist's instructions. Perform all exercises about fifteen times, three times per day or as directed. Exercise both hips, even if you have had only one joint replacement. These exercises can be done on a training (exercise) mat, on the floor, on a table or on a bed. Use whatever works the best and is most comfortable for you. Use music or television while you are exercising so that the exercises are a pleasant break in your day. This will make your life better with the exercises acting as a break in routine you can look forward to.  Lying on your back, slowly slide your foot toward your buttocks, raising your knee up off the floor. Then slowly slide your foot back down until your leg is straight again.  Lying on your back spread your legs as far apart as you can without causing discomfort.  Lying on your side, raise your upper leg and foot straight up from the floor as far as is comfortable. Slowly lower the leg and repeat.  Lying on your back, tighten up the muscle in the front of your thigh (quadriceps muscles). You can do this by keeping your leg straight and trying to raise your heel off the floor. This helps strengthen the largest muscle supporting your knee.  Lying on your back, tighten up the muscles of your buttocks both with the legs straight and with the knee bent  at a comfortable angle while keeping your heel on the floor.   IF YOU ARE TRANSFERRED TO A SKILLED REHAB FACILITY If the patient is transferred to a skilled rehab facility following release from the hospital, a list of the current medications will be sent to the facility for the patient to continue.  When discharged from the skilled rehab facility, please have the facility set up the patient's [place] prior to being released. Also, the skilled facility will be responsible for providing the patient with their medications at time of release from the facility to include their pain medication, the muscle relaxants, and their blood thinner medication. If the patient is still at the rehab facility at time of the two week follow up appointment, the skilled rehab facility will also need to assist the patient in arranging follow up appointment in our office and any transportation needs.  MAKE SURE YOU:  Understand these instructions.  Get help right away if you are not doing well or get worse.    Pick up stool softner and laxative for home use following surgery while on pain medications. Do not submerge incision under water. Please use good hand washing techniques while changing dressing each day. May shower starting three days after surgery. Please use a clean towel to pat the incision dry following showers. Continue to use ice for pain and swelling after surgery. Do not use any lotions or creams on the incision until instructed by your surgeon.   Do not sit on low chairs, stoools or toilet seats, as it may be difficult to get up from low surfaces   Complete by:  As directed    Driving restrictions   Complete by:  As directed    No driving for two weeks   TED hose   Complete by:  As directed    Use stockings (TED hose) for three weeks on both leg(s).  You may remove them at night for sleeping.   Weight bearing as tolerated   Complete by:  As directed      Allergies as of 03/02/2018      Reactions   Amlodipine    angioedema   Arthrotec [diclofenac-misoprostol] Diarrhea   Shortness of breath   Celebrex [celecoxib] Other (See Comments)   GI Bleeding   Shellfish Allergy    Itchy rash   Vancomycin    Acute kidney failure      Medication List    TAKE these medications   acetaminophen 500 MG tablet Commonly known as:  TYLENOL Take 1,000 mg by mouth daily.   aspirin  325 MG EC tablet Take 1 tablet (325 mg total) by mouth daily with breakfast for 19 days. Then take one 81 mg aspirin once a day for three weeks. Then discontinue aspirin.   brimonidine 0.2 % ophthalmic solution Commonly known as:  ALPHAGAN Place 1 drop into both eyes 2 (two) times daily.   clopidogrel 75 MG tablet Commonly known as:  PLAVIX Take 75 mg by mouth daily.   ferrous sulfate 325 (65 FE) MG tablet Take 325 mg by mouth 2 (two) times daily.   furosemide 40 MG tablet Commonly known as:  LASIX Take 40 mg by mouth daily as needed (fluid retention.).   HYDROcodone-acetaminophen 5-325 MG tablet Commonly known as:  NORCO/VICODIN Take 1-2 tablets by mouth every 6 (six) hours as needed for severe pain.   irbesartan-hydrochlorothiazide 300-12.5 MG tablet Commonly known as:  AVALIDE Take 1 tablet by mouth daily.  latanoprost 0.005 % ophthalmic solution Commonly known as:  XALATAN Place 1 drop into both eyes at bedtime.   levothyroxine 88 MCG tablet Commonly known as:  SYNTHROID, LEVOTHROID Take 88 mcg by mouth daily before breakfast.   metFORMIN 500 MG 24 hr tablet Commonly known as:  GLUCOPHAGE-XR Take 500 mg by mouth at bedtime.   methocarbamol 500 MG tablet Commonly known as:  ROBAXIN Take 1 tablet (500 mg total) by mouth every 6 (six) hours as needed for muscle spasms.   metoprolol succinate 50 MG 24 hr tablet Commonly known as:  TOPROL-XL Take 50 mg by mouth at bedtime. Take with or immediately following a meal.   pramipexole 1 MG tablet Commonly known as:  MIRAPEX Take 1 mg by mouth at bedtime.   simvastatin 20 MG tablet Commonly known as:  ZOCOR Take 20 mg by mouth every evening.   SYSTANE 0.4-0.3 % Gel ophthalmic gel Generic drug:  Polyethyl Glycol-Propyl Glycol Place 1 application into both eyes at bedtime.   SYSTANE COMPLETE 0.6 % Soln Generic drug:  Propylene Glycol Place 1 drop into both eyes 4 (four) times daily.   timolol 0.25 % ophthalmic  solution Commonly known as:  BETIMOL Place 1 drop into both eyes 2 (two) times daily.   traMADol 50 MG tablet Commonly known as:  ULTRAM Take 1-2 tablets (50-100 mg total) by mouth every 6 (six) hours as needed for moderate pain.   triamcinolone ointment 0.1 % Commonly known as:  KENALOG Apply 1 application topically 2 (two) times daily as needed (skin irritation.).   vitamin C 500 MG tablet Commonly known as:  ASCORBIC ACID Take 500 mg by mouth 2 (two) times daily.   Vitamin D3 50 MCG (2000 UT) Tabs Take 2,000 Units by mouth daily.            Discharge Care Instructions  (From admission, onward)         Start     Ordered   03/01/18 0000  Weight bearing as tolerated     03/01/18 0717   03/01/18 0000  Change dressing    Comments:  You may change your dressing on Friday, then change the dressing daily with sterile 4 x 4 inch gauze dressing and paper tape.   03/01/18 3568         Follow-up Information    Gaynelle Arabian, MD. Schedule an appointment as soon as possible for a visit on 03/15/2018.   Specialty:  Orthopedic Surgery Contact information: [address] [place] [address] [phone]        [facility] Follow up.   Specialty:  Home Health Services Why:  physical therapy Contact information: [address] [place] [address] [phone]           Signed: Theresa Duty, PA-C Orthopedic Surgery 03/05/2018, 9:14 AM

## 2018-04-02 DIAGNOSIS — Z96641 Presence of right artificial hip joint: Secondary | ICD-10-CM | POA: Insufficient documentation

## 2018-06-05 DIAGNOSIS — I872 Venous insufficiency (chronic) (peripheral): Secondary | ICD-10-CM | POA: Insufficient documentation

## 2018-06-05 HISTORY — DX: Venous insufficiency (chronic) (peripheral): I87.2

## 2019-01-15 ENCOUNTER — Other Ambulatory Visit: Payer: Self-pay

## 2019-01-15 ENCOUNTER — Encounter (HOSPITAL_BASED_OUTPATIENT_CLINIC_OR_DEPARTMENT_OTHER): Payer: Self-pay

## 2019-01-15 ENCOUNTER — Emergency Department (HOSPITAL_BASED_OUTPATIENT_CLINIC_OR_DEPARTMENT_OTHER)
Admission: EM | Admit: 2019-01-15 | Discharge: 2019-01-16 | Disposition: A | Discharge status: Home / Self Care | Payer: Medicare Other | Attending: Emergency Medicine | Admitting: Emergency Medicine

## 2019-01-15 DIAGNOSIS — L97421 Non-pressure chronic ulcer of left heel and midfoot limited to breakdown of skin: Secondary | ICD-10-CM | POA: Insufficient documentation

## 2019-01-15 DIAGNOSIS — E11621 Type 2 diabetes mellitus with foot ulcer: Secondary | ICD-10-CM

## 2019-01-15 DIAGNOSIS — I10 Essential (primary) hypertension: Secondary | ICD-10-CM | POA: Insufficient documentation

## 2019-01-15 DIAGNOSIS — Z8673 Personal history of transient ischemic attack (TIA), and cerebral infarction without residual deficits: Secondary | ICD-10-CM | POA: Insufficient documentation

## 2019-01-15 DIAGNOSIS — E1169 Type 2 diabetes mellitus with other specified complication: Secondary | ICD-10-CM | POA: Diagnosis not present

## 2019-01-15 DIAGNOSIS — M79672 Pain in left foot: Secondary | ICD-10-CM | POA: Diagnosis present

## 2019-01-15 MED ORDER — SULFAMETHOXAZOLE-TRIMETHOPRIM 800-160 MG PO TABS: 1.0000 | ORAL_TABLET | Freq: Two times a day (BID) | ORAL | 0 refills | Status: AC

## 2019-01-15 NOTE — ED Provider Notes (Signed)
[facility] Emergency Department Provider Note MRN:  [id number]  Arrival date & time: 01/15/19     Chief Complaint   Foot Pain   History of Present Illness   Dana Bradford is a 85 y.o. year-old female with a history of diabetes, stroke presenting to the ED with chief complaint of foot pain.  Patient has had a ulcer to the top of the left foot for years.  Increased pain for the past few months.  Change to the skin for the past few days.  Pain is worse with motion or palpation.  Denies fever, no chest pain or shortness of breath, no abdominal pain, no other complaints.  Review of Systems  A complete 10 system review of systems was obtained and all systems are negative except as noted in the HPI and PMH.   Patient's Health History    Past Medical History:  Diagnosis Date  . Acute kidney failure ([place]) 2016 to 2017 due to vacomycin   no kidney issues now  . Arthritis   . Food allergy    Fish (Hives)   . Glaucoma   . Hypertension   . Impaired fasting glucose   . Other specified acquired hypothyroidism   . Restless leg   . Restless leg   . Stroke ([place]) 05/2017   ocular stoke on plavix , right eye some vision loss    Past Surgical History:  Procedure Laterality Date  . ABDOMINAL HYSTERECTOMY  1993   Uterine Prolapse partial  . MASTECTOMY  1974   right breast not sure if cancer 1960's  . SMALL TOE RIGHT FOOT REMOVED  04/2017   DUE TO INFECTION  . Spleenectomy  2000  . TOTAL HIP ARTHROPLASTY Right 02/28/2018   Procedure: RIGHT TOTAL HIP ARTHROPLASTY ANTERIOR APPROACH;  Surgeon: Gaynelle Arabian, MD;  Location: [facility];  Service: Orthopedics;  Laterality: Right;  198min    Family History  Problem Relation Age of Onset  . Cancer Mother   . Heart disease Father   . Hyperlipidemia Father     Social History   Socioeconomic History  . Marital status: Widowed    Spouse name: Not on file  . Number of children: 2  . Years of education: 11  . Highest  education level: Not on file  Occupational History  . Occupation: Retired   Scientific laboratory technician  . Financial resource strain: Not on file  . Food insecurity    Worry: Not on file    Inability: Not on file  . Transportation needs    Medical: Not on file    Non-medical: Not on file  Tobacco Use  . Smoking status: Former Smoker    Packs/day: 1.00    Years: 30.00    Pack years: 30.00    Types: Cigarettes  . Smokeless tobacco: Never Used  . Tobacco comment: qiot 50 yrs ago  Substance and Sexual Activity  . Alcohol use: Not Currently  . Drug use: No  . Sexual activity: Not on file  Lifestyle  . Physical activity    Days per week: Not on file    Minutes per session: Not on file  . Stress: Not on file  Relationships  . Social Herbalist on phone: Not on file    Gets together: Not on file    Attends religious service: Not on file    Active member of club or organization: Not on file    Attends meetings of clubs or organizations:  Not on file    Relationship status: Not on file  . Intimate partner violence    Fear of current or ex partner: Not on file    Emotionally abused: Not on file    Physically abused: Not on file    Forced sexual activity: Not on file  Other Topics Concern  . Not on file  Social History Narrative   Marital Status: Widowed    Children:  Daughter Barbaraann Share) Son Maxima Schmeltz)   Pets: None    Living Situation: Lives alone    Occupation: Retired    Education: Programmer, systems    Tobacco:  She quit smoking 35 years ago after having smoked 1 ppd for 20 years.      Alcohol Use:  One glass of wine twice a week     Drug Use:  None   Diet:  Regular   Exercise:  None   Hobbies: Crafting, Gardening.               Physical Exam  Vital Signs and Nursing Notes reviewed Vitals:   01/15/19 2140  BP: (!) 162/96  Pulse: 89  Resp: 18  Temp: 99 F (37.2 C)  SpO2: 100%    CONSTITUTIONAL: Well-appearing, NAD NEURO:  Alert and oriented x 3, no  focal deficits EYES:  eyes equal and reactive ENT/NECK:  no LAD, no JVD CARDIO: Regular rate, well-perfused, normal S1 and S2 PULM:  CTAB no wheezing or rhonchi GI/GU:  normal bowel sounds, non-distended, non-tender MSK/SPINE:  No gross deformities, no edema SKIN: 1 cm ulcer to the dorsum of the left foot with surrounding mild erythema PSYCH:  Appropriate speech and behavior  Diagnostic and Interventional Summary    EKG Interpretation  Date/Time:    Ventricular Rate:    PR Interval:    QRS Duration:   QT Interval:    QTC Calculation:   R Axis:     Text Interpretation:        Labs Reviewed - No data to display  No orders to display    Medications - No data to display   Procedures  /  Critical Care Procedures  ED Course and Medical Decision Making  I have reviewed the triage vital signs and the nursing notes.  Pertinent labs & imaging results that were available during my care of the patient were reviewed by me and considered in my medical decision making (see below for details).     Normal vital signs, no fever, nothing to suggest systemic infection.  Doubt osteomyelitis or deep space infection.  Strong peripheral pulses bilateral dorsalis pedis.  Neurovascularly intact distally.  Given the increased pain and recent change the skin, will cover with antibiotics, advised Tylenol at home, advised PCP follow-up.    Barth Kirks. [place] Small, [place] [email or web address]  Final Clinical Impressions(s) / ED Diagnoses     ICD-10-CM   1. Diabetic ulcer of left midfoot associated with type 2 diabetes mellitus, limited to breakdown of skin [facility])  E11.621    L97.421     ED Discharge Orders         Ordered    sulfamethoxazole-trimethoprim (BACTRIM DS) 800-160 MG tablet  2 times daily     01/15/19 2351           Discharge Instructions Discussed with and Provided to Patient:     Discharge Instructions     You were  evaluated in the Emergency Department and after  careful evaluation, we did not find any emergent condition requiring admission or further testing in the hospital.  Your exam/testing today is overall reassuring.  It is possible that your ulcer is becoming more painful due to infection.  Please take the antibiotics as directed and follow-up with your primary care doctor.  We recommend Tylenol at home, 1000 mg every 6 hours for discomfort.  Please return to the Emergency Department if you experience any worsening of your condition.  We encourage you to follow up with a primary care provider.  Thank you for allowing [place] to be a part of your care.      Maudie Flakes, MD 01/15/19 [phone]

## 2019-01-15 NOTE — Discharge Instructions (Addendum)
You were evaluated in the Emergency Department and after careful evaluation, we did not find any emergent condition requiring admission or further testing in the hospital.  Your exam/testing today is overall reassuring.  It is possible that your ulcer is becoming more painful due to infection.  Please take the antibiotics as directed and follow-up with your primary care doctor.  We recommend Tylenol at home, 1000 mg every 6 hours for discomfort.  Please return to the Emergency Department if you experience any worsening of your condition.  We encourage you to follow up with a primary care provider.  Thank you for allowing [place] to be a part of your care.

## 2019-01-15 NOTE — ED Triage Notes (Signed)
Pt c/o redness and swelling to left foot with bloody drainage from left foot wound-wound to foot x 1 year-NAD-to triage in w/c

## 2019-02-08 DIAGNOSIS — E11621 Type 2 diabetes mellitus with foot ulcer: Secondary | ICD-10-CM | POA: Insufficient documentation

## 2019-02-08 HISTORY — DX: Type 2 diabetes mellitus with foot ulcer: E11.621

## 2019-03-01 DIAGNOSIS — I872 Venous insufficiency (chronic) (peripheral): Secondary | ICD-10-CM

## 2019-03-01 HISTORY — DX: Venous insufficiency (chronic) (peripheral): I87.2

## 2019-07-01 MED ORDER — FAMOTIDINE 20 MG PO TABS
40.00 | ORAL_TABLET | ORAL | Status: DC
Start: ? — End: 2019-07-01

## 2019-07-01 MED ORDER — PRAMIPEXOLE DIHYDROCHLORIDE 1 MG PO TABS
1.00 | ORAL_TABLET | ORAL | Status: DC
Start: 2019-07-01 — End: 2019-07-01

## 2019-07-01 MED ORDER — INSULIN LISPRO 100 UNIT/ML ~~LOC~~ SOLN
1.00 | SUBCUTANEOUS | Status: DC
Start: 2019-07-01 — End: 2019-07-01

## 2019-07-01 MED ORDER — DEXTROSE 10 % IV SOLN
125.00 | INTRAVENOUS | Status: DC
Start: ? — End: 2019-07-01

## 2019-07-01 MED ORDER — ATORVASTATIN CALCIUM 10 MG PO TABS
10.00 | ORAL_TABLET | ORAL | Status: DC
Start: 2019-07-01 — End: 2019-07-01

## 2019-07-01 MED ORDER — GENERIC EXTERNAL MEDICATION
37.50 | Status: DC
Start: 2019-07-01 — End: 2019-07-01

## 2019-07-01 MED ORDER — HYDROCHLOROTHIAZIDE 12.5 MG PO CAPS
12.50 | ORAL_CAPSULE | ORAL | Status: DC
Start: 2019-07-02 — End: 2019-07-01

## 2019-07-01 MED ORDER — LATANOPROST 0.005 % OP SOLN
1.00 | OPHTHALMIC | Status: DC
Start: 2019-07-01 — End: 2019-07-01

## 2019-07-01 MED ORDER — SULFAMETHOXAZOLE-TRIMETHOPRIM 800-160 MG PO TABS
1.00 | ORAL_TABLET | ORAL | Status: DC
Start: 2019-07-01 — End: 2019-07-01

## 2019-07-01 MED ORDER — LEVOTHYROXINE SODIUM 88 MCG PO TABS
88.00 | ORAL_TABLET | ORAL | Status: DC
Start: 2019-07-02 — End: 2019-07-01

## 2019-07-01 MED ORDER — APIXABAN 2.5 MG PO TABS
5.00 | ORAL_TABLET | ORAL | Status: DC
Start: 2019-07-01 — End: 2019-07-01

## 2019-07-01 MED ORDER — GLUCOSE 40 % PO GEL
15.00 | ORAL | Status: DC
Start: ? — End: 2019-07-01

## 2019-08-23 ENCOUNTER — Other Ambulatory Visit: Payer: Self-pay

## 2019-08-23 ENCOUNTER — Emergency Department (HOSPITAL_COMMUNITY): Payer: Medicare Other

## 2019-08-23 ENCOUNTER — Inpatient Hospital Stay (HOSPITAL_COMMUNITY)
Admission: EM | Admit: 2019-08-23 | Discharge: 2019-08-27 | DRG: 194 | Disposition: A | Discharge status: Home / Self Care | Payer: Medicare Other | Attending: Family Medicine | Admitting: Family Medicine

## 2019-08-23 ENCOUNTER — Encounter (HOSPITAL_COMMUNITY): Payer: Self-pay | Admitting: Emergency Medicine

## 2019-08-23 DIAGNOSIS — R05 Cough: Secondary | ICD-10-CM | POA: Diagnosis not present

## 2019-08-23 DIAGNOSIS — M549 Dorsalgia, unspecified: Secondary | ICD-10-CM | POA: Diagnosis present

## 2019-08-23 DIAGNOSIS — E038 Other specified hypothyroidism: Secondary | ICD-10-CM | POA: Diagnosis present

## 2019-08-23 DIAGNOSIS — J189 Pneumonia, unspecified organism: Secondary | ICD-10-CM | POA: Diagnosis not present

## 2019-08-23 DIAGNOSIS — I959 Hypotension, unspecified: Secondary | ICD-10-CM | POA: Diagnosis not present

## 2019-08-23 DIAGNOSIS — R1013 Epigastric pain: Secondary | ICD-10-CM

## 2019-08-23 DIAGNOSIS — Z7901 Long term (current) use of anticoagulants: Secondary | ICD-10-CM

## 2019-08-23 DIAGNOSIS — I110 Hypertensive heart disease with heart failure: Secondary | ICD-10-CM | POA: Diagnosis present

## 2019-08-23 DIAGNOSIS — Z8249 Family history of ischemic heart disease and other diseases of the circulatory system: Secondary | ICD-10-CM

## 2019-08-23 DIAGNOSIS — I10 Essential (primary) hypertension: Secondary | ICD-10-CM | POA: Diagnosis present

## 2019-08-23 DIAGNOSIS — E785 Hyperlipidemia, unspecified: Secondary | ICD-10-CM | POA: Diagnosis present

## 2019-08-23 DIAGNOSIS — G2581 Restless legs syndrome: Secondary | ICD-10-CM | POA: Diagnosis present

## 2019-08-23 DIAGNOSIS — R059 Cough, unspecified: Secondary | ICD-10-CM

## 2019-08-23 DIAGNOSIS — D490 Neoplasm of unspecified behavior of digestive system: Secondary | ICD-10-CM | POA: Diagnosis present

## 2019-08-23 DIAGNOSIS — I4891 Unspecified atrial fibrillation: Secondary | ICD-10-CM

## 2019-08-23 DIAGNOSIS — Z87891 Personal history of nicotine dependence: Secondary | ICD-10-CM

## 2019-08-23 DIAGNOSIS — I5032 Chronic diastolic (congestive) heart failure: Secondary | ICD-10-CM | POA: Diagnosis present

## 2019-08-23 DIAGNOSIS — Z9081 Acquired absence of spleen: Secondary | ICD-10-CM

## 2019-08-23 DIAGNOSIS — Z96641 Presence of right artificial hip joint: Secondary | ICD-10-CM | POA: Diagnosis present

## 2019-08-23 DIAGNOSIS — K219 Gastro-esophageal reflux disease without esophagitis: Secondary | ICD-10-CM | POA: Diagnosis present

## 2019-08-23 DIAGNOSIS — I872 Venous insufficiency (chronic) (peripheral): Secondary | ICD-10-CM | POA: Diagnosis present

## 2019-08-23 DIAGNOSIS — E876 Hypokalemia: Secondary | ICD-10-CM | POA: Diagnosis present

## 2019-08-23 DIAGNOSIS — E039 Hypothyroidism, unspecified: Secondary | ICD-10-CM | POA: Diagnosis present

## 2019-08-23 DIAGNOSIS — G8929 Other chronic pain: Secondary | ICD-10-CM | POA: Diagnosis present

## 2019-08-23 DIAGNOSIS — Z7984 Long term (current) use of oral hypoglycemic drugs: Secondary | ICD-10-CM

## 2019-08-23 DIAGNOSIS — R9389 Abnormal findings on diagnostic imaging of other specified body structures: Secondary | ICD-10-CM

## 2019-08-23 DIAGNOSIS — Z79899 Other long term (current) drug therapy: Secondary | ICD-10-CM

## 2019-08-23 DIAGNOSIS — K862 Cyst of pancreas: Secondary | ICD-10-CM | POA: Diagnosis present

## 2019-08-23 DIAGNOSIS — R531 Weakness: Secondary | ICD-10-CM

## 2019-08-23 DIAGNOSIS — H539 Unspecified visual disturbance: Secondary | ICD-10-CM | POA: Diagnosis present

## 2019-08-23 DIAGNOSIS — T502X5A Adverse effect of carbonic-anhydrase inhibitors, benzothiadiazides and other diuretics, initial encounter: Secondary | ICD-10-CM | POA: Diagnosis not present

## 2019-08-23 DIAGNOSIS — Z9011 Acquired absence of right breast and nipple: Secondary | ICD-10-CM

## 2019-08-23 DIAGNOSIS — Y92239 Unspecified place in hospital as the place of occurrence of the external cause: Secondary | ICD-10-CM | POA: Diagnosis not present

## 2019-08-23 DIAGNOSIS — I4819 Other persistent atrial fibrillation: Secondary | ICD-10-CM | POA: Diagnosis present

## 2019-08-23 DIAGNOSIS — I69398 Other sequelae of cerebral infarction: Secondary | ICD-10-CM

## 2019-08-23 DIAGNOSIS — N179 Acute kidney failure, unspecified: Secondary | ICD-10-CM | POA: Diagnosis not present

## 2019-08-23 DIAGNOSIS — K8689 Other specified diseases of pancreas: Secondary | ICD-10-CM

## 2019-08-23 DIAGNOSIS — Z83438 Family history of other disorder of lipoprotein metabolism and other lipidemia: Secondary | ICD-10-CM

## 2019-08-23 DIAGNOSIS — Z96653 Presence of artificial knee joint, bilateral: Secondary | ICD-10-CM | POA: Diagnosis present

## 2019-08-23 DIAGNOSIS — Z20822 Contact with and (suspected) exposure to covid-19: Secondary | ICD-10-CM | POA: Diagnosis present

## 2019-08-23 DIAGNOSIS — R7989 Other specified abnormal findings of blood chemistry: Secondary | ICD-10-CM | POA: Diagnosis present

## 2019-08-23 DIAGNOSIS — N131 Hydronephrosis with ureteral stricture, not elsewhere classified: Secondary | ICD-10-CM | POA: Diagnosis present

## 2019-08-23 DIAGNOSIS — M545 Low back pain: Secondary | ICD-10-CM | POA: Diagnosis present

## 2019-08-23 LAB — CBC WITH DIFFERENTIAL/PLATELET
Abs Immature Granulocytes: 0.06 10*3/uL (ref 0.00–0.07)
Basophils Absolute: 0.1 10*3/uL (ref 0.0–0.1)
Basophils Relative: 1 %
Eosinophils Absolute: 0.1 10*3/uL (ref 0.0–0.5)
Eosinophils Relative: 0 %
HCT: 39.6 % (ref 36.0–46.0)
Hemoglobin: 12.7 g/dL (ref 12.0–15.0)
Immature Granulocytes: 1 %
Lymphocytes Relative: 9 %
Lymphs Abs: 1.2 10*3/uL (ref 0.7–4.0)
MCH: 30.4 pg (ref 26.0–34.0)
MCHC: 32.1 g/dL (ref 30.0–36.0)
MCV: 94.7 fL (ref 80.0–100.0)
Monocytes Absolute: 1.2 10*3/uL — ABNORMAL HIGH (ref 0.1–1.0)
Monocytes Relative: 9 %
Neutro Abs: 10.6 10*3/uL — ABNORMAL HIGH (ref 1.7–7.7)
Neutrophils Relative %: 80 %
Platelets: 220 10*3/uL (ref 150–400)
RBC: 4.18 MIL/uL (ref 3.87–5.11)
RDW: 14.6 % (ref 11.5–15.5)
WBC: 13.3 10*3/uL — ABNORMAL HIGH (ref 4.0–10.5)
nRBC: 0 % (ref 0.0–0.2)

## 2019-08-23 LAB — COMPREHENSIVE METABOLIC PANEL
ALT: 20 U/L (ref 0–44)
AST: 25 U/L (ref 15–41)
Albumin: 3.2 g/dL — ABNORMAL LOW (ref 3.5–5.0)
Alkaline Phosphatase: 67 U/L (ref 38–126)
Anion gap: 11 (ref 5–15)
BUN: 15 mg/dL (ref 8–23)
CO2: 23 mmol/L (ref 22–32)
Calcium: 9 mg/dL (ref 8.9–10.3)
Chloride: 102 mmol/L (ref 98–111)
Creatinine, Ser: 0.83 mg/dL (ref 0.44–1.00)
GFR calc Af Amer: >60 mL/min (ref >60)
GFR calc non Af Amer: >60 mL/min (ref >60)
Glucose, Bld: 108 mg/dL — ABNORMAL HIGH (ref 70–99)
Potassium: 3.6 mmol/L (ref 3.5–5.1)
Sodium: 136 mmol/L (ref 135–145)
Total Bilirubin: 1.2 mg/dL (ref 0.3–1.2)
Total Protein: 7 g/dL (ref 6.5–8.1)

## 2019-08-23 LAB — LIPASE, BLOOD: Lipase: 20 U/L (ref 11–51)

## 2019-08-23 LAB — TROPONIN I (HIGH SENSITIVITY): Troponin I (High Sensitivity): 9 ng/L (ref <18)

## 2019-08-23 MED ORDER — APIXABAN 5 MG PO TABS
5.0000 mg | ORAL_TABLET | Freq: Once | ORAL | Status: AC
  Administered 2019-08-24: 5 mg via ORAL
  Filled 2019-08-23: qty 1

## 2019-08-23 MED ORDER — METOPROLOL TARTRATE 5 MG/5ML IV SOLN
5.0000 mg | Freq: Once | INTRAVENOUS | Status: AC
  Administered 2019-08-24: 5 mg via INTRAVENOUS
  Filled 2019-08-23: qty 5

## 2019-08-23 NOTE — ED Triage Notes (Signed)
Pt here from home via [facility] for lower R flank pain, fatigue, nausea, decreased appetite. 3 weeks ago pt had cellulitis to bilateral lower legs. 3 days ago pt developed flank pain, denies UTI s/s. Pt lives alone, uses a walker, states she's too tired and weak to get up. Hx of afib, did not take medications today. AOx4, VSS

## 2019-08-23 NOTE — ED Notes (Signed)
Pt denies pain at this time. States pain worsens with movement

## 2019-08-24 ENCOUNTER — Emergency Department (HOSPITAL_COMMUNITY): Payer: Medicare Other

## 2019-08-24 ENCOUNTER — Encounter (HOSPITAL_COMMUNITY): Payer: Self-pay | Admitting: Radiology

## 2019-08-24 DIAGNOSIS — I4891 Unspecified atrial fibrillation: Secondary | ICD-10-CM | POA: Diagnosis not present

## 2019-08-24 DIAGNOSIS — I110 Hypertensive heart disease with heart failure: Secondary | ICD-10-CM | POA: Diagnosis present

## 2019-08-24 DIAGNOSIS — Z8249 Family history of ischemic heart disease and other diseases of the circulatory system: Secondary | ICD-10-CM | POA: Diagnosis not present

## 2019-08-24 DIAGNOSIS — E038 Other specified hypothyroidism: Secondary | ICD-10-CM | POA: Diagnosis present

## 2019-08-24 DIAGNOSIS — G8929 Other chronic pain: Secondary | ICD-10-CM | POA: Diagnosis present

## 2019-08-24 DIAGNOSIS — Z20822 Contact with and (suspected) exposure to covid-19: Secondary | ICD-10-CM | POA: Diagnosis present

## 2019-08-24 DIAGNOSIS — R531 Weakness: Secondary | ICD-10-CM

## 2019-08-24 DIAGNOSIS — Z9081 Acquired absence of spleen: Secondary | ICD-10-CM | POA: Diagnosis not present

## 2019-08-24 DIAGNOSIS — D490 Neoplasm of unspecified behavior of digestive system: Secondary | ICD-10-CM | POA: Diagnosis present

## 2019-08-24 DIAGNOSIS — I4819 Other persistent atrial fibrillation: Secondary | ICD-10-CM | POA: Diagnosis present

## 2019-08-24 DIAGNOSIS — G2581 Restless legs syndrome: Secondary | ICD-10-CM | POA: Diagnosis present

## 2019-08-24 DIAGNOSIS — K219 Gastro-esophageal reflux disease without esophagitis: Secondary | ICD-10-CM

## 2019-08-24 DIAGNOSIS — I10 Essential (primary) hypertension: Secondary | ICD-10-CM

## 2019-08-24 DIAGNOSIS — Z96641 Presence of right artificial hip joint: Secondary | ICD-10-CM | POA: Diagnosis present

## 2019-08-24 DIAGNOSIS — I5032 Chronic diastolic (congestive) heart failure: Secondary | ICD-10-CM | POA: Diagnosis present

## 2019-08-24 DIAGNOSIS — J189 Pneumonia, unspecified organism: Secondary | ICD-10-CM | POA: Diagnosis present

## 2019-08-24 DIAGNOSIS — I69398 Other sequelae of cerebral infarction: Secondary | ICD-10-CM | POA: Diagnosis not present

## 2019-08-24 DIAGNOSIS — M5441 Lumbago with sciatica, right side: Secondary | ICD-10-CM

## 2019-08-24 DIAGNOSIS — K862 Cyst of pancreas: Secondary | ICD-10-CM | POA: Diagnosis present

## 2019-08-24 DIAGNOSIS — N179 Acute kidney failure, unspecified: Secondary | ICD-10-CM | POA: Diagnosis not present

## 2019-08-24 DIAGNOSIS — H539 Unspecified visual disturbance: Secondary | ICD-10-CM | POA: Diagnosis present

## 2019-08-24 DIAGNOSIS — Z83438 Family history of other disorder of lipoprotein metabolism and other lipidemia: Secondary | ICD-10-CM | POA: Diagnosis not present

## 2019-08-24 DIAGNOSIS — N131 Hydronephrosis with ureteral stricture, not elsewhere classified: Secondary | ICD-10-CM | POA: Diagnosis present

## 2019-08-24 DIAGNOSIS — Y92239 Unspecified place in hospital as the place of occurrence of the external cause: Secondary | ICD-10-CM | POA: Diagnosis not present

## 2019-08-24 DIAGNOSIS — Z79899 Other long term (current) drug therapy: Secondary | ICD-10-CM | POA: Diagnosis not present

## 2019-08-24 DIAGNOSIS — Z7901 Long term (current) use of anticoagulants: Secondary | ICD-10-CM | POA: Diagnosis not present

## 2019-08-24 DIAGNOSIS — Z7984 Long term (current) use of oral hypoglycemic drugs: Secondary | ICD-10-CM | POA: Diagnosis not present

## 2019-08-24 DIAGNOSIS — M549 Dorsalgia, unspecified: Secondary | ICD-10-CM | POA: Diagnosis present

## 2019-08-24 DIAGNOSIS — R05 Cough: Secondary | ICD-10-CM | POA: Diagnosis present

## 2019-08-24 DIAGNOSIS — Z9011 Acquired absence of right breast and nipple: Secondary | ICD-10-CM | POA: Diagnosis not present

## 2019-08-24 DIAGNOSIS — Z87891 Personal history of nicotine dependence: Secondary | ICD-10-CM | POA: Diagnosis not present

## 2019-08-24 LAB — CBC
HCT: 35 % — ABNORMAL LOW (ref 36.0–46.0)
Hemoglobin: 11.5 g/dL — ABNORMAL LOW (ref 12.0–15.0)
MCH: 30.7 pg (ref 26.0–34.0)
MCHC: 32.9 g/dL (ref 30.0–36.0)
MCV: 93.3 fL (ref 80.0–100.0)
Platelets: 225 10*3/uL (ref 150–400)
RBC: 3.75 MIL/uL — ABNORMAL LOW (ref 3.87–5.11)
RDW: 14.5 % (ref 11.5–15.5)
WBC: 13 10*3/uL — ABNORMAL HIGH (ref 4.0–10.5)
nRBC: 0 % (ref 0.0–0.2)

## 2019-08-24 LAB — COMPREHENSIVE METABOLIC PANEL
ALT: 18 U/L (ref 0–44)
AST: 20 U/L (ref 15–41)
Albumin: 2.9 g/dL — ABNORMAL LOW (ref 3.5–5.0)
Alkaline Phosphatase: 59 U/L (ref 38–126)
Anion gap: 11 (ref 5–15)
BUN: 13 mg/dL (ref 8–23)
CO2: 22 mmol/L (ref 22–32)
Calcium: 8.7 mg/dL — ABNORMAL LOW (ref 8.9–10.3)
Chloride: 102 mmol/L (ref 98–111)
Creatinine, Ser: 0.79 mg/dL (ref 0.44–1.00)
GFR calc Af Amer: >60 mL/min (ref >60)
GFR calc non Af Amer: >60 mL/min (ref >60)
Glucose, Bld: 94 mg/dL (ref 70–99)
Potassium: 3.1 mmol/L — ABNORMAL LOW (ref 3.5–5.1)
Sodium: 135 mmol/L (ref 135–145)
Total Bilirubin: 1.3 mg/dL — ABNORMAL HIGH (ref 0.3–1.2)
Total Protein: 6.2 g/dL — ABNORMAL LOW (ref 6.5–8.1)

## 2019-08-24 LAB — SARS CORONAVIRUS 2 BY RT PCR (HOSPITAL ORDER, PERFORMED IN ~~LOC~~ HOSPITAL LAB): SARS Coronavirus 2: NEGATIVE

## 2019-08-24 LAB — APTT: aPTT: 38 seconds — ABNORMAL HIGH (ref 24–36)

## 2019-08-24 LAB — PHOSPHORUS: Phosphorus: 3 mg/dL (ref 2.5–4.6)

## 2019-08-24 LAB — MAGNESIUM: Magnesium: 1.3 mg/dL — ABNORMAL LOW (ref 1.7–2.4)

## 2019-08-24 LAB — PROTIME-INR
INR: 1.5 — ABNORMAL HIGH (ref 0.8–1.2)
Prothrombin Time: 17.3 seconds — ABNORMAL HIGH (ref 11.4–15.2)

## 2019-08-24 LAB — TROPONIN I (HIGH SENSITIVITY): Troponin I (High Sensitivity): 9 ng/L (ref <18)

## 2019-08-24 LAB — LACTIC ACID, PLASMA: Lactic Acid, Venous: 1.4 mmol/L (ref 0.5–1.9)

## 2019-08-24 MED ORDER — SIMVASTATIN 20 MG PO TABS
20.0000 mg | ORAL_TABLET | Freq: Every evening | ORAL | Status: DC
  Administered 2019-08-24 – 2019-08-26 (×3): 20 mg via ORAL
  Filled 2019-08-24 (×4): qty 1

## 2019-08-24 MED ORDER — METOPROLOL TARTRATE 5 MG/5ML IV SOLN
2.5000 mg | INTRAVENOUS | Status: DC | PRN
  Administered 2019-08-24: 2.5 mg via INTRAVENOUS
  Filled 2019-08-24: qty 5

## 2019-08-24 MED ORDER — FERROUS SULFATE 325 (65 FE) MG PO TABS
325.0000 mg | ORAL_TABLET | Freq: Two times a day (BID) | ORAL | Status: DC
  Administered 2019-08-24 – 2019-08-27 (×6): 325 mg via ORAL
  Filled 2019-08-24 (×7): qty 1

## 2019-08-24 MED ORDER — APIXABAN 5 MG PO TABS
5.0000 mg | ORAL_TABLET | Freq: Two times a day (BID) | ORAL | Status: DC
  Administered 2019-08-24 – 2019-08-27 (×6): 5 mg via ORAL
  Filled 2019-08-24 (×6): qty 1

## 2019-08-24 MED ORDER — ACETAMINOPHEN 650 MG RE SUPP: 650.0000 mg | Freq: Four times a day (QID) | RECTAL | Status: DC | PRN

## 2019-08-24 MED ORDER — IOHEXOL 300 MG/ML  SOLN
100.0000 mL | Freq: Once | INTRAMUSCULAR | Status: AC | PRN
  Administered 2019-08-24: 100 mL via INTRAVENOUS

## 2019-08-24 MED ORDER — METOPROLOL SUCCINATE ER 50 MG PO TB24
50.0000 mg | ORAL_TABLET | Freq: Two times a day (BID) | ORAL | Status: DC
  Administered 2019-08-24 (×2): 50 mg via ORAL
  Filled 2019-08-24 (×5): qty 1

## 2019-08-24 MED ORDER — BRIMONIDINE TARTRATE 0.2 % OP SOLN
1.0000 [drp] | Freq: Two times a day (BID) | OPHTHALMIC | Status: DC
  Administered 2019-08-24 – 2019-08-27 (×6): 1 [drp] via OPHTHALMIC
  Filled 2019-08-24: qty 5

## 2019-08-24 MED ORDER — POTASSIUM CHLORIDE CRYS ER 20 MEQ PO TBCR
40.0000 meq | EXTENDED_RELEASE_TABLET | ORAL | Status: AC
  Administered 2019-08-24 (×2): 40 meq via ORAL
  Filled 2019-08-24 (×2): qty 2

## 2019-08-24 MED ORDER — LEVOTHYROXINE SODIUM 88 MCG PO TABS
88.0000 ug | ORAL_TABLET | Freq: Every day | ORAL | Status: DC
  Administered 2019-08-25 – 2019-08-27 (×3): 88 ug via ORAL
  Filled 2019-08-24 (×3): qty 1

## 2019-08-24 MED ORDER — ACETAMINOPHEN 325 MG PO TABS: 650.0000 mg | ORAL_TABLET | Freq: Four times a day (QID) | ORAL | Status: DC | PRN

## 2019-08-24 MED ORDER — FUROSEMIDE 20 MG PO TABS
20.0000 mg | ORAL_TABLET | Freq: Every day | ORAL | Status: DC
  Administered 2019-08-24 – 2019-08-25 (×2): 20 mg via ORAL
  Filled 2019-08-24 (×2): qty 1

## 2019-08-24 MED ORDER — VITAMIN D 25 MCG (1000 UNIT) PO TABS
2000.0000 [IU] | ORAL_TABLET | Freq: Every day | ORAL | Status: DC
  Administered 2019-08-24 – 2019-08-27 (×4): 2000 [IU] via ORAL
  Filled 2019-08-24 (×4): qty 2

## 2019-08-24 MED ORDER — ACETAMINOPHEN 500 MG PO TABS
1000.0000 mg | ORAL_TABLET | Freq: Three times a day (TID) | ORAL | Status: AC
  Administered 2019-08-24 – 2019-08-27 (×9): 1000 mg via ORAL
  Filled 2019-08-24 (×9): qty 2

## 2019-08-24 MED ORDER — FAMOTIDINE 20 MG PO TABS
20.0000 mg | ORAL_TABLET | Freq: Two times a day (BID) | ORAL | Status: DC
  Administered 2019-08-24 – 2019-08-27 (×6): 20 mg via ORAL
  Filled 2019-08-24 (×6): qty 1

## 2019-08-24 MED ORDER — POLYETHYLENE GLYCOL 3350 17 G PO PACK
17.0000 g | PACK | Freq: Two times a day (BID) | ORAL | Status: DC
  Administered 2019-08-24 – 2019-08-27 (×4): 17 g via ORAL
  Filled 2019-08-24 (×5): qty 1

## 2019-08-24 MED ORDER — DILTIAZEM HCL 60 MG PO TABS
30.0000 mg | ORAL_TABLET | Freq: Four times a day (QID) | ORAL | Status: DC
  Administered 2019-08-24 – 2019-08-25 (×4): 30 mg via ORAL
  Filled 2019-08-24 (×4): qty 1

## 2019-08-24 MED ORDER — MAGNESIUM SULFATE 4 GM/100ML IV SOLN
4.0000 g | Freq: Once | INTRAVENOUS | Status: DC
  Filled 2019-08-24: qty 100

## 2019-08-24 MED ORDER — METHOCARBAMOL 500 MG PO TABS: 500.0000 mg | ORAL_TABLET | Freq: Four times a day (QID) | ORAL | Status: DC | PRN

## 2019-08-24 MED ORDER — DICLOFENAC SODIUM 1 % EX GEL
2.0000 g | Freq: Four times a day (QID) | CUTANEOUS | Status: DC
  Administered 2019-08-24 – 2019-08-27 (×10): 2 g via TOPICAL
  Filled 2019-08-24: qty 100

## 2019-08-24 MED ORDER — LATANOPROST 0.005 % OP SOLN
1.0000 [drp] | Freq: Every day | OPHTHALMIC | Status: DC
  Administered 2019-08-24 – 2019-08-26 (×3): 1 [drp] via OPHTHALMIC
  Filled 2019-08-24: qty 2.5

## 2019-08-24 MED ORDER — OXYCODONE HCL 5 MG PO TABS: 2.5000 mg | ORAL_TABLET | ORAL | Status: DC | PRN

## 2019-08-24 MED ORDER — TIMOLOL HEMIHYDRATE 0.25 % OP SOLN: 1.0000 [drp] | Freq: Two times a day (BID) | OPHTHALMIC | Status: DC

## 2019-08-24 MED ORDER — PRAMIPEXOLE DIHYDROCHLORIDE 1 MG PO TABS
1.0000 mg | ORAL_TABLET | Freq: Every day | ORAL | Status: DC
  Administered 2019-08-24 – 2019-08-26 (×3): 1 mg via ORAL
  Filled 2019-08-24 (×4): qty 1

## 2019-08-24 MED ORDER — TIMOLOL MALEATE 0.25 % OP SOLN
1.0000 [drp] | Freq: Two times a day (BID) | OPHTHALMIC | Status: DC
  Administered 2019-08-24 – 2019-08-27 (×6): 1 [drp] via OPHTHALMIC
  Filled 2019-08-24: qty 5

## 2019-08-24 NOTE — Evaluation (Signed)
Physical Therapy Evaluation Patient Details Name: Dana Bradford MRN: [id number] DOB: 12/18/1930 Today's Date: 08/24/2019   History of Present Illness  Pt is an 86 y/o female admitted secondary to back pain with no known mechanism of injury. Per MD, back pain is chronic despite pt denying this upon evaluation. PMH including but not limited to Afib (on Eliquis), venous stasis dermatitis, HLD, Hypothyroidism, hypertension, RLS, s/p bilateral knee replacement.    Clinical Impression  Pt presented supine in bed with HOB elevated, awake and willing to participate in therapy session. Prior to admission, pt reported that she ambulated with use of a RW or cane and was independent with ADLs. Pt lives alone in a single level house with three steps to enter. Pt stated that her daughter could stay with her upon d/c if needed. At the time of evaluation, pt overall moving well without the need for physical assistance. She was able to perform transfers and ambulate a short distance in her room with RW and min guard for safety. Of note, pt's HR fluctuating greatly throughout (lowest 119 bpm to highest at 157 bpm). Discussed recommendations of HHPT with pt; however, she did not seem very keen on the idea. PT will continue to f/u with pt acutely to progress mobility as tolerated per PT POC.    Follow Up Recommendations Home health PT;Supervision/Assistance - 24 hour;Other (comment) (24/7 initially)    Equipment Recommendations  None recommended by PT    Recommendations for Other Services       Precautions / Restrictions Precautions Precautions: Fall Precaution Comments: monitor HR Restrictions Weight Bearing Restrictions: No      Mobility  Bed Mobility Overal bed mobility: Modified Independent                Transfers Overall transfer level: Needs assistance Equipment used: Rolling walker (2 wheeled) Transfers: Sit to/from Stand Sit to Stand: Min guard         General transfer comment:  initial attempt to stand was unsuccessful and pt sat back down at EOB; successful second attempt with use of RW and min guard for safety  Ambulation/Gait Ambulation/Gait assistance: Min guard Gait Distance (Feet): 25 Feet Assistive device: Rolling walker (2 wheeled) Gait Pattern/deviations: Trunk flexed;Shuffle;Step-through pattern;Decreased step length - right;Decreased step length - left;Decreased stride length Gait velocity: decreased   General Gait Details: pt with slow, cautious gait with very small step lengths bilaterally and flexed trunk posture throughout; pt reporting this was close to her baseline but that she was "a little slower than normal"; mild instability but no overt LOB or need for physical assistance  Stairs            Wheelchair Mobility    Modified Rankin (Stroke Patients Only)       Balance Overall balance assessment: Needs assistance Sitting-balance support: Feet supported Sitting balance-Leahy Scale: Good     Standing balance support: During functional activity;Bilateral upper extremity supported;Single extremity supported Standing balance-Leahy Scale: Poor                               Pertinent Vitals/Pain Pain Assessment: Faces Faces Pain Scale: No hurt    Home Living Family/patient expects to be discharged to:: Private residence Living Arrangements: Alone Available Help at Discharge: Family;Available PRN/intermittently Type of Home: House Home Access: Stairs to enter Entrance Stairs-Rails: Psychiatric nurse of Steps: 3 Home Layout: One level Home Equipment: Walker - 2 wheels;Cane - single point;Shower  seat Additional Comments: reported that her daughter could stay with her if needed    Prior Function Level of Independence: Independent with assistive device(s)         Comments: ambulates with a RW or cane; daughter drives     Hand Dominance        Extremity/Trunk Assessment   Upper Extremity  Assessment Upper Extremity Assessment: Overall WFL for tasks assessed    Lower Extremity Assessment Lower Extremity Assessment: Generalized weakness       Communication   Communication: HOH  Cognition Arousal/Alertness: Awake/alert Behavior During Therapy: WFL for tasks assessed/performed Overall Cognitive Status: Within Functional Limits for tasks assessed                                 General Comments: not formally assessed but [facility] for general conversation      General Comments      Exercises     Assessment/Plan    PT Assessment Patient needs continued PT services  PT Problem List Decreased activity tolerance;Decreased balance;Decreased mobility;Decreased coordination;Decreased knowledge of use of DME;Decreased safety awareness;Decreased knowledge of precautions;Pain       PT Treatment Interventions DME instruction;Gait training;Stair training;Functional mobility training;Therapeutic activities;Therapeutic exercise;Balance training;Neuromuscular re-education;Patient/family education    PT Goals (Current goals can be found in the Care Plan section)  Acute Rehab PT Goals Patient Stated Goal: "go home" PT Goal Formulation: With patient Time For Goal Achievement: 09/07/19 Potential to Achieve Goals: Good    Frequency Min 3X/week   Barriers to discharge Decreased caregiver support      Co-evaluation               AM-PAC PT "6 Clicks" Mobility  Outcome Measure Help needed turning from your back to your side while in a flat bed without using bedrails?: None Help needed moving from lying on your back to sitting on the side of a flat bed without using bedrails?: None Help needed moving to and from a bed to a chair (including a wheelchair)?: A Little Help needed standing up from a chair using your arms (e.g., wheelchair or bedside chair)?: None Help needed to walk in hospital room?: A Little Help needed climbing 3-5 steps with a railing? : A  Lot 6 Click Score: 20    End of Session   Activity Tolerance: Patient tolerated treatment well Patient left: in bed;with call bell/phone within reach;with bed alarm set Nurse Communication: Mobility status PT Visit Diagnosis: Other abnormalities of gait and mobility (R26.89);Pain Pain - part of body:  (back)    Time: 6060-0459 PT Time Calculation (min) (ACUTE ONLY): 26 min   Charges:   PT Evaluation $PT Eval Moderate Complexity: 1 Mod PT Treatments $Gait Training: 8-22 mins        Anastasio Champion, DPT  Acute Rehabilitation Services Pager [phone] Office [place] 08/24/2019, 11:13 AM

## 2019-08-24 NOTE — Progress Notes (Signed)
Patient with a history of Afib with a heat rate fluctuating between 98 and 136 while RN is at bedside observing.MD notified on previous shift,orders written. PRN Metoprolol given as previously ordered. HR decreased to 96. Will continue to monitor.

## 2019-08-24 NOTE — ED Notes (Signed)
Report given to Norman RN °

## 2019-08-24 NOTE — H&P (Signed)
History and Physical  Dana Bradford ZOX:[id number] DOB: 01-05-1931 DOA: 08/23/2019  Referring physician: Montine Circle, PA-C PCP: Jonathon Resides, MD  Patient coming from: Home  Chief Complaint: Generalized weakness and back pain  HPI: Dana Bradford is a 86 y.o. female with medical history significant for Afib (on Eliquis), venous stasis dermatitis, HLD, Hypothyroidism, hypertension, RLS, s/p bilateral knee replacement who presents to the emergency department with several complaints, she complains of a 4 to 5-day onset of generalized weakness, back pain and abdominal pain.  Patient states that the back pain worsens with ambulation and this has resulted in limiting mobility as well as ability to care for self.  Abdominal pain was epigastric and right upper quadrant, this was associated with nausea without vomiting.  Patient also complained of difficulty in being able to control A. fib since diagnosis in February of this year, though she states that she did not take her medication (Eliquis or metoprolol) last night.  She states that she has been following with a cardiologist.  She also complained of intermittent constipation/diarrhea but she denies any diagnosis of IBS.  Patient denies fever, chills, chest pain, shortness of breath  ED Course:  In the emergency department, she was tachycardic, and intermittently tachypneic.  Work-up in the ED showed leukocytosis, Albumin 3.2.  CT abdomen and pelvis showed no acute abdominal pelvic abnormality, but showed cystic lesions in the pancreatic head and tail measuring up to 11 mm in size which could reflect pseudocysts or cystic pancreatic neoplasms.  She was treated with Eliquis and Metoprolol.  Hospitalist was asked to admit patient for further evaluation and management.  Review of Systems:  Constitutional: Generalized weakness. Negative for chills and fever.  HENT: Negative for ear pain and sore throat.   Eyes: Negative for pain and visual disturbance.    Respiratory: Negative for cough, chest tightness and shortness of breath.   Cardiovascular: Abnormal heart rhythm.  Negative for chest pain  Gastrointestinal: Negative for abdominal pain and vomiting.  Endocrine: Negative for polyphagia and polyuria.  Genitourinary: Negative for decreased urine volume, dysuria Musculoskeletal: Negative for arthralgias and back pain.  Skin: Negative for color change and rash.  Allergic/Immunologic: Negative for immunocompromised state.  Neurological: Negative for tremors, syncope, speech difficulty, weakness, light-headedness and headaches.  Hematological: Does not bruise/bleed easily.  All other systems reviewed and are negative    Past Medical History:  Diagnosis Date   Acute kidney failure ([place]) 2016 to 2017 due to vacomycin   no kidney issues now   Arthritis    Food allergy    Fish (Hives)    Glaucoma    Hypertension    Impaired fasting glucose    Other specified acquired hypothyroidism    Restless leg    Restless leg    Stroke ([place]) 05/2017   ocular stoke on plavix , right eye some vision loss   Past Surgical History:  Procedure Laterality Date   ABDOMINAL HYSTERECTOMY  1993   Uterine Prolapse partial   MASTECTOMY  1974   right breast not sure if cancer 1960's   SMALL TOE RIGHT FOOT REMOVED  04/2017   DUE TO INFECTION   Spleenectomy  2000   TOTAL HIP ARTHROPLASTY Right 02/28/2018   Procedure: RIGHT TOTAL HIP ARTHROPLASTY ANTERIOR APPROACH;  Surgeon: Gaynelle Arabian, MD;  Location: [facility];  Service: Orthopedics;  Laterality: Right;  162min    Social History:  reports that she has quit smoking. Her smoking use included cigarettes. She has a 30.00 pack-year  smoking history. She has never used smokeless tobacco. She reports previous alcohol use. She reports that she does not use drugs.   Allergies  Allergen Reactions   Doxycycline Nausea And Vomiting and Rash    Significant enough to d/c treatment and refuse to take  more Other reaction(s): Vomiting (intolerance) The first time she took it she had N/V and the second she had a rash.     Amlodipine     angioedema   Arthrotec [Diclofenac-Misoprostol] Diarrhea    Shortness of breath   Celebrex [Celecoxib] Other (See Comments)    GI Bleeding   Shellfish Allergy     Itchy rash   Vancomycin     Acute kidney failure   Diclofenac Diarrhea   Latex Rash    Family History  Problem Relation Age of Onset   Cancer Mother    Heart disease Father    Hyperlipidemia Father     Prior to Admission medications   Medication Sig Start Date End Date Taking? Authorizing Provider  acetaminophen (TYLENOL) 500 MG tablet Take 1,000 mg by mouth 2 (two) times daily as needed for mild pain or headache.    Yes [provider]  brimonidine (ALPHAGAN) 0.2 % ophthalmic solution Place 1 drop into both eyes 2 (two) times daily.   Yes [provider]  Cholecalciferol (VITAMIN D3) 50 MCG (2000 UT) TABS Take 2,000 Units by mouth daily.   Yes [provider]  diltiazem (TIAZAC) 180 MG 24 hr capsule Take 180 mg by mouth daily. 07/26/19  Yes [provider]  ELIQUIS 5 MG TABS tablet Take 5 mg by mouth 2 (two) times daily. 08/02/19  Yes [provider]  famotidine (PEPCID) 40 MG tablet Take 40 mg by mouth daily. 08/20/19  Yes [provider]  ferrous sulfate 325 (65 FE) MG tablet Take 325 mg by mouth 2 (two) times daily.   Yes [provider]  furosemide (LASIX) 20 MG tablet Take 20 mg by mouth daily.   Yes [provider]  irbesartan-hydrochlorothiazide (AVALIDE) 300-12.5 MG tablet Take 1 tablet by mouth daily.   Yes [provider]  latanoprost (XALATAN) 0.005 % ophthalmic solution Place 1 drop into both eyes at bedtime.   Yes [provider]  levothyroxine (SYNTHROID, LEVOTHROID) 88 MCG tablet Take 88 mcg by mouth daily before breakfast.   Yes [provider]  metFORMIN  (GLUCOPHAGE) 500 MG tablet Take 500 mg by mouth at bedtime. 06/11/19  Yes [provider]  metolazone (ZAROXOLYN) 2.5 MG tablet Take 2.5 mg by mouth every Monday. 07/26/19  Yes [provider]  metoprolol succinate (TOPROL-XL) 50 MG 24 hr tablet Take 50 mg by mouth 2 (two) times daily. Take with or immediately following a meal.    Yes [provider]  Polyethyl Glycol-Propyl Glycol (SYSTANE) 0.4-0.3 % GEL ophthalmic gel Place 1 application into both eyes daily as needed (Dry eyes).    Yes [provider]  pramipexole (MIRAPEX) 1 MG tablet Take 1 mg by mouth at bedtime.   Yes [provider]  simvastatin (ZOCOR) 20 MG tablet Take 20 mg by mouth every evening.   Yes [provider]  timolol (BETIMOL) 0.25 % ophthalmic solution Place 1 drop into both eyes 2 (two) times daily.   Yes [provider]  triamcinolone ointment (KENALOG) 0.1 % Apply 1 application topically 2 (two) times daily as needed (skin irritation.).   Yes [provider]  vitamin C (ASCORBIC ACID) 500 MG tablet  Take 500 mg by mouth 2 (two) times daily.   Yes [provider]  HYDROcodone-acetaminophen (NORCO/VICODIN) 5-325 MG tablet Take 1-2 tablets by mouth every 6 (six) hours as needed for severe pain. Patient not taking: Reported on 08/23/2019 03/02/18   Edmisten, Drue Dun L, PA  methocarbamol (ROBAXIN) 500 MG tablet Take 1 tablet (500 mg total) by mouth every 6 (six) hours as needed for muscle spasms. Patient not taking: Reported on 08/23/2019 03/02/18   Edmisten, Ok Anis, PA  traMADol (ULTRAM) 50 MG tablet Take 1-2 tablets (50-100 mg total) by mouth every 6 (six) hours as needed for moderate pain. Patient not taking: Reported on 08/23/2019 03/02/18   Derl Barrow, PA    Physical Exam: BP 110/83    Pulse (!) 133    Temp 98.8 F (37.1 C) (Oral)    Resp (!) 21    SpO2 93%    General: 86 y.o. year-old female well developed well nourished in no acute  distress.  Alert and oriented x3.  HEENT: Normocephalic, atraumatic, EOMI  Neck: Supple, trachea medial  Cardiovascular: Regular rate and rhythm with no rubs or gallops.  No thyromegaly or JVD noted.  No lower extremity edema. 2/4 pulses in all 4 extremities.  Respiratory: Clear to auscultation with no wheezes or rales. Good inspiratory effort.  Abdomen: Soft nontender nondistended with normal bowel sounds x4 quadrants.  Muskuloskeletal: No cyanosis, clubbing or edema noted bilaterally  Neuro: CN II-XII intact, strength, sensation, reflexes  Skin: No ulcerative lesions noted or rashes  Psychiatry: Judgement and insight appear normal. Mood is appropriate for condition and setting          Labs on Admission:  Basic Metabolic Panel: Recent Labs  Lab 08/23/19 2240  NA 136  K 3.6  CL 102  CO2 23  GLUCOSE 108*  BUN 15  CREATININE 0.83  CALCIUM 9.0   Liver Function Tests: Recent Labs  Lab 08/23/19 2240  AST 25  ALT 20  ALKPHOS 67  BILITOT 1.2  PROT 7.0  ALBUMIN 3.2*   Recent Labs  Lab 08/23/19 2240  LIPASE 20   No results for input(s): AMMONIA in the last 168 hours. CBC: Recent Labs  Lab 08/23/19 2240  WBC 13.3*  NEUTROABS 10.6*  HGB 12.7  HCT 39.6  MCV 94.7  PLT 220   Cardiac Enzymes: No results for input(s): CKTOTAL, CKMB, CKMBINDEX, TROPONINI in the last 168 hours.  BNP (last 3 results) No results for input(s): BNP in the last 8760 hours.  ProBNP (last 3 results) No results for input(s): PROBNP in the last 8760 hours.  CBG: No results for input(s): GLUCAP in the last 168 hours.  Radiological Exams on Admission: CT ABDOMEN PELVIS W CONTRAST  Result Date: 08/24/2019 CLINICAL DATA:  Right lower quadrant pain radiating to lower back for 3 weeks, associated nausea EXAM: CT ABDOMEN AND PELVIS WITH CONTRAST TECHNIQUE: Multidetector CT imaging of the abdomen and pelvis was performed using the standard protocol following bolus administration of  intravenous contrast. CONTRAST:  150mL OMNIPAQUE IOHEXOL 300 MG/ML  SOLN COMPARISON:  Renal ultrasound 11/30/2014, abdominal ultrasound 04/16/1998 (report only), CT abdomen 04/13/1998 (report only) FINDINGS: Lower chest: Consolidative opacity seen in the superior segment left lower lobe only partially imaged on this exam. Trace bilateral pleural effusions with adjacent passive atelectasis. Additional bandlike areas of subsegmental atelectasis or scarring bilaterally. Mild cardiomegaly with predominantly biatrial enlargement. Dense mitral annular calcifications are noted. Additional calcifications present on the aortic leaflets. Three-vessel coronary artery disease  is noted. Distal thoracic aortic atherosclerosis as well. Hepatobiliary: Multiple hypoattenuating subcentimeter foci throughout the liver too small to fully characterize on CT imaging but statistically likely benign. A larger fluid attenuation probable hepatic cyst seen in the right lobe measuring up to 3.6 cm in size. No concerning focal liver abnormality. Smooth surface contour. Normal liver attenuation. Gallbladder contains a partially calcified gallstone towards the fundus. No pericholecystic fluid or inflammation. No biliary ductal dilatation or calcified intraductal gallstones are seen. Pancreas: Cystic focus seen at the pancreatic head measuring 11 mm in size (3/32). Additional cystic lesion in the pancreatic tail measuring up to 12 mm in size (3/22) partial fatty replacement of the pancreas. No ductal dilatation. No peripancreatic inflammation. Spleen: Prior splenectomy with some residual splenic tissue along the operative bed and in the left upper quadrant. Adrenals/Urinary Tract: Normal adrenal glands. No visible or concerning renal lesions. Bilateral extrarenal pelves with at most mild hydronephrosis but with normal caliber change by the level of the ureteropelvic junctions. No visible obstructing calculus other urolithiasis. Urinary bladder is  partially decompressed no gross bladder abnormality. Stomach/Bowel: Distal esophagus, stomach and duodenal sweep are unremarkable. No small bowel wall thickening or dilatation. No evidence of obstruction. A normal appendix is visualized. No colonic dilatation or wall thickening. Scattered colonic diverticula without focal inflammation to suggest diverticulitis. Vascular/Lymphatic: Atherosclerotic calcifications throughout the abdominal aorta and branch vessels. No aneurysm or ectasia. No enlarged abdominopelvic lymph nodes. Reproductive: Uterus is surgically absent. No concerning adnexal lesions. Question appearance of the left ovary. Right ovary is poorly visualized, possibly obscured by streak artifact. Other: No abdominopelvic free fluid or free gas. No bowel containing hernias. Small fat containing umbilical hernia and right para umbilical hernia. Musculoskeletal: Exaggerated lumbar lordosis. Anterolisthesis of L4 on 5 without associated spondylolysis. Multilevel degenerative changes are present in the imaged portions of the spine. Interspinous arthrosis compatible with Baastrup's disease. Additional degenerative changes in the pelvis including severe arthrosis of the left hip and prior right hip arthroplasty with heterotopic ossification. No acute or worrisome osseous lesions. IMPRESSION: 1. Consolidative opacity in the superior segment left lower lobe only partially imaged on this exam. Could reflect atelectasis or pneumonia. Correlate with clinical findings. 2. Trace bilateral pleural effusions with adjacent passive atelectasis. 3. No acute abdominopelvic abnormality to provide cause for patient's symptoms. 4. Bilateral extrarenal pelves with at most mild hydronephrosis but with normal caliber change by the level of the ureteropelvic junctions. Findings could reflect chronic UPJ obstruction particularly given the absence of visible urolithiasis or other acute urinary tract abnormality. 5. Cholelithiasis  without evidence of acute cholecystitis. 6. Cystic lesions in the pancreatic head and tail measuring up to 11 mm in size. These could reflect pseudocysts or cystic pancreatic neoplasms. In the absence of acute biliary symptoms, consider reimaging with dedicated contrast enhanced MRI or pancreatic protocol CT at 2 years. This recommendation follows ACR consensus guidelines: Management of Incidental Pancreatic Cysts: A White Paper of the ACR Incidental Findings Committee. J Am Coll Radiol 1884;16:606-301. 7. Colonic diverticulosis without evidence of diverticulitis. 8. Severe left hip arthrosis. 9. Aortic Atherosclerosis (ICD10-I70.0). Electronically Signed   By: Lovena Le M.D.   On: 08/24/2019 01:06   DG Chest Port 1 View  Result Date: 08/23/2019 CLINICAL DATA:  Weakness EXAM: PORTABLE CHEST 1 VIEW COMPARISON:  12/01/2014 FINDINGS: Linear scarring or atelectasis in the right mid lung. Mild cardiomegaly with central vascular congestion. Linear atelectasis or scar in the left base. No consolidation or effusion. Aortic atherosclerosis. No pneumothorax.  IMPRESSION: Cardiomegaly with mild central vascular congestion. Electronically Signed   By: Donavan Foil M.D.   On: 08/23/2019 23:01    EKG: I independently viewed the EKG done and my findings are as followed: A. fib with RVR  Assessment/Plan Present on Admission:  Back pain  Restless leg syndrome  Essential hypertension, benign  GERD (gastroesophageal reflux disease)  Principal Problem:   Back pain Active Problems:   Restless leg syndrome   Essential hypertension, benign   GERD (gastroesophageal reflux disease)   Generalized weakness   Acute on chronic back pain Generalized weakness Patient appears to have history of chronic back pain, med rec showed that she used to take Norco, Robaxin and tramadol She complains of low back pain around the waist area with radiation to the right knee Continue Tylenol as needed Continue Robaxin 500 mg  every 6 hours as needed PT/OT eval and treat For fall precaution and neurochecks  A-Fib with RVR(on Eliquis) Continue home Eliquis when med rec is updated Continue home meds when med rec is updated  Essential hypertension Continue home meds when med is updated  Hypothyroidism Continue Synthroid when med rec is updated  Hyperlipidemia Continue simvastatin when med rec is updated  GERD Continue Pepcid when med rec sedated  DVT prophylaxis: Eliquis  Code Status: Full code  Family Communication: Daughter at bedside (all questions answered to satisfaction)  Disposition Plan:  Patient is from:                        home Anticipated DC to:                   SNF or family members home Anticipated DC date:               2-3 days Anticipated DC barriers:           Awaiting recovery from illness   Consults called: None  Admission status: Inpatient admission due to severity of patient's symptoms with difficulty in ambulation at this time.   Bernadette Hoit MD [facility]  If 7PM-7AM, please contact night-coverage [email or web address]  08/24/2019, 5:11 AM

## 2019-08-24 NOTE — ED Provider Notes (Signed)
[facility] EMERGENCY DEPARTMENT Provider Note   CSN: [id number] Arrival date & time: 08/23/19  2155     History Chief Complaint  Patient presents with  . Weakness  . Flank Pain    Dana Bradford is a 86 y.o. female.  Patient presents to the emergency department with a chief complaint of generalized weakness, back pain, and abdominal pain. She reports that she has been feeling weak for the past several days, and also reports having quite severe epigastric, abdominal, and back pain. She states that the pain limits her mobility and her ability to care for self. She also feels very weak and rundown. She denies any fevers. She states that she has had some nausea, but no vomiting. She states that she did not take her dose of Eliquis or metoprolol tonight. She states that she is constantly in A. fib and is scheduled to see the cardiologist in the near future. She also reports that she has venous stasis ulcers on her left lower extremity. She has had cellulitis in the past. Past surgical history notable for splenectomy.  The history is provided by the patient. No language interpreter was used.       Past Medical History:  Diagnosis Date  . Acute kidney failure ([place]) 2016 to 2017 due to vacomycin   no kidney issues now  . Arthritis   . Food allergy    Fish (Hives)   . Glaucoma   . Hypertension   . Impaired fasting glucose   . Other specified acquired hypothyroidism   . Restless leg   . Restless leg   . Stroke ([place]) 05/2017   ocular stoke on plavix , right eye some vision loss    Patient Active Problem List   Diagnosis Date Noted  . OA (osteoarthritis) of hip 02/28/2018  . GERD (gastroesophageal reflux disease) 11/13/2012  . Essential hypertension, benign 06/17/2012  . Impaired fasting glucose 06/17/2012  . Other specified acquired hypothyroidism 06/05/2012  . Restless leg syndrome 06/05/2012    Past Surgical History:  Procedure Laterality Date  . ABDOMINAL  HYSTERECTOMY  1993   Uterine Prolapse partial  . MASTECTOMY  1974   right breast not sure if cancer 1960's  . SMALL TOE RIGHT FOOT REMOVED  04/2017   DUE TO INFECTION  . Spleenectomy  2000  . TOTAL HIP ARTHROPLASTY Right 02/28/2018   Procedure: RIGHT TOTAL HIP ARTHROPLASTY ANTERIOR APPROACH;  Surgeon: Gaynelle Arabian, MD;  Location: [facility];  Service: Orthopedics;  Laterality: Right;  158min     OB History   No obstetric history on file.     Family History  Problem Relation Age of Onset  . Cancer Mother   . Heart disease Father   . Hyperlipidemia Father     Social History   Tobacco Use  . Smoking status: Former Smoker    Packs/day: 1.00    Years: 30.00    Pack years: 30.00    Types: Cigarettes  . Smokeless tobacco: Never Used  . Tobacco comment: qiot 50 yrs ago  Vaping Use  . Vaping Use: Never used  Substance Use Topics  . Alcohol use: Not Currently  . Drug use: No    Home Medications Prior to Admission medications   Medication Sig Start Date End Date Taking? Authorizing Provider  acetaminophen (TYLENOL) 500 MG tablet Take 1,000 mg by mouth 2 (two) times daily as needed for mild pain or headache.    Yes [provider]  brimonidine (ALPHAGAN) 0.2 %  ophthalmic solution Place 1 drop into both eyes 2 (two) times daily.   Yes [provider]  Cholecalciferol (VITAMIN D3) 50 MCG (2000 UT) TABS Take 2,000 Units by mouth daily.   Yes [provider]  diltiazem (TIAZAC) 180 MG 24 hr capsule Take 180 mg by mouth daily. 07/26/19  Yes [provider]  ELIQUIS 5 MG TABS tablet Take 5 mg by mouth 2 (two) times daily. 08/02/19  Yes [provider]  famotidine (PEPCID) 40 MG tablet Take 40 mg by mouth daily. 08/20/19  Yes [provider]  ferrous sulfate 325 (65 FE) MG tablet Take 325 mg by mouth 2 (two) times daily.   Yes [provider]  furosemide (LASIX) 20 MG tablet Take 20 mg by mouth daily.   Yes [provider]   irbesartan-hydrochlorothiazide (AVALIDE) 300-12.5 MG tablet Take 1 tablet by mouth daily.   Yes [provider]  latanoprost (XALATAN) 0.005 % ophthalmic solution Place 1 drop into both eyes at bedtime.   Yes [provider]  levothyroxine (SYNTHROID, LEVOTHROID) 88 MCG tablet Take 88 mcg by mouth daily before breakfast.   Yes [provider]  metFORMIN (GLUCOPHAGE) 500 MG tablet Take 500 mg by mouth at bedtime. 06/11/19  Yes [provider]  metolazone (ZAROXOLYN) 2.5 MG tablet Take 2.5 mg by mouth every Monday. 07/26/19  Yes [provider]  metoprolol succinate (TOPROL-XL) 50 MG 24 hr tablet Take 50 mg by mouth 2 (two) times daily. Take with or immediately following a meal.    Yes [provider]  Polyethyl Glycol-Propyl Glycol (SYSTANE) 0.4-0.3 % GEL ophthalmic gel Place 1 application into both eyes daily as needed (Dry eyes).    Yes [provider]  pramipexole (MIRAPEX) 1 MG tablet Take 1 mg by mouth at bedtime.   Yes [provider]  simvastatin (ZOCOR) 20 MG tablet Take 20 mg by mouth every evening.   Yes [provider]  timolol (BETIMOL) 0.25 % ophthalmic solution Place 1 drop into both eyes 2 (two) times daily.   Yes [provider]  triamcinolone ointment (KENALOG) 0.1 % Apply 1 application topically 2 (two) times daily as needed (skin irritation.).   Yes [provider]  vitamin C (ASCORBIC ACID) 500 MG tablet Take 500 mg by mouth 2 (two) times daily.   Yes [provider]  HYDROcodone-acetaminophen (NORCO/VICODIN) 5-325 MG tablet Take 1-2 tablets by mouth every 6 (six) hours as needed for severe pain. Patient not taking: Reported on 08/23/2019 03/02/18   Edmisten, Drue Dun L, PA  methocarbamol (ROBAXIN) 500 MG tablet Take 1 tablet (500 mg total) by mouth every 6 (six) hours as needed for muscle spasms. Patient not taking: Reported on 08/23/2019 03/02/18   Edmisten, Ok Anis, PA  traMADol  (ULTRAM) 50 MG tablet Take 1-2 tablets (50-100 mg total) by mouth every 6 (six) hours as needed for moderate pain. Patient not taking: Reported on 08/23/2019 03/02/18   Theresa Duty L, PA    Allergies    Doxycycline, Amlodipine, Arthrotec [diclofenac-misoprostol], Celebrex [celecoxib], Shellfish allergy, Vancomycin, Diclofenac, and Latex  Review of Systems   Review of Systems  All other systems reviewed and are negative.   Physical Exam Updated Vital Signs BP 122/89   Pulse 80   Temp 98.8 F (37.1 C) (Oral)   Resp (!) 21   SpO2 90%   Physical Exam Vitals and nursing note reviewed.  Constitutional:      General: She is not in acute  distress.    Appearance: She is well-developed.  HENT:     Head: Normocephalic and atraumatic.  Eyes:     Conjunctiva/sclera: Conjunctivae normal.  Cardiovascular:     Rate and Rhythm: Tachycardia present. Rhythm irregular.     Heart sounds: No murmur heard.   Pulmonary:     Effort: Pulmonary effort is normal. No respiratory distress.     Breath sounds: Normal breath sounds.  Abdominal:     Palpations: Abdomen is soft.     Tenderness: There is no abdominal tenderness.     Comments: Mild epigastric abdominal discomfort  Musculoskeletal:        General: Normal range of motion.     Cervical back: Neck supple.     Comments: Mild erythema to bilateral lower extremities  Skin:    General: Skin is warm and dry.  Neurological:     Mental Status: She is alert and oriented to person, place, and time.  Psychiatric:        Mood and Affect: Mood normal.        Behavior: Behavior normal.           ED Results / Procedures / Treatments   Labs (all labs ordered are listed, but only abnormal results are displayed) Labs Reviewed  CBC WITH DIFFERENTIAL/PLATELET - Abnormal; Notable for the following components:      Result Value   WBC 13.3 (*)    Neutro Abs 10.6 (*)    Monocytes Absolute 1.2 (*)    All other components within normal limits   COMPREHENSIVE METABOLIC PANEL - Abnormal; Notable for the following components:   Glucose, Bld 108 (*)    Albumin 3.2 (*)    All other components within normal limits  LIPASE, BLOOD  LACTIC ACID, PLASMA  URINALYSIS, ROUTINE W REFLEX MICROSCOPIC  TROPONIN I (HIGH SENSITIVITY)  TROPONIN I (HIGH SENSITIVITY)    EKG EKG Interpretation  Date/Time:  Friday August 23 2019 22:06:08 EDT Ventricular Rate:  119 PR Interval:    QRS Duration: 90 QT Interval:  332 QTC Calculation: 468 R Axis:   99 Text Interpretation: Atrial fibrillation Right axis deviation Borderline repolarization abnormality No SETMI Confirmed by Octaviano Glow [phone]) on 08/23/2019 11:06:12 PM   Radiology CT ABDOMEN PELVIS W CONTRAST  Result Date: 08/24/2019 CLINICAL DATA:  Right lower quadrant pain radiating to lower back for 3 weeks, associated nausea EXAM: CT ABDOMEN AND PELVIS WITH CONTRAST TECHNIQUE: Multidetector CT imaging of the abdomen and pelvis was performed using the standard protocol following bolus administration of intravenous contrast. CONTRAST:  11mL OMNIPAQUE IOHEXOL 300 MG/ML  SOLN COMPARISON:  Renal ultrasound 11/30/2014, abdominal ultrasound 04/16/1998 (report only), CT abdomen 04/13/1998 (report only) FINDINGS: Lower chest: Consolidative opacity seen in the superior segment left lower lobe only partially imaged on this exam. Trace bilateral pleural effusions with adjacent passive atelectasis. Additional bandlike areas of subsegmental atelectasis or scarring bilaterally. Mild cardiomegaly with predominantly biatrial enlargement. Dense mitral annular calcifications are noted. Additional calcifications present on the aortic leaflets. Three-vessel coronary artery disease is noted. Distal thoracic aortic atherosclerosis as well. Hepatobiliary: Multiple hypoattenuating subcentimeter foci throughout the liver too small to fully characterize on CT imaging but statistically likely benign. A larger fluid attenuation  probable hepatic cyst seen in the right lobe measuring up to 3.6 cm in size. No concerning focal liver abnormality. Smooth surface contour. Normal liver attenuation. Gallbladder contains a partially calcified gallstone towards the fundus. No pericholecystic fluid or inflammation. No biliary ductal dilatation or calcified  intraductal gallstones are seen. Pancreas: Cystic focus seen at the pancreatic head measuring 11 mm in size (3/32). Additional cystic lesion in the pancreatic tail measuring up to 12 mm in size (3/22) partial fatty replacement of the pancreas. No ductal dilatation. No peripancreatic inflammation. Spleen: Prior splenectomy with some residual splenic tissue along the operative bed and in the left upper quadrant. Adrenals/Urinary Tract: Normal adrenal glands. No visible or concerning renal lesions. Bilateral extrarenal pelves with at most mild hydronephrosis but with normal caliber change by the level of the ureteropelvic junctions. No visible obstructing calculus other urolithiasis. Urinary bladder is partially decompressed no gross bladder abnormality. Stomach/Bowel: Distal esophagus, stomach and duodenal sweep are unremarkable. No small bowel wall thickening or dilatation. No evidence of obstruction. A normal appendix is visualized. No colonic dilatation or wall thickening. Scattered colonic diverticula without focal inflammation to suggest diverticulitis. Vascular/Lymphatic: Atherosclerotic calcifications throughout the abdominal aorta and branch vessels. No aneurysm or ectasia. No enlarged abdominopelvic lymph nodes. Reproductive: Uterus is surgically absent. No concerning adnexal lesions. Question appearance of the left ovary. Right ovary is poorly visualized, possibly obscured by streak artifact. Other: No abdominopelvic free fluid or free gas. No bowel containing hernias. Small fat containing umbilical hernia and right para umbilical hernia. Musculoskeletal: Exaggerated lumbar lordosis.  Anterolisthesis of L4 on 5 without associated spondylolysis. Multilevel degenerative changes are present in the imaged portions of the spine. Interspinous arthrosis compatible with Baastrup's disease. Additional degenerative changes in the pelvis including severe arthrosis of the left hip and prior right hip arthroplasty with heterotopic ossification. No acute or worrisome osseous lesions. IMPRESSION: 1. Consolidative opacity in the superior segment left lower lobe only partially imaged on this exam. Could reflect atelectasis or pneumonia. Correlate with clinical findings. 2. Trace bilateral pleural effusions with adjacent passive atelectasis. 3. No acute abdominopelvic abnormality to provide cause for patient's symptoms. 4. Bilateral extrarenal pelves with at most mild hydronephrosis but with normal caliber change by the level of the ureteropelvic junctions. Findings could reflect chronic UPJ obstruction particularly given the absence of visible urolithiasis or other acute urinary tract abnormality. 5. Cholelithiasis without evidence of acute cholecystitis. 6. Cystic lesions in the pancreatic head and tail measuring up to 11 mm in size. These could reflect pseudocysts or cystic pancreatic neoplasms. In the absence of acute biliary symptoms, consider reimaging with dedicated contrast enhanced MRI or pancreatic protocol CT at 2 years. This recommendation follows ACR consensus guidelines: Management of Incidental Pancreatic Cysts: A White Paper of the ACR Incidental Findings Committee. J Am Coll Radiol 5056;97:948-016. 7. Colonic diverticulosis without evidence of diverticulitis. 8. Severe left hip arthrosis. 9. Aortic Atherosclerosis (ICD10-I70.0). Electronically Signed   By: Lovena Le M.D.   On: 08/24/2019 01:06   DG Chest Port 1 View  Result Date: 08/23/2019 CLINICAL DATA:  Weakness EXAM: PORTABLE CHEST 1 VIEW COMPARISON:  12/01/2014 FINDINGS: Linear scarring or atelectasis in the right mid lung. Mild  cardiomegaly with central vascular congestion. Linear atelectasis or scar in the left base. No consolidation or effusion. Aortic atherosclerosis. No pneumothorax. IMPRESSION: Cardiomegaly with mild central vascular congestion. Electronically Signed   By: Donavan Foil M.D.   On: 08/23/2019 23:01    Procedures Procedures (including critical care time)  Medications Ordered in ED Medications  metoprolol tartrate (LOPRESSOR) injection 5 mg (5 mg Intravenous Given 08/24/19 0053)  apixaban (ELIQUIS) tablet 5 mg (5 mg Oral Given 08/24/19 0044)  iohexol (OMNIPAQUE) 300 MG/ML solution 100 mL (100 mLs Intravenous Contrast Given 08/24/19 0026)  ED Course  I have reviewed the triage vital signs and the nursing notes.  Pertinent labs & imaging results that were available during my care of the patient were reviewed by me and considered in my medical decision making (see chart for details).  Clinical Course as of Aug 23 220  Sat Aug 24, 2019  0055 86 yo female presenting with constellation of symptoms, including back pain, abdominal pain, leg swelling and pain, nausea, diminished appetite, intermittent constipation and diarrhea, and fatigue.  On exam she appears comfortable.  Abdominal is soft, nondistended, no significant tenderness.  She has erythema with some redness of the lower extremities, and reports a history of cellulitis treated in the hospital, but this looks like it could be consistent with stasis dermatitis, which she states her dermatologist also believes.  She is in A Fib on arrival with HR fluctuating in 100-140 bpm range.  Hx of afib since February and compliant with eliquis.  Did not take metoprolol or eliquis today, so we'll try IV metoprolol for rate control, while awaiting her lab workup.  Broad workup initiated including troponin, ecg (nonsichemic A fib) and CT abdomen pelvis.  If there are acute findings on her workup she may need hospital admission.   [MT]    Clinical Course User  Index [MT] Trifan, Carola Rhine, MD   MDM Rules/Calculators/A&P                          This patient complains of generalized weakness and abdominal pain, this involves an extensive number of treatment options, and is a complaint that carries with it a high risk of complications and morbidity.  The differential diagnosis includes ACS, infection, afib, dehydration.  Pertinent Labs I ordered, reviewed, and interpreted labs, which included troponin which is 9, lactic acid is 1.4, CBC notable for leukocytosis to 13.3. Electrolytes are normal. Lipase is 20. Normal LFTs.  Imaging Interpretation I ordered imaging studies which included CT abdomen pelvis which showed consolidative opacity in the superior segment of the left lower lobe. Patient has had some cough, question CAP. CT also notable for trace bilateral pleural effusions. Additional findings notable for mild hydronephrosis consistent with chronic UPJ obstruction, but no visible ureteral lithiasis or acute urinary tract abnormality. She has cholelithiasis but no evidence of cholecystitis. There is also a cystic lesion in the pancreatic head, which could be pseudocysts or neoplasm. This is in the location that she is having pain.   Medications I ordered medication metoprolol and Eliquis for rate control and A. fib.  Sources Additional history obtained from daughter, who was concerned about patient's ability to care for herself given her weakened condition.   Consultants Dr. Josephine Cables, from [facility], who is appreciated for admitting the patient to the hospital. I believe patient requires admission given her rapid heart rate in the setting of A. fib along with questionable pneumonia seen on CT and pancreatic pseudocyst versus neoplasm located where the patient is having the majority of her pain which is causing her to be less mobile than normal.  Critical Interventions  None  Reassessments After the interventions stated above, I reevaluated the  patient and found agreeable with plan for admission. Heart rate is responding to metoprolol.   Final Clinical Impression(s) / ED Diagnoses Final diagnoses:  Epigastric pain  Pancreatic mass  Atrial fibrillation, unspecified type ([place])  Cough    Rx / DC Orders ED Discharge Orders    None  Montine Circle, PA-C 08/24/19 0245    Wyvonnia Dusky, MD 08/24/19 [phone]

## 2019-08-24 NOTE — ED Notes (Signed)
Pt transported to CT ?

## 2019-08-24 NOTE — Progress Notes (Signed)
Patient is running Afib with a heart rate in the 140's. Patient has a yellow MEWS. Florene Glen, MD notifed. New orders placed and followed. Shela Commons, RN notified of this information. Will continue to monitor.

## 2019-08-24 NOTE — ED Notes (Signed)
Attempted report 

## 2019-08-24 NOTE — Progress Notes (Addendum)
PROGRESS NOTE    Dana Bradford  CHE:[id number] DOB: 12-19-1930 DOA: 08/23/2019 PCP: Dana Resides, MD   Chief Complaint  Patient presents with  . Weakness  . Flank Pain    Brief Narrative:   Dana Bradford is Dana Bradford 86 y.o. female with medical history significant for Afib (on Eliquis), venous stasis dermatitis, HLD, Hypothyroidism, hypertension, RLS, s/p bilateral knee replacement who presents to the emergency department with several complaints, she complains of Dana Bradford 4 to 5-day onset of generalized weakness, back pain and abdominal pain.  Patient states that the back pain worsens with ambulation and this has resulted in limiting mobility as well as ability to care for self.  Abdominal pain was epigastric and right upper quadrant, this was associated with nausea without vomiting.  Patient also complained of difficulty in being able to control Dana Bradford. fib since diagnosis in February of this year, though she states that she did not take her medication (Eliquis or metoprolol) last night.  She states that she has been following with Dana Bradford cardiologist.  She also complained of intermittent constipation/diarrhea but she denies any diagnosis of IBS.  Patient denies fever, chills, chest pain, shortness of breath  ED Course:  In the emergency department, she was tachycardic, and intermittently tachypneic.  Work-up in the ED showed leukocytosis, Albumin 3.2.  CT abdomen and pelvis showed no acute abdominal pelvic abnormality, but showed cystic lesions in the pancreatic head and tail measuring up to 11 mm in size which could reflect pseudocysts or cystic pancreatic neoplasms.  She was treated with Eliquis and Metoprolol.  Hospitalist was asked to admit patient for further evaluation and management.  Assessment & Plan:   Principal Problem:   Back pain Active Problems:   Restless leg syndrome   Essential hypertension, benign   GERD (gastroesophageal reflux disease)   Generalized weakness  Acute on chronic back  pain Generalized weakness CT abdomen/pelvis without clear cause for her sx - ? Consolidative opacity in superior segment LLL, mild hydro (?chronic UPJ obstruction), cystic lesions in pancreatic head and tail, and severe L hip arthrosis No numbness, weakness, bowel or bladder sx  She's no longer taking norco/robaxin/tramadol, notes these were old Will start pain control with scheduled APAP, oxycodone prn, voltaren gel - suspect pain is musculoskeletal given worsening with movement Consider additional imaging with MRI lumbar spine if she's not improving with above treatment Follow UA for mild hydro, will also discuss with urology to ensure no additional w/u needed here PT/OT, OOB  Dana Bradford with RVR(on Eliquis) Continue metoprolol and eliquis - RVR this AM (improved after metop) Diltiazem on med list, but she doesn't recognize this med (will discuss with pharmacy/med rec)  Essential hypertension Continue metoprolol.  She's not sure if she takes avalide or diltiazem.  Appreciate pharmacy/med rec assistance.    Hypothyroidism Continue Synthroid   Hyperlipidemia Continue simvastatin   GERD Continue Pepcid   Hypokalemia  Hypomagnesemia: replace and follow  DVT prophylaxis: eliquis Code Status: full  Family Communication: none at bedside - called, daughter Disposition:   Status is: Inpatient  Remains inpatient appropriate because:Inpatient level of care appropriate due to severity of illness   Dispo: The patient is from: Home              Anticipated d/c is to: pending              Anticipated d/c date is: 1 day              Patient currently is not  medically stable to d/c.   Consultants:   none  Procedures:   none  Antimicrobials:  Anti-infectives (From admission, onward)   None      Subjective: Started having pain in her R side Dana Bradford few weeks ago Progressively started raditating across back She c/o nausea/vomiting Generalized weakness   Objective: Vitals:    08/24/19 0230 08/24/19 0842 08/24/19 1147 08/24/19 1244  BP: 110/83 (!) 112/94 101/73 109/67  Pulse: (!) 133 (!) 105 95 96  Resp: (!) 21  20 20   Temp:  98.7 F (37.1 C) 99 F (37.2 C) 99.4 F (37.4 C)  TempSrc:   Oral Oral  SpO2: 93% 94% 92% 91%  Weight:  67.1 kg    Height:  4\' 10"  (1.473 m)      Intake/Output Summary (Last 24 hours) at 08/24/2019 1627 Last data filed at 08/24/2019 1521 Gross per 24 hour  Intake 322 ml  Output 801 ml  Net -479 ml   Filed Weights   08/24/19 0842  Weight: 67.1 kg    Examination:  General exam: Appears calm and comfortable  Respiratory system: Clear to auscultation. Respiratory effort normal. Cardiovascular system: irregularly irregular, tachy Gastrointestinal system: Abdomen is nondistended, soft and nontender. Central nervous system: Alert and oriented. No focal neurological deficits. Extremities: negative SLR on R MSK: TTP midline to lower back  Skin: No rashes, lesions or ulcers Psychiatry: Judgement and insight appear normal. Mood & affect appropriate.     Data Reviewed: I have personally reviewed following labs and imaging studies  CBC: Recent Labs  Lab 08/23/19 2240 08/24/19 0512  WBC 13.3* 13.0*  NEUTROABS 10.6*  --   HGB 12.7 11.5*  HCT 39.6 35.0*  MCV 94.7 93.3  PLT 220 761    Basic Metabolic Panel: Recent Labs  Lab 08/23/19 2240 08/24/19 0512  NA 136 135  K 3.6 3.1*  CL 102 102  CO2 23 22  GLUCOSE 108* 94  BUN 15 13  CREATININE 0.83 0.79  CALCIUM 9.0 8.7*  MG  --  1.3*  PHOS  --  3.0    GFR: Estimated Creatinine Clearance: 41 mL/min (by C-G formula based on SCr of 0.79 mg/dL).  Liver Function Tests: Recent Labs  Lab 08/23/19 2240 08/24/19 0512  AST 25 20  ALT 20 18  ALKPHOS 67 59  BILITOT 1.2 1.3*  PROT 7.0 6.2*  ALBUMIN 3.2* 2.9*    CBG: No results for input(s): GLUCAP in the last 168 hours.   Recent Results (from the past 240 hour(s))  SARS Coronavirus 2 by RT PCR (hospital order,  performed in [facility] hospital lab) Nasopharyngeal Nasopharyngeal Swab     Status: None   Collection Time: 08/24/19  5:48 AM   Specimen: Nasopharyngeal Swab  Result Value Ref Range Status   SARS Coronavirus 2 NEGATIVE NEGATIVE Final    Comment: (NOTE) SARS-CoV-2 target nucleic acids are NOT DETECTED.  The SARS-CoV-2 RNA is generally detectable in upper and lower respiratory specimens during the acute phase of infection. The lowest concentration of SARS-CoV-2 viral copies this assay can detect is 250 copies / mL. Mushka Laconte negative result does not preclude SARS-CoV-2 infection and should not be used as the sole basis for treatment or other patient management decisions.  Asyah Candler negative result may occur with improper specimen collection / handling, submission of specimen other than nasopharyngeal swab, presence of viral mutation(s) within the areas targeted by this assay, and inadequate number of viral copies (<250 copies / mL). Orlena Garmon negative result  must be combined with clinical observations, patient history, and epidemiological information.  Fact Sheet for Patients:   StrictlyIdeas.no  Fact Sheet for Healthcare Providers: BankingDealers.co.za  This test is not yet approved or  cleared by the [place] FDA and has been authorized for detection and/or diagnosis of SARS-CoV-2 by FDA under an Emergency Use Authorization (EUA).  This EUA will remain in effect (meaning this test can be used) for the duration of the COVID-19 declaration under Section 564(b)(1) of the Act, 21 U.S.C. section 360bbb-3(b)(1), unless the authorization is terminated or revoked sooner.  Performed at [facility], [place] [address], [place],  [address]          Radiology Studies: CT ABDOMEN PELVIS W CONTRAST  Result Date: 08/24/2019 CLINICAL DATA:  Right lower quadrant pain radiating to lower back for 3 weeks, associated nausea EXAM: CT ABDOMEN AND PELVIS  WITH CONTRAST TECHNIQUE: Multidetector CT imaging of the abdomen and pelvis was performed using the standard protocol following bolus administration of intravenous contrast. CONTRAST:  131mL OMNIPAQUE IOHEXOL 300 MG/ML  SOLN COMPARISON:  Renal ultrasound 11/30/2014, abdominal ultrasound 04/16/1998 (report only), CT abdomen 04/13/1998 (report only) FINDINGS: Lower chest: Consolidative opacity seen in the superior segment left lower lobe only partially imaged on this exam. Trace bilateral pleural effusions with adjacent passive atelectasis. Additional bandlike areas of subsegmental atelectasis or scarring bilaterally. Mild cardiomegaly with predominantly biatrial enlargement. Dense mitral annular calcifications are noted. Additional calcifications present on the aortic leaflets. Three-vessel coronary artery disease is noted. Distal thoracic aortic atherosclerosis as well. Hepatobiliary: Multiple hypoattenuating subcentimeter foci throughout the liver too small to fully characterize on CT imaging but statistically likely benign. Doniesha Landau larger fluid attenuation probable hepatic cyst seen in the right lobe measuring up to 3.6 cm in size. No concerning focal liver abnormality. Smooth surface contour. Normal liver attenuation. Gallbladder contains Zamire Whitehurst partially calcified gallstone towards the fundus. No pericholecystic fluid or inflammation. No biliary ductal dilatation or calcified intraductal gallstones are seen. Pancreas: Cystic focus seen at the pancreatic head measuring 11 mm in size (3/32). Additional cystic lesion in the pancreatic tail measuring up to 12 mm in size (3/22) partial fatty replacement of the pancreas. No ductal dilatation. No peripancreatic inflammation. Spleen: Prior splenectomy with some residual splenic tissue along the operative bed and in the left upper quadrant. Adrenals/Urinary Tract: Normal adrenal glands. No visible or concerning renal lesions. Bilateral extrarenal pelves with at most mild  hydronephrosis but with normal caliber change by the level of the ureteropelvic junctions. No visible obstructing calculus other urolithiasis. Urinary bladder is partially decompressed no gross bladder abnormality. Stomach/Bowel: Distal esophagus, stomach and duodenal sweep are unremarkable. No small bowel wall thickening or dilatation. No evidence of obstruction. Treavor Blomquist normal appendix is visualized. No colonic dilatation or wall thickening. Scattered colonic diverticula without focal inflammation to suggest diverticulitis. Vascular/Lymphatic: Atherosclerotic calcifications throughout the abdominal aorta and branch vessels. No aneurysm or ectasia. No enlarged abdominopelvic lymph nodes. Reproductive: Uterus is surgically absent. No concerning adnexal lesions. Question appearance of the left ovary. Right ovary is poorly visualized, possibly obscured by streak artifact. Other: No abdominopelvic free fluid or free gas. No bowel containing hernias. Small fat containing umbilical hernia and right para umbilical hernia. Musculoskeletal: Exaggerated lumbar lordosis. Anterolisthesis of L4 on 5 without associated spondylolysis. Multilevel degenerative changes are present in the imaged portions of the spine. Interspinous arthrosis compatible with Baastrup's disease. Additional degenerative changes in the pelvis including severe arthrosis of the left hip and prior right hip  arthroplasty with heterotopic ossification. No acute or worrisome osseous lesions. IMPRESSION: 1. Consolidative opacity in the superior segment left lower lobe only partially imaged on this exam. Could reflect atelectasis or pneumonia. Correlate with clinical findings. 2. Trace bilateral pleural effusions with adjacent passive atelectasis. 3. No acute abdominopelvic abnormality to provide cause for patient's symptoms. 4. Bilateral extrarenal pelves with at most mild hydronephrosis but with normal caliber change by the level of the ureteropelvic junctions.  Findings could reflect chronic UPJ obstruction particularly given the absence of visible urolithiasis or other acute urinary tract abnormality. 5. Cholelithiasis without evidence of acute cholecystitis. 6. Cystic lesions in the pancreatic head and tail measuring up to 11 mm in size. These could reflect pseudocysts or cystic pancreatic neoplasms. In the absence of acute biliary symptoms, consider reimaging with dedicated contrast enhanced MRI or pancreatic protocol CT at 2 years. This recommendation follows ACR consensus guidelines: Management of Incidental Pancreatic Cysts: Michaele Amundson White Paper of the ACR Incidental Findings Committee. J Am Coll Radiol 8828;00:349-179. 7. Colonic diverticulosis without evidence of diverticulitis. 8. Severe left hip arthrosis. 9. Aortic Atherosclerosis (ICD10-I70.0). Electronically Signed   By: Lovena Le M.D.   On: 08/24/2019 01:06   DG Chest Port 1 View  Result Date: 08/23/2019 CLINICAL DATA:  Weakness EXAM: PORTABLE CHEST 1 VIEW COMPARISON:  12/01/2014 FINDINGS: Linear scarring or atelectasis in the right mid lung. Mild cardiomegaly with central vascular congestion. Linear atelectasis or scar in the left base. No consolidation or effusion. Aortic atherosclerosis. No pneumothorax. IMPRESSION: Cardiomegaly with mild central vascular congestion. Electronically Signed   By: Donavan Foil M.D.   On: 08/23/2019 23:01        Scheduled Meds: . acetaminophen  1,000 mg Oral Q8H  . brimonidine  1 drop Both Eyes BID  . cholecalciferol  2,000 Units Oral Daily  . diclofenac Sodium  2 g Topical QID  . ferrous sulfate  325 mg Oral BID WC  . furosemide  20 mg Oral Daily  . latanoprost  1 drop Both Eyes QHS  . metoprolol succinate  50 mg Oral BID   Continuous Infusions: . magnesium sulfate bolus IVPB       LOS: 0 days    Time spent: over 30 min    Fayrene Helper, MD [facility]   To contact the attending provider between 7A-7P or the covering provider during  after hours 7P-7A, please log into the web site [email or web address] and access using universal [place] password for that web site. If you do not have the password, please call the hospital operator.  08/24/2019, 4:27 PM

## 2019-08-25 ENCOUNTER — Inpatient Hospital Stay (HOSPITAL_COMMUNITY): Payer: Medicare Other

## 2019-08-25 LAB — HIV ANTIBODY (ROUTINE TESTING W REFLEX): HIV Screen 4th Generation wRfx: NONREACTIVE

## 2019-08-25 LAB — CBC WITH DIFFERENTIAL/PLATELET
Abs Immature Granulocytes: 0.04 10*3/uL (ref 0.00–0.07)
Basophils Absolute: 0.1 10*3/uL (ref 0.0–0.1)
Basophils Relative: 1 %
Eosinophils Absolute: 0.3 10*3/uL (ref 0.0–0.5)
Eosinophils Relative: 4 %
HCT: 35.1 % — ABNORMAL LOW (ref 36.0–46.0)
Hemoglobin: 11.3 g/dL — ABNORMAL LOW (ref 12.0–15.0)
Immature Granulocytes: 0 %
Lymphocytes Relative: 27 %
Lymphs Abs: 2.5 10*3/uL (ref 0.7–4.0)
MCH: 30.1 pg (ref 26.0–34.0)
MCHC: 32.2 g/dL (ref 30.0–36.0)
MCV: 93.4 fL (ref 80.0–100.0)
Monocytes Absolute: 1.2 10*3/uL — ABNORMAL HIGH (ref 0.1–1.0)
Monocytes Relative: 13 %
Neutro Abs: 5.1 10*3/uL (ref 1.7–7.7)
Neutrophils Relative %: 55 %
Platelets: 214 10*3/uL (ref 150–400)
RBC: 3.76 MIL/uL — ABNORMAL LOW (ref 3.87–5.11)
RDW: 14.6 % (ref 11.5–15.5)
WBC: 9.2 10*3/uL (ref 4.0–10.5)
nRBC: 0 % (ref 0.0–0.2)

## 2019-08-25 LAB — COMPREHENSIVE METABOLIC PANEL
ALT: 15 U/L (ref 0–44)
AST: 17 U/L (ref 15–41)
Albumin: 2.6 g/dL — ABNORMAL LOW (ref 3.5–5.0)
Alkaline Phosphatase: 54 U/L (ref 38–126)
Anion gap: 8 (ref 5–15)
BUN: 18 mg/dL (ref 8–23)
CO2: 23 mmol/L (ref 22–32)
Calcium: 8.5 mg/dL — ABNORMAL LOW (ref 8.9–10.3)
Chloride: 105 mmol/L (ref 98–111)
Creatinine, Ser: 0.96 mg/dL (ref 0.44–1.00)
GFR calc Af Amer: >60 mL/min (ref >60)
GFR calc non Af Amer: 54 mL/min — ABNORMAL LOW (ref >60)
Glucose, Bld: 117 mg/dL — ABNORMAL HIGH (ref 70–99)
Potassium: 4.2 mmol/L (ref 3.5–5.1)
Sodium: 136 mmol/L (ref 135–145)
Total Bilirubin: 0.8 mg/dL (ref 0.3–1.2)
Total Protein: 6 g/dL — ABNORMAL LOW (ref 6.5–8.1)

## 2019-08-25 LAB — MAGNESIUM: Magnesium: 1.5 mg/dL — ABNORMAL LOW (ref 1.7–2.4)

## 2019-08-25 LAB — BRAIN NATRIURETIC PEPTIDE: B Natriuretic Peptide: 644.1 pg/mL — ABNORMAL HIGH (ref 0.0–100.0)

## 2019-08-25 LAB — PHOSPHORUS: Phosphorus: 3.1 mg/dL (ref 2.5–4.6)

## 2019-08-25 MED ORDER — FUROSEMIDE 10 MG/ML IJ SOLN
20.0000 mg | Freq: Every day | INTRAMUSCULAR | Status: DC
  Administered 2019-08-25: 20 mg via INTRAVENOUS
  Filled 2019-08-25: qty 2

## 2019-08-25 MED ORDER — AZITHROMYCIN 500 MG PO TABS
500.0000 mg | ORAL_TABLET | Freq: Every day | ORAL | Status: DC
  Administered 2019-08-25 – 2019-08-27 (×3): 500 mg via ORAL
  Filled 2019-08-25 (×3): qty 1

## 2019-08-25 MED ORDER — IOHEXOL 300 MG/ML  SOLN
75.0000 mL | Freq: Once | INTRAMUSCULAR | Status: AC | PRN
  Administered 2019-08-25: 75 mL via INTRAVENOUS

## 2019-08-25 MED ORDER — SODIUM CHLORIDE 0.9 % IV SOLN
2.0000 g | INTRAVENOUS | Status: DC
  Administered 2019-08-25 – 2019-08-26 (×2): 2 g via INTRAVENOUS
  Filled 2019-08-25 (×3): qty 20

## 2019-08-25 MED ORDER — FUROSEMIDE 10 MG/ML IJ SOLN: 40.0000 mg | Freq: Every day | INTRAMUSCULAR | Status: DC

## 2019-08-25 MED ORDER — FUROSEMIDE 10 MG/ML IJ SOLN: 40.0000 mg | Freq: Once | INTRAMUSCULAR | Status: DC

## 2019-08-25 NOTE — Progress Notes (Signed)
PROGRESS NOTE    Dana Bradford  GYI:[id number] DOB: October 25, 1930 DOA: 08/23/2019 PCP: Jonathon Resides, MD   Chief Complaint  Patient presents with  . Weakness  . Flank Pain    Brief Narrative:   Dana Bradford is Jeziel Bradford 86 y.o. female with medical history significant for Afib (on Eliquis), venous stasis dermatitis, HLD, Hypothyroidism, hypertension, RLS, s/p bilateral knee replacement who presents to the emergency department with several complaints, she complains of Dana Bradford 4 to 5-day onset of generalized weakness, back pain and abdominal pain.  Patient states that the back pain worsens with ambulation and this has resulted in limiting mobility as well as ability to care for self.  Abdominal pain was epigastric and right upper quadrant, this was associated with nausea without vomiting.  Patient also complained of difficulty in being able to control Dana Bradford. fib since diagnosis in February of this year, though she states that she did not take her medication (Eliquis or metoprolol) last night.  She states that she has been following with Dana Bradford cardiologist.  She also complained of intermittent constipation/diarrhea but she denies any diagnosis of IBS.  Patient denies fever, chills, chest pain, shortness of breath  ED Course:  In the emergency department, she was tachycardic, and intermittently tachypneic.  Work-up in the ED showed leukocytosis, Albumin 3.2.  CT abdomen and pelvis showed no acute abdominal pelvic abnormality, but showed cystic lesions in the pancreatic head and tail measuring up to 11 mm in size which could reflect pseudocysts or cystic pancreatic neoplasms.  She was treated with Eliquis and Metoprolol.  Hospitalist was asked to admit patient for further evaluation and management.  Assessment & Plan:   Principal Problem:   Back pain Active Problems:   Restless leg syndrome   Essential hypertension, benign   GERD (gastroesophageal reflux disease)   Generalized weakness  Acute on chronic back  pain Generalized weakness CT abdomen/pelvis without clear cause for her sx - ? Consolidative opacity in superior segment LLL, mild hydro (?chronic UPJ obstruction), cystic lesions in pancreatic head and tail, and severe L hip arthrosis No numbness, weakness, bowel or bladder sx  She's no longer taking norco/robaxin/tramadol, notes these were old Will start pain control with scheduled APAP, oxycodone prn, voltaren gel - suspect pain is musculoskeletal given worsening with movement Consider additional imaging with MRI lumbar spine if she's not improving with above treatment Follow UA for mild hydro, discussed with urology, can follow outpatient if desired PT/OT, OOB  Community Acquired Pneumonia CT notable for multifocal airspace disease in the RUL and LLL likely multifocal pneumonia Will start ceftriaxone/azithromycin Sputum, urine strep, urine legionella She's doing well on RA, but this may contribute to generalized weakness/malaise above  Elevated BNP  Diastolic HF: Echo from 04/5007 with EF 55-60%, indeterminate doppler parameters for diastolic function  CXR this AM with findings concerning for HF, elevated BNP CT chest with findings concerning for pneumonia Exam without overt overload, but with elevated BNP, will transition lasix to IV 20 mg daily with holding parameters and follow  Dana Bradford with RVR(on Eliquis) RVR improved Continue metop and dilt with holding parameters Continue eliquis  Essential hypertension Continue metoprolol.  Diltiazem.  Avalide on hold.   Hypothyroidism Continue Synthroid   Hyperlipidemia Continue simvastatin   GERD Continue Pepcid   Hypokalemia  Hypomagnesemia: replace and follow  Cystic Lesions of pancreatic head and tail: need outpatient follow up  DVT prophylaxis: eliquis Code Status: full  Family Communication: none at bedside - called, daughter Disposition:  Status is: Inpatient  Remains inpatient appropriate because:Inpatient  level of care appropriate due to severity of illness   Dispo: The patient is from: Home              Anticipated d/c is to: pending              Anticipated d/c date is: 1 day              Patient currently is not medically stable to d/c.   Consultants:   none  Procedures:   none  Antimicrobials:  Anti-infectives (From admission, onward)   Start     Dose/Rate Route Frequency Ordered Stop   08/25/19 1630  cefTRIAXone (ROCEPHIN) 2 g in sodium chloride 0.9 % 100 mL IVPB     Discontinue     2 g 200 mL/hr over 30 Minutes Intravenous Every 24 hours 08/25/19 1545 08/30/19 1629   08/25/19 1630  azithromycin (ZITHROMAX) tablet 500 mg     Discontinue     500 mg Oral Daily 08/25/19 1545 08/30/19 0959      Subjective: Feels maybe Dana Bradford bit better  Objective: Vitals:   08/25/19 0454 08/25/19 0648 08/25/19 0850 08/25/19 1153  BP: 99/70 101/69 100/71 99/70  Pulse: 60 68 69 63  Resp: 18 18 18    Temp: (!) 97.5 F (36.4 C) 97.6 F (36.4 C) 97.6 F (36.4 C)   TempSrc: Oral Oral Oral   SpO2: 93% 97% 95%   Weight:      Height:        Intake/Output Summary (Last 24 hours) at 08/25/2019 1619 Last data filed at 08/25/2019 1300 Gross per 24 hour  Intake 540 ml  Output 800 ml  Net -260 ml   Filed Weights   08/24/19 0842  Weight: 67.1 kg    Examination:  General: No acute distress. Cardiovascular: irregularly irregular Lungs: Clear to auscultation bilaterally Abdomen: Soft, nontender, nondistended  Neurological: Alert and oriented 3. Moves all extremities 4. Cranial nerves II through XII grossly intact. Skin: Warm and dry. No rashes or lesions. Extremities: trace edema    Data Reviewed: I have personally reviewed following labs and imaging studies  CBC: Recent Labs  Lab 08/23/19 2240 08/24/19 0512 08/25/19 0408  WBC 13.3* 13.0* 9.2  NEUTROABS 10.6*  --  5.1  HGB 12.7 11.5* 11.3*  HCT 39.6 35.0* 35.1*  MCV 94.7 93.3 93.4  PLT 220 225 751    Basic Metabolic  Panel: Recent Labs  Lab 08/23/19 2240 08/24/19 0512 08/25/19 0408  NA 136 135 136  K 3.6 3.1* 4.2  CL 102 102 105  CO2 23 22 23   GLUCOSE 108* 94 117*  BUN 15 13 18   CREATININE 0.83 0.79 0.96  CALCIUM 9.0 8.7* 8.5*  MG  --  1.3* 1.5*  PHOS  --  3.0 3.1    GFR: Estimated Creatinine Clearance: 34.1 mL/min (by C-G formula based on SCr of 0.96 mg/dL).  Liver Function Tests: Recent Labs  Lab 08/23/19 2240 08/24/19 0512 08/25/19 0408  AST 25 20 17   ALT 20 18 15   ALKPHOS 67 59 54  BILITOT 1.2 1.3* 0.8  PROT 7.0 6.2* 6.0*  ALBUMIN 3.2* 2.9* 2.6*    CBG: No results for input(s): GLUCAP in the last 168 hours.   Recent Results (from the past 240 hour(s))  SARS Coronavirus 2 by RT PCR (hospital order, performed in [facility] lab) Nasopharyngeal Nasopharyngeal Swab     Status: None   Collection Time:  08/24/19  5:48 AM   Specimen: Nasopharyngeal Swab  Result Value Ref Range Status   SARS Coronavirus 2 NEGATIVE NEGATIVE Final    Comment: (NOTE) SARS-CoV-2 target nucleic acids are NOT DETECTED.  The SARS-CoV-2 RNA is generally detectable in upper and lower respiratory specimens during the acute phase of infection. The lowest concentration of SARS-CoV-2 viral copies this assay can detect is 250 copies / mL. Fritz Cauthon negative result does not preclude SARS-CoV-2 infection and should not be used as the sole basis for treatment or other patient management decisions.  Riann Oman negative result may occur with improper specimen collection / handling, submission of specimen other than nasopharyngeal swab, presence of viral mutation(s) within the areas targeted by this assay, and inadequate number of viral copies (<250 copies / mL). Cornelious Diven negative result must be combined with clinical observations, patient history, and epidemiological information.  Fact Sheet for Patients:   StrictlyIdeas.no  Fact Sheet for Healthcare  Providers: BankingDealers.co.za  This test is not yet approved or  cleared by the [place] FDA and has been authorized for detection and/or diagnosis of SARS-CoV-2 by FDA under an Emergency Use Authorization (EUA).  This EUA will remain in effect (meaning this test can be used) for the duration of the COVID-19 declaration under Section 564(b)(1) of the Act, 21 U.S.C. section 360bbb-3(b)(1), unless the authorization is terminated or revoked sooner.  Performed at [facility], [place] [address], [place], [place] [address]          Radiology Studies: CT CHEST W CONTRAST  Result Date: 08/25/2019 CLINICAL DATA:  Possible right perihilar abnormality on recent chest x-ray. Evaluate for underlying mass or adenopathy. EXAM: CT CHEST WITH CONTRAST TECHNIQUE: Multidetector CT imaging of the chest was performed during intravenous contrast administration. CONTRAST:  39mL OMNIPAQUE IOHEXOL 300 MG/ML  SOLN COMPARISON:  Chest x-ray today, CT abdomen 08/24/2019 FINDINGS: Cardiovascular: Mild cardiomegaly. Mild calcified plaque over the left anterior descending and lateral circumflex coronary arteries as well as right coronary arteries. Thoracic aorta is normal in caliber without aneurysm or dissection. Pulmonary arterial system is unremarkable. Mediastinum/Nodes: No mediastinal or hilar adenopathy. Remaining mediastinal structures are unremarkable. Lungs/Pleura: Lungs are adequately inflated demonstrate patchy airspace consolidation over the posterior right upper lobe and left lower lobe likely multifocal infection. Tiny amount left pleural fluid with associated basilar atelectasis. Airways are normal. Upper Abdomen: Several liver cysts are present. There are Timothee Gali couple small hypodensities over the pancreas unchanged and better evaluated on the abdominal CT scan. Mild prominence of the left intrarenal collecting system with prominent extrarenal pelvis without significant change.  Calcified plaque over the abdominal aorta. Musculoskeletal: Degenerative change of the spine. IMPRESSION: 1. Multifocal airspace process involving the right upper lobe and left lower lobe likely multifocal pneumonia. Tiny amount left pleural fluid with associated basilar atelectasis. 2. Aortic Atherosclerosis (ICD10-I70.0). Atherosclerotic coronary artery disease. 3. Stable liver cysts and stable small pancreatic cystic lesions better evaluated on recent abdominal CT. See recommendations as recommend follow-up CT 2 years. 4. Mild prominence of the left intrarenal collecting system and mildly prominent left extrarenal pelvis unchanged. Electronically Signed   By: Marin Olp M.D.   On: 08/25/2019 14:42   CT ABDOMEN PELVIS W CONTRAST  Result Date: 08/24/2019 CLINICAL DATA:  Right lower quadrant pain radiating to lower back for 3 weeks, associated nausea EXAM: CT ABDOMEN AND PELVIS WITH CONTRAST TECHNIQUE: Multidetector CT imaging of the abdomen and pelvis was performed using the standard protocol following bolus administration of intravenous contrast. CONTRAST:  185mL OMNIPAQUE IOHEXOL 300 MG/ML  SOLN COMPARISON:  Renal ultrasound 11/30/2014, abdominal ultrasound 04/16/1998 (report only), CT abdomen 04/13/1998 (report only) FINDINGS: Lower chest: Consolidative opacity seen in the superior segment left lower lobe only partially imaged on this exam. Trace bilateral pleural effusions with adjacent passive atelectasis. Additional bandlike areas of subsegmental atelectasis or scarring bilaterally. Mild cardiomegaly with predominantly biatrial enlargement. Dense mitral annular calcifications are noted. Additional calcifications present on the aortic leaflets. Three-vessel coronary artery disease is noted. Distal thoracic aortic atherosclerosis as well. Hepatobiliary: Multiple hypoattenuating subcentimeter foci throughout the liver too small to fully characterize on CT imaging but statistically likely benign. Harriette Tovey larger  fluid attenuation probable hepatic cyst seen in the right lobe measuring up to 3.6 cm in size. No concerning focal liver abnormality. Smooth surface contour. Normal liver attenuation. Gallbladder contains Jaisen Wiltrout partially calcified gallstone towards the fundus. No pericholecystic fluid or inflammation. No biliary ductal dilatation or calcified intraductal gallstones are seen. Pancreas: Cystic focus seen at the pancreatic head measuring 11 mm in size (3/32). Additional cystic lesion in the pancreatic tail measuring up to 12 mm in size (3/22) partial fatty replacement of the pancreas. No ductal dilatation. No peripancreatic inflammation. Spleen: Prior splenectomy with some residual splenic tissue along the operative bed and in the left upper quadrant. Adrenals/Urinary Tract: Normal adrenal glands. No visible or concerning renal lesions. Bilateral extrarenal pelves with at most mild hydronephrosis but with normal caliber change by the level of the ureteropelvic junctions. No visible obstructing calculus other urolithiasis. Urinary bladder is partially decompressed no gross bladder abnormality. Stomach/Bowel: Distal esophagus, stomach and duodenal sweep are unremarkable. No small bowel wall thickening or dilatation. No evidence of obstruction. Nuri Branca normal appendix is visualized. No colonic dilatation or wall thickening. Scattered colonic diverticula without focal inflammation to suggest diverticulitis. Vascular/Lymphatic: Atherosclerotic calcifications throughout the abdominal aorta and branch vessels. No aneurysm or ectasia. No enlarged abdominopelvic lymph nodes. Reproductive: Uterus is surgically absent. No concerning adnexal lesions. Question appearance of the left ovary. Right ovary is poorly visualized, possibly obscured by streak artifact. Other: No abdominopelvic free fluid or free gas. No bowel containing hernias. Small fat containing umbilical hernia and right para umbilical hernia. Musculoskeletal: Exaggerated lumbar  lordosis. Anterolisthesis of L4 on 5 without associated spondylolysis. Multilevel degenerative changes are present in the imaged portions of the spine. Interspinous arthrosis compatible with Baastrup's disease. Additional degenerative changes in the pelvis including severe arthrosis of the left hip and prior right hip arthroplasty with heterotopic ossification. No acute or worrisome osseous lesions. IMPRESSION: 1. Consolidative opacity in the superior segment left lower lobe only partially imaged on this exam. Could reflect atelectasis or pneumonia. Correlate with clinical findings. 2. Trace bilateral pleural effusions with adjacent passive atelectasis. 3. No acute abdominopelvic abnormality to provide cause for patient's symptoms. 4. Bilateral extrarenal pelves with at most mild hydronephrosis but with normal caliber change by the level of the ureteropelvic junctions. Findings could reflect chronic UPJ obstruction particularly given the absence of visible urolithiasis or other acute urinary tract abnormality. 5. Cholelithiasis without evidence of acute cholecystitis. 6. Cystic lesions in the pancreatic head and tail measuring up to 11 mm in size. These could reflect pseudocysts or cystic pancreatic neoplasms. In the absence of acute biliary symptoms, consider reimaging with dedicated contrast enhanced MRI or pancreatic protocol CT at 2 years. This recommendation follows ACR consensus guidelines: Management of Incidental Pancreatic Cysts: Zander Ingham White Paper of the ACR Incidental Findings Committee. J Am Coll Radiol 6761;[id number]. 7. Colonic diverticulosis without  evidence of diverticulitis. 8. Severe left hip arthrosis. 9. Aortic Atherosclerosis (ICD10-I70.0). Electronically Signed   By: Lovena Le M.D.   On: 08/24/2019 01:06   DG CHEST PORT 1 VIEW  Result Date: 08/25/2019 CLINICAL DATA:  abnormal chest x-ray EXAM: PORTABLE CHEST 1 VIEW COMPARISON:  August 23, 2019 FINDINGS: The aorta is tortuous. The heart size is  enlarged. Increased opacity is identified in the right perihilar region. Increased pulmonary interstitium is identified bilaterally. There is no pleural effusion. The bony structures are stable. IMPRESSION: 1. Probable congestive heart failure. 2. Increased opacity identified in the right perihilar region, recommend further evaluation with chest CT to exclude underlying mass. Electronically Signed   By: Abelardo Diesel M.D.   On: 08/25/2019 07:45   DG Chest Port 1 View  Result Date: 08/23/2019 CLINICAL DATA:  Weakness EXAM: PORTABLE CHEST 1 VIEW COMPARISON:  12/01/2014 FINDINGS: Linear scarring or atelectasis in the right mid lung. Mild cardiomegaly with central vascular congestion. Linear atelectasis or scar in the left base. No consolidation or effusion. Aortic atherosclerosis. No pneumothorax. IMPRESSION: Cardiomegaly with mild central vascular congestion. Electronically Signed   By: Donavan Foil M.D.   On: 08/23/2019 23:01        Scheduled Meds: . acetaminophen  1,000 mg Oral Q8H  . apixaban  5 mg Oral BID  . azithromycin  500 mg Oral Daily  . brimonidine  1 drop Both Eyes BID  . cholecalciferol  2,000 Units Oral Daily  . diclofenac Sodium  2 g Topical QID  . diltiazem  30 mg Oral Q6H  . famotidine  20 mg Oral BID  . ferrous sulfate  325 mg Oral BID WC  . furosemide  20 mg Intravenous Daily  . latanoprost  1 drop Both Eyes QHS  . levothyroxine  88 mcg Oral Q0600  . metoprolol succinate  50 mg Oral BID  . polyethylene glycol  17 g Oral BID  . pramipexole  1 mg Oral QHS  . simvastatin  20 mg Oral QPM  . timolol  1 drop Both Eyes BID   Continuous Infusions: . cefTRIAXone (ROCEPHIN)  IV    . magnesium sulfate bolus IVPB       LOS: 1 day    Time spent: over 30 min    Fayrene Helper, MD [facility]   To contact the attending provider between 7A-7P or the covering provider during after hours 7P-7A, please log into the web site [email or web address] and access using universal  [place] password for that web site. If you do not have the password, please call the hospital operator.  08/25/2019, 4:19 PM

## 2019-08-25 NOTE — Discharge Instructions (Signed)

## 2019-08-25 NOTE — Evaluation (Signed)
Occupational Therapy Evaluation Patient Details Name: Dana Bradford MRN: [id number] DOB: September 05, 1930 Today's Date: 08/25/2019    History of Present Illness Pt is an 86 y/o female admitted secondary to back pain with no known mechanism of injury. Per MD, back pain is chronic despite pt denying this upon evaluation. PMH including but not limited to Afib (on Eliquis), venous stasis dermatitis, HLD, Hypothyroidism, hypertension, RLS, s/p bilateral knee replacement.   Clinical Impression   This 86 y/o female presents with the above. PTA pt reports being mod independent with ADL and functional mobility, living alone. Pt overall requiring minguard assist for room level mobility with RW today, requiring up to minA for ADL. Max HR noted 115 with mobility to bathroom. Pt to benefit from continued acute OT services and currently recommend follow up [facility] services after discharge to maximize her overall safety and independence with ADL and mobility.     Follow Up Recommendations  Home health OT;Supervision/Assistance - 24 hour (24hr initially)    Equipment Recommendations  None recommended by OT           Precautions / Restrictions Precautions Precautions: Fall Precaution Comments: monitor HR Restrictions Weight Bearing Restrictions: No      Mobility Bed Mobility Overal bed mobility: Modified Independent                Transfers Overall transfer level: Needs assistance Equipment used: Rolling walker (2 wheeled) Transfers: Sit to/from Stand Sit to Stand: Min guard         General transfer comment: for balance and safety    Balance Overall balance assessment: Needs assistance Sitting-balance support: Feet supported Sitting balance-Leahy Scale: Good     Standing balance support: During functional activity;Bilateral upper extremity supported;Single extremity supported Standing balance-Leahy Scale: Poor                             ADL either performed or assessed  with clinical judgement   ADL Overall ADL's : Needs assistance/impaired Eating/Feeding: Modified independent;Sitting   Grooming: Min guard;Wash/dry hands;Standing   Upper Body Bathing: Set up;Supervision/ safety;Sitting   Lower Body Bathing: Minimal assistance;Sit to/from stand   Upper Body Dressing : Set up;Supervision/safety;Sitting   Lower Body Dressing: Minimal assistance;Sit to/from stand   Toilet Transfer: Min guard;Ambulation;RW;Grab bars Toilet Transfer Details (indicate cue type and reason): close minguard for safety  Toileting- Clothing Manipulation and Hygiene: Min guard;Supervision/safety;Sitting/lateral lean;Sit to/from stand Toileting - Clothing Manipulation Details (indicate cue type and reason): performing pericare without assist      Functional mobility during ADLs: Surveyor, minerals     Praxis      Pertinent Vitals/Pain Faces Pain Scale: No hurt     Hand Dominance     Extremity/Trunk Assessment Upper Extremity Assessment Upper Extremity Assessment: Generalized weakness   Lower Extremity Assessment Lower Extremity Assessment: Defer to PT evaluation   Cervical / Trunk Assessment Cervical / Trunk Assessment: Kyphotic   Communication Communication Communication: HOH   Cognition Arousal/Alertness: Awake/alert Behavior During Therapy: WFL for tasks assessed/performed Overall Cognitive Status: Within Functional Limits for tasks assessed                                 General Comments: not formally assessed but [facility] for general conversation   General Comments  Exercises     Shoulder Instructions      Home Living Family/patient expects to be discharged to:: Private residence Living Arrangements: Alone Available Help at Discharge: Family;Available PRN/intermittently Type of Home: House Home Access: Stairs to enter [employer] of Steps: 3 Entrance Stairs-Rails:  Right;Left Home Layout: One level     Bathroom Shower/Tub: Walk-in shower         Home Equipment: Environmental consultant - 2 wheels;Cane - single point;Shower seat   Additional Comments: reported that her daughter could stay with her if needed      Prior Functioning/Environment Level of Independence: Independent with assistive device(s)        Comments: ambulates with a RW or cane; daughter drives        OT Problem List: Decreased strength;Decreased range of motion;Decreased activity tolerance;Impaired balance (sitting and/or standing);Decreased knowledge of use of DME or AE;Cardiopulmonary status limiting activity      OT Treatment/Interventions: Self-care/ADL training;Therapeutic exercise;Energy conservation;DME and/or AE instruction;Therapeutic activities;Patient/family education;Balance training    OT Goals(Current goals can be found in the care plan section) Acute Rehab OT Goals Patient Stated Goal: "go home" OT Goal Formulation: With patient Time For Goal Achievement: 09/08/19 Potential to Achieve Goals: Good  OT Frequency: Min 2X/week   Barriers to D/C:            Co-evaluation              AM-PAC OT "6 Clicks" Daily Activity     Outcome Measure Help from another person eating meals?: None Help from another person taking care of personal grooming?: A Little Help from another person toileting, which includes using toliet, bedpan, or urinal?: A Little Help from another person bathing (including washing, rinsing, drying)?: A Little Help from another person to put on and taking off regular upper body clothing?: A Little Help from another person to put on and taking off regular lower body clothing?: A Little 6 Click Score: 19   End of Session Equipment Utilized During Treatment: Gait belt;Rolling walker Nurse Communication: Mobility status  Activity Tolerance: Patient tolerated treatment well Patient left: in bed;with call bell/phone within reach;with bed alarm  set  OT Visit Diagnosis: Unsteadiness on feet (R26.81);Muscle weakness (generalized) (M62.81)                Time: 3354-5625 OT Time Calculation (min): 19 min Charges:  OT General Charges $OT Visit: 1 Visit OT Evaluation $OT Eval Moderate Complexity: [place], OT Acute Rehabilitation Services Pager [phone] Office [place] 08/25/2019, 5:38 PM

## 2019-08-25 NOTE — Plan of Care (Signed)
  Problem: Clinical Measurements: Goal: Diagnostic test results will improve Outcome: Progressing Goal: Respiratory complications will improve Outcome: Progressing Goal: Cardiovascular complication will be avoided Outcome: Progressing   Problem: Nutrition: Goal: Adequate nutrition will be maintained Outcome: Progressing   

## 2019-08-25 NOTE — Progress Notes (Signed)
OT Cancellation Note  Patient Details Name: Dana Bradford MRN: [id number] DOB: 16-May-1930   Cancelled Treatment:    Reason Eval/Treat Not Completed: Other (comment); pt reports just up to bathroom and lunch now arriving, request OT return at a later time. Will follow up as able.  Lou Cal, OT Acute Rehabilitation Services Pager [phone] Office [phone]   Raymondo Band 08/25/2019, 12:02 PM

## 2019-08-26 DIAGNOSIS — I4891 Unspecified atrial fibrillation: Secondary | ICD-10-CM

## 2019-08-26 LAB — CBC WITH DIFFERENTIAL/PLATELET
Abs Immature Granulocytes: 0.03 10*3/uL (ref 0.00–0.07)
Basophils Absolute: 0.1 10*3/uL (ref 0.0–0.1)
Basophils Relative: 1 %
Eosinophils Absolute: 0.4 10*3/uL (ref 0.0–0.5)
Eosinophils Relative: 6 %
HCT: 37.3 % (ref 36.0–46.0)
Hemoglobin: 12.3 g/dL (ref 12.0–15.0)
Immature Granulocytes: 0 %
Lymphocytes Relative: 34 %
Lymphs Abs: 2.4 10*3/uL (ref 0.7–4.0)
MCH: 31 pg (ref 26.0–34.0)
MCHC: 33 g/dL (ref 30.0–36.0)
MCV: 94 fL (ref 80.0–100.0)
Monocytes Absolute: 0.9 10*3/uL (ref 0.1–1.0)
Monocytes Relative: 12 %
Neutro Abs: 3.3 10*3/uL (ref 1.7–7.7)
Neutrophils Relative %: 47 %
Platelets: 250 10*3/uL (ref 150–400)
RBC: 3.97 MIL/uL (ref 3.87–5.11)
RDW: 14.7 % (ref 11.5–15.5)
WBC: 7.1 10*3/uL (ref 4.0–10.5)
nRBC: 0 % (ref 0.0–0.2)

## 2019-08-26 LAB — COMPREHENSIVE METABOLIC PANEL
ALT: 15 U/L (ref 0–44)
AST: 16 U/L (ref 15–41)
Albumin: 2.5 g/dL — ABNORMAL LOW (ref 3.5–5.0)
Alkaline Phosphatase: 56 U/L (ref 38–126)
Anion gap: 11 (ref 5–15)
BUN: 24 mg/dL — ABNORMAL HIGH (ref 8–23)
CO2: 26 mmol/L (ref 22–32)
Calcium: 8.4 mg/dL — ABNORMAL LOW (ref 8.9–10.3)
Chloride: 101 mmol/L (ref 98–111)
Creatinine, Ser: 1.17 mg/dL — ABNORMAL HIGH (ref 0.44–1.00)
GFR calc Af Amer: 49 mL/min — ABNORMAL LOW (ref >60)
GFR calc non Af Amer: 42 mL/min — ABNORMAL LOW (ref >60)
Glucose, Bld: 121 mg/dL — ABNORMAL HIGH (ref 70–99)
Potassium: 3.9 mmol/L (ref 3.5–5.1)
Sodium: 138 mmol/L (ref 135–145)
Total Bilirubin: 0.4 mg/dL (ref 0.3–1.2)
Total Protein: 6.4 g/dL — ABNORMAL LOW (ref 6.5–8.1)

## 2019-08-26 LAB — PHOSPHORUS: Phosphorus: 4 mg/dL (ref 2.5–4.6)

## 2019-08-26 LAB — MAGNESIUM: Magnesium: 1.5 mg/dL — ABNORMAL LOW (ref 1.7–2.4)

## 2019-08-26 MED ORDER — FUROSEMIDE 20 MG PO TABS: 20.0000 mg | ORAL_TABLET | Freq: Every day | ORAL | Status: DC

## 2019-08-26 MED ORDER — METOPROLOL SUCCINATE ER 50 MG PO TB24
75.0000 mg | ORAL_TABLET | Freq: Two times a day (BID) | ORAL | Status: DC
  Administered 2019-08-26 – 2019-08-27 (×2): 75 mg via ORAL
  Filled 2019-08-26 (×2): qty 1

## 2019-08-26 MED ORDER — METOPROLOL SUCCINATE ER 50 MG PO TB24
50.0000 mg | ORAL_TABLET | Freq: Two times a day (BID) | ORAL | Status: DC
  Administered 2019-08-26: 50 mg via ORAL
  Filled 2019-08-26: qty 1

## 2019-08-26 MED ORDER — MAGNESIUM SULFATE 2 GM/50ML IV SOLN
2.0000 g | Freq: Once | INTRAVENOUS | Status: AC
  Administered 2019-08-26: 2 g via INTRAVENOUS
  Filled 2019-08-26: qty 50

## 2019-08-26 MED ORDER — DILTIAZEM HCL 60 MG PO TABS
30.0000 mg | ORAL_TABLET | Freq: Four times a day (QID) | ORAL | Status: DC
  Administered 2019-08-26 – 2019-08-27 (×4): 30 mg via ORAL
  Filled 2019-08-26 (×6): qty 1

## 2019-08-26 MED ORDER — SODIUM CHLORIDE 0.9 % IV BOLUS
250.0000 mL | Freq: Once | INTRAVENOUS | Status: AC
  Administered 2019-08-26: 250 mL via INTRAVENOUS

## 2019-08-26 NOTE — Progress Notes (Signed)
PROGRESS NOTE    Dana Bradford  CVE:[id number] DOB: 08-17-1930 DOA: 08/23/2019 PCP: Jonathon Resides, MD   Chief Complaint  Patient presents with  . Weakness  . Flank Pain    Brief Narrative:   Dana Bradford is Dana Bradford 86 y.o. female with medical history significant for Afib (on Eliquis), venous stasis dermatitis, HLD, Hypothyroidism, hypertension, RLS, s/p bilateral knee replacement who presents to the emergency department with several complaints, she complains of Raymon Schlarb 4 to 5-day onset of generalized weakness, back pain and abdominal pain.  Patient states that the back pain worsens with ambulation and this has resulted in limiting mobility as well as ability to care for self.  Abdominal pain was epigastric and right upper quadrant, this was associated with nausea without vomiting.  Patient also complained of difficulty in being able to control Juda Lajeunesse. fib since diagnosis in February of this year, though she states that she did not take her medication (Eliquis or metoprolol) last night.  She states that she has been following with Orvil Faraone cardiologist.  She also complained of intermittent constipation/diarrhea but she denies any diagnosis of IBS.  Patient denies fever, chills, chest pain, shortness of breath  ED Course:  In the emergency department, she was tachycardic, and intermittently tachypneic.  Work-up in the ED showed leukocytosis, Albumin 3.2.  CT abdomen and pelvis showed no acute abdominal pelvic abnormality, but showed cystic lesions in the pancreatic head and tail measuring up to 11 mm in size which could reflect pseudocysts or cystic pancreatic neoplasms.  She was treated with Eliquis and Metoprolol.  Hospitalist was asked to admit patient for further evaluation and management.  Assessment & Plan:   Principal Problem:   Back pain Active Problems:   Restless leg syndrome   Essential hypertension, benign   GERD (gastroesophageal reflux disease)   Generalized weakness  Acute on chronic back  pain Generalized weakness CT abdomen/pelvis without clear cause for her sx - ? Consolidative opacity in superior segment LLL, mild hydro (?chronic UPJ obstruction), cystic lesions in pancreatic head and tail, and severe L hip arthrosis No numbness, weakness, bowel or bladder sx  She's no longer taking norco/robaxin/tramadol, notes these were old Will start pain control with scheduled APAP, oxycodone prn, voltaren gel - suspect pain is musculoskeletal given worsening with movement Consider additional imaging with MRI lumbar spine if she's not improving with above treatment Follow UA for mild hydro, discussed with urology, can follow outpatient if desired PT/OT, OOB  Community Acquired Pneumonia CT notable for multifocal airspace disease in the RUL and LLL likely multifocal pneumonia Will start ceftriaxone/azithromycin Sputum, urine strep, urine legionella She's doing well on RA, but this may contribute to generalized weakness/malaise above  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  Hold additional diuresis at this time.  Became hypotensive with lasix 7/4.  AKI: likely in setting of diuresis, hold lasix  Essential hypertension Continue metoprolol.  Diltiazem.  Avalide on hold.   Hypothyroidism Continue Synthroid   Hyperlipidemia Continue simvastatin   GERD Continue Pepcid   Hypokalemia  Hypomagnesemia: replace and follow  Cystic Lesions of pancreatic head and tail: need  outpatient follow up  DVT prophylaxis: eliquis Code Status: full  Family  Communication: daughter at bedside Disposition:   Status is: Inpatient  Remains inpatient appropriate because:Inpatient level of care appropriate due to severity of illness   Dispo: The patient is from: Home              Anticipated d/c is to: pending              Anticipated d/c date is: 1 day              Patient currently is not medically stable to d/c.   Consultants:   none  Procedures:   none  Antimicrobials:  Anti-infectives (From admission, onward)   Start     Dose/Rate Route Frequency Ordered Stop   08/25/19 1630  cefTRIAXone (ROCEPHIN) 2 g in sodium chloride 0.9 % 100 mL IVPB     Discontinue     2 g 200 mL/hr over 30 Minutes Intravenous Every 24 hours 08/25/19 1545 08/30/19 1629   08/25/19 1630  azithromycin (ZITHROMAX) tablet 500 mg     Discontinue     500 mg Oral Daily 08/25/19 1545 08/30/19 0959      Subjective: Feeling better today Not quite at baselien  Objective: Vitals:   08/26/19 0256 08/26/19 0348 08/26/19 0500 08/26/19 0833  BP: (!) 87/64 107/72 118/90 112/73  Pulse:    76  Resp:    18  Temp:    97.8 F (36.6 C)  TempSrc:    Oral  SpO2:    95%  Weight:      Height:        Intake/Output Summary (Last 24 hours) at 08/26/2019 1642 Last data filed at 08/26/2019 1300 Gross per 24 hour  Intake 480 ml  Output 1850 ml  Net -1370 ml   Filed Weights   08/24/19 0842  Weight: 67.1 kg    Examination:  General: No acute distress. Cardiovascular: irreg irregu, tachy Lungs: Clear to auscultation bilaterally  Abdomen: Soft, nontender, nondistended Neurological: Alert and oriented 3. Moves all extremities 4. Cranial nerves II through XII grossly intact. Skin: Warm and dry. No rashes or lesions. Extremities: No clubbing or cyanosis. No edema.   Data Reviewed: I have personally reviewed following labs and imaging studies  CBC: Recent Labs  Lab 08/23/19 2240 08/24/19 0512 08/25/19 0408 08/26/19 0337  WBC 13.3* 13.0* 9.2 7.1  NEUTROABS  10.6*  --  5.1 3.3  HGB 12.7 11.5* 11.3* 12.3  HCT 39.6 35.0* 35.1* 37.3  MCV 94.7 93.3 93.4 94.0  PLT 220 225 214 010    Basic Metabolic Panel: Recent Labs  Lab 08/23/19 2240 08/24/19 0512 08/25/19 0408 08/26/19 0337  NA 136 135 136 138  K 3.6 3.1* 4.2 3.9  CL 102 102 105 101  CO2 23 22 23 26   GLUCOSE 108* 94 117* 121*  BUN 15 13 18  24*  CREATININE 0.83 0.79 0.96 1.17*  CALCIUM 9.0 8.7* 8.5* 8.4*  MG  --  1.3* 1.5* 1.5*  PHOS  --  3.0 3.1 4.0    GFR: Estimated Creatinine Clearance: 28 mL/min (Haji Delaine) (by C-G formula based on SCr of 1.17 mg/dL (H)).  Liver Function Tests: Recent Labs  Lab 08/23/19 2240 08/24/19 0512 08/25/19 0408 08/26/19 0337  AST 25 20 17 16   ALT 20 18 15 15   ALKPHOS 67 59 54 56  BILITOT 1.2 1.3* 0.8 0.4  PROT 7.0 6.2* 6.0* 6.4*  ALBUMIN 3.2* 2.9* 2.6* 2.5*  CBG: No results for input(s): GLUCAP in the last 168 hours.   Recent Results (from the past 240 hour(s))  SARS Coronavirus 2 by RT PCR (hospital order, performed in [facility] hospital lab) Nasopharyngeal Nasopharyngeal Swab     Status: None   Collection Time: 08/24/19  5:48 AM   Specimen: Nasopharyngeal Swab  Result Value Ref Range Status   SARS Coronavirus 2 NEGATIVE NEGATIVE Final    Comment: (NOTE) SARS-CoV-2 target nucleic acids are NOT DETECTED.  The SARS-CoV-2 RNA is generally detectable in upper and lower respiratory specimens during the acute phase of infection. The lowest concentration of SARS-CoV-2 viral copies this assay can detect is 250 copies / mL. Kyrielle Urbanski negative result does not preclude SARS-CoV-2 infection and should not be used as the sole basis for treatment or other patient management decisions.  Rolla Servidio negative result may occur with improper specimen collection / handling, submission of specimen other than nasopharyngeal swab, presence of viral mutation(s) within the areas targeted by this assay, and inadequate number of viral copies (<250 copies / mL). Sufyan Meidinger negative  result must be combined with clinical observations, patient history, and epidemiological information.  Fact Sheet for Patients:   StrictlyIdeas.no  Fact Sheet for Healthcare Providers: BankingDealers.co.za  This test is not yet approved or  cleared by the [place] FDA and has been authorized for detection and/or diagnosis of SARS-CoV-2 by FDA under an Emergency Use Authorization (EUA).  This EUA will remain in effect (meaning this test can be used) for the duration of the COVID-19 declaration under Section 564(b)(1) of the Act, 21 U.S.C. section 360bbb-3(b)(1), unless the authorization is terminated or revoked sooner.  Performed at [facility], [place] [address], [place], [place] [address]          Radiology Studies: CT CHEST W CONTRAST  Result Date: 08/25/2019 CLINICAL DATA:  Possible right perihilar abnormality on recent chest x-ray. Evaluate for underlying mass or adenopathy. EXAM: CT CHEST WITH CONTRAST TECHNIQUE: Multidetector CT imaging of the chest was performed during intravenous contrast administration. CONTRAST:  62mL OMNIPAQUE IOHEXOL 300 MG/ML  SOLN COMPARISON:  Chest x-ray today, CT abdomen 08/24/2019 FINDINGS: Cardiovascular: Mild cardiomegaly. Mild calcified plaque over the left anterior descending and lateral circumflex coronary arteries as well as right coronary arteries. Thoracic aorta is normal in caliber without aneurysm or dissection. Pulmonary arterial system is unremarkable. Mediastinum/Nodes: No mediastinal or hilar adenopathy. Remaining mediastinal structures are unremarkable. Lungs/Pleura: Lungs are adequately inflated demonstrate patchy airspace consolidation over the posterior right upper lobe and left lower lobe likely multifocal infection. Tiny amount left pleural fluid with associated basilar atelectasis. Airways are normal. Upper Abdomen: Several liver cysts are present. There are Johanny Segers couple small  hypodensities over the pancreas unchanged and better evaluated on the abdominal CT scan. Mild prominence of the left intrarenal collecting system with prominent extrarenal pelvis without significant change. Calcified plaque over the abdominal aorta. Musculoskeletal: Degenerative change of the spine. IMPRESSION: 1. Multifocal airspace process involving the right upper lobe and left lower lobe likely multifocal pneumonia. Tiny amount left pleural fluid with associated basilar atelectasis. 2. Aortic Atherosclerosis (ICD10-I70.0). Atherosclerotic coronary artery disease. 3. Stable liver cysts and stable small pancreatic cystic lesions better evaluated on recent abdominal CT. See recommendations as recommend follow-up CT 2 years. 4. Mild prominence of the left intrarenal collecting system and mildly prominent left extrarenal pelvis unchanged. Electronically Signed   By: Marin Olp M.D.   On: 08/25/2019 14:42   DG CHEST PORT 1 VIEW  Result Date: 08/25/2019 CLINICAL DATA:  abnormal chest x-ray EXAM: PORTABLE CHEST 1 VIEW COMPARISON:  August 23, 2019 FINDINGS: The aorta is tortuous. The heart size is enlarged. Increased opacity is identified in the right perihilar region. Increased pulmonary interstitium is identified bilaterally. There is no pleural effusion. The bony structures are stable. IMPRESSION: 1. Probable congestive heart failure. 2. Increased opacity identified in the right perihilar region, recommend further evaluation with chest CT to exclude underlying mass. Electronically Signed   By: Abelardo Diesel M.D.   On: 08/25/2019 07:45        Scheduled Meds: . acetaminophen  1,000 mg Oral Q8H  . apixaban  5 mg Oral BID  . azithromycin  500 mg Oral Daily  . brimonidine  1 drop Both Eyes BID  . cholecalciferol  2,000 Units Oral Daily  . diclofenac Sodium  2 g Topical QID  . diltiazem  30 mg Oral Q6H  . famotidine  20 mg Oral BID  . ferrous sulfate  325 mg Oral BID WC  . latanoprost  1 drop Both Eyes  QHS  . levothyroxine  88 mcg Oral Q0600  . metoprolol succinate  75 mg Oral BID  . polyethylene glycol  17 g Oral BID  . pramipexole  1 mg Oral QHS  . simvastatin  20 mg Oral QPM  . timolol  1 drop Both Eyes BID   Continuous Infusions: . cefTRIAXone (ROCEPHIN)  IV 2 g (08/25/19 1638)  . magnesium sulfate bolus IVPB       LOS: 2 days    Time spent: over 30 min    Fayrene Helper, MD [facility]   To contact the attending provider between 7A-7P or the covering provider during after hours 7P-7A, please log into the web site [email or web address] and access using universal [place] password for that web site. If you do not have the password, please call the hospital operator.  08/26/2019, 4:42 PM

## 2019-08-26 NOTE — Progress Notes (Signed)
Occupational Therapy Treatment Patient Details Name: Dana Bradford MRN: [id number] DOB: 10/31/1930 Today's Date: 08/26/2019    History of present illness Pt is an 86 y/o female admitted secondary to back pain with no known mechanism of injury. Per MD, back pain is chronic despite pt denying this upon evaluation. PMH including but not limited to Afib (on Eliquis), venous stasis dermatitis, HLD, Hypothyroidism, hypertension, RLS, s/p bilateral knee replacement.   OT comments  Pt performed UB dressing with set up and ambulated to bathroom>sink>chair with supervision and RW. No c/o pain, but concerned about her increasing kyphosis. Educated on posture with RW, support at low back and simple shoulder exercises. Daughter in room and supportive, will stay with pt upon discharge.   Follow Up Recommendations  Home health OT;Supervision/Assistance - 24 hour (initially)    Equipment Recommendations  None recommended by OT    Recommendations for Other Services      Precautions / Restrictions Precautions Precautions: Fall       Mobility Bed Mobility               General bed mobility comments: pt seated at EOB upon arrival  Transfers Overall transfer level: Needs assistance Equipment used: Rolling walker (2 wheeled) Transfers: Sit to/from Stand Sit to Stand: Supervision         General transfer comment: for safety    Balance Overall balance assessment: Needs assistance   Sitting balance-Leahy Scale: Good     Standing balance support: During functional activity;Bilateral upper extremity supported;Single extremity supported Standing balance-Leahy Scale: Poor                             ADL either performed or assessed with clinical judgement   ADL Overall ADL's : Needs assistance/impaired     Grooming: Wash/dry hands;Standing;Supervision/safety           Upper Body Dressing : Set up;Sitting       Toilet Transfer: Supervision/safety;Ambulation;RW    Toileting- Clothing Manipulation and Hygiene: Supervision/safety;Sit to/from stand       Functional mobility during ADLs: Supervision/safety;Rolling walker       Vision       Perception     Praxis      Cognition Arousal/Alertness: Awake/alert Behavior During Therapy: WFL for tasks assessed/performed Overall Cognitive Status: Within Functional Limits for tasks assessed                                          Exercises     Shoulder Instructions       General Comments      Pertinent Vitals/ Pain       Pain Assessment: No/denies pain  Home Living                                          Prior Functioning/Environment              Frequency  Min 2X/week        Progress Toward Goals  OT Goals(current goals can now be found in the care plan section)  Progress towards OT goals: Progressing toward goals  Acute Rehab OT Goals Patient Stated Goal: "go home" OT Goal Formulation: With patient Time For Goal Achievement: 09/08/19 Potential to Achieve Goals:  Good  Plan Discharge plan remains appropriate    Co-evaluation                 AM-PAC OT "6 Clicks" Daily Activity     Outcome Measure   Help from another person eating meals?: None Help from another person taking care of personal grooming?: A Little Help from another person toileting, which includes using toliet, bedpan, or urinal?: A Little Help from another person bathing (including washing, rinsing, drying)?: A Little Help from another person to put on and taking off regular upper body clothing?: None Help from another person to put on and taking off regular lower body clothing?: A Little 6 Click Score: 20    End of Session Equipment Utilized During Treatment: Gait belt;Rolling walker  OT Visit Diagnosis: Unsteadiness on feet (R26.81);Muscle weakness (generalized) (M62.81)   Activity Tolerance Patient tolerated treatment well   Patient Left in  chair;with call bell/phone within reach;with family/visitor present   Nurse Communication          Time: 1330-1403 OT Time Calculation (min): 33 min  Charges: OT General Charges $OT Visit: 1 Visit OT Treatments $Self Care/Home Management : 23-37 mins  Nestor Lewandowsky, OTR/L Acute Rehabilitation Services Pager: [phone] Office: [phone]   Malka So 08/26/2019, 4:02 PM

## 2019-08-26 NOTE — Plan of Care (Signed)
  Problem: Activity: Goal: Risk for activity intolerance will decrease Outcome: Progressing   Problem: Safety: Goal: Ability to remain free from injury will improve Outcome: Progressing   

## 2019-08-26 NOTE — TOC Initial Note (Signed)
Transition of Care [facility]) - Initial/Assessment Note    Patient Details  Name: Dana Bradford MRN: [id number] Date of Birth: 31-Mar-1930  Transition of Care [facility]) CM/SW Contact:    Bartholomew Crews, RN Phone Number: [phone] 08/26/2019, 4:37 PM  Clinical Narrative:                  Spoke with patient at the bedside. PTA home alone. States that her daughter will stay with her initially after discharge. Has 5 walkers at home.   Discussed HH recommendations for PT and OT. Patient declines [facility] at this time. PCP verified. Preferred pharmacy verified.   Daughter to provide transportation home.   TOC following for transition needs.  Expected Discharge Plan: Home/Self Care Barriers to Discharge: Continued Medical Work up   Patient Goals and CMS Choice Patient states their goals for this hospitalization and ongoing recovery are:: return home CMS Medicare.gov Compare Post Acute Care list provided to:: Patient Choice offered to / list presented to : Patient  Expected Discharge Plan and Services Expected Discharge Plan: Home/Self Care In-house Referral: NA Discharge Planning Services: CM Consult Post Acute Care Choice: [place] arrangements for the past 2 months: Single Family Home                 DME Arranged: N/A DME Agency: NA       HH Arranged: Refused [place] Agency: NA        Prior Living Arrangements/Services Living arrangements for the past 2 months: Single Family Home Lives with:: Self Patient language and need for interpreter reviewed:: Yes Do you feel safe going back to the place where you live?: Yes      Need for Family Participation in Patient Care: Yes (Comment) Care giver support system in place?: Yes (comment) Current home services: DME (5 walkers) Criminal Activity/Legal Involvement Pertinent to Current Situation/Hospitalization: No - Comment as needed  Activities of Daily Living      Permission Sought/Granted                  Emotional  Assessment Appearance:: Appears stated age Attitude/Demeanor/Rapport: Engaged Affect (typically observed): Accepting Orientation: : Oriented to Self, Oriented to  Time, Oriented to Place, Oriented to Situation Alcohol / Substance Use: Not Applicable Psych Involvement: No (comment)  Admission diagnosis:  Cough [R05] Back pain [M54.9] Epigastric pain [R10.13] Pancreatic mass [K86.89] Atrial fibrillation, unspecified type [facility]) [I48.91] Patient Active Problem List   Diagnosis Date Noted  . Back pain 08/24/2019  . Generalized weakness 08/24/2019  . OA (osteoarthritis) of hip 02/28/2018  . GERD (gastroesophageal reflux disease) 11/13/2012  . Essential hypertension, benign 06/17/2012  . Impaired fasting glucose 06/17/2012  . Other specified acquired hypothyroidism 06/05/2012  . Restless leg syndrome 06/05/2012   PCP:  Jonathon Resides, MD Pharmacy:   [place], [address], [address] [place], [place] [address] Phone: [phone] Fax: [place], [address] [place] [address] Phone: [phone] Fax: [phone]     Social Determinants of Health (SDOH) Interventions    Readmission Risk Interventions No flowsheet data found.

## 2019-08-26 NOTE — Consult Note (Signed)
Cardiology Consultation:   Patient ID: Dana Bradford MRN: [id number]; DOB: April 01, 1930  Admit date: 08/23/2019 Date of Consult: 08/26/2019  Primary Care Provider: Jonathon Resides, MD [facility] Cardiologist: Dawn cardiology [place] Electrophysiologist:  None    Patient Profile:   Dana Bradford is a 86 y.o. female with a hx of persistent afib who is being seen today for the evaluation of afib with RVR at the request of Dr Florene Glen.  History of Present Illness:   [name] history of persistent afib, HL, hypothyroidism, HTN, chronic diastolic HF, admitted with genralized weakness, back pain, abdominal pain. Was found to have pneumonia, being treated by primary team. Cardiology is consulted for afib and issues with elevated heart rates. From notes afib was diagnosed in 03/2019.   - from notes 04/17/19 patient/family did not want to pursue cardioversion or ablation. Family and patient do not recall this conversation.    From [facility] note 07/26/19 were having issues with rate control as outpatient, rate up to 125. From that note diltiazem 180mg  was started. Had been on metoprolol 50mg  bid. An outpatient monitor in 06/2019 showed avg heart rate greater than 100     03/2019 echo [facility] This result has an attachment that is not available.  Normal left ventricle size. Wall thickness is normal. Systolic function is  normal. LV EF: 55-60%. Wall motion is within normal limits. No apical  thrombus. Doppler parameters are indeterminate for diastolic function.   Mitral valve structure is normal. There is mild mitral regurgitation.   Left atrium cavity is mildly dilated.    06/2019 Holter monitor 06/2019 [facility] Event recorder demonstrates atrial fibrillation throughout the recording  Occasional PVCs are noted  Average rate is greater than 100 bpm indicating relatively poor A. fib rate control    Past Medical History:  Diagnosis Date  . Acute kidney failure ([place]) 2016 to 2017 due to  vacomycin   no kidney issues now  . Arthritis   . Food allergy    Fish (Hives)   . Glaucoma   . Hypertension   . Impaired fasting glucose   . Other specified acquired hypothyroidism   . Restless leg   . Restless leg   . Stroke ([place]) 05/2017   ocular stoke on plavix , right eye some vision loss    Past Surgical History:  Procedure Laterality Date  . ABDOMINAL HYSTERECTOMY  1993   Uterine Prolapse partial  . MASTECTOMY  1974   right breast not sure if cancer 1960's  . SMALL TOE RIGHT FOOT REMOVED  04/2017   DUE TO INFECTION  . Spleenectomy  2000  . TOTAL HIP ARTHROPLASTY Right 02/28/2018   Procedure: RIGHT TOTAL HIP ARTHROPLASTY ANTERIOR APPROACH;  Surgeon: Gaynelle Arabian, MD;  Location: [facility];  Service: Orthopedics;  Laterality: Right;  156min      Inpatient Medications: Scheduled Meds: . acetaminophen  1,000 mg Oral Q8H  . apixaban  5 mg Oral BID  . azithromycin  500 mg Oral Daily  . brimonidine  1 drop Both Eyes BID  . cholecalciferol  2,000 Units Oral Daily  . diclofenac Sodium  2 g Topical QID  . diltiazem  30 mg Oral Q6H  . famotidine  20 mg Oral BID  . ferrous sulfate  325 mg Oral BID WC  . [START ON 08/27/2019] furosemide  20 mg Oral Daily  . latanoprost  1 drop Both Eyes QHS  . levothyroxine  88 mcg Oral Q0600  . metoprolol succinate  50 mg  Oral BID  . polyethylene glycol  17 g Oral BID  . pramipexole  1 mg Oral QHS  . simvastatin  20 mg Oral QPM  . timolol  1 drop Both Eyes BID   Continuous Infusions: . cefTRIAXone (ROCEPHIN)  IV 2 g (08/25/19 1638)  . magnesium sulfate bolus IVPB     PRN Meds: methocarbamol  Allergies:    Allergies  Allergen Reactions  . Doxycycline Nausea And Vomiting and Rash    Significant enough to d/c treatment and refuse to take more Other reaction(s): Vomiting (intolerance) The first time she took it she had N/V and the second she had a rash.    . Amlodipine     angioedema  . Arthrotec [Diclofenac-Misoprostol] Diarrhea      Shortness of breath  . Celebrex [Celecoxib] Other (See Comments)    GI Bleeding  . Shellfish Allergy     Itchy rash  . Vancomycin     Acute kidney failure  . Diclofenac Diarrhea  . Latex Rash    Social History:   Social History   Socioeconomic History  . Marital status: Widowed    Spouse name: Not on file  . Number of children: 2  . Years of education: 55  . Highest education level: Not on file  Occupational History  . Occupation: Retired   Tobacco Use  . Smoking status: Former Smoker    Packs/day: 1.00    Years: 30.00    Pack years: 30.00    Types: Cigarettes  . Smokeless tobacco: Never Used  . Tobacco comment: qiot 50 yrs ago  Vaping Use  . Vaping Use: Never used  Substance and Sexual Activity  . Alcohol use: Not Currently  . Drug use: No  . Sexual activity: Not on file  Other Topics Concern  . Not on file  Social History Narrative   Marital Status: Widowed    Children:  Daughter Barbaraann Share) Son Caysie [place])   Pets: None    Living Situation: Lives alone    Occupation: Retired    Education: Programmer, systems    Tobacco:  She quit smoking 35 years ago after having smoked 1 ppd for 20 years.      Alcohol Use:  One glass of wine twice a week     Drug Use:  None   Diet:  Regular   Exercise:  None   Hobbies: Crafting, Gardening.             Social Determinants of Health   Financial Resource Strain:   . Difficulty of Paying Living Expenses:   Food Insecurity:   . Worried About Charity fundraiser in the Last Year:   . Arboriculturist in the Last Year:   Transportation Needs:   . Film/video editor (Medical):   Marland Kitchen Lack of Transportation (Non-Medical):   Physical Activity:   . Days of Exercise per Week:   . Minutes of Exercise per Session:   Stress:   . Feeling of Stress :   Social Connections:   . Frequency of Communication with Friends and Family:   . Frequency of Social Gatherings with Friends and Family:   . Attends Religious  Services:   . Active Member of Clubs or Organizations:   . Attends Archivist Meetings:   Marland Kitchen Marital Status:   Intimate Partner Violence:   . Fear of Current or Ex-Partner:   . Emotionally Abused:   Marland Kitchen Physically Abused:   . Sexually  Abused:     Family History:    Family History  Problem Relation Age of Onset  . Cancer Mother   . Heart disease Father   . Hyperlipidemia Father      ROS:  Please see the history of present illness.  All other ROS reviewed and negative.     Physical Exam/Data:   Vitals:   08/26/19 0256 08/26/19 0348 08/26/19 0500 08/26/19 0833  BP: (!) 87/64 107/72 118/90 112/73  Pulse:    76  Resp:    18  Temp:    97.8 F (36.6 C)  TempSrc:    Oral  SpO2:    95%  Weight:      Height:        Intake/Output Summary (Last 24 hours) at 08/26/2019 1426 Last data filed at 08/26/2019 1300 Gross per 24 hour  Intake 480 ml  Output 1850 ml  Net -1370 ml   Last 3 Weights 08/24/2019 01/15/2019 02/28/2018  Weight (lbs) 147 lb 14.9 oz 160 lb 160 lb  Weight (kg) 67.1 kg 72.576 kg 72.576 kg     Body mass index is 30.92 kg/m.  General:  Well nourished, well developed, in no acute distress HEENT: normal Lymph: no adenopathy Neck: no JVD Endocrine:  No thryomegaly Vascular: No carotid bruits; FA pulses 2+ bilaterally without bruits  Cardiac:  Irreg, no mr/g, no jvd Lungs:  clear to auscultation bilaterally, no wheezing, rhonchi or rales  Abd: soft, nontender, no hepatomegaly  Ext: no edema Musculoskeletal:  No deformities, BUE and BLE strength normal and equal Skin: warm and dry  Neuro:  CNs 2-12 intact, no focal abnormalities noted Psych:  Normal affect     Laboratory Data:  High Sensitivity Troponin:   Recent Labs  Lab 08/23/19 2240 08/24/19 0052  TROPONINIHS 9 9     Chemistry Recent Labs  Lab 08/24/19 0512 08/25/19 0408 08/26/19 0337  NA 135 136 138  K 3.1* 4.2 3.9  CL 102 105 101  CO2 22 23 26   GLUCOSE 94 117* 121*  BUN 13 18 24*    CREATININE 0.79 0.96 1.17*  CALCIUM 8.7* 8.5* 8.4*  GFRNONAA >60 54* 42*  GFRAA >60 >60 49*  ANIONGAP 11 8 11     Recent Labs  Lab 08/24/19 0512 08/25/19 0408 08/26/19 0337  PROT 6.2* 6.0* 6.4*  ALBUMIN 2.9* 2.6* 2.5*  AST 20 17 16   ALT 18 15 15   ALKPHOS 59 54 56  BILITOT 1.3* 0.8 0.4   Hematology Recent Labs  Lab 08/24/19 0512 08/25/19 0408 08/26/19 0337  WBC 13.0* 9.2 7.1  RBC 3.75* 3.76* 3.97  HGB 11.5* 11.3* 12.3  HCT 35.0* 35.1* 37.3  MCV 93.3 93.4 94.0  MCH 30.7 30.1 31.0  MCHC 32.9 32.2 33.0  RDW 14.5 14.6 14.7  PLT 225 214 250   BNP Recent Labs  Lab 08/25/19 0824  BNP 644.1*    DDimer No results for input(s): DDIMER in the last 168 hours.   Radiology/Studies:  CT CHEST W CONTRAST  Result Date: 08/25/2019 CLINICAL DATA:  Possible right perihilar abnormality on recent chest x-ray. Evaluate for underlying mass or adenopathy. EXAM: CT CHEST WITH CONTRAST TECHNIQUE: Multidetector CT imaging of the chest was performed during intravenous contrast administration. CONTRAST:  30mL OMNIPAQUE IOHEXOL 300 MG/ML  SOLN COMPARISON:  Chest x-ray today, CT abdomen 08/24/2019 FINDINGS: Cardiovascular: Mild cardiomegaly. Mild calcified plaque over the left anterior descending and lateral circumflex coronary arteries as well as right coronary arteries. Thoracic aorta is  normal in caliber without aneurysm or dissection. Pulmonary arterial system is unremarkable. Mediastinum/Nodes: No mediastinal or hilar adenopathy. Remaining mediastinal structures are unremarkable. Lungs/Pleura: Lungs are adequately inflated demonstrate patchy airspace consolidation over the posterior right upper lobe and left lower lobe likely multifocal infection. Tiny amount left pleural fluid with associated basilar atelectasis. Airways are normal. Upper Abdomen: Several liver cysts are present. There are a couple small hypodensities over the pancreas unchanged and better evaluated on the abdominal CT scan. Mild  prominence of the left intrarenal collecting system with prominent extrarenal pelvis without significant change. Calcified plaque over the abdominal aorta. Musculoskeletal: Degenerative change of the spine. IMPRESSION: 1. Multifocal airspace process involving the right upper lobe and left lower lobe likely multifocal pneumonia. Tiny amount left pleural fluid with associated basilar atelectasis. 2. Aortic Atherosclerosis (ICD10-I70.0). Atherosclerotic coronary artery disease. 3. Stable liver cysts and stable small pancreatic cystic lesions better evaluated on recent abdominal CT. See recommendations as recommend follow-up CT 2 years. 4. Mild prominence of the left intrarenal collecting system and mildly prominent left extrarenal pelvis unchanged. Electronically Signed   By: Marin Olp M.D.   On: 08/25/2019 14:42   CT ABDOMEN PELVIS W CONTRAST  Result Date: 08/24/2019 CLINICAL DATA:  Right lower quadrant pain radiating to lower back for 3 weeks, associated nausea EXAM: CT ABDOMEN AND PELVIS WITH CONTRAST TECHNIQUE: Multidetector CT imaging of the abdomen and pelvis was performed using the standard protocol following bolus administration of intravenous contrast. CONTRAST:  166mL OMNIPAQUE IOHEXOL 300 MG/ML  SOLN COMPARISON:  Renal ultrasound 11/30/2014, abdominal ultrasound 04/16/1998 (report only), CT abdomen 04/13/1998 (report only) FINDINGS: Lower chest: Consolidative opacity seen in the superior segment left lower lobe only partially imaged on this exam. Trace bilateral pleural effusions with adjacent passive atelectasis. Additional bandlike areas of subsegmental atelectasis or scarring bilaterally. Mild cardiomegaly with predominantly biatrial enlargement. Dense mitral annular calcifications are noted. Additional calcifications present on the aortic leaflets. Three-vessel coronary artery disease is noted. Distal thoracic aortic atherosclerosis as well. Hepatobiliary: Multiple hypoattenuating subcentimeter  foci throughout the liver too small to fully characterize on CT imaging but statistically likely benign. A larger fluid attenuation probable hepatic cyst seen in the right lobe measuring up to 3.6 cm in size. No concerning focal liver abnormality. Smooth surface contour. Normal liver attenuation. Gallbladder contains a partially calcified gallstone towards the fundus. No pericholecystic fluid or inflammation. No biliary ductal dilatation or calcified intraductal gallstones are seen. Pancreas: Cystic focus seen at the pancreatic head measuring 11 mm in size (3/32). Additional cystic lesion in the pancreatic tail measuring up to 12 mm in size (3/22) partial fatty replacement of the pancreas. No ductal dilatation. No peripancreatic inflammation. Spleen: Prior splenectomy with some residual splenic tissue along the operative bed and in the left upper quadrant. Adrenals/Urinary Tract: Normal adrenal glands. No visible or concerning renal lesions. Bilateral extrarenal pelves with at most mild hydronephrosis but with normal caliber change by the level of the ureteropelvic junctions. No visible obstructing calculus other urolithiasis. Urinary bladder is partially decompressed no gross bladder abnormality. Stomach/Bowel: Distal esophagus, stomach and duodenal sweep are unremarkable. No small bowel wall thickening or dilatation. No evidence of obstruction. A normal appendix is visualized. No colonic dilatation or wall thickening. Scattered colonic diverticula without focal inflammation to suggest diverticulitis. Vascular/Lymphatic: Atherosclerotic calcifications throughout the abdominal aorta and Sonjia Wilcoxson vessels. No aneurysm or ectasia. No enlarged abdominopelvic lymph nodes. Reproductive: Uterus is surgically absent. No concerning adnexal lesions. Question appearance of the left ovary. Right ovary  is poorly visualized, possibly obscured by streak artifact. Other: No abdominopelvic free fluid or free gas. No bowel containing  hernias. Small fat containing umbilical hernia and right para umbilical hernia. Musculoskeletal: Exaggerated lumbar lordosis. Anterolisthesis of L4 on 5 without associated spondylolysis. Multilevel degenerative changes are present in the imaged portions of the spine. Interspinous arthrosis compatible with Baastrup's disease. Additional degenerative changes in the pelvis including severe arthrosis of the left hip and prior right hip arthroplasty with heterotopic ossification. No acute or worrisome osseous lesions. IMPRESSION: 1. Consolidative opacity in the superior segment left lower lobe only partially imaged on this exam. Could reflect atelectasis or pneumonia. Correlate with clinical findings. 2. Trace bilateral pleural effusions with adjacent passive atelectasis. 3. No acute abdominopelvic abnormality to provide cause for patient's symptoms. 4. Bilateral extrarenal pelves with at most mild hydronephrosis but with normal caliber change by the level of the ureteropelvic junctions. Findings could reflect chronic UPJ obstruction particularly given the absence of visible urolithiasis or other acute urinary tract abnormality. 5. Cholelithiasis without evidence of acute cholecystitis. 6. Cystic lesions in the pancreatic head and tail measuring up to 11 mm in size. These could reflect pseudocysts or cystic pancreatic neoplasms. In the absence of acute biliary symptoms, consider reimaging with dedicated contrast enhanced MRI or pancreatic protocol CT at 2 years. This recommendation follows ACR consensus guidelines: Management of Incidental Pancreatic Cysts: A White Paper of the ACR Incidental Findings Committee. J Am Coll Radiol 8119;[id number]. 7. Colonic diverticulosis without evidence of diverticulitis. 8. Severe left hip arthrosis. 9. Aortic Atherosclerosis (ICD10-I70.0). Electronically Signed   By: Lovena Le M.D.   On: 08/24/2019 01:06   DG CHEST PORT 1 VIEW  Result Date: 08/25/2019 CLINICAL DATA:  abnormal  chest x-ray EXAM: PORTABLE CHEST 1 VIEW COMPARISON:  August 23, 2019 FINDINGS: The aorta is tortuous. The heart size is enlarged. Increased opacity is identified in the right perihilar region. Increased pulmonary interstitium is identified bilaterally. There is no pleural effusion. The bony structures are stable. IMPRESSION: 1. Probable congestive heart failure. 2. Increased opacity identified in the right perihilar region, recommend further evaluation with chest CT to exclude underlying mass. Electronically Signed   By: Abelardo Diesel M.D.   On: 08/25/2019 07:45   DG Chest Port 1 View  Result Date: 08/23/2019 CLINICAL DATA:  Weakness EXAM: PORTABLE CHEST 1 VIEW COMPARISON:  12/01/2014 FINDINGS: Linear scarring or atelectasis in the right mid lung. Mild cardiomegaly with central vascular congestion. Linear atelectasis or scar in the left base. No consolidation or effusion. Aortic atherosclerosis. No pneumothorax. IMPRESSION: Cardiomegaly with mild central vascular congestion. Electronically Signed   By: Donavan Foil M.D.   On: 08/23/2019 23:01      Assessment and Plan:   1. Persistent afib - followed at [facility], from last few outpatient notes has had troubles with elevated rates. Patient had turned down prior cardioversion/rhythm strategy according to notes but family/patient do not recall that discussion. Diagnosed 03/2019 with a fib, monitors have shown afib since that time, has not had attempted cardioversion -ongoing issus with rate control as outpatient, this is not new just during this admission - she is currently on diltiazem 30mg  every 6 hours, toprol 50mg  bid. Has been on eliquis for stroke prevention  - would try increasing toprol to 75mg  bid, continue dilt as prescribed.  - if fails rate control based on bp's would consider attempt at [place]. She did miss a dose of her anticoag, so would require TEE/DCCV.  - I think a  big part of her hypotension yesterday was hypovolemia based on her  labs, hopefully with diuretics on hold will tolerate high av nodal agent doses.    2. Pneumonia  - per primary team  3. AKI - likely prerenal from diuretics, no further diuresis  4. Chronic diastolic HF - hold diuretics given AKI, soft bp's yesterday      For questions or updates, please contact [facility] Please consult [email or web address] for contact info under    Signed, Carlyle Dolly, MD  08/26/2019 2:26 PM

## 2019-08-26 NOTE — Progress Notes (Signed)
Pt. BP was 81/51 MD was page and order a manual BP 89/60. 250 ml bolus was order. BP after bolus was 98/60. Will continue to monitor patient.

## 2019-08-27 DIAGNOSIS — I5032 Chronic diastolic (congestive) heart failure: Secondary | ICD-10-CM

## 2019-08-27 LAB — CBC WITH DIFFERENTIAL/PLATELET
Abs Immature Granulocytes: 0.02 10*3/uL (ref 0.00–0.07)
Basophils Absolute: 0.1 10*3/uL (ref 0.0–0.1)
Basophils Relative: 1 %
Eosinophils Absolute: 0.4 10*3/uL (ref 0.0–0.5)
Eosinophils Relative: 6 %
HCT: 36.3 % (ref 36.0–46.0)
Hemoglobin: 11.9 g/dL — ABNORMAL LOW (ref 12.0–15.0)
Immature Granulocytes: 0 %
Lymphocytes Relative: 36 %
Lymphs Abs: 2.4 10*3/uL (ref 0.7–4.0)
MCH: 30.7 pg (ref 26.0–34.0)
MCHC: 32.8 g/dL (ref 30.0–36.0)
MCV: 93.8 fL (ref 80.0–100.0)
Monocytes Absolute: 0.8 10*3/uL (ref 0.1–1.0)
Monocytes Relative: 12 %
Neutro Abs: 2.9 10*3/uL (ref 1.7–7.7)
Neutrophils Relative %: 45 %
Platelets: 273 10*3/uL (ref 150–400)
RBC: 3.87 MIL/uL (ref 3.87–5.11)
RDW: 14.6 % (ref 11.5–15.5)
WBC: 6.6 10*3/uL (ref 4.0–10.5)
nRBC: 0 % (ref 0.0–0.2)

## 2019-08-27 LAB — COMPREHENSIVE METABOLIC PANEL
ALT: 14 U/L (ref 0–44)
AST: 14 U/L — ABNORMAL LOW (ref 15–41)
Albumin: 2.7 g/dL — ABNORMAL LOW (ref 3.5–5.0)
Alkaline Phosphatase: 53 U/L (ref 38–126)
Anion gap: 11 (ref 5–15)
BUN: 17 mg/dL (ref 8–23)
CO2: 26 mmol/L (ref 22–32)
Calcium: 8.6 mg/dL — ABNORMAL LOW (ref 8.9–10.3)
Chloride: 98 mmol/L (ref 98–111)
Creatinine, Ser: 0.85 mg/dL (ref 0.44–1.00)
GFR calc Af Amer: >60 mL/min (ref >60)
GFR calc non Af Amer: >60 mL/min (ref >60)
Glucose, Bld: 92 mg/dL (ref 70–99)
Potassium: 3.9 mmol/L (ref 3.5–5.1)
Sodium: 135 mmol/L (ref 135–145)
Total Bilirubin: 0.3 mg/dL (ref 0.3–1.2)
Total Protein: 6.2 g/dL — ABNORMAL LOW (ref 6.5–8.1)

## 2019-08-27 LAB — PHOSPHORUS: Phosphorus: 3.3 mg/dL (ref 2.5–4.6)

## 2019-08-27 LAB — MAGNESIUM: Magnesium: 1.9 mg/dL (ref 1.7–2.4)

## 2019-08-27 MED ORDER — AMOXICILLIN 500 MG PO CAPS
1000.0000 mg | ORAL_CAPSULE | Freq: Three times a day (TID) | ORAL | 0 refills | Status: AC
Start: 2019-08-27 — End: 2019-08-30

## 2019-08-27 MED ORDER — DICLOFENAC SODIUM 1 % EX GEL: 2.0000 g | Freq: Four times a day (QID) | CUTANEOUS | 0 refills | Status: AC | PRN

## 2019-08-27 MED ORDER — METOPROLOL SUCCINATE ER 25 MG PO TB24: 75.0000 mg | ORAL_TABLET | Freq: Two times a day (BID) | ORAL | 0 refills | Status: DC

## 2019-08-27 MED ORDER — AZITHROMYCIN 500 MG PO TABS: 500.0000 mg | ORAL_TABLET | Freq: Every day | ORAL | 0 refills | Status: AC

## 2019-08-27 NOTE — Discharge Summary (Signed)
Physician Discharge Summary  [name] NLZ:[id number] DOB: 1930/03/15 DOA: 08/23/2019  PCP: Jonathon Resides, MD  Admit date: 08/23/2019 Discharge date: 08/27/2019  Time spent: 40 minutes  Recommendations for Outpatient Follow-up:  1. Follow outpatient CBC/CMP 2. Complete course of abx for pneumonia - needs CXR in 2-3 weeks 3. For afib/RVR - metop increased to 75 mg BID, continue diltiazem 4. Irbesartan - HCTZ, lasix, metolazone on hold until he follows up with Dr. Elonda Husky with cardiology - consider discontinuing thiazide as she's typically on lasix 5. Follow back pain, continue conservative management with voltaren/apap prn 6. 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 need outpatient follow up  Discharge Diagnoses:  Principal Problem:   Back pain Active Problems:   Restless leg syndrome   Essential hypertension, benign   GERD (gastroesophageal reflux disease)   Generalized weakness   Discharge Condition: stable  Diet recommendation: heart healthy  Filed Weights   08/24/19 0842 08/26/19 2046  Weight: 67.1 kg 67.1 kg   History of present illness:  [name] [name] 86 y.o.femalewith medical history significant forAfib (on Eliquis), venous stasis dermatitis, HLD, Hypothyroidism, hypertension, RLS, s/p bilateral knee replacementwho presents to the emergency department with several complaints, she complains of a4 to 5-day onset of generalized weakness, back pain and abdominal pain. Patient states that the back pain worsens with ambulation and this has resulted in limiting mobility as well as ability to care for self. Abdominal pain was epigastric and right upper quadrant, this was associated with nausea without vomiting. Patient also complained of difficulty in being able to control [name]. fib since diagnosis in February of this year, though she states that she did not take her medication (Eliquis or metoprolol) last night. She states that she has been following with Stevi Hollinshead cardiologist.  She also complained of intermittent constipation/diarrhea but she denies any diagnosis of IBS. Patient denies fever, chills, chest pain, shortness of breath  ED Course: In the emergency department, she was tachycardic, and intermittently tachypneic. Work-up in the ED showed leukocytosis, Albumin 3.2. CT abdomen and pelvis showed no acute abdominal pelvic abnormality, but showed cystic lesions in the pancreatic head and tail measuring up to 11 mm in size which could reflect pseudocysts or cystic pancreatic neoplasms. She was treated with Eliquis and Metoprolol. Hospitalist was asked to admit patient for further evaluation and management.  She was admitted with back pain and generalized malaise.  Her back pain was thought to be musculoskeletal, and she's improved with pain management.  She was found to have pneumonia and started on antibiotics for this.  She had persistent RVR.  Cardiology was consulted, her rate was much improved at the time of discharge.  She was feeling improved on day of discharge, plan for discharge home.  She declined home health.  Hospital Course:  Acute on chronic back pain Generalized weakness CT abdomen/pelvis without clear cause for her sx - ? Consolidative opacity in superior segment LLL, mild hydro (?chronic UPJ obstruction), cystic lesions in pancreatic head and tail, and severe L hip arthrosis No numbness, weakness, bowel or bladder sx  She's no longer taking norco/robaxin/tramadol, notes these were old Will start pain control with scheduled APAP, oxycodone prn, voltaren gel - suspect pain is musculoskeletal given worsening with movement -> symptoms have improved today Consider additional imaging with MRI lumbar spine if she's not improving with above treatment Follow UA for mild hydro, discussed with urology, can follow outpatient if desired PT/OT, OOB  Community Acquired Pneumonia CT notable for multifocal airspace  disease in the RUL and LLL likely  multifocal pneumonia Will start ceftriaxone/azithromycin -> discharge with azithromycin/amoxicilin  Sputum, urine strep, urine legionella She's doing well on RA, but this may contribute to generalized weakness/malaise above  [name]-Fibwith RVR(on Eliquis) RVR improved Continue metop and dilt at home Continue eliquis Cardiology c/s with BP's on the lower side and RVR -> appreciate assistance -> recommended holding irbesartan-HCTZ, increasing metoprolol to 75 mg BID  Elevated BNP  Diastolic HF: Echo from 06/6431 with EF 55-60%, indeterminate doppler parameters for diastolic function  CXR this AM with findings concerning for HF, elevated BNP CT chest with findings concerning for pneumonia Exam without overt overload, but with elevated BNP.  Hold additional diuresis at this time.  Became hypotensive with lasix 7/4.  Holding lasix until outpatient appt with cards.  AKI: likely in setting of diuresis, hold lasix  Essential hypertension Continue metoprolol.  Diltiazem.  Avalide and lasix on hold in addition to metolazone until she follows outpatient due to hypotension.   Hypothyroidism Continue Synthroid   Hyperlipidemia Continue simvastatin   GERD Continue Pepcid   Hypokalemia  Hypomagnesemia: replace and follow  Cystic Lesions of pancreatic head and tail: need outpatient follow up  Procedures:  none  Consultations:  cardiology  Discharge Exam: Vitals:   08/27/19 0550 08/27/19 0848  BP: 104/73 98/69  Pulse: 73 63  Resp:  18  Temp: 97.8 F (36.6 C) 98 F (36.7 C)  SpO2: 91% 95%   Feels better Eager to discharge Not interested in home health Discussed discharge plan with daughter  General: No acute distress. Cardiovascular: irregularly irregular, rate controlled Lungs: Clear to auscultation bilaterally  Abdomen: Soft, nontender, nondistended  Neurological: Alert and oriented 3. Moves all extremities 4 . Cranial nerves II through XII grossly  intact. Skin: Warm and dry. No rashes or lesions. Extremities: No clubbing or cyanosis. No edema. Discharge Instructions   Discharge Instructions    Call MD for:  difficulty breathing, headache or visual disturbances   Complete by: As directed    Call MD for:  extreme fatigue   Complete by: As directed    Call MD for:  hives   Complete by: As directed    Call MD for:  persistant dizziness or light-headedness   Complete by: As directed    Call MD for:  persistant nausea and vomiting   Complete by: As directed    Call MD for:  redness, tenderness, or signs of infection (pain, swelling, redness, odor or green/yellow discharge around incision site)   Complete by: As directed    Call MD for:  severe uncontrolled pain   Complete by: As directed    Call MD for:  temperature >100.4   Complete by: As directed    Diet - low sodium heart healthy   Complete by: As directed    Discharge instructions   Complete by: As directed    You were seen for pneumonia and back pain.  You've improved with pain medicine (voltaren gel and tylenol for your back pain).  You've been started on antibiotics for your pneumonia.  We'll discharge you with antibiotics to complete Logyn Dedominicis 5 day course.  Please follow up with your PCP to follow up x rays in 2-3 weeks.  You were seen by cardiology for your atrial fibrillation.  Your metoprolol was increased to 75 mg twice daily.  Continue your diltiazem.  Stop your lasix, metolazone, and avalide until you follow up with your cardiologist on Thursday and see what they  recommend to resume.   You had abnormal imaging of the pancreas that should be followed up with your PCP.  Return for new, recurrent, or worsening symptoms.  Please ask your PCP to request records from this hospitalization so they know what was done and what the next steps will be.   Increase activity slowly   Complete by: As directed      Allergies as of 08/27/2019      Reactions   Doxycycline Nausea And  Vomiting, Rash   Significant enough to d/c treatment and refuse to take more Other reaction(s): Vomiting (intolerance) The first time she took it she had N/V and the second she had Jamonte Curfman rash.    Amlodipine    angioedema   Arthrotec [diclofenac-misoprostol] Diarrhea   Shortness of breath   Celebrex [celecoxib] Other (See Comments)   GI Bleeding   Shellfish Allergy    Itchy rash   Vancomycin    Acute kidney failure   Diclofenac Diarrhea   Latex Rash      Medication List    STOP taking these medications   furosemide 20 MG tablet Commonly known as: LASIX   HYDROcodone-acetaminophen 5-325 MG tablet Commonly known as: NORCO/VICODIN   irbesartan-hydrochlorothiazide 300-12.5 MG tablet Commonly known as: AVALIDE   methocarbamol 500 MG tablet Commonly known as: ROBAXIN   metolazone 2.5 MG tablet Commonly known as: ZAROXOLYN   traMADol 50 MG tablet Commonly known as: ULTRAM     TAKE these medications   acetaminophen 500 MG tablet Commonly known as: TYLENOL Take 1,000 mg by mouth 2 (two) times daily as needed for mild pain or headache.   amoxicillin 500 MG capsule Commonly known as: AMOXIL Take 2 capsules (1,000 mg total) by mouth 3 (three) times daily for 3 days.   azithromycin 500 MG tablet Commonly known as: ZITHROMAX Take 1 tablet (500 mg total) by mouth daily for 2 days. Start taking on: August 28, 2019   brimonidine 0.2 % ophthalmic solution Commonly known as: ALPHAGAN Place 1 drop into both eyes 2 (two) times daily.   diclofenac Sodium 1 % Gel Commonly known as: VOLTAREN Apply 2 g topically 4 (four) times daily as needed.   diltiazem 180 MG 24 hr capsule Commonly known as: TIAZAC Take 180 mg by mouth daily.   Eliquis 5 MG Tabs tablet Generic drug: apixaban Take 5 mg by mouth 2 (two) times daily.   esomeprazole 20 MG capsule Commonly known as: NEXIUM Take 20 mg by mouth in the morning.   famotidine 40 MG tablet Commonly known as: PEPCID Take 40 mg by  mouth at bedtime.   ferrous sulfate 325 (65 FE) MG tablet Take 325 mg by mouth 2 (two) times daily.   latanoprost 0.005 % ophthalmic solution Commonly known as: XALATAN Place 1 drop into both eyes at bedtime.   levothyroxine 88 MCG tablet Commonly known as: SYNTHROID Take 88 mcg by mouth daily before breakfast.   metFORMIN 500 MG tablet Commonly known as: GLUCOPHAGE Take 500 mg by mouth at bedtime.   metoprolol succinate 25 MG 24 hr tablet Commonly known as: TOPROL-XL Take 3 tablets (75 mg total) by mouth 2 (two) times daily. What changed:   medication strength  how much to take  additional instructions   pramipexole 1 MG tablet Commonly known as: MIRAPEX Take 1 mg by mouth daily as needed (Restless legs).   simvastatin 20 MG tablet Commonly known as: ZOCOR Take 20 mg by mouth every evening.   Systane 0.4-0.3 %  Gel ophthalmic gel Generic drug: Polyethyl Glycol-Propyl Glycol Place 1 application into both eyes daily as needed (Dry eyes).   timolol 0.25 % ophthalmic solution Commonly known as: BETIMOL Place 1 drop into both eyes 2 (two) times daily.   triamcinolone ointment 0.1 % Commonly known as: KENALOG Apply 1 application topically 2 (two) times daily as needed (skin irritation.).   vitamin C 500 MG tablet Commonly known as: ASCORBIC ACID Take 500 mg by mouth 2 (two) times daily.   Vitamin D3 50 MCG (2000 UT) Tabs Take 2,000 Units by mouth daily.      Allergies  Allergen Reactions  . Doxycycline Nausea And Vomiting and Rash    Significant enough to d/c treatment and refuse to take more Other reaction(s): Vomiting (intolerance) The first time she took it she had N/V and the second she had Britiany Silbernagel rash.    . Amlodipine     angioedema  . Arthrotec [Diclofenac-Misoprostol] Diarrhea    Shortness of breath  . Celebrex [Celecoxib] Other (See Comments)    GI Bleeding  . Shellfish Allergy     Itchy rash  . Vancomycin     Acute kidney failure  . Diclofenac  Diarrhea  . Latex Rash      The results of significant diagnostics from this hospitalization (including imaging, microbiology, ancillary and laboratory) are listed below for reference.    Significant Diagnostic Studies: CT CHEST W CONTRAST  Result Date: 08/25/2019 CLINICAL DATA:  Possible right perihilar abnormality on recent chest x-ray. Evaluate for underlying mass or adenopathy. EXAM: CT CHEST WITH CONTRAST TECHNIQUE: Multidetector CT imaging of the chest was performed during intravenous contrast administration. CONTRAST:  67mL OMNIPAQUE IOHEXOL 300 MG/ML  SOLN COMPARISON:  Chest x-ray today, CT abdomen 08/24/2019 FINDINGS: Cardiovascular: Mild cardiomegaly. Mild calcified plaque over the left anterior descending and lateral circumflex coronary arteries as well as right coronary arteries. Thoracic aorta is normal in caliber without aneurysm or dissection. Pulmonary arterial system is unremarkable. Mediastinum/Nodes: No mediastinal or hilar adenopathy. Remaining mediastinal structures are unremarkable. Lungs/Pleura: Lungs are adequately inflated demonstrate patchy airspace consolidation over the posterior right upper lobe and left lower lobe likely multifocal infection. Tiny amount left pleural fluid with associated basilar atelectasis. Airways are normal. Upper Abdomen: Several liver cysts are present. There are Deni Lefever couple small hypodensities over the pancreas unchanged and better evaluated on the abdominal CT scan. Mild prominence of the left intrarenal collecting system with prominent extrarenal pelvis without significant change. Calcified plaque over the abdominal aorta. Musculoskeletal: Degenerative change of the spine. IMPRESSION: 1. Multifocal airspace process involving the right upper lobe and left lower lobe likely multifocal pneumonia. Tiny amount left pleural fluid with associated basilar atelectasis. 2. Aortic Atherosclerosis (ICD10-I70.0). Atherosclerotic coronary artery disease. 3. Stable  liver cysts and stable small pancreatic cystic lesions better evaluated on recent abdominal CT. See recommendations as recommend follow-up CT 2 years. 4. Mild prominence of the left intrarenal collecting system and mildly prominent left extrarenal pelvis unchanged. Electronically Signed   By: Marin Olp M.D.   On: 08/25/2019 14:42   CT ABDOMEN PELVIS W CONTRAST  Result Date: 08/24/2019 CLINICAL DATA:  Right lower quadrant pain radiating to lower back for 3 weeks, associated nausea EXAM: CT ABDOMEN AND PELVIS WITH CONTRAST TECHNIQUE: Multidetector CT imaging of the abdomen and pelvis was performed using the standard protocol following bolus administration of intravenous contrast. CONTRAST:  126mL OMNIPAQUE IOHEXOL 300 MG/ML  SOLN COMPARISON:  Renal ultrasound 11/30/2014, abdominal ultrasound 04/16/1998 (report only), CT abdomen  04/13/1998 (report only) FINDINGS: Lower chest: Consolidative opacity seen in the superior segment left lower lobe only partially imaged on this exam. Trace bilateral pleural effusions with adjacent passive atelectasis. Additional bandlike areas of subsegmental atelectasis or scarring bilaterally. Mild cardiomegaly with predominantly biatrial enlargement. Dense mitral annular calcifications are noted. Additional calcifications present on the aortic leaflets. Three-vessel coronary artery disease is noted. Distal thoracic aortic atherosclerosis as well. Hepatobiliary: Multiple hypoattenuating subcentimeter foci throughout the liver too small to fully characterize on CT imaging but statistically likely benign. Damyiah Moxley larger fluid attenuation probable hepatic cyst seen in the right lobe measuring up to 3.6 cm in size. No concerning focal liver abnormality. Smooth surface contour. Normal liver attenuation. Gallbladder contains Rainelle Sulewski partially calcified gallstone towards the fundus. No pericholecystic fluid or inflammation. No biliary ductal dilatation or calcified intraductal gallstones are seen.  Pancreas: Cystic focus seen at the pancreatic head measuring 11 mm in size (3/32). Additional cystic lesion in the pancreatic tail measuring up to 12 mm in size (3/22) partial fatty replacement of the pancreas. No ductal dilatation. No peripancreatic inflammation. Spleen: Prior splenectomy with some residual splenic tissue along the operative bed and in the left upper quadrant. Adrenals/Urinary Tract: Normal adrenal glands. No visible or concerning renal lesions. Bilateral extrarenal pelves with at most mild hydronephrosis but with normal caliber change by the level of the ureteropelvic junctions. No visible obstructing calculus other urolithiasis. Urinary bladder is partially decompressed no gross bladder abnormality. Stomach/Bowel: Distal esophagus, stomach and duodenal sweep are unremarkable. No small bowel wall thickening or dilatation. No evidence of obstruction. Kwynn Schlotter normal appendix is visualized. No colonic dilatation or wall thickening. Scattered colonic diverticula without focal inflammation to suggest diverticulitis. Vascular/Lymphatic: Atherosclerotic calcifications throughout the abdominal aorta and branch vessels. No aneurysm or ectasia. No enlarged abdominopelvic lymph nodes. Reproductive: Uterus is surgically absent. No concerning adnexal lesions. Question appearance of the left ovary. Right ovary is poorly visualized, possibly obscured by streak artifact. Other: No abdominopelvic free fluid or free gas. No bowel containing hernias. Small fat containing umbilical hernia and right para umbilical hernia. Musculoskeletal: Exaggerated lumbar lordosis. Anterolisthesis of L4 on 5 without associated spondylolysis. Multilevel degenerative changes are present in the imaged portions of the spine. Interspinous arthrosis compatible with Baastrup's disease. Additional degenerative changes in the pelvis including severe arthrosis of the left hip and prior right hip arthroplasty with heterotopic ossification. No acute  or worrisome osseous lesions. IMPRESSION: 1. Consolidative opacity in the superior segment left lower lobe only partially imaged on this exam. Could reflect atelectasis or pneumonia. Correlate with clinical findings. 2. Trace bilateral pleural effusions with adjacent passive atelectasis. 3. No acute abdominopelvic abnormality to provide cause for patient's symptoms. 4. Bilateral extrarenal pelves with at most mild hydronephrosis but with normal caliber change by the level of the ureteropelvic junctions. Findings could reflect chronic UPJ obstruction particularly given the absence of visible urolithiasis or other acute urinary tract abnormality. 5. Cholelithiasis without evidence of acute cholecystitis. 6. Cystic lesions in the pancreatic head and tail measuring up to 11 mm in size. These could reflect pseudocysts or cystic pancreatic neoplasms. In the absence of acute biliary symptoms, consider reimaging with dedicated contrast enhanced MRI or pancreatic protocol CT at 2 years. This recommendation follows ACR consensus guidelines: Management of Incidental Pancreatic Cysts: Lauris Serviss White Paper of the ACR Incidental Findings Committee. J Am Coll Radiol 9628;36:629-476. 7. Colonic diverticulosis without evidence of diverticulitis. 8. Severe left hip arthrosis. 9. Aortic Atherosclerosis (ICD10-I70.0). Electronically Signed   By: March Rummage  [facility] M.D.   On: 08/24/2019 01:06   DG CHEST PORT 1 VIEW  Result Date: 08/25/2019 CLINICAL DATA:  abnormal chest x-ray EXAM: PORTABLE CHEST 1 VIEW COMPARISON:  August 23, 2019 FINDINGS: The aorta is tortuous. The heart size is enlarged. Increased opacity is identified in the right perihilar region. Increased pulmonary interstitium is identified bilaterally. There is no pleural effusion. The bony structures are stable. IMPRESSION: 1. Probable congestive heart failure. 2. Increased opacity identified in the right perihilar region, recommend further evaluation with chest CT to exclude underlying  mass. Electronically Signed   By: Abelardo Diesel M.D.   On: 08/25/2019 07:45   DG Chest Port 1 View  Result Date: 08/23/2019 CLINICAL DATA:  Weakness EXAM: PORTABLE CHEST 1 VIEW COMPARISON:  12/01/2014 FINDINGS: Linear scarring or atelectasis in the right mid lung. Mild cardiomegaly with central vascular congestion. Linear atelectasis or scar in the left base. No consolidation or effusion. Aortic atherosclerosis. No pneumothorax. IMPRESSION: Cardiomegaly with mild central vascular congestion. Electronically Signed   By: Donavan Foil M.D.   On: 08/23/2019 23:01    Microbiology: Recent Results (from the past 240 hour(s))  SARS Coronavirus 2 by RT PCR (hospital order, performed in [facility] hospital lab) Nasopharyngeal Nasopharyngeal Swab     Status: None   Collection Time: 08/24/19  5:48 AM   Specimen: Nasopharyngeal Swab  Result Value Ref Range Status   SARS Coronavirus 2 NEGATIVE NEGATIVE Final    Comment: (NOTE) SARS-CoV-2 target nucleic acids are NOT DETECTED.  The SARS-CoV-2 RNA is generally detectable in upper and lower respiratory specimens during the acute phase of infection. The lowest concentration of SARS-CoV-2 viral copies this assay can detect is 250 copies / mL. Ifeanyi Mickelson negative result does not preclude SARS-CoV-2 infection and should not be used as the sole basis for treatment or other patient management decisions.  Terrianna Holsclaw negative result may occur with improper specimen collection / handling, submission of specimen other than nasopharyngeal swab, presence of viral mutation(s) within the areas targeted by this assay, and inadequate number of viral copies (<250 copies / mL). Dierks Wach negative result must be combined with clinical observations, patient history, and epidemiological information.  Fact Sheet for Patients:   StrictlyIdeas.no  Fact Sheet for Healthcare Providers: BankingDealers.co.za  This test is not yet approved or  cleared by  the [place] FDA and has been authorized for detection and/or diagnosis of SARS-CoV-2 by FDA under an Emergency Use Authorization (EUA).  This EUA will remain in effect (meaning this test can be used) for the duration of the COVID-19 declaration under Section 564(b)(1) of the Act, 21 U.S.C. section 360bbb-3(b)(1), unless the authorization is terminated or revoked sooner.  Performed at [facility], [place] [address] [place], [place] [address]      Labs: Basic Metabolic Panel: Recent Labs  Lab 08/23/19 2240 08/24/19 0512 08/25/19 0408 08/26/19 0337 08/27/19 0650  NA 136 135 136 138 135  K 3.6 3.1* 4.2 3.9 3.9  CL 102 102 105 101 98  CO2 23 22 23 26 26   GLUCOSE 108* 94 117* 121* 92  BUN 15 13 18  24* 17  CREATININE 0.83 0.79 0.96 1.17* 0.85  CALCIUM 9.0 8.7* 8.5* 8.4* 8.6*  MG  --  1.3* 1.5* 1.5* 1.9  PHOS  --  3.0 3.1 4.0 3.3   Liver Function Tests: Recent Labs  Lab 08/23/19 2240 08/24/19 0512 08/25/19 0408 08/26/19 0337 08/27/19 0650  AST 25 20 17 16  14*  ALT 20 18  15 15 14   ALKPHOS 67 59 54 56 53  BILITOT 1.2 1.3* 0.8 0.4 0.3  PROT 7.0 6.2* 6.0* 6.4* 6.2*  ALBUMIN 3.2* 2.9* 2.6* 2.5* 2.7*   Recent Labs  Lab 08/23/19 2240  LIPASE 20   No results for input(s): AMMONIA in the last 168 hours. CBC: Recent Labs  Lab 08/23/19 2240 08/24/19 0512 08/25/19 0408 08/26/19 0337 08/27/19 0650  WBC 13.3* 13.0* 9.2 7.1 6.6  NEUTROABS 10.6*  --  5.1 3.3 2.9  HGB 12.7 11.5* 11.3* 12.3 11.9*  HCT 39.6 35.0* 35.1* 37.3 36.3  MCV 94.7 93.3 93.4 94.0 93.8  PLT 220 225 214 250 273   Cardiac Enzymes: No results for input(s): CKTOTAL, CKMB, CKMBINDEX, TROPONINI in the last 168 hours. BNP: BNP (last 3 results) Recent Labs    08/25/19 0824  BNP 644.1*    ProBNP (last 3 results) No results for input(s): PROBNP in the last 8760 hours.  CBG: No results for input(s): GLUCAP in the last 168 hours.     Signed:  Fayrene Helper MD.  [facility] 08/27/2019, 12:33 PM

## 2019-08-27 NOTE — Plan of Care (Signed)
  Problem: Clinical Measurements: Goal: Ability to maintain clinical measurements within normal limits will improve Outcome: Progressing   Problem: Clinical Measurements: Goal: Cardiovascular complication will be avoided Outcome: Progressing   

## 2019-08-27 NOTE — Progress Notes (Signed)
Physical Therapy Treatment Patient Details Name: Dana Bradford MRN: [id number] DOB: [birth year] Today's Date: 08/27/2019    History of Present Illness Pt is an 86 y/o female admitted secondary to back pain with no known mechanism of injury. Per MD, back pain is chronic despite pt denying this upon evaluation. PMH including but not limited to Afib (on Eliquis), venous stasis dermatitis, HLD, Hypothyroidism, hypertension, RLS, s/p bilateral knee replacement.    PT Comments    Continuing work on functional mobility and activity tolerance;  Pt tells me her back feels much better, little to no pain; She is looking forward to getting home; much improved activity tolerance, walked 80 ft with RW and Supervision; OK for dc home from PT standpoint    Follow Up Recommendations  Home health PT;Supervision/Assistance - 24 hour;Other (comment) (24/7 initially)     Equipment Recommendations  None recommended by PT    Recommendations for Other Services       Precautions / Restrictions Precautions Precautions: Fall Precaution Comments: fall risk reduced with use of RW    Mobility  Bed Mobility                  Transfers Overall transfer level: Needs assistance Equipment used: Rolling walker (2 wheeled) Transfers: Sit to/from Stand Sit to Stand: Supervision         General transfer comment: for safety  Ambulation/Gait Ambulation/Gait assistance: Supervision Gait Distance (Feet): 80 Feet Assistive device: Rolling walker (2 wheeled) Gait Pattern/deviations: Trunk flexed;Shuffle;Step-through pattern;Decreased step length - right;Decreased step length - left;Decreased stride length Gait velocity: decreased   General Gait Details: Trunk flexed posture throughout walk today; sow, but steady steps   Stairs         General stair comments: Politely declined practicing stairs   Wheelchair Mobility    Modified Rankin (Stroke Patients Only)       Balance     Sitting  balance-Leahy Scale: Good       Standing balance-Leahy Scale: Fair                              Cognition Arousal/Alertness: Awake/alert Behavior During Therapy: WFL for tasks assessed/performed Overall Cognitive Status: Within Functional Limits for tasks assessed                                 General Comments: not formally assessed but [facility] for general conversation      Exercises      General Comments        Pertinent Vitals/Pain Pain Assessment: Faces Faces Pain Scale: Hurts a little bit Pain Location: low back Pain Descriptors / Indicators: Sore Pain Intervention(s): Monitored during session    Home Living                      Prior Function            PT Goals (current goals can now be found in the care plan section) Acute Rehab PT Goals Patient Stated Goal: "go home" PT Goal Formulation: With patient Time For Goal Achievement: 09/07/19 Potential to Achieve Goals: Good Progress towards PT goals: Progressing toward goals    Frequency    Min 3X/week      PT Plan Current plan remains appropriate    Co-evaluation              AM-PAC PT "  6 Clicks" Mobility   Outcome Measure  Help needed turning from your back to your side while in a flat bed without using bedrails?: None Help needed moving from lying on your back to sitting on the side of a flat bed without using bedrails?: None Help needed moving to and from a bed to a chair (including a wheelchair)?: None Help needed standing up from a chair using your arms (e.g., wheelchair or bedside chair)?: None Help needed to walk in hospital room?: None Help needed climbing 3-5 steps with a railing? : A Little 6 Click Score: 23    End of Session Equipment Utilized During Treatment: Gait belt Activity Tolerance: Patient tolerated treatment well Patient left: in chair;with call bell/phone within reach Nurse Communication: Mobility status PT Visit Diagnosis: Other  abnormalities of gait and mobility (R26.89);Pain Pain - part of body:  (back)     Time: 5146-0479 PT Time Calculation (min) (ACUTE ONLY): 21 min  Charges:  $Gait Training: 8-22 mins                     Roney Marion, [place]  Acute Rehabilitation Services Pager [phone] Office [place] 08/27/2019, 1:58 PM

## 2019-08-27 NOTE — Progress Notes (Signed)
Dana Bradford to be discharged Home  per MD order. Discussed prescriptions and follow up appointments with the patient. Prescriptions given to patient; medication list explained in detail. Patient verbalized understanding.  Skin clean, dry and intact without evidence of skin break down, no evidence of skin tears noted. IV catheter discontinued intact. Site without signs and symptoms of complications. Dressing and pressure applied. Pt denies pain at the site currently. No complaints noted.  Patient free of lines, drains, and wounds.   An After Visit Summary (AVS) was printed and given to the patient. Patient escorted via wheelchair, and discharged home via private auto.  Shela Commons, RN

## 2019-08-27 NOTE — Progress Notes (Signed)
Progress Note  Patient Name: Dana Bradford Date of Encounter: 08/27/2019  [facility] Cardiologist: No primary care provider on file. Elonda Husky [facility])  Subjective   Feels better. No dyspnea or dizziness. Well rate controlled at rest (in 40-50s while asleep, 60-70s awake) and with light activity (max 105 bpm going to bathroom))  Inpatient Medications    Scheduled Meds: . apixaban  5 mg Oral BID  . azithromycin  500 mg Oral Daily  . brimonidine  1 drop Both Eyes BID  . cholecalciferol  2,000 Units Oral Daily  . diclofenac Sodium  2 g Topical QID  . diltiazem  30 mg Oral Q6H  . famotidine  20 mg Oral BID  . ferrous sulfate  325 mg Oral BID WC  . latanoprost  1 drop Both Eyes QHS  . levothyroxine  88 mcg Oral Q0600  . metoprolol succinate  75 mg Oral BID  . polyethylene glycol  17 g Oral BID  . pramipexole  1 mg Oral QHS  . simvastatin  20 mg Oral QPM  . timolol  1 drop Both Eyes BID   Continuous Infusions: . cefTRIAXone (ROCEPHIN)  IV 2 g (08/26/19 1709)  . magnesium sulfate bolus IVPB     PRN Meds: methocarbamol   Vital Signs    Vitals:   08/26/19 1658 08/26/19 2046 08/27/19 0550 08/27/19 0848  BP: 120/80 (!) 131/92 104/73 98/69  Pulse: 97 97 73 63  Resp: (!) 22 16  18   Temp: 98.6 F (37 C) 97.9 F (36.6 C) 97.8 F (36.6 C) 98 F (36.7 C)  TempSrc: Oral Oral Oral Oral  SpO2: 96% 96% 91% 95%  Weight:  67.1 kg    Height:        Intake/Output Summary (Last 24 hours) at 08/27/2019 1044 Last data filed at 08/27/2019 0849 Gross per 24 hour  Intake 460 ml  Output 1675 ml  Net -1215 ml   Last 3 Weights 08/26/2019 08/24/2019 01/15/2019  Weight (lbs) 147 lb 15 oz 147 lb 14.9 oz 160 lb  Weight (kg) 67.103 kg 67.1 kg 72.576 kg      Telemetry    AFib . A single 2.8" pause while asleep, otw rates around 50 while asleep, up to 105 w activity - Personally Reviewed  ECG    AFib w RVR and R axis on admission, no repeat tracing - Personally Reviewed  Physical Exam    Appears well GEN: No acute distress.   Neck: No JVD Cardiac: irregular, no murmurs, rubs, or gallops.  Respiratory: Clear to auscultation bilaterally. GI: Soft, nontender, non-distended  MS: No edema; No deformity. Neuro:  Nonfocal  Psych: Normal affect   Labs    High Sensitivity Troponin:   Recent Labs  Lab 08/23/19 2240 08/24/19 0052  TROPONINIHS 9 9      Chemistry Recent Labs  Lab 08/25/19 0408 08/26/19 0337 08/27/19 0650  NA 136 138 135  K 4.2 3.9 3.9  CL 105 101 98  CO2 23 26 26   GLUCOSE 117* 121* 92  BUN 18 24* 17  CREATININE 0.96 1.17* 0.85  CALCIUM 8.5* 8.4* 8.6*  PROT 6.0* 6.4* 6.2*  ALBUMIN 2.6* 2.5* 2.7*  AST 17 16 14*  ALT 15 15 14   ALKPHOS 54 56 53  BILITOT 0.8 0.4 0.3  GFRNONAA 54* 42* >60  GFRAA >60 49* >60  ANIONGAP 8 11 11      Hematology Recent Labs  Lab 08/25/19 0408 08/26/19 0337 08/27/19 0650  WBC 9.2 7.1  6.6  RBC 3.76* 3.97 3.87  HGB 11.3* 12.3 11.9*  HCT 35.1* 37.3 36.3  MCV 93.4 94.0 93.8  MCH 30.1 31.0 30.7  MCHC 32.2 33.0 32.8  RDW 14.6 14.7 14.6  PLT 214 250 273    BNP Recent Labs  Lab 08/25/19 0824  BNP 644.1*     DDimer No results for input(s): DDIMER in the last 168 hours.   Radiology    CT CHEST W CONTRAST  Result Date: 08/25/2019 CLINICAL DATA:  Possible right perihilar abnormality on recent chest x-ray. Evaluate for underlying mass or adenopathy. EXAM: CT CHEST WITH CONTRAST TECHNIQUE: Multidetector CT imaging of the chest was performed during intravenous contrast administration. CONTRAST:  50mL OMNIPAQUE IOHEXOL 300 MG/ML  SOLN COMPARISON:  Chest x-ray today, CT abdomen 08/24/2019 FINDINGS: Cardiovascular: Mild cardiomegaly. Mild calcified plaque over the left anterior descending and lateral circumflex coronary arteries as well as right coronary arteries. Thoracic aorta is normal in caliber without aneurysm or dissection. Pulmonary arterial system is unremarkable. Mediastinum/Nodes: No mediastinal or hilar  adenopathy. Remaining mediastinal structures are unremarkable. Lungs/Pleura: Lungs are adequately inflated demonstrate patchy airspace consolidation over the posterior right upper lobe and left lower lobe likely multifocal infection. Tiny amount left pleural fluid with associated basilar atelectasis. Airways are normal. Upper Abdomen: Several liver cysts are present. There are a couple small hypodensities over the pancreas unchanged and better evaluated on the abdominal CT scan. Mild prominence of the left intrarenal collecting system with prominent extrarenal pelvis without significant change. Calcified plaque over the abdominal aorta. Musculoskeletal: Degenerative change of the spine. IMPRESSION: 1. Multifocal airspace process involving the right upper lobe and left lower lobe likely multifocal pneumonia. Tiny amount left pleural fluid with associated basilar atelectasis. 2. Aortic Atherosclerosis (ICD10-I70.0). Atherosclerotic coronary artery disease. 3. Stable liver cysts and stable small pancreatic cystic lesions better evaluated on recent abdominal CT. See recommendations as recommend follow-up CT 2 years. 4. Mild prominence of the left intrarenal collecting system and mildly prominent left extrarenal pelvis unchanged. Electronically Signed   By: Marin Olp M.D.   On: 08/25/2019 14:42    Cardiac Studies   From [facility] note 07/26/19 were having issues with rate control as outpatient, rate up to 125. From that note diltiazem 180mg  was started. Had been on metoprolol 50mg  bid. An outpatient monitor in 06/2019 showed avg heart rate greater than 100  03/2019 echo [facility] This result has an attachment that is not available.  Normal left ventricle size. Wall thickness is normal. Systolic function is  normal. LV EF: 55-60%. Wall motion is within normal limits. No apical  thrombus. Doppler parameters are indeterminate for diastolic function.  Mitral valve structure is normal. There is mild mitral  regurgitation.  Left atrium cavity is mildly dilated.    06/2019 Holter monitor 06/2019 [facility] Event recorder demonstrates atrial fibrillation throughout the recording  Occasional PVCs are noted  Average rate is greater than 100 bpm indicating relatively poor A. fib rate control   Patient Profile     86 y.o. female with HTN, chronic diastolic HF and persistent AFib with difficult rate control, admitted with AFib w RVR in the setting of pneumonia and AKI  Assessment & Plan    BP and ventricular rate controlled on current doses of metoprolol 75 mg BID and diltiazem 30 mg Q6h (some nocturnal bradycardia). On appropriate apixaban anticoagulation. Irbesartan-HCTZ and furosemide has been on hold since admission. Ready for DC from CV standpoint. [facility] will sign off.   Medication Recommendations:   -  Metoprolol succinate 75 mg BID - Change to diltiazem CD 180 mg daily (her home dose) - Apixaban 5 mg twice daily - Keep holding irbesartan-HCTZ until office follow-up Other recommendations (labs, testing, etc):  n/a Follow up as an outpatient:  w Dr. Baxter Hire - has an appt this Thursday which I encouraged her to keep.  For questions or updates, please contact [place] Please consult [email or web address] for contact info under        Signed, Sanda Klein, MD  08/27/2019, 10:44 AM

## 2019-11-20 DIAGNOSIS — M25562 Pain in left knee: Secondary | ICD-10-CM | POA: Insufficient documentation

## 2019-11-20 HISTORY — DX: Pain in left knee: M25.562

## 2019-12-02 DIAGNOSIS — M4310 Spondylolisthesis, site unspecified: Secondary | ICD-10-CM

## 2019-12-02 DIAGNOSIS — M51369 Other intervertebral disc degeneration, lumbar region without mention of lumbar back pain or lower extremity pain: Secondary | ICD-10-CM

## 2019-12-02 DIAGNOSIS — M5136 Other intervertebral disc degeneration, lumbar region: Secondary | ICD-10-CM | POA: Insufficient documentation

## 2019-12-02 DIAGNOSIS — M419 Scoliosis, unspecified: Secondary | ICD-10-CM | POA: Insufficient documentation

## 2019-12-02 HISTORY — DX: Scoliosis, unspecified: M41.9

## 2019-12-02 HISTORY — DX: Other intervertebral disc degeneration, lumbar region without mention of lumbar back pain or lower extremity pain: M51.369

## 2019-12-02 HISTORY — DX: Spondylolisthesis, site unspecified: M43.10

## 2020-01-14 ENCOUNTER — Encounter (HOSPITAL_COMMUNITY): Payer: Self-pay

## 2020-01-14 NOTE — Patient Instructions (Addendum)
DUE TO COVID-19 ONLY ONE VISITOR IS ALLOWED TO COME WITH YOU AND STAY IN THE WAITING ROOM ONLY DURING PRE OP AND PROCEDURE.   IF YOU WILL BE ADMITTED INTO THE HOSPITAL YOU ARE ALLOWED ONE SUPPORT PERSON DURING VISITATION HOURS ONLY (10AM -8PM)   . The support person may change daily. . The support person must pass our screening, gel in and out, and wear a mask at all times, including in the patient's room. . Patients must also wear a mask when staff or their support person are in the room.   COVID SWAB TESTING MUST BE COMPLETED ON:   Saturday, 01-18-20 @ 11:55 AM   [address] [place], [place] [address]  (Must self quarantine after testing. Follow instructions on handout.)   Your procedure is scheduled on:  Wednesday, 01-22-20   Report to [facility] Main  Entrance   Report to admitting at 1:50 PM   Call this number if you have problems the morning of surgery [phone]   Do not eat food :After Midnight.   May have liquids until 1:00 PM day of surgery  CLEAR LIQUID DIET  Foods Allowed                                                                     Foods Excluded  Water, Black Coffee and tea, regular and decaf               liquids that you cannot  Plain Jell-O in any flavor  (No red)                                     see through such as: Fruit ices (not with fruit pulp)                                      milk, soups, orange juice              Iced Popsicles (No red)                                      All solid food                                   Apple juices Sports drinks like Gatorade (No red) Lightly seasoned clear broth or consume(fat free) Sugar, honey syrup    Complete one Ensure drink the morning of surgery at  1:00 PM the day of surgery.     Oral Hygiene is also important to reduce your risk of infection.                                    Remember - BRUSH YOUR TEETH THE MORNING OF SURGERY WITH YOUR REGULAR TOOTHPASTE   Do NOT smoke after  Midnight   Take these medicines the morning of surgery  with A SIP OF WATER:  Diltiazem, Synthroid, Metoprolol, Omeprazole    WHAT DO I DO ABOUT MY DIABETES MEDICATION?  Marland Kitchen Do not take oral diabetes medicines (pills) the morning of surgery.  . THE DAY BEFORE SURGERY:  Take Metformin as prescribed.       . THE MORNING OF SURGERY:  Do not take Metformin.   Reviewed and Endorsed by [facility] Patient Education Committee, August 2015                                You may not have any metal on your body including hair pins, jewelry, and body piercings             Do not wear make-up, lotions, powders, perfumes/cologne, or deodorant             Do not wear nail polish.  Do not shave  48 hours prior to surgery.    Do not bring valuables to the hospital. [place].   Contacts, dentures or bridgework may not be worn into surgery.   Bring small overnight bag day of surgery.                Please read over the following fact sheets you were given: IF YOU HAVE QUESTIONS ABOUT YOUR PRE OP INSTRUCTIONS PLEASE CALL [phone]   [place] - Preparing for Surgery Before surgery, you can play an important role.  Because skin is not sterile, your skin needs to be as free of germs as possible.  You can reduce the number of germs on your skin by washing with CHG (chlorahexidine gluconate) soap before surgery.  CHG is an antiseptic cleaner which kills germs and bonds with the skin to continue killing germs even after washing. Please DO NOT use if you have an allergy to CHG or antibacterial soaps.  If your skin becomes reddened/irritated stop using the CHG and inform your nurse when you arrive at Short Stay. Do not shave (including legs and underarms) for at least 48 hours prior to the first CHG shower.  You may shave your face/neck.  Please follow these instructions carefully:  1.  Shower with CHG Soap the night before surgery and the  morning of  surgery.  2.  If you choose to wash your hair, wash your hair first as usual with your normal  shampoo.  3.  After you shampoo, rinse your hair and body thoroughly to remove the shampoo.                             4.  Use CHG as you would any other liquid soap.  You can apply chg directly to the skin and wash.  Gently with a scrungie or clean washcloth.  5.  Apply the CHG Soap to your body ONLY FROM THE NECK DOWN.   Do   not use on face/ open                           Wound or open sores. Avoid contact with eyes, ears mouth and   genitals (private parts).                       Wash face,  Genitals (private parts) with your normal soap.  6.  Wash thoroughly, paying special attention to the area where your    surgery  will be performed.  7.  Thoroughly rinse your body with warm water from the neck down.  8.  DO NOT shower/wash with your normal soap after using and rinsing off the CHG Soap.                9.  Pat yourself dry with a clean towel.            10.  Wear clean pajamas.            11.  Place clean sheets on your bed the night of your first shower and do not  sleep with pets. Day of Surgery : Do not apply any lotions/deodorants the morning of surgery.  Please wear clean clothes to the hospital/surgery center.  FAILURE TO FOLLOW THESE INSTRUCTIONS MAY RESULT IN THE CANCELLATION OF YOUR SURGERY  PATIENT SIGNATURE_________________________________  NURSE SIGNATURE__________________________________  ________________________________________________________________________   Adam Phenix  An incentive spirometer is a tool that can help keep your lungs clear and active. This tool measures how well you are filling your lungs with each breath. Taking long deep breaths may help reverse or decrease the chance of developing breathing (pulmonary) problems (especially infection) following:  A long period of time when you are unable to move or be active. BEFORE THE PROCEDURE    If the spirometer includes an indicator to show your best effort, your nurse or respiratory therapist will set it to a desired goal.  If possible, sit up straight or lean slightly forward. Try not to slouch.  Hold the incentive spirometer in an upright position. INSTRUCTIONS FOR USE  1. Sit on the edge of your bed if possible, or sit up as far as you can in bed or on a chair. 2. Hold the incentive spirometer in an upright position. 3. Breathe out normally. 4. Place the mouthpiece in your mouth and seal your lips tightly around it. 5. Breathe in slowly and as deeply as possible, raising the piston or the ball toward the top of the column. 6. Hold your breath for 3-5 seconds or for as long as possible. Allow the piston or ball to fall to the bottom of the column. 7. Remove the mouthpiece from your mouth and breathe out normally. 8. Rest for a few seconds and repeat Steps 1 through 7 at least 10 times every 1-2 hours when you are awake. Take your time and take a few normal breaths between deep breaths. 9. The spirometer may include an indicator to show your best effort. Use the indicator as a goal to work toward during each repetition. 10. After each set of 10 deep breaths, practice coughing to be sure your lungs are clear. If you have an incision (the cut made at the time of surgery), support your incision when coughing by placing a pillow or rolled up towels firmly against it. Once you are able to get out of bed, walk around indoors and cough well. You may stop using the incentive spirometer when instructed by your caregiver.  RISKS AND COMPLICATIONS  Take your time so you do not get dizzy or light-headed.  If you are in pain, you may need to take or ask for pain medication before doing incentive spirometry. It is harder to take a deep breath if you are having pain. AFTER USE  Rest and breathe slowly and easily.  It can be helpful to keep track of a  log of your progress. Your caregiver  can provide you with a simple table to help with this. If you are using the spirometer at home, follow these instructions: [place] IF:   You are having difficultly using the spirometer.  You have trouble using the spirometer as often as instructed.  Your pain medication is not giving enough relief while using the spirometer.  You develop fever of 100.5 F (38.1 C) or higher. SEEK IMMEDIATE MEDICAL CARE IF:   You cough up bloody sputum that had not been present before.  You develop fever of 102 F (38.9 C) or greater.  You develop worsening pain at or near the incision site. MAKE SURE YOU:   Understand these instructions.  Will watch your condition.  Will get help right away if you are not doing well or get worse. Document Released: 06/20/2006 Document Revised: 05/02/2011 Document Reviewed: 08/21/2006 ExitCare Patient Information 2014 Exit[employer], [place].   ________________________________________________________________________  WHAT IS A BLOOD TRANSFUSION? Blood Transfusion Information  A transfusion is the replacement of blood or some of its parts. Blood is made up of multiple cells which provide different functions.  Red blood cells carry oxygen and are used for blood loss replacement.  White blood cells fight against infection.  Platelets control bleeding.  Plasma helps clot blood.  Other blood products are available for specialized needs, such as hemophilia or other clotting disorders. BEFORE THE TRANSFUSION  Who gives blood for transfusions?   Healthy volunteers who are fully evaluated to make sure their blood is safe. This is blood bank blood. Transfusion therapy is the safest it has ever been in the practice of medicine. Before blood is taken from a donor, a complete history is taken to make sure that person has no history of diseases nor engages in risky social behavior (examples are intravenous drug use or sexual activity with multiple partners). The  donor's travel history is screened to minimize risk of transmitting infections, such as malaria. The donated blood is tested for signs of infectious diseases, such as HIV and hepatitis. The blood is then tested to be sure it is compatible with you in order to minimize the chance of a transfusion reaction. If you or a relative donates blood, this is often done in anticipation of surgery and is not appropriate for emergency situations. It takes many days to process the donated blood. RISKS AND COMPLICATIONS Although transfusion therapy is very safe and saves many lives, the main dangers of transfusion include:   Getting an infectious disease.  Developing a transfusion reaction. This is an allergic reaction to something in the blood you were given. Every precaution is taken to prevent this. The decision to have a blood transfusion has been considered carefully by your caregiver before blood is given. Blood is not given unless the benefits outweigh the risks. AFTER THE TRANSFUSION  Right after receiving a blood transfusion, you will usually feel much better and more energetic. This is especially true if your red blood cells have gotten low (anemic). The transfusion raises the level of the red blood cells which carry oxygen, and this usually causes an energy increase.  The nurse administering the transfusion will monitor you carefully for complications. HOME CARE INSTRUCTIONS  No special instructions are needed after a transfusion. You may find your energy is better. Speak with your caregiver about any limitations on activity for underlying diseases you may have. SEEK MEDICAL CARE IF:   Your condition is not improving after your transfusion.  You  develop redness or irritation at the intravenous (IV) site. SEEK IMMEDIATE MEDICAL CARE IF:  Any of the following symptoms occur over the next 12 hours:  Shaking chills.  You have a temperature by mouth above 102 F (38.9 C), not controlled by  medicine.  Chest, back, or muscle pain.  People around you feel you are not acting correctly or are confused.  Shortness of breath or difficulty breathing.  Dizziness and fainting.  You get a rash or develop hives.  You have a decrease in urine output.  Your urine turns a dark color or changes to pink, red, or brown. Any of the following symptoms occur over the next 10 days:  You have a temperature by mouth above 102 F (38.9 C), not controlled by medicine.  Shortness of breath.  Weakness after normal activity.  The white part of the eye turns yellow (jaundice).  You have a decrease in the amount of urine or are urinating less often.  Your urine turns a dark color or changes to pink, red, or brown. Document Released: 02/05/2000 Document Revised: 05/02/2011 Document Reviewed: 09/24/2007 [facility] Patient Information 2014 [place], [place].  _______________________________________________________________________

## 2020-01-14 NOTE — Progress Notes (Addendum)
COVID Vaccine Completed:  x3 Date COVID Vaccine completed:  Booster 10-21 COVID vaccine manufacturer: Pfizer    [employer]   PCP - Ival Bible, MD Cardiologist - Loni Beckwith, MD  Clearance on chart.  Chest x-ray - 08-25-19 in Epic EKG - 08-27-19 in Epic Stress Test -  ECHO - 04-07-19 Care Everywhere Cardiac Cath -  Pacemaker/ICD device last checked: Holter Monitor - 07-08-19 Care Everywhere  Sleep Study - 2021, +sleep apnea CPAP - No  Fasting Blood Sugar -  Checks Blood Sugar - does not check.  Takes Metformin and monitors diet.  CBG 101 at PST  Blood Thinner Instructions:  Eliquis 5 mg.  Pt states that she is to stop 3 days prior to surgery Aspirin Instructions: Last Dose:  Anesthesia review: Afib, sleep apnea, diastolic heart failure.  Hx of ocular stroke  Patient denies shortness of breath, fever, cough and chest pain at PAT appointment/  Patient states that unable to climb stairs due to hip but was able to prior to hip pain.  Able to perform ADL's independently.   Patient verbalized understanding of instructions that were given to them at the PAT appointment. Patient was also instructed that they will need to review over the PAT instructions again at home before surgery.

## 2020-01-15 ENCOUNTER — Other Ambulatory Visit: Payer: Self-pay

## 2020-01-15 ENCOUNTER — Encounter (HOSPITAL_COMMUNITY): Payer: Self-pay

## 2020-01-15 ENCOUNTER — Encounter (HOSPITAL_COMMUNITY)
Admission: RE | Admit: 2020-01-15 | Discharge: 2020-01-15 | Disposition: A | Discharge status: Home / Self Care | Payer: Medicare Other | Source: Ambulatory Visit | Attending: Orthopedic Surgery | Admitting: Orthopedic Surgery

## 2020-01-15 DIAGNOSIS — Z01812 Encounter for preprocedural laboratory examination: Secondary | ICD-10-CM | POA: Diagnosis not present

## 2020-01-15 HISTORY — DX: Dyspnea, unspecified: R06.00

## 2020-01-15 HISTORY — DX: Unspecified malignant neoplasm of skin, unspecified: C44.90

## 2020-01-15 HISTORY — DX: Unspecified atrial fibrillation: I48.91

## 2020-01-15 HISTORY — DX: Varicose veins of unspecified lower extremity with ulcer of unspecified site: L97.909

## 2020-01-15 HISTORY — DX: Depression, unspecified: F32.A

## 2020-01-15 HISTORY — DX: Anxiety disorder, unspecified: F41.9

## 2020-01-15 HISTORY — DX: Anemia, unspecified: D64.9

## 2020-01-15 HISTORY — DX: Type 2 diabetes mellitus without complications: E11.9

## 2020-01-15 HISTORY — DX: Varicose veins of unspecified lower extremity with ulcer of unspecified site: I83.009

## 2020-01-15 HISTORY — DX: Pneumonia, unspecified organism: J18.9

## 2020-01-15 HISTORY — DX: Non-pressure chronic ulcer of unspecified part of unspecified lower leg with unspecified severity: L97.909

## 2020-01-15 LAB — PROTIME-INR
INR: 1.2 (ref 0.8–1.2)
Prothrombin Time: 14.8 seconds (ref 11.4–15.2)

## 2020-01-15 LAB — SURGICAL PCR SCREEN
MRSA, PCR: NEGATIVE
Staphylococcus aureus: NEGATIVE

## 2020-01-15 LAB — APTT: aPTT: 40 seconds — ABNORMAL HIGH (ref 24–36)

## 2020-01-15 LAB — GLUCOSE, CAPILLARY: Glucose-Capillary: 101 mg/dL — ABNORMAL HIGH (ref 70–99)

## 2020-01-17 ENCOUNTER — Other Ambulatory Visit: Payer: Self-pay

## 2020-01-17 ENCOUNTER — Encounter (HOSPITAL_BASED_OUTPATIENT_CLINIC_OR_DEPARTMENT_OTHER): Payer: Self-pay | Admitting: *Deleted

## 2020-01-17 DIAGNOSIS — R2681 Unsteadiness on feet: Secondary | ICD-10-CM | POA: Diagnosis not present

## 2020-01-17 DIAGNOSIS — L03116 Cellulitis of left lower limb: Secondary | ICD-10-CM | POA: Insufficient documentation

## 2020-01-17 DIAGNOSIS — E876 Hypokalemia: Secondary | ICD-10-CM | POA: Insufficient documentation

## 2020-01-17 DIAGNOSIS — I10 Essential (primary) hypertension: Secondary | ICD-10-CM | POA: Insufficient documentation

## 2020-01-17 DIAGNOSIS — Z79899 Other long term (current) drug therapy: Secondary | ICD-10-CM | POA: Insufficient documentation

## 2020-01-17 DIAGNOSIS — M1612 Unilateral primary osteoarthritis, left hip: Secondary | ICD-10-CM | POA: Diagnosis not present

## 2020-01-17 DIAGNOSIS — E119 Type 2 diabetes mellitus without complications: Secondary | ICD-10-CM | POA: Insufficient documentation

## 2020-01-17 DIAGNOSIS — Z7984 Long term (current) use of oral hypoglycemic drugs: Secondary | ICD-10-CM | POA: Insufficient documentation

## 2020-01-17 DIAGNOSIS — T502X5A Adverse effect of carbonic-anhydrase inhibitors, benzothiadiazides and other diuretics, initial encounter: Secondary | ICD-10-CM | POA: Insufficient documentation

## 2020-01-17 DIAGNOSIS — Z7901 Long term (current) use of anticoagulants: Secondary | ICD-10-CM | POA: Insufficient documentation

## 2020-01-17 DIAGNOSIS — Z87891 Personal history of nicotine dependence: Secondary | ICD-10-CM | POA: Insufficient documentation

## 2020-01-17 DIAGNOSIS — N289 Disorder of kidney and ureter, unspecified: Secondary | ICD-10-CM | POA: Insufficient documentation

## 2020-01-17 DIAGNOSIS — I4891 Unspecified atrial fibrillation: Secondary | ICD-10-CM | POA: Insufficient documentation

## 2020-01-17 NOTE — ED Triage Notes (Addendum)
Drainage from chronic ulcers on both lower legs. Worse on the left. Redness. She was seen by her cardiologist today. She is scheduled for a left hip replacement 12/1.

## 2020-01-17 NOTE — ED Notes (Signed)
Unable to locate pt. She was sitting in the bistro

## 2020-01-18 ENCOUNTER — Inpatient Hospital Stay (HOSPITAL_COMMUNITY)
Admission: EM | Admit: 2020-01-18 | Discharge: 2020-01-27 | DRG: 470 | Disposition: A | Discharge status: Skilled Nursing Facility | Payer: Medicare Other | Attending: Internal Medicine | Admitting: Internal Medicine

## 2020-01-18 ENCOUNTER — Emergency Department (HOSPITAL_BASED_OUTPATIENT_CLINIC_OR_DEPARTMENT_OTHER)
Admission: EM | Admit: 2020-01-18 | Discharge: 2020-01-18 | Disposition: A | Discharge status: Home / Self Care | Payer: Medicare Other | Source: Home / Self Care | Attending: Emergency Medicine | Admitting: Emergency Medicine

## 2020-01-18 ENCOUNTER — Emergency Department (HOSPITAL_COMMUNITY): Payer: Medicare Other

## 2020-01-18 ENCOUNTER — Other Ambulatory Visit: Payer: Self-pay

## 2020-01-18 ENCOUNTER — Inpatient Hospital Stay (HOSPITAL_COMMUNITY): Admission: RE | Admit: 2020-01-18 | Payer: PRIVATE HEALTH INSURANCE | Source: Ambulatory Visit

## 2020-01-18 DIAGNOSIS — Z87891 Personal history of nicotine dependence: Secondary | ICD-10-CM

## 2020-01-18 DIAGNOSIS — Z8349 Family history of other endocrine, nutritional and metabolic diseases: Secondary | ICD-10-CM

## 2020-01-18 DIAGNOSIS — M169 Osteoarthritis of hip, unspecified: Secondary | ICD-10-CM | POA: Diagnosis present

## 2020-01-18 DIAGNOSIS — Z8249 Family history of ischemic heart disease and other diseases of the circulatory system: Secondary | ICD-10-CM

## 2020-01-18 DIAGNOSIS — L02416 Cutaneous abscess of left lower limb: Secondary | ICD-10-CM | POA: Diagnosis not present

## 2020-01-18 DIAGNOSIS — E860 Dehydration: Secondary | ICD-10-CM | POA: Diagnosis present

## 2020-01-18 DIAGNOSIS — G2581 Restless legs syndrome: Secondary | ICD-10-CM | POA: Diagnosis present

## 2020-01-18 DIAGNOSIS — L03116 Cellulitis of left lower limb: Secondary | ICD-10-CM | POA: Diagnosis not present

## 2020-01-18 DIAGNOSIS — I4891 Unspecified atrial fibrillation: Secondary | ICD-10-CM | POA: Diagnosis present

## 2020-01-18 DIAGNOSIS — E119 Type 2 diabetes mellitus without complications: Secondary | ICD-10-CM | POA: Diagnosis present

## 2020-01-18 DIAGNOSIS — M1612 Unilateral primary osteoarthritis, left hip: Principal | ICD-10-CM | POA: Diagnosis present

## 2020-01-18 DIAGNOSIS — Z96649 Presence of unspecified artificial hip joint: Secondary | ICD-10-CM

## 2020-01-18 DIAGNOSIS — N179 Acute kidney failure, unspecified: Secondary | ICD-10-CM

## 2020-01-18 DIAGNOSIS — Z96653 Presence of artificial knee joint, bilateral: Secondary | ICD-10-CM | POA: Diagnosis present

## 2020-01-18 DIAGNOSIS — T502X5A Adverse effect of carbonic-anhydrase inhibitors, benzothiadiazides and other diuretics, initial encounter: Secondary | ICD-10-CM | POA: Diagnosis present

## 2020-01-18 DIAGNOSIS — Z79899 Other long term (current) drug therapy: Secondary | ICD-10-CM

## 2020-01-18 DIAGNOSIS — Z7901 Long term (current) use of anticoagulants: Secondary | ICD-10-CM

## 2020-01-18 DIAGNOSIS — N289 Disorder of kidney and ureter, unspecified: Secondary | ICD-10-CM

## 2020-01-18 DIAGNOSIS — K219 Gastro-esophageal reflux disease without esophagitis: Secondary | ICD-10-CM | POA: Diagnosis present

## 2020-01-18 DIAGNOSIS — Z881 Allergy status to other antibiotic agents status: Secondary | ICD-10-CM

## 2020-01-18 DIAGNOSIS — M25552 Pain in left hip: Secondary | ICD-10-CM

## 2020-01-18 DIAGNOSIS — I480 Paroxysmal atrial fibrillation: Secondary | ICD-10-CM | POA: Diagnosis present

## 2020-01-18 DIAGNOSIS — E876 Hypokalemia: Secondary | ICD-10-CM | POA: Diagnosis present

## 2020-01-18 DIAGNOSIS — R2681 Unsteadiness on feet: Secondary | ICD-10-CM

## 2020-01-18 DIAGNOSIS — Z91040 Latex allergy status: Secondary | ICD-10-CM

## 2020-01-18 DIAGNOSIS — R410 Disorientation, unspecified: Secondary | ICD-10-CM | POA: Diagnosis not present

## 2020-01-18 DIAGNOSIS — E785 Hyperlipidemia, unspecified: Secondary | ICD-10-CM | POA: Diagnosis present

## 2020-01-18 DIAGNOSIS — Z66 Do not resuscitate: Secondary | ICD-10-CM | POA: Diagnosis present

## 2020-01-18 DIAGNOSIS — I10 Essential (primary) hypertension: Secondary | ICD-10-CM | POA: Diagnosis not present

## 2020-01-18 DIAGNOSIS — H409 Unspecified glaucoma: Secondary | ICD-10-CM | POA: Diagnosis present

## 2020-01-18 DIAGNOSIS — I959 Hypotension, unspecified: Secondary | ICD-10-CM | POA: Diagnosis present

## 2020-01-18 DIAGNOSIS — Z9011 Acquired absence of right breast and nipple: Secondary | ICD-10-CM

## 2020-01-18 DIAGNOSIS — Z91013 Allergy to seafood: Secondary | ICD-10-CM

## 2020-01-18 DIAGNOSIS — M47816 Spondylosis without myelopathy or radiculopathy, lumbar region: Secondary | ICD-10-CM | POA: Diagnosis present

## 2020-01-18 DIAGNOSIS — G8929 Other chronic pain: Secondary | ICD-10-CM | POA: Diagnosis present

## 2020-01-18 DIAGNOSIS — E669 Obesity, unspecified: Secondary | ICD-10-CM | POA: Diagnosis present

## 2020-01-18 DIAGNOSIS — Z6831 Body mass index (BMI) 31.0-31.9, adult: Secondary | ICD-10-CM

## 2020-01-18 DIAGNOSIS — Z419 Encounter for procedure for purposes other than remedying health state, unspecified: Secondary | ICD-10-CM

## 2020-01-18 DIAGNOSIS — Z8673 Personal history of transient ischemic attack (TIA), and cerebral infarction without residual deficits: Secondary | ICD-10-CM

## 2020-01-18 DIAGNOSIS — Z7984 Long term (current) use of oral hypoglycemic drugs: Secondary | ICD-10-CM

## 2020-01-18 DIAGNOSIS — Z20822 Contact with and (suspected) exposure to covid-19: Secondary | ICD-10-CM | POA: Diagnosis present

## 2020-01-18 DIAGNOSIS — Z888 Allergy status to other drugs, medicaments and biological substances status: Secondary | ICD-10-CM

## 2020-01-18 DIAGNOSIS — Z85828 Personal history of other malignant neoplasm of skin: Secondary | ICD-10-CM

## 2020-01-18 DIAGNOSIS — Z90710 Acquired absence of both cervix and uterus: Secondary | ICD-10-CM

## 2020-01-18 DIAGNOSIS — E039 Hypothyroidism, unspecified: Secondary | ICD-10-CM | POA: Diagnosis present

## 2020-01-18 HISTORY — DX: Cellulitis of left lower limb: L03.116

## 2020-01-18 LAB — CBC WITH DIFFERENTIAL/PLATELET
Abs Immature Granulocytes: 0.04 10*3/uL (ref 0.00–0.07)
Abs Immature Granulocytes: 0.06 10*3/uL (ref 0.00–0.07)
Basophils Absolute: 0.1 10*3/uL (ref 0.0–0.1)
Basophils Absolute: 0.1 10*3/uL (ref 0.0–0.1)
Basophils Relative: 1 %
Basophils Relative: 1 %
Eosinophils Absolute: 0.2 10*3/uL (ref 0.0–0.5)
Eosinophils Absolute: 0 10*3/uL (ref 0.0–0.5)
Eosinophils Relative: 2 %
Eosinophils Relative: 0 %
HCT: 38 % (ref 36.0–46.0)
HCT: 37.3 % (ref 36.0–46.0)
Hemoglobin: 13.1 g/dL (ref 12.0–15.0)
Hemoglobin: 12.5 g/dL (ref 12.0–15.0)
Immature Granulocytes: 0 %
Immature Granulocytes: 1 %
Lymphocytes Relative: 26 %
Lymphocytes Relative: 13 %
Lymphs Abs: 2.7 10*3/uL (ref 0.7–4.0)
Lymphs Abs: 1.4 10*3/uL (ref 0.7–4.0)
MCH: 32.6 pg (ref 26.0–34.0)
MCH: 32.2 pg (ref 26.0–34.0)
MCHC: 34.5 g/dL (ref 30.0–36.0)
MCHC: 33.5 g/dL (ref 30.0–36.0)
MCV: 94.5 fL (ref 80.0–100.0)
MCV: 96.1 fL (ref 80.0–100.0)
Monocytes Absolute: 1.3 10*3/uL — ABNORMAL HIGH (ref 0.1–1.0)
Monocytes Absolute: 1.4 10*3/uL — ABNORMAL HIGH (ref 0.1–1.0)
Monocytes Relative: 13 %
Monocytes Relative: 12 %
Neutro Abs: 6.3 10*3/uL (ref 1.7–7.7)
Neutro Abs: 8.1 10*3/uL — ABNORMAL HIGH (ref 1.7–7.7)
Neutrophils Relative %: 58 %
Neutrophils Relative %: 73 %
Platelets: 298 10*3/uL (ref 150–400)
Platelets: 297 10*3/uL (ref 150–400)
RBC: 4.02 MIL/uL (ref 3.87–5.11)
RBC: 3.88 MIL/uL (ref 3.87–5.11)
RDW: 12.8 % (ref 11.5–15.5)
RDW: 12.9 % (ref 11.5–15.5)
WBC: 10.6 10*3/uL — ABNORMAL HIGH (ref 4.0–10.5)
WBC: 11 10*3/uL — ABNORMAL HIGH (ref 4.0–10.5)
nRBC: 0 % (ref 0.0–0.2)
nRBC: 0 % (ref 0.0–0.2)

## 2020-01-18 LAB — SEDIMENTATION RATE: Sed Rate: 55 mm/hr — ABNORMAL HIGH (ref 0–22)

## 2020-01-18 LAB — BASIC METABOLIC PANEL
Anion gap: 12 (ref 5–15)
Anion gap: 10 (ref 5–15)
BUN: 40 mg/dL — ABNORMAL HIGH (ref 8–23)
BUN: 44 mg/dL — ABNORMAL HIGH (ref 8–23)
CO2: 26 mmol/L (ref 22–32)
CO2: 25 mmol/L (ref 22–32)
Calcium: 9.1 mg/dL (ref 8.9–10.3)
Calcium: 9.1 mg/dL (ref 8.9–10.3)
Chloride: 98 mmol/L (ref 98–111)
Chloride: 101 mmol/L (ref 98–111)
Creatinine, Ser: 1.36 mg/dL — ABNORMAL HIGH (ref 0.44–1.00)
Creatinine, Ser: 1.37 mg/dL — ABNORMAL HIGH (ref 0.44–1.00)
GFR, Estimated: 38 mL/min — ABNORMAL LOW (ref >60)
GFR, Estimated: 37 mL/min — ABNORMAL LOW (ref >60)
Glucose, Bld: 107 mg/dL — ABNORMAL HIGH (ref 70–99)
Glucose, Bld: 114 mg/dL — ABNORMAL HIGH (ref 70–99)
Potassium: 3 mmol/L — ABNORMAL LOW (ref 3.5–5.1)
Potassium: 3.5 mmol/L (ref 3.5–5.1)
Sodium: 136 mmol/L (ref 135–145)
Sodium: 136 mmol/L (ref 135–145)

## 2020-01-18 MED ORDER — CEPHALEXIN 250 MG PO CAPS
500.0000 mg | ORAL_CAPSULE | Freq: Once | ORAL | Status: AC
  Administered 2020-01-18: 500 mg via ORAL
  Filled 2020-01-18: qty 2

## 2020-01-18 MED ORDER — OXYCODONE-ACETAMINOPHEN 5-325 MG PO TABS
2.0000 | ORAL_TABLET | Freq: Once | ORAL | Status: AC
  Administered 2020-01-18: 2 via ORAL
  Filled 2020-01-18: qty 2

## 2020-01-18 MED ORDER — POTASSIUM CHLORIDE CRYS ER 20 MEQ PO TBCR: EXTENDED_RELEASE_TABLET | ORAL | 0 refills | Status: DC

## 2020-01-18 MED ORDER — CEPHALEXIN 500 MG PO CAPS: 500.0000 mg | ORAL_CAPSULE | Freq: Three times a day (TID) | ORAL | 0 refills | Status: DC

## 2020-01-18 MED ORDER — SODIUM CHLORIDE 0.9 % IV SOLN: Freq: Once | INTRAVENOUS | Status: AC

## 2020-01-18 MED ORDER — POTASSIUM CHLORIDE CRYS ER 20 MEQ PO TBCR
40.0000 meq | EXTENDED_RELEASE_TABLET | Freq: Once | ORAL | Status: AC
  Administered 2020-01-18: 40 meq via ORAL
  Filled 2020-01-18: qty 2

## 2020-01-18 NOTE — ED Provider Notes (Signed)
[facility] EMERGENCY DEPARTMENT Provider Note   CSN: [id number] Arrival date & time: 01/17/20  2117    History Chief Complaint  Patient presents with  . Leg Pain  . Wound Check    Dana Bradford is a 86 y.o. female.  The history is provided by the patient.  Leg Pain Wound Check  She has history of hypertension, diabetes, atrial fibrillation anticoagulated on apixaban and is scheduled to have left hip arthroplasty done in 5 days.  Over the last 2 days, she has had increasing redness and pain in her legs.  She has a history of venous stasis and cellulitis.  She has not had any fever, chills, sweats.  There has been some clear drainage from her left foot.  She has been treating it with compression dressings at home.  Past Medical History:  Diagnosis Date  . Acute kidney failure ([place]) 2016 to 2017 due to vacomycin   no kidney issues now  . Anemia   . Anxiety   . Arthritis   . Atrial fibrillation ([place])   . Depression   . Diabetes mellitus without complication ([place])   . Dyspnea   . Food allergy    Fish (Hives)   . Glaucoma   . Hypertension   . Impaired fasting glucose   . Other specified acquired hypothyroidism   . Pneumonia   . Restless leg   . Restless leg   . Skin cancer    Forehead  . 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 Venous stasis ulcer ([place])     Patient Active Problem List   Diagnosis Date Noted  . Back pain 08/24/2019  . Generalized weakness 08/24/2019  . OA (osteoarthritis) of hip 02/28/2018  . GERD (gastroesophageal reflux disease) 11/13/2012  . Essential hypertension, benign 06/17/2012  . Impaired fasting glucose 06/17/2012  . Other specified acquired hypothyroidism 06/05/2012  . Restless leg syndrome 06/05/2012    Past Surgical History:  Procedure Laterality Date  . ABDOMINAL HYSTERECTOMY  1993   Uterine Prolapse partial  . MASTECTOMY  1974   right breast not sure if cancer 1960's  .  REPLACEMENT TOTAL KNEE BILATERAL Bilateral   . SMALL TOE RIGHT FOOT REMOVED  04/2017   DUE TO INFECTION  . Spleenectomy  2000  . TOTAL HIP ARTHROPLASTY Right 02/28/2018   Procedure: RIGHT TOTAL HIP ARTHROPLASTY ANTERIOR APPROACH;  Surgeon: Gaynelle Arabian, MD;  Location: [facility] ORS;  Service: Orthopedics;  Laterality: Right;  120min  . TOTAL KNEE ARTHROPLASTY WITH REVISION COMPONENTS       OB History   No obstetric history on file.     Family History  Problem Relation Age of Onset  . Cancer Mother   . Heart disease Father   . Hyperlipidemia Father     Social History   Tobacco Use  . Smoking status: Former Smoker    Packs/day: 1.00    Years: 30.00    Pack years: 30.00    Types: Cigarettes  . Smokeless tobacco: Never Used  . Tobacco comment: qiot 50 yrs ago  Vaping Use  . Vaping Use: Never used  Substance Use Topics  . Alcohol use: Not Currently  . Drug use: No    Home Medications Prior to Admission medications   Medication Sig Start Date End Date Taking? Authorizing Provider  acetaminophen (TYLENOL) 500 MG tablet Take 2,000 mg by mouth daily as needed for mild pain or headache.  [provider]  bismuth subsalicylate (PEPTO BISMOL) 262 MG/15ML suspension Take 30 mLs by mouth every 6 (six) hours as needed for indigestion or diarrhea or loose stools.    [provider]  brimonidine (ALPHAGAN) 0.2 % ophthalmic solution Place 1 drop into both eyes 2 (two) times daily.    [provider]  Cholecalciferol (VITAMIN D3) 50 MCG (2000 UT) TABS Take 2,000 Units by mouth daily.    [provider]  diltiazem (TIAZAC) 180 MG 24 hr capsule Take 180 mg by mouth daily. 07/26/19   [provider]  ELIQUIS 5 MG TABS tablet Take 5 mg by mouth 2 (two) times daily. 08/02/19   [provider]  famotidine (PEPCID) 40 MG tablet Take 40 mg by mouth at bedtime.  08/20/19   [provider]  ferrous sulfate 325 (65 FE) MG tablet Take 325 mg by  mouth 2 (two) times daily.    [provider]  furosemide (LASIX) 20 MG tablet Take 20 mg by mouth daily.    [provider]  irbesartan-hydrochlorothiazide (AVALIDE) 300-12.5 MG tablet Take 1 tablet by mouth daily. 01/09/20   [provider]  latanoprost (XALATAN) 0.005 % ophthalmic solution Place 1 drop into both eyes at bedtime.    [provider]  levothyroxine (SYNTHROID, LEVOTHROID) 88 MCG tablet Take 88 mcg by mouth daily before breakfast.    [provider]  metFORMIN (GLUCOPHAGE) 500 MG tablet Take 500 mg by mouth daily.  06/11/19   [provider]  metoprolol succinate (TOPROL-XL) 25 MG 24 hr tablet Take 3 tablets (75 mg total) by mouth 2 (two) times daily. 08/27/19 01/14/20  Elodia Florence., MD  omeprazole (PRILOSEC) 40 MG capsule Take 40 mg by mouth daily.    [provider]  Polyethyl Glycol-Propyl Glycol (SYSTANE) 0.4-0.3 % GEL ophthalmic gel Place 1 application into both eyes 4 (four) times daily as needed (Dry eyes).     [provider]  pramipexole (MIRAPEX) 1 MG tablet Take 1 mg by mouth at bedtime.     [provider]  simvastatin (ZOCOR) 20 MG tablet Take 20 mg by mouth every evening.    [provider]  timolol (BETIMOL) 0.25 % ophthalmic solution Place 1 drop into both eyes 2 (two) times daily.    [provider]  triamcinolone ointment (KENALOG) 0.1 % Apply 1 application topically 2 (two) times daily as needed (skin irritation.).    [provider]  vitamin C (ASCORBIC ACID) 500 MG tablet Take 500 mg by mouth 2 (two) times daily.    [provider]    Allergies    Arthrotec [diclofenac-misoprostol], Doxycycline, Amlodipine, Celebrex [celecoxib], Vancomycin, Diclofenac, Latex, and Shellfish allergy  Review of Systems   Review of Systems  All other systems reviewed and are negative.   Physical Exam Updated Vital Signs BP 105/68   Pulse 97   Temp  98.3 F (36.8 C) (Oral)   Resp 18   Ht 4\' 10"  (1.473 m)   Wt 69.4 kg   SpO2 91%   BMI 31.98 kg/m   Physical Exam Vitals and nursing note reviewed.   86 year old female, resting comfortably and in no acute distress. Vital signs are normal. Oxygen saturation is 91%, which is borderline hypoxic. Head is normocephalic and atraumatic. PERRLA, EOMI. Oropharynx is clear. Neck is nontender and supple without adenopathy or JVD. Back is nontender and there is no CVA tenderness. Lungs are clear without rales, wheezes, or  rhonchi. Chest is nontender. Heart has regular rate and rhythm without murmur. Abdomen is soft, flat, nontender without masses or hepatosplenomegaly and peristalsis is normoactive. Extremities have moderate venous stasis changes bilaterally, worse on the left.  There is erythema and warmth of both lower legs, worse on the left.  Capillary refill is prompt.  I can see no break in the skin. Skin is warm and dry without rash. Neurologic: Mental status is normal, cranial nerves are intact, there are no motor or sensory deficits.    ED Results / Procedures / Treatments   Labs (all labs ordered are listed, but only abnormal results are displayed) Labs Reviewed  BASIC METABOLIC PANEL - Abnormal; Notable for the following components:      Result Value   Potassium 3.0 (*)    Glucose, Bld 107 (*)    BUN 40 (*)    Creatinine, Ser 1.36 (*)    GFR, Estimated 38 (*)    All other components within normal limits  CBC WITH DIFFERENTIAL/PLATELET - Abnormal; Notable for the following components:   WBC 10.6 (*)    Monocytes Absolute 1.3 (*)    All other components within normal limits  SEDIMENTATION RATE - Abnormal; Notable for the following components:   Sed Rate 55 (*)    All other components within normal limits    Procedures Procedures   Medications Ordered in ED Medications  cephALEXin (KEFLEX) capsule 500 mg (500 mg Oral Given 01/18/20 0247)  potassium chloride SA  (KLOR-CON) CR tablet 40 mEq (40 mEq Oral Given 01/18/20 0309)    ED Course  I have reviewed the triage vital signs and the nursing notes.  Pertinent lab results that were available during my care of the patient were reviewed by me and considered in my medical decision making (see chart for details).  MDM Rules/Calculators/A&P Chronic venous stasis of both legs with recent worsening, possible cellulitis.  Will check screening labs, start on antibiotics.  Surgery should probably be delayed until it is clear that there is no ongoing infection.  Old records are reviewed showing prior hospitalization for cellulitis, ED visit for diabetic foot ulcer.  Labs show mild renal insufficiency which is new, moderately elevated sedimentation rate consistent with cellulitis. Also, noted to have hypokalemia, presumably secondary to diuretic use. She is given a dose of cephalexin and K-Dur and discharged with prescriptions for both. She will need to contact her surgeon to see if he wants to delay her surgery. Follow-up with PCP to monitor treatment of cellulitis.  Final Clinical Impression(s) / ED Diagnoses Final diagnoses:  Cellulitis of left lower leg  Diuretic-induced hypokalemia  Renal insufficiency  Chronic anticoagulation    Rx / DC Orders ED Discharge Orders         Ordered    cephALEXin (KEFLEX) 500 MG capsule  3 times daily        01/18/20 0354    potassium chloride SA (KLOR-CON) 20 MEQ tablet        01/18/20 1157           Delora Fuel, MD 26/20/35 [phone]

## 2020-01-18 NOTE — ED Triage Notes (Signed)
Presents per EMS with c/o lower back  Pain. Pt was seen at [facility] yesterday and while family was placing her in the car, they twisted her to pivot and now she c/o back pain.

## 2020-01-18 NOTE — H&P (Addendum)
History and Physical   Dana Bradford EHO:[id number] DOB: 1930/11/20 DOA: 01/18/2020  PCP: Jonathon Resides, MD  Outpatient Specialists: Dr. Baxter Hire, cardiologist  Patient coming from: home   I have personally briefly reviewed patient's old medical records in [place].  Chief Concern: Left hip pain  H&P obtained from daughter because patient is sleeping status post pain medication  HPI: Dana Bradford is a 86 y.o. female with medical history significant for afib on eliquis 5 mg BID 75 mg BID, hypertension, hypothyroid, presented to the emergency department for chief concerns of left hip pain.  Patient has chronic back pain and it worsened after presentation to ED at [facility]. She was discharged home with treatments for cellulitis.  While being carried from her wheelchair to the car, per daughter states that she was deposited heavily and it resulted in worsening of her left hip.  Patient denies fever, chest pain, shortness of breath, abdominal pain.  Allergies: amlodipine causes angioedema   ED Course: Gust with ED provider, requesting admission for pain control of left hip  Review of Systems: As per HPI otherwise 10 point review of systems negative.  Assessment/Plan  Active Problems:   Cellulitis and abscess of left leg   Left lower extremity swelling and erythema- -Possible cellulitis however lower extremity DVT cannot be excluded -Doppler sound to assess for DVT of the lower extremity ordered -Ceftriaxone -IVF  Advanced osteoarthritic changes of the left hip-patient has orthopedic surgery scheduled for outpatient on 01/23/2019  Leukocytosis -likely secondary to possible cellulitis  Hypertension-holding home medication due to low normotensive  Hyperlipidemia-resume home medication Hypothyroid resumed home levothyroxine 88 mcg A fibrillation-on Eliquis, resumed Chart reviewed.   DVT prophylaxis: On Eliquis Code Status: DNR Diet: Heart healthy/carb  modified Family Communication: Discussed with daughter at bedside Disposition Plan: Pending clinical course Consults called: None at this time Admission status: Observation with telemetry  Past Medical History:  Diagnosis Date  . Acute kidney failure ([place]) 2016 to 2017 due to vacomycin   no kidney issues now  . Anemia   . Anxiety   . Arthritis   . Atrial fibrillation ([place])   . Depression   . Diabetes mellitus without complication ([place])   . Dyspnea   . Food allergy    Fish ([place])   . Glaucoma   . Hypertension   . Impaired fasting glucose   . Other specified acquired hypothyroidism   . Pneumonia   . Restless leg   . Restless leg   . Skin cancer    Forehead  . 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 Venous stasis ulcer ([place])    Past Surgical History:  Procedure Laterality Date  . ABDOMINAL HYSTERECTOMY  1993   Uterine Prolapse partial  . MASTECTOMY  1974   right breast not sure if cancer 1960's  . REPLACEMENT TOTAL KNEE BILATERAL Bilateral   . SMALL TOE RIGHT FOOT REMOVED  04/2017   DUE TO INFECTION  . Spleenectomy  2000  . TOTAL HIP ARTHROPLASTY Right 02/28/2018   Procedure: RIGHT TOTAL HIP ARTHROPLASTY ANTERIOR APPROACH;  Surgeon: Gaynelle Arabian, MD;  Location: [facility] ORS;  Service: Orthopedics;  Laterality: Right;  120min  . TOTAL KNEE ARTHROPLASTY WITH REVISION COMPONENTS     Social History:  reports that she has quit smoking. Her smoking use included cigarettes. She has a 30.00 pack-year smoking history. She has never used smokeless tobacco. She reports previous alcohol  use. She reports that she does not use drugs.  Allergies  Allergen Reactions  . Arthrotec [Diclofenac-Misoprostol] Shortness Of Breath and Diarrhea  . Doxycycline Nausea And Vomiting and Rash    Significant enough to d/c treatment and refuse to take more Vomiting (intolerance) The first time she took it she had N/V and the second she had a rash.    . Latex  Rash and Other (See Comments)  . Amlodipine     angioedema  . Celebrex [Celecoxib] Other (See Comments)    GI Bleeding  . Vancomycin     Acute kidney failure  . Diclofenac Diarrhea  . Shellfish Allergy Itching and Rash   Family History  Problem Relation Age of Onset  . Cancer Mother   . Heart disease Father   . Hyperlipidemia Father    Family history: Family history reviewed and not pertinent  Prior to Admission medications   Medication Sig Start Date End Date Taking? Authorizing Provider  acetaminophen (TYLENOL) 500 MG tablet Take 2,000 mg by mouth daily as needed for mild pain or headache.     [provider]  bismuth subsalicylate (PEPTO BISMOL) 262 MG/15ML suspension Take 30 mLs by mouth every 6 (six) hours as needed for indigestion or diarrhea or loose stools.    [provider]  brimonidine (ALPHAGAN) 0.2 % ophthalmic solution Place 1 drop into both eyes 2 (two) times daily.    [provider]  cephALEXin (KEFLEX) 500 MG capsule Take 1 capsule (500 mg total) by mouth 3 (three) times daily. 25/36/64   Delora Fuel, MD  Cholecalciferol (VITAMIN D3) 50 MCG (2000 UT) TABS Take 2,000 Units by mouth daily.    [provider]  diltiazem (TIAZAC) 180 MG 24 hr capsule Take 180 mg by mouth daily. 07/26/19   [provider]  ELIQUIS 5 MG TABS tablet Take 5 mg by mouth 2 (two) times daily. 08/02/19   [provider]  famotidine (PEPCID) 40 MG tablet Take 40 mg by mouth at bedtime.  08/20/19   [provider]  ferrous sulfate 325 (65 FE) MG tablet Take 325 mg by mouth 2 (two) times daily.    [provider]  furosemide (LASIX) 20 MG tablet Take 20 mg by mouth daily.    [provider]  irbesartan-hydrochlorothiazide (AVALIDE) 300-12.5 MG tablet Take 1 tablet by mouth daily. 01/09/20   [provider]  latanoprost (XALATAN) 0.005 % ophthalmic solution Place 1 drop into both eyes at bedtime.    [provider]  levothyroxine (SYNTHROID, LEVOTHROID) 88 MCG tablet Take 88 mcg by mouth daily before breakfast.    [provider]  metFORMIN (GLUCOPHAGE) 500 MG tablet Take 500 mg by mouth daily.  06/11/19   [provider]  metoprolol succinate (TOPROL-XL) 25 MG 24 hr tablet Take 3 tablets (75 mg total) by mouth 2 (two) times daily. 08/27/19 01/14/20  Elodia Florence., MD  omeprazole (PRILOSEC) 40 MG capsule Take 40 mg by mouth daily.    [provider]  Polyethyl Glycol-Propyl Glycol (SYSTANE) 0.4-0.3 % GEL ophthalmic gel Place 1 application into both eyes 4 (four) times daily as needed (Dry eyes).     [provider]  potassium chloride SA (KLOR-CON) 20 MEQ tablet Take one tablet twice a day for ten days, then take one table daily 40/34/74   Delora Fuel, MD  pramipexole (MIRAPEX) 1 MG tablet Take 1 mg by mouth at bedtime.     [provider]  simvastatin (ZOCOR) 20 MG tablet Take 20 mg by mouth every evening.    [provider]  timolol (BETIMOL) 0.25 % ophthalmic solution Place 1 drop into both eyes 2 (two) times daily.    [provider]  triamcinolone ointment (KENALOG) 0.1 % Apply 1 application topically 2 (two) times daily as needed (skin irritation.).    [provider]  vitamin C (ASCORBIC ACID) 500 MG tablet Take 500 mg by mouth 2 (two) times daily.    [provider]   Physical Exam: Vitals:   01/18/20 2100 01/18/20 2239 01/18/20 2330 01/19/20 0100  BP: (!) 88/63 (!) 125/106 93/62 (!) 89/55  Pulse: (!) 54 88 (!) 108 84  Resp: 17 18 18 15   Temp:      TempSrc:      SpO2: 91% 94% 93% 95%   Constitutional: appears age-appropriate, NAD, calm, comfortable Eyes: PERRL, lids and conjunctivae normal ENMT: Mucous membranes are moist. Posterior pharynx clear of any exudate or lesions. Age-appropriate dentition. Hearing appropriate Neck: normal, supple, no masses, no thyromegaly Respiratory: clear to  auscultation bilaterally, no wheezing, no crackles. Normal respiratory effort. No accessory muscle use.  Cardiovascular: Regular rate and rhythm, no murmurs / rubs / gallops. No extremity edema. 2+ pedal pulses. No carotid bruits.  Abdomen: no tenderness, no masses palpated, no hepatosplenomegaly. Bowel sounds positive.  Musculoskeletal: no clubbing / cyanosis. No joint deformity upper and lower extremities. Good ROM, no contractures, no atrophy. Normal muscle tone.  Skin: no rashes, lesions, ulcers. No induration Neurologic: Sensation intact. Strength 5/5 in all 4.  Psychiatric: Normal judgment and insight. Alert and oriented x 3. Normal mood.   EKG: Not indicated  Imaging on Admission: Personally reviewed and I agree with radiologist reading as below.  DG Lumbar Spine Complete  Result Date: 01/18/2020 CLINICAL DATA:  Acute low back pain EXAM: LUMBAR SPINE - COMPLETE 4+ VIEW COMPARISON:  08/24/2019 FINDINGS: There is no evidence of lumbar spine fracture. Vertebral body heights appear maintained. Unchanged alignment including grade 1 anterolisthesis of L4 on L5 and trace retrolisthesis of L1 on L2. Similar facet predominant degenerative changes of the lower lumbar spine partially visualized right hip arthroplasty. Severe, end-stage osteoarthritis of the left hip. IMPRESSION: 1. No evidence of acute fracture or traumatic listhesis of the lumbar spine. 2. Severe, end-stage osteoarthritis of the left hip. Electronically Signed   By: Davina Poke D.O.   On: 01/18/2020 18:37   CT Hip Left Wo Contrast  Result Date: 01/18/2020 CLINICAL DATA:  Acute on chronic left hip pain, concern for occult fracture. EXAM: CT OF THE LEFT HIP WITHOUT CONTRAST TECHNIQUE: Multidetector CT imaging of the left hip was performed according to the standard protocol. Multiplanar CT image reconstructions were also generated. COMPARISON:  Same day hip radiograph. FINDINGS: Bones/Joint/Cartilage No acute osseous injury is  identified. There is no left hip joint dislocation or joint effusion. There are severe degenerative changes of the left hip with cyst formation. Ligaments Suboptimally assessed by CT. Muscles and Tendons No abnormality noted. Soft tissues Visible portions of the pelvis and abdomen appear normal. Multiple left inguinal lymph nodes are noted, measuring up to 8 mm in short axis. Vascular calcifications are seen in the left common femoral artery. IMPRESSION: 1. No acute osseous abnormality. 2. Severe degenerative changes of the left hip. Electronically Signed   By: Zerita Boers M.D.   On: 01/18/2020 20:12   DG Hip Unilat W or Wo Pelvis 2-3 Views Left  Result Date: 01/18/2020 CLINICAL  DATA:  Worsening left hip pain. EXAM: DG HIP (WITH OR WITHOUT PELVIS) 2-3V LEFT COMPARISON:  None. FINDINGS: There is no evidence of fracture or dislocation. Advanced osteoarthritic changes of the left hip with bone-on-bone contact, subchondral sclerosis, subchondral cysts and osteophyte formation. Post right hip arthroplasty. Intact hardware. IMPRESSION: Advanced osteoarthritic changes of the left hip. Electronically Signed   By: Fidela Salisbury M.D.   On: 01/18/2020 18:36   Labs on Admission: I have personally reviewed following labs  CBC: Recent Labs  Lab 01/18/20 0215 01/18/20 1742  WBC 10.6* 11.0*  NEUTROABS 6.3 8.1*  HGB 13.1 12.5  HCT 38.0 37.3  MCV 94.5 96.1  PLT 298 817   Basic Metabolic Panel: Recent Labs  Lab 01/18/20 0215 01/18/20 1742  NA 136 136  K 3.0* 3.5  CL 98 101  CO2 26 25  GLUCOSE 107* 114*  BUN 40* 44*  CREATININE 1.36* 1.37*  CALCIUM 9.1 9.1   GFR: Estimated Creatinine Clearance: 23.9 mL/min (A) (by C-G formula based on SCr of 1.37 mg/dL (H)). Liver Function Tests: No results for input(s): AST, ALT, ALKPHOS, BILITOT, PROT, ALBUMIN in the last 168 hours. No results for input(s): LIPASE, AMYLASE in the last 168 hours. No results for input(s): AMMONIA in the last 168  hours.  Coagulation Profile: Recent Labs  Lab 01/15/20 1520  INR 1.2   CBG: Recent Labs  Lab 01/15/20 1503  GLUCAP 101*   Burnice Oestreicher N Lucius Wise D.O. [facility]  If 2AM-7AM, please contact overnight-coverage provider If 7AM-7PM, please contact day coverage provider [email or web address]  01/19/2020, 1:43 AM

## 2020-01-18 NOTE — ED Notes (Signed)
Pt to xray via stretcher

## 2020-01-18 NOTE — Discharge Instructions (Addendum)
Call your orthopedic doctor to let him know you have been diagnosed with cellulitis. He may need to reschedule your surgery.

## 2020-01-18 NOTE — ED Provider Notes (Signed)
[facility] DEPT Provider Note   CSN: [id number] Arrival date & time: 01/18/20  1617     History Chief Complaint  Patient presents with  . Back Pain    Dana Bradford is a 86 y.o. female with history of diabetes, chronic venous insufficiency, atrial fibrillation on Eliquis, Cardizem presents to the ED for evaluation of back pain and left hip pain.  Constant, worse with any movement, weightbearing.  Back pain is in the middle of her lumbar spine and it radiates outward bilaterally.  States this began last night as she was getting assisted out from the ED at med center [facility] onto her car.  States the person who was helping her out on a wheelchair picked her up and placed her in her car very abruptly and thinks this is a cause to her worsening left hip and low back pain.  States she has never had back pain.  States she has long history of left hip pain and is awaiting replacement on December 1 per Dr. Maureen Ralphs.  Takes Tylenol as needed for pain.  Denies any changes in her bladder or bowel control, new weakness or numbness in her extremities.  No fever.  Has been feeling well other than the worsening pain in her back and hip.  No cough, chest pain, shortness of breath, nausea, vomiting, abdominal pain.  Daughter at bedside states that after the ED visit patient's gait was very different.  Her left leg seems to be turned inward and she is barely able to put some weight.  Patient lives alone.  Her daughter assisted her into her house and into her bedroom but states it took 45 minutes to get her settled due to the worsening pain.  Daughter is concerned about her left hip.  Daughter states patient has been otherwise well.  HPI     Past Medical History:  Diagnosis Date  . Acute kidney failure ([place]) 2016 to 2017 due to vacomycin   no kidney issues now  . Anemia   . Anxiety   . Arthritis   . Atrial fibrillation ([place])   . Depression   . Diabetes mellitus without  complication ([place])   . Dyspnea   . Food allergy    Fish ([place])   . Glaucoma   . Hypertension   . Impaired fasting glucose   . Other specified acquired hypothyroidism   . Pneumonia   . Restless leg   . Restless leg   . Skin cancer    Forehead  . Stroke ([place]) 05/2017   ocular stoke on plavix , right eye some vision loss  . Venous stasis ulcer ([place])     Patient Active Problem List   Diagnosis Date Noted  . Back pain 08/24/2019  . Generalized weakness 08/24/2019  . OA (osteoarthritis) of hip 02/28/2018  . GERD (gastroesophageal reflux disease) 11/13/2012  . Essential hypertension, benign 06/17/2012  . Impaired fasting glucose 06/17/2012  . Other specified acquired hypothyroidism 06/05/2012  . Restless leg syndrome 06/05/2012    Past Surgical History:  Procedure Laterality Date  . ABDOMINAL HYSTERECTOMY  1993   Uterine Prolapse partial  . MASTECTOMY  1974   right breast not sure if cancer 1960's  . REPLACEMENT TOTAL KNEE BILATERAL Bilateral   . SMALL TOE RIGHT FOOT REMOVED  04/2017   DUE TO INFECTION  . Spleenectomy  2000  . TOTAL HIP ARTHROPLASTY Right 02/28/2018   Procedure: RIGHT TOTAL HIP ARTHROPLASTY ANTERIOR APPROACH;  Surgeon: Gaynelle Arabian, MD;  Location: [facility] OR[facility];  Service: Orthopedics;  Laterality: Right;  160min  . TOTAL KNEE ARTHROPLASTY WITH REVISION COMPONENTS       OB History   No obstetric history on file.     Family History  Problem Relation Age of Onset  . Cancer Mother   . Heart disease Father   . Hyperlipidemia Father     Social History   Tobacco Use  . Smoking status: Former Smoker    Packs/day: 1.00    Years: 30.00    Pack years: 30.00    Types: Cigarettes  . Smokeless tobacco: Never Used  . Tobacco comment: qiot 50 yrs ago  Vaping Use  . Vaping Use: Never used  Substance Use Topics  . Alcohol use: Not Currently  . Drug use: No    Home Medications Prior to Admission medications   Medication Sig Start Date End Date Taking?  Authorizing Provider  acetaminophen (TYLENOL) 500 MG tablet Take 2,000 mg by mouth daily as needed for mild pain or headache.     [provider]  bismuth subsalicylate (PEPTO BISMOL) 262 MG/15ML suspension Take 30 mLs by mouth every 6 (six) hours as needed for indigestion or diarrhea or loose stools.    [provider]  brimonidine (ALPHAGAN) 0.2 % ophthalmic solution Place 1 drop into both eyes 2 (two) times daily.    [provider]  cephALEXin (KEFLEX) 500 MG capsule Take 1 capsule (500 mg total) by mouth 3 (three) times daily. 75/64/33   Delora Fuel, MD  Cholecalciferol (VITAMIN D3) 50 MCG (2000 UT) TABS Take 2,000 Units by mouth daily.    [provider]  diltiazem (TIAZAC) 180 MG 24 hr capsule Take 180 mg by mouth daily. 07/26/19   [provider]  ELIQUIS 5 MG TABS tablet Take 5 mg by mouth 2 (two) times daily. 08/02/19   [provider]  famotidine (PEPCID) 40 MG tablet Take 40 mg by mouth at bedtime.  08/20/19   [provider]  ferrous sulfate 325 (65 FE) MG tablet Take 325 mg by mouth 2 (two) times daily.    [provider]  furosemide (LASIX) 20 MG tablet Take 20 mg by mouth daily.    [provider]  irbesartan-hydrochlorothiazide (AVALIDE) 300-12.5 MG tablet Take 1 tablet by mouth daily. 01/09/20   [provider]  latanoprost (XALATAN) 0.005 % ophthalmic solution Place 1 drop into both eyes at bedtime.    [provider]  levothyroxine (SYNTHROID, LEVOTHROID) 88 MCG tablet Take 88 mcg by mouth daily before breakfast.    [provider]  metFORMIN (GLUCOPHAGE) 500 MG tablet Take 500 mg by mouth daily.  06/11/19   [provider]  metoprolol succinate (TOPROL-XL) 25 MG 24 hr tablet Take 3 tablets (75 mg total) by mouth 2 (two) times daily. 08/27/19 01/14/20  Elodia Florence., MD  omeprazole (PRILOSEC) 40 MG capsule Take 40 mg by mouth daily.    [provider]    Polyethyl Glycol-Propyl Glycol (SYSTANE) 0.4-0.3 % GEL ophthalmic gel Place 1 application into both eyes 4 (four) times daily as needed (Dry eyes).     [provider]  potassium chloride SA (KLOR-CON) 20 MEQ tablet Take one tablet twice a day for ten days, then take one table daily 29/51/88   Delora Fuel, MD  pramipexole (MIRAPEX) 1 MG tablet Take 1 mg by mouth at bedtime.     [provider]  simvastatin (ZOCOR) 20 MG tablet Take 20  mg by mouth every evening.    [provider]  timolol (BETIMOL) 0.25 % ophthalmic solution Place 1 drop into both eyes 2 (two) times daily.    [provider]  triamcinolone ointment (KENALOG) 0.1 % Apply 1 application topically 2 (two) times daily as needed (skin irritation.).    [provider]  vitamin C (ASCORBIC ACID) 500 MG tablet Take 500 mg by mouth 2 (two) times daily.    [provider]    Allergies    Arthrotec [diclofenac-misoprostol], Doxycycline, Latex, Amlodipine, Celebrex [celecoxib], Vancomycin, Diclofenac, and Shellfish allergy  Review of Systems   Review of Systems  Musculoskeletal: Positive for arthralgias and back pain.  Hematological: Bruises/bleeds easily.  All other systems reviewed and are negative.   Physical Exam Updated Vital Signs BP 112/63   Pulse (!) 103   Temp 98.1 F (36.7 C) (Oral)   Resp (!) 21   SpO2 95%   Physical Exam Vitals and nursing note reviewed.  Constitutional:      General: She is not in acute distress.    Appearance: She is well-developed.     Comments: NAD.  HENT:     Head: Normocephalic and atraumatic.     Right Ear: External ear normal.     Left Ear: External ear normal.     Nose: Nose normal.  Eyes:     Conjunctiva/sclera: Conjunctivae normal.  Cardiovascular:     Rate and Rhythm: Normal rate and regular rhythm.     Heart sounds: Normal heart sounds.     Comments: Sounds regular HR < 100 during exam. No significant edema in LE. 1+  radial and DP pulses bilaterally.  Pulmonary:     Effort: Pulmonary effort is normal.     Breath sounds: Normal breath sounds.  Abdominal:     Palpations: Abdomen is soft.     Tenderness: There is no abdominal tenderness.  Musculoskeletal:        General: Tenderness present. No deformity. Normal range of motion.     Cervical back: Normal range of motion and neck supple.     Comments: Patient able to pull herself up and sit up on bed. No midline or paraspinal TL spine tenderness. Skin normal over the back  Mild TTP left inguinal crease/anterior hip. Decreased ROM of left hip due to pain specially with IR/ER and flexion. Patient cannot lift left leg off bed. No focal tenderness over left femur, knee. No obvious left leg rotation or shortening.   Skin:    General: Skin is warm and dry.     Capillary Refill: Capillary refill takes less than 2 seconds.     Findings: Erythema present.     Comments: Chronic venous changes noted in LE bilaterally. LLE slightly edematous, erythema noted circumferentially and up to knee. Diffuse tenderness over erythema. No focal calf tenderness. Thickened darkened toe nails. Macerated, thickened white skin in between all toes.   Neurological:     Mental Status: She is alert and oriented to person, place, and time.     Comments: Sensation in LE intact. Patient able to lift and hold RLE, unable to lift and hold LLE due to pain in left hip. 5/5 strength at ankle.   Psychiatric:        Behavior: Behavior normal.        Thought Content: Thought content normal.        Judgment: Judgment normal.       ED Results / Procedures / Treatments  Labs (all labs ordered are listed, but only abnormal results are displayed) Labs Reviewed  CBC WITH DIFFERENTIAL/PLATELET - Abnormal; Notable for the following components:      Result Value   WBC 11.0 (*)    Neutro Abs 8.1 (*)    Monocytes Absolute 1.4 (*)    All other components within normal limits  BASIC METABOLIC PANEL  - Abnormal; Notable for the following components:   Glucose, Bld 114 (*)    BUN 44 (*)    Creatinine, Ser 1.37 (*)    GFR, Estimated 37 (*)    All other components within normal limits  URINALYSIS, ROUTINE W REFLEX MICROSCOPIC    EKG None  Radiology DG Lumbar Spine Complete  Result Date: 01/18/2020 CLINICAL DATA:  Acute low back pain EXAM: LUMBAR SPINE - COMPLETE 4+ VIEW COMPARISON:  08/24/2019 FINDINGS: There is no evidence of lumbar spine fracture. Vertebral body heights appear maintained. Unchanged alignment including grade 1 anterolisthesis of L4 on L5 and trace retrolisthesis of L1 on L2. Similar facet predominant degenerative changes of the lower lumbar spine partially visualized right hip arthroplasty. Severe, end-stage osteoarthritis of the left hip. IMPRESSION: 1. No evidence of acute fracture or traumatic listhesis of the lumbar spine. 2. Severe, end-stage osteoarthritis of the left hip. Electronically Signed   By: Davina Poke D.O.   On: 01/18/2020 18:37   CT Hip Left Wo Contrast  Result Date: 01/18/2020 CLINICAL DATA:  Acute on chronic left hip pain, concern for occult fracture. EXAM: CT OF THE LEFT HIP WITHOUT CONTRAST TECHNIQUE: Multidetector CT imaging of the left hip was performed according to the standard protocol. Multiplanar CT image reconstructions were also generated. COMPARISON:  Same day hip radiograph. FINDINGS: Bones/Joint/Cartilage No acute osseous injury is identified. There is no left hip joint dislocation or joint effusion. There are severe degenerative changes of the left hip with cyst formation. Ligaments Suboptimally assessed by CT. Muscles and Tendons No abnormality noted. Soft tissues Visible portions of the pelvis and abdomen appear normal. Multiple left inguinal lymph nodes are noted, measuring up to 8 mm in short axis. Vascular calcifications are seen in the left common femoral artery. IMPRESSION: 1. No acute osseous abnormality. 2. Severe degenerative  changes of the left hip. Electronically Signed   By: Zerita Boers M.D.   On: 01/18/2020 20:12   DG Hip Unilat W or Wo Pelvis 2-3 Views Left  Result Date: 01/18/2020 CLINICAL DATA:  Worsening left hip pain. EXAM: DG HIP (WITH OR WITHOUT PELVIS) 2-3V LEFT COMPARISON:  None. FINDINGS: There is no evidence of fracture or dislocation. Advanced osteoarthritic changes of the left hip with bone-on-bone contact, subchondral sclerosis, subchondral cysts and osteophyte formation. Post right hip arthroplasty. Intact hardware. IMPRESSION: Advanced osteoarthritic changes of the left hip. Electronically Signed   By: Fidela Salisbury M.D.   On: 01/18/2020 18:36    Procedures Procedures (including critical care time)  Medications Ordered in ED Medications  0.9 %  sodium chloride infusion (has no administration in time range)  oxyCODONE-acetaminophen (PERCOCET/ROXICET) 5-325 MG per tablet 2 tablet (2 tablets Oral Given 01/18/20 1944)    ED Course  I have reviewed the triage vital signs and the nursing notes.  Pertinent labs & imaging results that were available during my care of the patient were reviewed by me and considered in my medical decision making (see chart for details).  Clinical Course as of Jan 17 2214  Sat Jan 18, 2020  1856 10.6 yesterday   WBC(!): 11.0 [  CG]  1856 1.36 yesterday  Creatinine(!): 1.37 [CG]  1856 Improved from yesterday   Potassium: 3.5 [CG]  2212  IMPRESSION: Advanced osteoarthritic changes of the left hip.    DG Hip Unilat W or Wo Pelvis 2-3 Views Left [CG]  2212 IMPRESSION: 1. No evidence of acute fracture or traumatic listhesis of the lumbar spine. 2. Severe, end-stage osteoarthritis of the left hip.    DG Lumbar Spine Complete [CG]  2213  IMPRESSION: 1. No acute osseous abnormality. 2. Severe degenerative changes of the left hip.    CT Hip Left Wo Contrast [CG]    Clinical Course User Index [CG] Kinnie Feil, PA-C   MDM  Rules/Calculators/A&P                          86 year old female presents to the ED for evaluation of acute on chronic left hip pain and new back pain since yesterday.  No falls.  She lives alone.  Daughter concerned about degree of pain, safety given age.  EMR, triage or nurse notes reviewed to assist with history and MDM.  Scheduled total hip arthroplasty by Dr. Wynelle Link 12/1.  Seen in the ED yesterday for increased redness and pain in her leg.  Provider suspected either venous stasis versus cellulitis.  Discharged with Keflex.  On exam patient is afebrile.  Left lower extremity appears cellulitic, unchanged from photo yesterday in the ED.  Has significant pain with any movement of the left leg/hip.  Labs ordered: CBC, BMP, urinalysis.  Imaging ordered: Left hip and lumbar spine x-ray.  ER work-up personally visualized and interpreted  Lab work reviewed reveals-mild elevation in creatinine 1.37, last was 0.8.  Potassium normal.  Mild leukocytosis WBC 11.0.  Imaging reviewed reveals-severe bone-on-bone degenerative changes/arthritis of the left hip.  No acute abnormalities on lumbar x-ray.  Given continued pain, CT of the hip was obtained to rule out occult fracture.  Medicines ordered: Percocet x2.  2015: CT scan does not reveal any occult fracture.  Patient reevaluated and quite somnolent after Percocet but still able to participate with exam.  I personally attempted to ambulate patient but she was very unsteady secondary to pain.  Patient lives alone, elderly.  Family is concerned about safety at home.  Will discuss with hospitalist for admission for continued pain control, PT evaluation, possibly Ortho consult, cellulitis, mild AKI.  Discussed with EDP.  NS infusion ordered.  Final Clinical Impression(s) / ED Diagnoses Final diagnoses:  Left hip pain  Unsteady gait  AKI (acute kidney injury) ([place])  Cellulitis of left lower extremity    Rx / DC Orders ED Discharge Orders     None       Arlean Hopping 01/18/20 2215    Davonna Belling, MD 01/18/20 2318

## 2020-01-19 ENCOUNTER — Encounter (HOSPITAL_COMMUNITY): Payer: Self-pay | Admitting: Internal Medicine

## 2020-01-19 ENCOUNTER — Observation Stay (HOSPITAL_COMMUNITY): Payer: Medicare Other

## 2020-01-19 DIAGNOSIS — M7989 Other specified soft tissue disorders: Secondary | ICD-10-CM

## 2020-01-19 DIAGNOSIS — Z91013 Allergy to seafood: Secondary | ICD-10-CM | POA: Diagnosis not present

## 2020-01-19 DIAGNOSIS — M79609 Pain in unspecified limb: Secondary | ICD-10-CM | POA: Diagnosis not present

## 2020-01-19 DIAGNOSIS — E876 Hypokalemia: Secondary | ICD-10-CM | POA: Diagnosis present

## 2020-01-19 DIAGNOSIS — Z85828 Personal history of other malignant neoplasm of skin: Secondary | ICD-10-CM | POA: Diagnosis not present

## 2020-01-19 DIAGNOSIS — L03116 Cellulitis of left lower limb: Secondary | ICD-10-CM | POA: Diagnosis present

## 2020-01-19 DIAGNOSIS — L538 Other specified erythematous conditions: Secondary | ICD-10-CM | POA: Diagnosis not present

## 2020-01-19 DIAGNOSIS — M1612 Unilateral primary osteoarthritis, left hip: Principal | ICD-10-CM

## 2020-01-19 DIAGNOSIS — K219 Gastro-esophageal reflux disease without esophagitis: Secondary | ICD-10-CM

## 2020-01-19 DIAGNOSIS — E785 Hyperlipidemia, unspecified: Secondary | ICD-10-CM | POA: Diagnosis present

## 2020-01-19 DIAGNOSIS — R2681 Unsteadiness on feet: Secondary | ICD-10-CM | POA: Diagnosis present

## 2020-01-19 DIAGNOSIS — Z96653 Presence of artificial knee joint, bilateral: Secondary | ICD-10-CM | POA: Diagnosis present

## 2020-01-19 DIAGNOSIS — E119 Type 2 diabetes mellitus without complications: Secondary | ICD-10-CM | POA: Diagnosis present

## 2020-01-19 DIAGNOSIS — E860 Dehydration: Secondary | ICD-10-CM | POA: Diagnosis present

## 2020-01-19 DIAGNOSIS — G2581 Restless legs syndrome: Secondary | ICD-10-CM | POA: Diagnosis present

## 2020-01-19 DIAGNOSIS — I959 Hypotension, unspecified: Secondary | ICD-10-CM | POA: Diagnosis present

## 2020-01-19 DIAGNOSIS — G8929 Other chronic pain: Secondary | ICD-10-CM | POA: Diagnosis present

## 2020-01-19 DIAGNOSIS — I480 Paroxysmal atrial fibrillation: Secondary | ICD-10-CM | POA: Diagnosis present

## 2020-01-19 DIAGNOSIS — Z7901 Long term (current) use of anticoagulants: Secondary | ICD-10-CM | POA: Diagnosis not present

## 2020-01-19 DIAGNOSIS — E669 Obesity, unspecified: Secondary | ICD-10-CM | POA: Diagnosis present

## 2020-01-19 DIAGNOSIS — H409 Unspecified glaucoma: Secondary | ICD-10-CM | POA: Diagnosis present

## 2020-01-19 DIAGNOSIS — T502X5A Adverse effect of carbonic-anhydrase inhibitors, benzothiadiazides and other diuretics, initial encounter: Secondary | ICD-10-CM | POA: Diagnosis present

## 2020-01-19 DIAGNOSIS — E039 Hypothyroidism, unspecified: Secondary | ICD-10-CM | POA: Diagnosis present

## 2020-01-19 DIAGNOSIS — Z66 Do not resuscitate: Secondary | ICD-10-CM | POA: Diagnosis present

## 2020-01-19 DIAGNOSIS — Z20822 Contact with and (suspected) exposure to covid-19: Secondary | ICD-10-CM | POA: Diagnosis present

## 2020-01-19 DIAGNOSIS — I9589 Other hypotension: Secondary | ICD-10-CM

## 2020-01-19 DIAGNOSIS — L02416 Cutaneous abscess of left lower limb: Secondary | ICD-10-CM | POA: Diagnosis present

## 2020-01-19 DIAGNOSIS — L0390 Cellulitis, unspecified: Secondary | ICD-10-CM

## 2020-01-19 DIAGNOSIS — I4891 Unspecified atrial fibrillation: Secondary | ICD-10-CM | POA: Diagnosis present

## 2020-01-19 DIAGNOSIS — N179 Acute kidney failure, unspecified: Secondary | ICD-10-CM | POA: Diagnosis not present

## 2020-01-19 DIAGNOSIS — M47816 Spondylosis without myelopathy or radiculopathy, lumbar region: Secondary | ICD-10-CM | POA: Diagnosis present

## 2020-01-19 DIAGNOSIS — E861 Hypovolemia: Secondary | ICD-10-CM

## 2020-01-19 DIAGNOSIS — I10 Essential (primary) hypertension: Secondary | ICD-10-CM | POA: Diagnosis present

## 2020-01-19 HISTORY — DX: Hypotension, unspecified: I95.9

## 2020-01-19 HISTORY — DX: Hypothyroidism, unspecified: E03.9

## 2020-01-19 LAB — BASIC METABOLIC PANEL
Anion gap: 8 (ref 5–15)
BUN: 30 mg/dL — ABNORMAL HIGH (ref 8–23)
CO2: 26 mmol/L (ref 22–32)
Calcium: 8.5 mg/dL — ABNORMAL LOW (ref 8.9–10.3)
Chloride: 105 mmol/L (ref 98–111)
Creatinine, Ser: 1.05 mg/dL — ABNORMAL HIGH (ref 0.44–1.00)
GFR, Estimated: 51 mL/min — ABNORMAL LOW (ref >60)
Glucose, Bld: 141 mg/dL — ABNORMAL HIGH (ref 70–99)
Potassium: 3.5 mmol/L (ref 3.5–5.1)
Sodium: 139 mmol/L (ref 135–145)

## 2020-01-19 LAB — CBC
HCT: 32.5 % — ABNORMAL LOW (ref 36.0–46.0)
Hemoglobin: 10.6 g/dL — ABNORMAL LOW (ref 12.0–15.0)
MCH: 31.7 pg (ref 26.0–34.0)
MCHC: 32.6 g/dL (ref 30.0–36.0)
MCV: 97.3 fL (ref 80.0–100.0)
Platelets: 282 10*3/uL (ref 150–400)
RBC: 3.34 MIL/uL — ABNORMAL LOW (ref 3.87–5.11)
RDW: 13 % (ref 11.5–15.5)
WBC: 9 10*3/uL (ref 4.0–10.5)
nRBC: 0 % (ref 0.0–0.2)

## 2020-01-19 LAB — URINALYSIS, ROUTINE W REFLEX MICROSCOPIC
Bacteria, UA: NONE SEEN
Bilirubin Urine: NEGATIVE
Glucose, UA: NEGATIVE mg/dL
Ketones, ur: NEGATIVE mg/dL
Leukocytes,Ua: NEGATIVE
Nitrite: NEGATIVE
Protein, ur: NEGATIVE mg/dL
Specific Gravity, Urine: 1.016 (ref 1.005–1.030)
pH: 5 (ref 5.0–8.0)

## 2020-01-19 LAB — RESP PANEL BY RT-PCR (FLU A&B, COVID) ARPGX2
Influenza A by PCR: NEGATIVE
Influenza B by PCR: NEGATIVE
SARS Coronavirus 2 by RT PCR: NEGATIVE

## 2020-01-19 MED ORDER — ASCORBIC ACID 500 MG PO TABS
500.0000 mg | ORAL_TABLET | Freq: Two times a day (BID) | ORAL | Status: DC
  Administered 2020-01-19 – 2020-01-21 (×6): 500 mg via ORAL
  Filled 2020-01-19 (×6): qty 1

## 2020-01-19 MED ORDER — BRIMONIDINE TARTRATE 0.2 % OP SOLN
1.0000 [drp] | Freq: Two times a day (BID) | OPHTHALMIC | Status: DC
  Administered 2020-01-19 – 2020-01-26 (×16): 1 [drp] via OPHTHALMIC
  Filled 2020-01-19: qty 5

## 2020-01-19 MED ORDER — IRBESARTAN 300 MG PO TABS: 300.0000 mg | ORAL_TABLET | Freq: Every day | ORAL | Status: DC

## 2020-01-19 MED ORDER — PRAMIPEXOLE DIHYDROCHLORIDE 1 MG PO TABS
1.0000 mg | ORAL_TABLET | Freq: Every day | ORAL | Status: DC
  Administered 2020-01-19 – 2020-01-26 (×8): 1 mg via ORAL
  Filled 2020-01-19 (×8): qty 1

## 2020-01-19 MED ORDER — TRIAMCINOLONE ACETONIDE 0.1 % EX OINT
1.0000 "application " | TOPICAL_OINTMENT | Freq: Two times a day (BID) | CUTANEOUS | Status: DC | PRN
  Filled 2020-01-19: qty 80

## 2020-01-19 MED ORDER — POTASSIUM CHLORIDE ER 10 MEQ PO TBCR
10.0000 meq | EXTENDED_RELEASE_TABLET | Freq: Every day | ORAL | Status: DC
  Administered 2020-01-19 – 2020-01-23 (×4): 10 meq via ORAL
  Filled 2020-01-19 (×9): qty 1

## 2020-01-19 MED ORDER — FAMOTIDINE 20 MG PO TABS
40.0000 mg | ORAL_TABLET | Freq: Every day | ORAL | Status: DC
  Administered 2020-01-19 – 2020-01-26 (×8): 40 mg via ORAL
  Filled 2020-01-19 (×8): qty 2

## 2020-01-19 MED ORDER — SIMVASTATIN 20 MG PO TABS
20.0000 mg | ORAL_TABLET | Freq: Every evening | ORAL | Status: DC
  Administered 2020-01-19 – 2020-01-26 (×7): 20 mg via ORAL
  Filled 2020-01-19 (×8): qty 1

## 2020-01-19 MED ORDER — TIMOLOL MALEATE 0.25 % OP SOLN
1.0000 [drp] | Freq: Two times a day (BID) | OPHTHALMIC | Status: DC
  Administered 2020-01-19 – 2020-01-26 (×15): 1 [drp] via OPHTHALMIC
  Filled 2020-01-19: qty 5

## 2020-01-19 MED ORDER — IRBESARTAN-HYDROCHLOROTHIAZIDE 300-12.5 MG PO TABS: 1.0000 | ORAL_TABLET | Freq: Every day | ORAL | Status: DC

## 2020-01-19 MED ORDER — ACETAMINOPHEN 325 MG PO TABS
325.0000 mg | ORAL_TABLET | Freq: Four times a day (QID) | ORAL | Status: DC | PRN
  Administered 2020-01-19: 325 mg via ORAL
  Filled 2020-01-19 (×2): qty 1

## 2020-01-19 MED ORDER — SODIUM CHLORIDE 0.9 % IV SOLN: INTRAVENOUS | Status: DC

## 2020-01-19 MED ORDER — SODIUM CHLORIDE 0.9 % IV BOLUS
250.0000 mL | Freq: Once | INTRAVENOUS | Status: AC
  Administered 2020-01-19: 250 mL via INTRAVENOUS

## 2020-01-19 MED ORDER — FERROUS SULFATE 325 (65 FE) MG PO TABS
325.0000 mg | ORAL_TABLET | Freq: Two times a day (BID) | ORAL | Status: DC
  Administered 2020-01-19 (×2): 325 mg via ORAL
  Filled 2020-01-19 (×3): qty 1

## 2020-01-19 MED ORDER — HYDROCHLOROTHIAZIDE 12.5 MG PO CAPS: 12.5000 mg | ORAL_CAPSULE | Freq: Every day | ORAL | Status: DC

## 2020-01-19 MED ORDER — LATANOPROST 0.005 % OP SOLN
1.0000 [drp] | Freq: Every day | OPHTHALMIC | Status: DC
  Administered 2020-01-19 – 2020-01-26 (×8): 1 [drp] via OPHTHALMIC
  Filled 2020-01-19: qty 2.5

## 2020-01-19 MED ORDER — ACETAMINOPHEN 500 MG PO TABS
500.0000 mg | ORAL_TABLET | Freq: Three times a day (TID) | ORAL | Status: DC
  Administered 2020-01-19 – 2020-01-21 (×9): 500 mg via ORAL
  Filled 2020-01-19 (×9): qty 1

## 2020-01-19 MED ORDER — SODIUM CHLORIDE 0.9 % IV SOLN: Freq: Once | INTRAVENOUS | Status: AC

## 2020-01-19 MED ORDER — PANTOPRAZOLE SODIUM 40 MG PO TBEC
80.0000 mg | DELAYED_RELEASE_TABLET | Freq: Every day | ORAL | Status: DC
  Administered 2020-01-19 – 2020-01-27 (×8): 80 mg via ORAL
  Filled 2020-01-19 (×8): qty 2

## 2020-01-19 MED ORDER — ONDANSETRON HCL 4 MG/2ML IJ SOLN: 4.0000 mg | Freq: Four times a day (QID) | INTRAMUSCULAR | Status: DC | PRN

## 2020-01-19 MED ORDER — OXYCODONE HCL 5 MG PO TABS
5.0000 mg | ORAL_TABLET | Freq: Four times a day (QID) | ORAL | Status: DC | PRN
  Administered 2020-01-21 – 2020-01-22 (×3): 5 mg via ORAL
  Filled 2020-01-19 (×3): qty 1

## 2020-01-19 MED ORDER — METOPROLOL SUCCINATE ER 50 MG PO TB24: 75.0000 mg | ORAL_TABLET | Freq: Two times a day (BID) | ORAL | Status: DC

## 2020-01-19 MED ORDER — SODIUM CHLORIDE 0.9 % IV SOLN
1.0000 g | INTRAVENOUS | Status: DC
  Administered 2020-01-19 – 2020-01-22 (×4): 1 g via INTRAVENOUS
  Filled 2020-01-19: qty 10
  Filled 2020-01-19 (×3): qty 1

## 2020-01-19 MED ORDER — ACETAMINOPHEN 650 MG RE SUPP: 325.0000 mg | Freq: Four times a day (QID) | RECTAL | Status: DC | PRN

## 2020-01-19 MED ORDER — POLYVINYL ALCOHOL 1.4 % OP SOLN
1.0000 [drp] | Freq: Four times a day (QID) | OPHTHALMIC | Status: DC | PRN
  Filled 2020-01-19: qty 15

## 2020-01-19 MED ORDER — LACTATED RINGERS IV BOLUS
1000.0000 mL | Freq: Once | INTRAVENOUS | Status: AC
  Administered 2020-01-19: 1000 mL via INTRAVENOUS

## 2020-01-19 MED ORDER — APIXABAN 5 MG PO TABS
5.0000 mg | ORAL_TABLET | Freq: Two times a day (BID) | ORAL | Status: DC
  Administered 2020-01-19 (×2): 5 mg via ORAL
  Filled 2020-01-19 (×3): qty 1

## 2020-01-19 MED ORDER — LEVOTHYROXINE SODIUM 88 MCG PO TABS
88.0000 ug | ORAL_TABLET | Freq: Every day | ORAL | Status: DC
  Administered 2020-01-19 – 2020-01-27 (×8): 88 ug via ORAL
  Filled 2020-01-19 (×8): qty 1

## 2020-01-19 MED ORDER — FUROSEMIDE 40 MG PO TABS: 20.0000 mg | ORAL_TABLET | Freq: Every day | ORAL | Status: DC

## 2020-01-19 MED ORDER — VITAMIN D3 25 MCG (1000 UNIT) PO TABS
2000.0000 [IU] | ORAL_TABLET | Freq: Every day | ORAL | Status: DC
  Administered 2020-01-19 – 2020-01-21 (×3): 2000 [IU] via ORAL
  Filled 2020-01-19 (×3): qty 2

## 2020-01-19 MED ORDER — ONDANSETRON HCL 4 MG PO TABS: 4.0000 mg | ORAL_TABLET | Freq: Four times a day (QID) | ORAL | Status: DC | PRN

## 2020-01-19 NOTE — Progress Notes (Signed)
Lower extremity venous bilateral study completed.   Please see CV Proc for preliminary results.   Clarke Peretz, RDMS  

## 2020-01-19 NOTE — Progress Notes (Signed)
PROGRESS NOTE    Dana Bradford  UMP:[id number] DOB: 1930/04/06 DOA: 01/18/2020 PCP: Jonathon Resides, MD    Chief Complaint  Patient presents with  . Back Pain    Brief Narrative:  HPI per Dr. Tobie Poet H&P obtained from daughter because patient is sleeping status post pain medication  HPI: Dana Bradford is a 86 y.o. female with medical history significant for afib on eliquis 5 mg BID 75 mg BID, hypertension, hypothyroid, presented to the emergency department for chief concerns of left hip pain.  Patient has chronic back pain and it worsened after presentation to ED at [facility]. She was discharged home with treatments for cellulitis.  While being carried from her wheelchair to the car, per daughter states that she was deposited heavily and it resulted in worsening of her left hip.  Patient denies fever, chest pain, shortness of breath, abdominal pain.  Allergies: amlodipine causes angioedema   ED Course: Gust with ED provider, requesting admission for pain control of left hip   Assessment & Plan:   Principal Problem:   Left leg cellulitis Active Problems:   Essential hypertension, benign   GERD (gastroesophageal reflux disease)   OA (osteoarthritis) of hip   Dehydration   Hypotension   Hypothyroidism   Hyperlipidemia   AF (paroxysmal atrial fibrillation) ([place])  #1 left lower extremity cellulitis Patient with a left lower extremity cellulitis with swelling and erythema.  Lower extremity Dopplers done negative for DVT.  Plain films of left hip and pelvis negative for any acute fracture.  Plain films of the L-spine with severe end-stage osteoarthritis of the left hip.  CT left hip with no acute osseous abnormality, severe degenerative changes of the left hip.  Continue IV Rocephin and if continued clinical improvement could transition to oral antibiotics in the next 24 hours.  Supportive care.  2.  Hypotension Likely secondary to hypovolemia.  Patient afebrile.   Patient with a leukocytosis on admission that has improved and normalized.  Patient with no overt bleeding.  Patient with no chest pain or no concern for ischemia.  Patient with elevated BUN on admission which is trending down pointing towards dehydration.  Gave a 250 cc normal saline bolus.  Placed on normal saline at 100 cc/h.  Check a random cortisol level in the morning.  Follow.  3.  Hypothyroidism Continue home dose Synthroid.  4.  Advanced osteoarthritic changes of the left hip Patient being followed by orthopedic surgery in the outpatient setting and scheduled for outpatient surgery on 01/23/2019.  5.  Hypertension Continue to hold oral antihypertensive medications secondary to problem #2.  6.  Hyperlipidemia Continue statin.  7.  Paroxysmal atrial fibrillation Currently rate controlled.  Continue Eliquis for anticoagulation.  8.  Dehydration Gentle hydration.  9.  GERD PPI.   DVT prophylaxis: Eliquis Code Status: DNR Family Communication: Updated patient.  No family at bedside. Disposition:   Status is: Observation    Dispo: The patient is from: Home              Anticipated d/c is to: Home              Anticipated d/c date is: 1 to 2 days.              Patient currently with hypotension with systolic blood pressures in the 80s, receiving IV fluids, on IV antibiotics.  Not stable for discharge.       Consultants:   None  Procedures:  Lower extremity  Dopplers 01/19/2020  CT left hip 01/18/2020  Plain films of the left hip and pelvis 01/18/2020  Plain films of the L-spine 01/18/2020  Antimicrobials:   IV Rocephin 01/19/2020   Subjective: Patient sitting up in bed.  Patient noted to be hypotensive with systolic blood pressures in the 80s.  Patient denies any dizziness.  No syncopal episodes.  No chest pain.  No shortness of breath.  Feeling better than on admission.  States has not moved around to assess pain.  No bleeding  noted.  Objective: Vitals:   01/19/20 1123 01/19/20 1224 01/19/20 1300 01/19/20 1436  BP: (!) 90/57 (!) 86/50 (!) 83/47 (!) 98/54  Pulse: 61 90  80  Resp: 16     Temp: 98.3 F (36.8 C)     TempSrc: Oral     SpO2: 94%     Weight:      Height:        Intake/Output Summary (Last 24 hours) at 01/19/2020 1536 Last data filed at 01/19/2020 0320 Gross per 24 hour  Intake 2376.13 ml  Output --  Net 2376.13 ml   Filed Weights   01/19/20 0315  Weight: 69.3 kg    Examination:  General exam: Appears calm and comfortable  Respiratory system: Clear to auscultation anterior lung fields. Respiratory effort normal. Cardiovascular system: S1 & S2 heard, RRR. No JVD, murmurs, rubs, gallops or clicks. No pedal edema. Gastrointestinal system: Abdomen is nondistended, soft and nontender. No organomegaly or masses felt. Normal bowel sounds heard. Central nervous system: Alert and oriented. No focal neurological deficits. Extremities: Left lower extremity with some slight erythema, symmetric 5 x 5 power. Skin: No rashes, lesions or ulcers Psychiatry: Judgement and insight appear normal. Mood & affect appropriate.     Data Reviewed: I have personally reviewed following labs and imaging studies  CBC: Recent Labs  Lab 01/18/20 0215 01/18/20 1742 01/19/20 0621  WBC 10.6* 11.0* 9.0  NEUTROABS 6.3 8.1*  --   HGB 13.1 12.5 10.6*  HCT 38.0 37.3 32.5*  MCV 94.5 96.1 97.3  PLT 298 297 706    Basic Metabolic Panel: Recent Labs  Lab 01/18/20 0215 01/18/20 1742 01/19/20 0621  NA 136 136 139  K 3.0* 3.5 3.5  CL 98 101 105  CO2 26 25 26   GLUCOSE 107* 114* 141*  BUN 40* 44* 30*  CREATININE 1.36* 1.37* 1.05*  CALCIUM 9.1 9.1 8.5*    GFR: Estimated Creatinine Clearance: 31.2 mL/min (A) (by C-G formula based on SCr of 1.05 mg/dL (H)).  Liver Function Tests: No results for input(s): AST, ALT, ALKPHOS, BILITOT, PROT, ALBUMIN in the last 168 hours.  CBG: Recent Labs  Lab  01/15/20 1503  GLUCAP 101*     Recent Results (from the past 240 hour(s))  Surgical pcr screen     Status: None   Collection Time: 01/15/20  3:20 PM   Specimen: Nasal Mucosa; Nasal Swab  Result Value Ref Range Status   MRSA, PCR NEGATIVE NEGATIVE Final   Staphylococcus aureus NEGATIVE NEGATIVE Final    Comment: (NOTE) The Xpert SA Assay (FDA approved for NASAL specimens in patients 51 years of age and older), is one component of a comprehensive surveillance program. It is not intended to diagnose infection nor to guide or monitor treatment. Performed at [facility], [place] [address], [place], [place] [address]   Resp Panel by RT-PCR (Flu A&B, Covid) Nasopharyngeal Swab     Status: None   Collection Time: 01/19/20  1:11  AM   Specimen: Nasopharyngeal Swab; Nasopharyngeal(NP) swabs in vial transport medium  Result Value Ref Range Status   SARS Coronavirus 2 by RT PCR NEGATIVE NEGATIVE Final    Comment: (NOTE) SARS-CoV-2 target nucleic acids are NOT DETECTED.  The SARS-CoV-2 RNA is generally detectable in upper respiratory specimens during the acute phase of infection. The lowest concentration of SARS-CoV-2 viral copies this assay can detect is 138 copies/mL. A negative result does not preclude SARS-Cov-2 infection and should not be used as the sole basis for treatment or other patient management decisions. A negative result may occur with  improper specimen collection/handling, submission of specimen other than nasopharyngeal swab, presence of viral mutation(s) within the areas targeted by this assay, and inadequate number of viral copies(<138 copies/mL). A negative result must be combined with clinical observations, patient history, and epidemiological information. The expected result is Negative.  Fact Sheet for Patients:  EntrepreneurPulse.com.au  Fact Sheet for Healthcare Providers:  IncredibleEmployment.be  This  test is no t yet approved or cleared by the [place] FDA and  has been authorized for detection and/or diagnosis of SARS-CoV-2 by FDA under an Emergency Use Authorization (EUA). This EUA will remain  in effect (meaning this test can be used) for the duration of the COVID-19 declaration under Section 564(b)(1) of the Act, 21 U.S.C.section 360bbb-3(b)(1), unless the authorization is terminated  or revoked sooner.       Influenza A by PCR NEGATIVE NEGATIVE Final   Influenza B by PCR NEGATIVE NEGATIVE Final    Comment: (NOTE) The Xpert Xpress SARS-CoV-2/FLU/RSV plus assay is intended as an aid in the diagnosis of influenza from Nasopharyngeal swab specimens and should not be used as a sole basis for treatment. Nasal washings and aspirates are unacceptable for Xpert Xpress SARS-CoV-2/FLU/RSV testing.  Fact Sheet for Patients: EntrepreneurPulse.com.au  Fact Sheet for Healthcare Providers: IncredibleEmployment.be  This test is not yet approved or cleared by the [place] FDA and has been authorized for detection and/or diagnosis of SARS-CoV-2 by FDA under an Emergency Use Authorization (EUA). This EUA will remain in effect (meaning this test can be used) for the duration of the COVID-19 declaration under Section 564(b)(1) of the Act, 21 U.S.C. section 360bbb-3(b)(1), unless the authorization is terminated or revoked.  Performed at [facility], [place] [address] [place], [place] [address]          Radiology Studies: DG Lumbar Spine Complete  Result Date: 01/18/2020 CLINICAL DATA:  Acute low back pain EXAM: LUMBAR SPINE - COMPLETE 4+ VIEW COMPARISON:  08/24/2019 FINDINGS: There is no evidence of lumbar spine fracture. Vertebral body heights appear maintained. Unchanged alignment including grade 1 anterolisthesis of L4 on L5 and trace retrolisthesis of L1 on L2. Similar facet predominant degenerative changes of the  lower lumbar spine partially visualized right hip arthroplasty. Severe, end-stage osteoarthritis of the left hip. IMPRESSION: 1. No evidence of acute fracture or traumatic listhesis of the lumbar spine. 2. Severe, end-stage osteoarthritis of the left hip. Electronically Signed   By: Davina Poke D.O.   On: 01/18/2020 18:37   CT Hip Left Wo Contrast  Result Date: 01/18/2020 CLINICAL DATA:  Acute on chronic left hip pain, concern for occult fracture. EXAM: CT OF THE LEFT HIP WITHOUT CONTRAST TECHNIQUE: Multidetector CT imaging of the left hip was performed according to the standard protocol. Multiplanar CT image reconstructions were also generated. COMPARISON:  Same day hip radiograph. FINDINGS: Bones/Joint/Cartilage No acute osseous injury is identified. There is no left  hip joint dislocation or joint effusion. There are severe degenerative changes of the left hip with cyst formation. Ligaments Suboptimally assessed by CT. Muscles and Tendons No abnormality noted. Soft tissues Visible portions of the pelvis and abdomen appear normal. Multiple left inguinal lymph nodes are noted, measuring up to 8 mm in short axis. Vascular calcifications are seen in the left common femoral artery. IMPRESSION: 1. No acute osseous abnormality. 2. Severe degenerative changes of the left hip. Electronically Signed   By: Zerita Boers M.D.   On: 01/18/2020 20:12   DG Hip Unilat W or Wo Pelvis 2-3 Views Left  Result Date: 01/18/2020 CLINICAL DATA:  Worsening left hip pain. EXAM: DG HIP (WITH OR WITHOUT PELVIS) 2-3V LEFT COMPARISON:  None. FINDINGS: There is no evidence of fracture or dislocation. Advanced osteoarthritic changes of the left hip with bone-on-bone contact, subchondral sclerosis, subchondral cysts and osteophyte formation. Post right hip arthroplasty. Intact hardware. IMPRESSION: Advanced osteoarthritic changes of the left hip. Electronically Signed   By: Fidela Salisbury M.D.   On: 01/18/2020 18:36   VAS [place]  LOWER EXTREMITY VENOUS (DVT)  Result Date: 01/19/2020  Lower Venous DVT Study Indications: LT hip pain, swelling, erythema, cellulitis.  Anticoagulation: Eliquis. Comparison Study: No prior studies. Performing Technologist: Darlin Coco, RDMS  Examination Guidelines: A complete evaluation includes B-mode imaging, spectral Doppler, color Doppler, and power Doppler as needed of all accessible portions of each vessel. Bilateral testing is considered an integral part of a complete examination. Limited examinations for reoccurring indications may be performed as noted. The reflux portion of the exam is performed with the patient in reverse Trendelenburg.  +---------+---------------+---------+-----------+----------+--------------+ RIGHT    CompressibilityPhasicitySpontaneityPropertiesThrombus Aging +---------+---------------+---------+-----------+----------+--------------+ CFV      Full           Yes      Yes                                 +---------+---------------+---------+-----------+----------+--------------+ SFJ      Full                                                        +---------+---------------+---------+-----------+----------+--------------+ FV Prox  Full                                                        +---------+---------------+---------+-----------+----------+--------------+ FV Mid   Full                                                        +---------+---------------+---------+-----------+----------+--------------+ FV DistalFull                                                        +---------+---------------+---------+-----------+----------+--------------+ PFV      Full                                                        +---------+---------------+---------+-----------+----------+--------------+  POP      Full           Yes      Yes                                  +---------+---------------+---------+-----------+----------+--------------+ PTV      Full                                                        +---------+---------------+---------+-----------+----------+--------------+ PERO     Full                                                        +---------+---------------+---------+-----------+----------+--------------+   +---------+---------------+---------+-----------+----------+--------------+ LEFT     CompressibilityPhasicitySpontaneityPropertiesThrombus Aging +---------+---------------+---------+-----------+----------+--------------+ CFV      Full           Yes      Yes                                 +---------+---------------+---------+-----------+----------+--------------+ SFJ      Full                                                        +---------+---------------+---------+-----------+----------+--------------+ FV Prox  Full                                                        +---------+---------------+---------+-----------+----------+--------------+ FV Mid   Full                                                        +---------+---------------+---------+-----------+----------+--------------+ FV DistalFull                                                        +---------+---------------+---------+-----------+----------+--------------+ PFV      Full                                                        +---------+---------------+---------+-----------+----------+--------------+ POP      Full           Yes      Yes                                 +---------+---------------+---------+-----------+----------+--------------+  PTV      Full                                                        +---------+---------------+---------+-----------+----------+--------------+ PERO     Full                                                         +---------+---------------+---------+-----------+----------+--------------+     Summary: RIGHT: - There is no evidence of deep vein thrombosis in the lower extremity.  - No cystic structure found in the popliteal fossa.  LEFT: - There is no evidence of deep vein thrombosis in the lower extremity.  - No cystic structure found in the popliteal fossa.  *See table(s) above for measurements and observations.    Preliminary         Scheduled Meds: . acetaminophen  500 mg Oral TID  . apixaban  5 mg Oral BID  . vitamin C  500 mg Oral BID  . brimonidine  1 drop Both Eyes BID  . cholecalciferol  2,000 Units Oral Daily  . famotidine  40 mg Oral QHS  . ferrous sulfate  325 mg Oral BID  . latanoprost  1 drop Both Eyes QHS  . levothyroxine  88 mcg Oral QAC breakfast  . pantoprazole  80 mg Oral Daily  . potassium chloride  10 mEq Oral Daily  . pramipexole  1 mg Oral QHS  . simvastatin  20 mg Oral QPM  . timolol  1 drop Both Eyes BID   Continuous Infusions: . sodium chloride 100 mL/hr at 01/19/20 1229  . cefTRIAXone (ROCEPHIN)  IV Stopped (01/19/20 0142)     LOS: 0 days    Time spent: 35 minutes    Irine Seal, MD [facility]   To contact the attending provider between 7A-7P or the covering provider during after hours 7P-7A, please log into the web site [email or web address] and access using universal [place] password for that web site. If you do not have the password, please call the hospital operator.  01/19/2020, 3:36 PM

## 2020-01-19 NOTE — Plan of Care (Addendum)
-   Admitted at 0310, AOx4 not in distress, asessment done, all needs attended, oriented on the use of call light and fall precaution, right leg ted hose applied. Denies pain at this time. Placed on telemetry, noted afib rate controlled.  Problem: Education: Goal: Knowledge of General Education information will improve Description: Including pain rating scale, medication(s)/side effects and non-pharmacologic comfort measures Outcome: Progressing   Problem: Health Behavior/Discharge Planning: Goal: Ability to manage health-related needs will improve Outcome: Progressing   Problem: Clinical Measurements: Goal: Ability to maintain clinical measurements within normal limits will improve Outcome: Progressing Goal: Will remain free from infection Outcome: Progressing Goal: Diagnostic test results will improve Outcome: Progressing Goal: Respiratory complications will improve Outcome: Progressing Goal: Cardiovascular complication will be avoided Outcome: Progressing   Problem: Activity: Goal: Risk for activity intolerance will decrease Outcome: Progressing   Problem: Nutrition: Goal: Adequate nutrition will be maintained Outcome: Progressing   Problem: Coping: Goal: Level of anxiety will decrease Outcome: Progressing   Problem: Elimination: Goal: Will not experience complications related to bowel motility Outcome: Progressing Goal: Will not experience complications related to urinary retention Outcome: Progressing   Problem: Pain Managment: Goal: General experience of comfort will improve Outcome: Progressing   Problem: Safety: Goal: Ability to remain free from injury will improve Outcome: Progressing   Problem: Skin Integrity: Goal: Risk for impaired skin integrity will decrease Outcome: Progressing

## 2020-01-20 DIAGNOSIS — I480 Paroxysmal atrial fibrillation: Secondary | ICD-10-CM | POA: Diagnosis not present

## 2020-01-20 DIAGNOSIS — E860 Dehydration: Secondary | ICD-10-CM | POA: Diagnosis not present

## 2020-01-20 DIAGNOSIS — L03116 Cellulitis of left lower limb: Secondary | ICD-10-CM | POA: Diagnosis not present

## 2020-01-20 DIAGNOSIS — I10 Essential (primary) hypertension: Secondary | ICD-10-CM | POA: Diagnosis not present

## 2020-01-20 LAB — BASIC METABOLIC PANEL
Anion gap: 9 (ref 5–15)
BUN: 19 mg/dL (ref 8–23)
CO2: 24 mmol/L (ref 22–32)
Calcium: 8.3 mg/dL — ABNORMAL LOW (ref 8.9–10.3)
Chloride: 106 mmol/L (ref 98–111)
Creatinine, Ser: 0.76 mg/dL (ref 0.44–1.00)
GFR, Estimated: >60 mL/min (ref >60)
Glucose, Bld: 98 mg/dL (ref 70–99)
Potassium: 3.8 mmol/L (ref 3.5–5.1)
Sodium: 139 mmol/L (ref 135–145)

## 2020-01-20 LAB — CBC WITH DIFFERENTIAL/PLATELET
Abs Immature Granulocytes: 0.04 10*3/uL (ref 0.00–0.07)
Basophils Absolute: 0.1 10*3/uL (ref 0.0–0.1)
Basophils Relative: 1 %
Eosinophils Absolute: 0.2 10*3/uL (ref 0.0–0.5)
Eosinophils Relative: 2 %
HCT: 33.2 % — ABNORMAL LOW (ref 36.0–46.0)
Hemoglobin: 10.8 g/dL — ABNORMAL LOW (ref 12.0–15.0)
Immature Granulocytes: 0 %
Lymphocytes Relative: 27 %
Lymphs Abs: 2.6 10*3/uL (ref 0.7–4.0)
MCH: 31.7 pg (ref 26.0–34.0)
MCHC: 32.5 g/dL (ref 30.0–36.0)
MCV: 97.4 fL (ref 80.0–100.0)
Monocytes Absolute: 1.1 10*3/uL — ABNORMAL HIGH (ref 0.1–1.0)
Monocytes Relative: 11 %
Neutro Abs: 5.7 10*3/uL (ref 1.7–7.7)
Neutrophils Relative %: 59 %
Platelets: 286 10*3/uL (ref 150–400)
RBC: 3.41 MIL/uL — ABNORMAL LOW (ref 3.87–5.11)
RDW: 13.2 % (ref 11.5–15.5)
WBC: 9.6 10*3/uL (ref 4.0–10.5)
nRBC: 0 % (ref 0.0–0.2)

## 2020-01-20 LAB — CORTISOL: Cortisol, Plasma: 11.5 ug/dL

## 2020-01-20 LAB — MAGNESIUM: Magnesium: 1.4 mg/dL — ABNORMAL LOW (ref 1.7–2.4)

## 2020-01-20 MED ORDER — PREGABALIN 25 MG PO CAPS
25.0000 mg | ORAL_CAPSULE | Freq: Every day | ORAL | Status: DC
  Administered 2020-01-20 – 2020-01-27 (×7): 25 mg via ORAL
  Filled 2020-01-20 (×7): qty 1

## 2020-01-20 MED ORDER — MAGNESIUM SULFATE 4 GM/100ML IV SOLN
4.0000 g | Freq: Once | INTRAVENOUS | Status: AC
  Administered 2020-01-20: 4 g via INTRAVENOUS
  Filled 2020-01-20: qty 100

## 2020-01-20 MED ORDER — SENNOSIDES-DOCUSATE SODIUM 8.6-50 MG PO TABS
1.0000 | ORAL_TABLET | Freq: Two times a day (BID) | ORAL | Status: DC
  Administered 2020-01-20 – 2020-01-21 (×4): 1 via ORAL
  Filled 2020-01-20 (×4): qty 1

## 2020-01-20 MED ORDER — MIRTAZAPINE 15 MG PO TBDP
7.5000 mg | ORAL_TABLET | Freq: Every day | ORAL | Status: DC
  Administered 2020-01-20 – 2020-01-26 (×7): 7.5 mg via ORAL
  Filled 2020-01-20 (×7): qty 0.5

## 2020-01-20 MED ORDER — ENOXAPARIN SODIUM 30 MG/0.3ML ~~LOC~~ SOLN
30.0000 mg | Freq: Every day | SUBCUTANEOUS | Status: DC
  Administered 2020-01-20: 30 mg via SUBCUTANEOUS
  Filled 2020-01-20: qty 0.3

## 2020-01-20 NOTE — Evaluation (Signed)
Occupational Therapy Evaluation Patient Details Name: Dana Bradford MRN: [id number] DOB: 1930-10-27 Today's Date: 01/20/2020    History of Present Illness Pt is an 86 y/o female admitted secondary to LT hip pain with cellulitis and abscess.  PMH including but not limited to Afib (on Eliquis), venous stasis dermatitis, HLD, Hypothyroidism, hypertension, RLS, s/p bilateral knee replacement.   Clinical Impression   An occupational therapy evaluation was completed on this 86 year old, right handed,  female . Patient is currently requiring assistance with ADLs including Total assist with LB dressing and toileting, moderate to maximum assist with bathing, and minimal assist with UE dressing, as well as need of Min assist for bed mobility and transfer to standing with inability to pivot to chair with assist of 1 person, all of which is below patient's typical baseline of being Modified independent at home.  During this evaluation, patient was limited by mild pain to buttock area, impaired activity tolerance, generalized weakness, and anxiety/fear of falling, which has the potential to impact patient's safety and independence during functional mobility, as well as performance for ADLs. Pt also reports poor sleep since coming to hospital and asking for a asleep aid.  [place] "6-clicks" Daily Activity Inpatient Short Form score of 14/24 indicates 59.67% ADL impairment this session. Patient lives alone, with a daughter who works during the day, and has stated that she could stay with the pt when not at work.    Patient demonstrates good rehab potential, and should benefit from continued skilled occupational therapy services while in acute care to maximize safety, independence and quality of life at home.  Continued occupational therapy services in a SNF setting prior to return home is recommended.  ?    Follow Up Recommendations  SNF;Supervision - Intermittent    Equipment Recommendations  3  in 1 bedside commode (Will also defer to post-acute recommendations)    Recommendations for Other Services       Precautions / Restrictions Precautions Precautions: Fall Restrictions Weight Bearing Restrictions: No      Mobility Bed Mobility Overal bed mobility: Needs Assistance Bed Mobility: Supine to Sit;Sit to Supine     Supine to sit: Min guard;HOB elevated Sit to supine: HOB elevated;Mod assist   General bed mobility comments: Pt required mod assist to lift LEs onto bed.  Min guard and increased time with dependence on bed rails to mobilize to sitting from supine    Transfers Overall transfer level: Needs assistance   Transfers: Sit to/from Stand;Stand Pivot Transfers Sit to Stand: Min assist;From elevated surface (Pt performed sit to/from stands at EOB x 3 reps with Min As, increased time/effort and verbal cues for hand placement and anterior weight shift.)         General transfer comment: Attempted stand pivot to recliner. Pt able to advance RT foot but not left. Pt became increasingly fatigued, and bed brough behind her to sit. Unable to safely complete pivot to chair with assist of 1 person.    Balance Overall balance assessment: Needs assistance Sitting-balance support: Single extremity supported;Feet supported Sitting balance-Leahy Scale: Fair     Standing balance support: Bilateral upper extremity supported Standing balance-Leahy Scale: Poor Standing balance comment: Required RW for safety.                           ADL either performed or assessed with clinical judgement   ADL Overall ADL's : Needs assistance/impaired Eating/Feeding: Set up;Bed level  Grooming: Bed level;Set up;Wash/dry face;Wash/dry hands;Oral care;Brushing hair   Upper Body Bathing: Min guard;Sitting;Set up   Lower Body Bathing: +2 for physical assistance;Total assistance;Sit to/from stand;Sitting/lateral leans Lower Body Bathing Details (indicate cue type and  reason): Pt required BUE support on RW in standing. Upper Body Dressing : Set up;Min guard;Sitting   Lower Body Dressing: Total assistance Lower Body Dressing Details (indicate cue type and reason): Pt attempted to don socks while sitting EOB, and unable.  Attempted backward chaining, and pt still unable to reach down sufficiently to sock. Required Total assist.   Toilet Transfer Details (indicate cue type and reason): Unable to pivot to recliner or BSC.  See mobility section. Toileting- Clothing Manipulation and Hygiene: Total assistance;Bed level Toileting - Clothing Manipulation Details (indicate cue type and reason): Currently on pure wick.     Functional mobility during ADLs: Minimal assistance;Rolling walker;Cueing for sequencing       Vision Baseline Vision/History: Wears glasses Wears Glasses: Reading only Vision Assessment?: No apparent visual deficits     Perception     Praxis      Pertinent Vitals/Pain Pain Assessment: 0-10 Pain Score: 2  Pain Location: Bottom Pain Intervention(s): Limited activity within patient's tolerance;Premedicated before session;Repositioned     Hand Dominance Right   Extremity/Trunk Assessment Upper Extremity Assessment Upper Extremity Assessment: Generalized weakness   Lower Extremity Assessment Lower Extremity Assessment: Defer to PT evaluation   Cervical / Trunk Assessment Cervical / Trunk Assessment: Kyphotic   Communication Communication Communication: HOH   Cognition Arousal/Alertness: Awake/alert Behavior During Therapy: WFL for tasks assessed/performed Overall Cognitive Status: Within Functional Limits for tasks assessed                                     General Comments       Exercises     Shoulder Instructions      Home Living Family/patient expects to be discharged to:: Skilled nursing facility Living Arrangements: Alone Available Help at Discharge: Family;Available PRN/intermittently  (Daughter can stay with pt but does work days.) Type of Home: House Home Access: Stairs to enter Technical brewer of Steps: 3 Entrance Stairs-Rails: Right Home Layout: One level     Bathroom Shower/Tub: Occupational psychologist: [place]: Environmental consultant - 2 wheels;Cane - single point;Shower seat;Cane - quad;Hand held shower head   Additional Comments: Adjustable bed without rails, several canes including hurry-cane,      Prior Functioning/Environment Level of Independence: Independent with assistive device(s)        Comments: ambulates with a RW or cane; daughter drives; pt performs all her own BADLs and IADLs without assistance.  Pt denies falls but endorses feeling unsteady at baseline.        OT Problem List: Decreased strength;Pain;Decreased activity tolerance;Decreased knowledge of use of DME or AE;Impaired balance (sitting and/or standing)      OT Treatment/Interventions: Self-care/ADL training;Therapeutic exercise;Therapeutic activities;Energy conservation;DME and/or AE instruction;Patient/family education;Balance training    OT Goals(Current goals can be found in the care plan section) Acute Rehab OT Goals Patient Stated Goal: To increase strength and endurance. OT Goal Formulation: With patient Time For Goal Achievement: 02/03/20 Potential to Achieve Goals: Good ADL Goals Pt Will Perform Lower Body Dressing: with adaptive equipment;with set-up;sitting/lateral leans;sit to/from stand;with supervision Pt Will Transfer to Toilet: with supervision;stand pivot transfer;ambulating;bedside commode;regular height toilet Pt Will Perform Toileting - Clothing Manipulation and hygiene:  with adaptive equipment;with supervision;sitting/lateral leans;sit to/from stand Additional ADL Goal #1: Pt will engage in 10 min EOB activity with good sitting balance and functional use of UEs to demonstrate improved endurance and sitting balance needed for home  ADLs. Additional ADL Goal #2: Patient will tolerate BUE home exercise program 10-15 reps within pain-free ranges, in an unsupported seated position, in order to improve upper body strength, endurance and core stability needed to complete home ADLs.  OT Frequency: Min 2X/week   Barriers to D/C: Decreased caregiver support (Lives alone)          Co-evaluation              AM-PAC OT "6 Clicks" Daily Activity     Outcome Measure Help from another person eating meals?: None Help from another person taking care of personal grooming?: A Little Help from another person toileting, which includes using toliet, bedpan, or urinal?: Total Help from another person bathing (including washing, rinsing, drying)?: A Lot Help from another person to put on and taking off regular upper body clothing?: A Little Help from another person to put on and taking off regular lower body clothing?: Total 6 Click Score: 14   End of Session Equipment Utilized During Treatment: Gait belt;Rolling walker Nurse Communication: Mobility status (Collaborated time for pain meds)  Activity Tolerance: Patient tolerated treatment well;Patient limited by fatigue Patient left: in bed;with call bell/phone within reach;with bed alarm set  OT Visit Diagnosis: Unsteadiness on feet (R26.81);Pain;Muscle weakness (generalized) (M62.81) Pain - part of body:  (bottom)                Time: 0174-9449 OT Time Calculation (min): 41 min Charges:  OT General Charges $OT Visit: 1 Visit OT Evaluation $OT Eval Low Complexity: 1 Low $OT Eval Moderate Complexity: 1 Mod OT Treatments $Self Care/Home Management : 8-22 mins $Therapeutic Activity: 8-22 mins  Anderson Malta, OT Acute Rehab Services Office: [phone] 01/20/2020   Julien Girt 01/20/2020, 11:28 AM

## 2020-01-20 NOTE — Progress Notes (Signed)
Subjective:   [name] is a 86 y/o female with history of end-stage left hip osteoarthritis, who was scheduled for a total hip arthroplasty (anterior approach) on 01/22/2020 with Dione Plover. Aluisio, MD. She presented to the emergency room at [facility] on 01/17/2020 and was diagnosed with left lower extremity cellulitis. Upon discharge, she had an increase in left hip pain when getting into her car. She then was brought to [facility] for pain control, was unable to ambulate at home due to left hip pain. Today she is resting comfortably in bed, reports her pain is managed except with attempted movement. Cellulitis is improving per hospitalist service.   Objective: Vital signs in last 24 hours: Temp:  [98.5 F (36.9 C)-98.7 F (37.1 C)] 98.5 F (36.9 C) (11/29 1353) Pulse Rate:  [71-93] 83 (11/29 1353) Resp:  [16-18] 17 (11/29 1353) BP: (92-120)/(62-95) 120/82 (11/29 1353) SpO2:  [90 %-93 %] 93 % (11/29 1353)  Intake/Output from previous day:  Intake/Output Summary (Last 24 hours) at 01/20/2020 1524 Last data filed at 01/20/2020 1001 Gross per 24 hour  Intake 1732.6 ml  Output 1375 ml  Net 357.6 ml    Intake/Output this shift: Total I/O In: -  Out: 925 [Urine:925]  Labs: Recent Labs    01/18/20 0215 01/18/20 1742 01/19/20 0621 01/20/20 0413  HGB 13.1 12.5 10.6* 10.8*   Recent Labs    01/19/20 0621 01/20/20 0413  WBC 9.0 9.6  RBC 3.34* 3.41*  HCT 32.5* 33.2*  PLT 282 286   Recent Labs    01/19/20 0621 01/20/20 0413  NA 139 139  K 3.5 3.8  CL 105 106  CO2 26 24  BUN 30* 19  CREATININE 1.05* 0.76  GLUCOSE 141* 98  CALCIUM 8.5* 8.3*   No results for input(s): LABPT, INR in the last 72 hours.  Exam: General - Patient is Alert and Oriented Extremity - Neurologically intact Neurovascular intact Sensation intact distally Dorsiflexion/Plantar flexion intact Pain with any attempted movement of the left hip. No ecchymosis or open  lesions. Motor Function - intact, moving foot and toes well on exam.   Past Medical History:  Diagnosis Date  . Acute kidney failure ([place]) 2016 to 2017 due to vacomycin   no kidney issues now  . Anemia   . Anxiety   . Arthritis   . Atrial fibrillation ([place])   . Depression   . Diabetes mellitus without complication ([place])   . Dyspnea   . Food allergy    Fish ([place])   . Glaucoma   . Hypertension   . Impaired fasting glucose   . Other specified acquired hypothyroidism   . Pneumonia   . Restless leg   . Restless leg   . Skin cancer    Forehead  . Stroke ([place]) 05/2017   ocular stoke on plavix , right eye some vision loss  . Venous stasis ulcer ([facility])     Assessment/Plan:    Principal Problem:   Left leg cellulitis Active Problems:   Essential hypertension, benign   GERD (gastroesophageal reflux disease)   OA (osteoarthritis) of hip   Dehydration   Hypotension   Hypothyroidism   Hyperlipidemia   AF (paroxysmal atrial fibrillation) ([facility])  Estimated body mass index is 31.94 kg/m as calculated from the following:   Height as of this encounter: 4' 9.99" (1.473 m).   Weight as of this encounter: 69.3 kg.  Dr. Wynelle Link spoke with Dr. Grandville Silos regarding this case. Due  to her advanced OA and difficulty ambulating, we keep her in the hospital until Wednesday for pain control. Will keep surgery scheduled for 4:20 pm on Wednesday, 12/1.   Continue lovenox, will hold on Wednesday AM.  NPO Tuesday night at 2359. Orthopedics will take over care following surgery. Anticipate that she will be able to discharge home afterwards, will likely need a few days to work with physical therapy.  Theresa Duty, PA-C Orthopedic Surgery [phone] 01/20/2020, 3:24 PM

## 2020-01-20 NOTE — Progress Notes (Addendum)
PROGRESS NOTE    Dana Bradford  IPJ:[id number] DOB: August 14, 1930 DOA: 01/18/2020 PCP: Jonathon Resides, MD    Chief Complaint  Patient presents with  . Back Pain    Brief Narrative:  HPI per Dr. Tobie Poet H&P obtained from daughter because patient is sleeping status post pain medication  HPI: Dana Bradford is a 86 y.o. female with medical history significant for afib on eliquis 5 mg BID 75 mg BID, hypertension, hypothyroid, presented to the emergency department for chief concerns of left hip pain.  Patient has chronic back pain and it worsened after presentation to ED at [facility]. She was discharged home with treatments for cellulitis.  While being carried from her wheelchair to the car, per daughter states that she was deposited heavily and it resulted in worsening of her left hip.  Patient denies fever, chest pain, shortness of breath, abdominal pain.  Allergies: amlodipine causes angioedema   ED Course: Gust with ED provider, requesting admission for pain control of left hip   Assessment & Plan:   Principal Problem:   Left leg cellulitis Active Problems:   Essential hypertension, benign   GERD (gastroesophageal reflux disease)   OA (osteoarthritis) of hip   Dehydration   Hypotension   Hypothyroidism   Hyperlipidemia   AF (paroxysmal atrial fibrillation) ([place])  1 left lower extremity cellulitis Patient with a left lower extremity cellulitis with swelling and erythema.  Lower extremity Dopplers done negative for DVT.  Plain films of left hip and pelvis negative for any acute fracture.  Plain films of the L-spine with severe end-stage osteoarthritis of the left hip.  CT left hip with no acute osseous abnormality, severe degenerative changes of the left hip.  Continue IV Rocephin and transition to oral antibiotics tomorrow.  Supportive care.  Follow.  2.  Hypotension Likely secondary to hypovolemia.  Patient afebrile.  Patient with a leukocytosis on admission  that has improved and normalized.  Patient with no overt bleeding.  Patient with no chest pain or no concern for ischemia.  Patient with elevated BUN on admission which is trending down pointing towards dehydration.  Improved with hydration.  Saline lock IV fluids.  Follow.   3.  Hypothyroidism Synthroid.    4.  Advanced end-stage osteoarthritic changes of the left hip Patient being followed by orthopedic surgery in the outpatient setting and scheduled for outpatient surgery on 01/22/2019. Patient with ongoing pain with movement.  Currently two-person per PT/OT.  Patient's end-stage advanced OA leading to pain and difficulty ambulating.  Case discussed with orthopedics, Dr.Aluisio will be kept in the hospital until Wednesday for pain control and also in anticipation of surgery.  Follow.   5.  Hypertension Continue to hold oral antihypertensive medications secondary to problem #2.  Follow.   6.  Hyperlipidemia Continue statin.  7.  Paroxysmal atrial fibrillation Currently rate controlled.  Discontinue Eliquis in anticipation of surgery 01/22/2020.   8.  Dehydration Improved with hydration.  Saline lock IV fluids.  9.  GERD Continue PPI.  10.  Hypomagnesemia Magnesium sulfate 4 g IV x1.  Repeat labs in the morning.   DVT prophylaxis: Lovenox Code Status: DNR Family Communication: Updated patient.  No family at bedside. Disposition:   Status is: Observation    Dispo: The patient is from: Home              Anticipated d/c is to: Home with home health versus SNF  Anticipated d/c date is: To be determined              Patient currently with advanced end-stage osteoarthritis with significant pain on ambulation, requiring 2 person assist per OT and PT.  Not stable for discharge.       Consultants:   Orthopedics: Dr. Wynelle Link 01/20/2020  Procedures:  Lower extremity Dopplers 01/19/2020  CT left hip 01/18/2020  Plain films of the left hip and pelvis  01/18/2020  Plain films of the L-spine 01/18/2020  Antimicrobials:   IV Rocephin 01/19/2020>>>>   Subjective: Patient lying in bed.  States no significant pain when laying still has not ambulated yet.  Denies any chest pain.  No shortness of breath.  Complaining of burning in the left foot.   Objective: Vitals:   01/19/20 1436 01/19/20 1538 01/19/20 2119 01/20/20 0426  BP: (!) 98/54 92/62 104/79 (!) 115/95  Pulse: 80 81 93 71  Resp:  16 16 18   Temp:  98.7 F (37.1 C) 98.5 F (36.9 C) 98.7 F (37.1 C)  TempSrc:  Oral Oral Oral  SpO2:  93% 90% 92%  Weight:      Height:        Intake/Output Summary (Last 24 hours) at 01/20/2020 1036 Last data filed at 01/20/2020 1001 Gross per 24 hour  Intake 1732.6 ml  Output 1375 ml  Net 357.6 ml   Filed Weights   01/19/20 0315  Weight: 69.3 kg    Examination:  General exam: NAD Respiratory system: CTAB.  No wheezes, no crackles, no rhonchi.  Normal respiratory effort.   Cardiovascular system: Regular rate rhythm no murmurs rubs or gallops.  No JVD.  No lower extremity edema.  Gastrointestinal system: Abdomen is soft, nontender, nondistended, positive bowel sounds.  No rebound.  No guarding. Central nervous system: Alert and oriented. No focal neurological deficits. Extremities: Left lower extremity with some slight erythema, symmetric 5 x 5 power. Skin: No rashes, lesions or ulcers Psychiatry: Judgement and insight appear normal. Mood & affect appropriate.     Data Reviewed: I have personally reviewed following labs and imaging studies  CBC: Recent Labs  Lab 01/18/20 0215 01/18/20 1742 01/19/20 0621 01/20/20 0413  WBC 10.6* 11.0* 9.0 9.6  NEUTROABS 6.3 8.1*  --  5.7  HGB 13.1 12.5 10.6* 10.8*  HCT 38.0 37.3 32.5* 33.2*  MCV 94.5 96.1 97.3 97.4  PLT 298 297 282 790    Basic Metabolic Panel: Recent Labs  Lab 01/18/20 0215 01/18/20 1742 01/19/20 0621 01/20/20 0413  NA 136 136 139 139  K 3.0* 3.5 3.5 3.8  CL  98 101 105 106  CO2 26 25 26 24   GLUCOSE 107* 114* 141* 98  BUN 40* 44* 30* 19  CREATININE 1.36* 1.37* 1.05* 0.76  CALCIUM 9.1 9.1 8.5* 8.3*  MG  --   --   --  1.4*    GFR: Estimated Creatinine Clearance: 40.9 mL/min (by C-G formula based on SCr of 0.76 mg/dL).  Liver Function Tests: No results for input(s): AST, ALT, ALKPHOS, BILITOT, PROT, ALBUMIN in the last 168 hours.  CBG: Recent Labs  Lab 01/15/20 1503  GLUCAP 101*     Recent Results (from the past 240 hour(s))  Surgical pcr screen     Status: None   Collection Time: 01/15/20  3:20 PM   Specimen: Nasal Mucosa; Nasal Swab  Result Value Ref Range Status   MRSA, PCR NEGATIVE NEGATIVE Final   Staphylococcus aureus NEGATIVE NEGATIVE Final  Comment: (NOTE) The Xpert SA Assay (FDA approved for NASAL specimens in patients 40 years of age and older), is one component of a comprehensive surveillance program. It is not intended to diagnose infection nor to guide or monitor treatment. Performed at [facility], [place] [address], [place], [place] [address]   Resp Panel by RT-PCR (Flu A&B, Covid) Nasopharyngeal Swab     Status: None   Collection Time: 01/19/20  1:11 AM   Specimen: Nasopharyngeal Swab; Nasopharyngeal(NP) swabs in vial transport medium  Result Value Ref Range Status   SARS Coronavirus 2 by RT PCR NEGATIVE NEGATIVE Final    Comment: (NOTE) SARS-CoV-2 target nucleic acids are NOT DETECTED.  The SARS-CoV-2 RNA is generally detectable in upper respiratory specimens during the acute phase of infection. The lowest concentration of SARS-CoV-2 viral copies this assay can detect is 138 copies/mL. A negative result does not preclude SARS-Cov-2 infection and should not be used as the sole basis for treatment or other patient management decisions. A negative result may occur with  improper specimen collection/handling, submission of specimen other than nasopharyngeal swab, presence of viral  mutation(s) within the areas targeted by this assay, and inadequate number of viral copies(<138 copies/mL). A negative result must be combined with clinical observations, patient history, and epidemiological information. The expected result is Negative.  Fact Sheet for Patients:  EntrepreneurPulse.com.au  Fact Sheet for Healthcare Providers:  IncredibleEmployment.be  This test is no t yet approved or cleared by the [place] FDA and  has been authorized for detection and/or diagnosis of SARS-CoV-2 by FDA under an Emergency Use Authorization (EUA). This EUA will remain  in effect (meaning this test can be used) for the duration of the COVID-19 declaration under Section 564(b)(1) of the Act, 21 U.S.C.section 360bbb-3(b)(1), unless the authorization is terminated  or revoked sooner.       Influenza A by PCR NEGATIVE NEGATIVE Final   Influenza B by PCR NEGATIVE NEGATIVE Final    Comment: (NOTE) The Xpert Xpress SARS-CoV-2/FLU/RSV plus assay is intended as an aid in the diagnosis of influenza from Nasopharyngeal swab specimens and should not be used as a sole basis for treatment. Nasal washings and aspirates are unacceptable for Xpert Xpress SARS-CoV-2/FLU/RSV testing.  Fact Sheet for Patients: EntrepreneurPulse.com.au  Fact Sheet for Healthcare Providers: IncredibleEmployment.be  This test is not yet approved or cleared by the [place] FDA and has been authorized for detection and/or diagnosis of SARS-CoV-2 by FDA under an Emergency Use Authorization (EUA). This EUA will remain in effect (meaning this test can be used) for the duration of the COVID-19 declaration under Section 564(b)(1) of the Act, 21 U.S.C. section 360bbb-3(b)(1), unless the authorization is terminated or revoked.  Performed at [facility], [place] [address] [place], [place] [address]          Radiology  Studies: DG Lumbar Spine Complete  Result Date: 01/18/2020 CLINICAL DATA:  Acute low back pain EXAM: LUMBAR SPINE - COMPLETE 4+ VIEW COMPARISON:  08/24/2019 FINDINGS: There is no evidence of lumbar spine fracture. Vertebral body heights appear maintained. Unchanged alignment including grade 1 anterolisthesis of L4 on L5 and trace retrolisthesis of L1 on L2. Similar facet predominant degenerative changes of the lower lumbar spine partially visualized right hip arthroplasty. Severe, end-stage osteoarthritis of the left hip. IMPRESSION: 1. No evidence of acute fracture or traumatic listhesis of the lumbar spine. 2. Severe, end-stage osteoarthritis of the left hip. Electronically Signed   By: Davina Poke D.O.  On: 01/18/2020 18:37   CT Hip Left Wo Contrast  Result Date: 01/18/2020 CLINICAL DATA:  Acute on chronic left hip pain, concern for occult fracture. EXAM: CT OF THE LEFT HIP WITHOUT CONTRAST TECHNIQUE: Multidetector CT imaging of the left hip was performed according to the standard protocol. Multiplanar CT image reconstructions were also generated. COMPARISON:  Same day hip radiograph. FINDINGS: Bones/Joint/Cartilage No acute osseous injury is identified. There is no left hip joint dislocation or joint effusion. There are severe degenerative changes of the left hip with cyst formation. Ligaments Suboptimally assessed by CT. Muscles and Tendons No abnormality noted. Soft tissues Visible portions of the pelvis and abdomen appear normal. Multiple left inguinal lymph nodes are noted, measuring up to 8 mm in short axis. Vascular calcifications are seen in the left common femoral artery. IMPRESSION: 1. No acute osseous abnormality. 2. Severe degenerative changes of the left hip. Electronically Signed   By: Zerita Boers M.D.   On: 01/18/2020 20:12   DG Hip Unilat W or Wo Pelvis 2-3 Views Left  Result Date: 01/18/2020 CLINICAL DATA:  Worsening left hip pain. EXAM: DG HIP (WITH OR WITHOUT PELVIS) 2-3V  LEFT COMPARISON:  None. FINDINGS: There is no evidence of fracture or dislocation. Advanced osteoarthritic changes of the left hip with bone-on-bone contact, subchondral sclerosis, subchondral cysts and osteophyte formation. Post right hip arthroplasty. Intact hardware. IMPRESSION: Advanced osteoarthritic changes of the left hip. Electronically Signed   By: Fidela Salisbury M.D.   On: 01/18/2020 18:36   VAS [place] LOWER EXTREMITY VENOUS (DVT)  Result Date: 01/19/2020  Lower Venous DVT Study Indications: LT hip pain, swelling, erythema, cellulitis.  Anticoagulation: Eliquis. Comparison Study: No prior studies. Performing Technologist: Darlin Coco, RDMS  Examination Guidelines: A complete evaluation includes B-mode imaging, spectral Doppler, color Doppler, and power Doppler as needed of all accessible portions of each vessel. Bilateral testing is considered an integral part of a complete examination. Limited examinations for reoccurring indications may be performed as noted. The reflux portion of the exam is performed with the patient in reverse Trendelenburg.  +---------+---------------+---------+-----------+----------+--------------+ RIGHT    CompressibilityPhasicitySpontaneityPropertiesThrombus Aging +---------+---------------+---------+-----------+----------+--------------+ CFV      Full           Yes      Yes                                 +---------+---------------+---------+-----------+----------+--------------+ SFJ      Full                                                        +---------+---------------+---------+-----------+----------+--------------+ FV Prox  Full                                                        +---------+---------------+---------+-----------+----------+--------------+ FV Mid   Full                                                        +---------+---------------+---------+-----------+----------+--------------+  FV DistalFull                                                         +---------+---------------+---------+-----------+----------+--------------+ PFV      Full                                                        +---------+---------------+---------+-----------+----------+--------------+ POP      Full           Yes      Yes                                 +---------+---------------+---------+-----------+----------+--------------+ PTV      Full                                                        +---------+---------------+---------+-----------+----------+--------------+ PERO     Full                                                        +---------+---------------+---------+-----------+----------+--------------+   +---------+---------------+---------+-----------+----------+--------------+ LEFT     CompressibilityPhasicitySpontaneityPropertiesThrombus Aging +---------+---------------+---------+-----------+----------+--------------+ CFV      Full           Yes      Yes                                 +---------+---------------+---------+-----------+----------+--------------+ SFJ      Full                                                        +---------+---------------+---------+-----------+----------+--------------+ FV Prox  Full                                                        +---------+---------------+---------+-----------+----------+--------------+ FV Mid   Full                                                        +---------+---------------+---------+-----------+----------+--------------+ FV DistalFull                                                        +---------+---------------+---------+-----------+----------+--------------+  PFV      Full                                                        +---------+---------------+---------+-----------+----------+--------------+ POP      Full           Yes      Yes                                  +---------+---------------+---------+-----------+----------+--------------+ PTV      Full                                                        +---------+---------------+---------+-----------+----------+--------------+ PERO     Full                                                        +---------+---------------+---------+-----------+----------+--------------+     Summary: RIGHT: - There is no evidence of deep vein thrombosis in the lower extremity.  - No cystic structure found in the popliteal fossa.  LEFT: - There is no evidence of deep vein thrombosis in the lower extremity.  - No cystic structure found in the popliteal fossa.  *See table(s) above for measurements and observations.    Preliminary         Scheduled Meds: . acetaminophen  500 mg Oral TID  . apixaban  5 mg Oral BID  . vitamin C  500 mg Oral BID  . brimonidine  1 drop Both Eyes BID  . cholecalciferol  2,000 Units Oral Daily  . famotidine  40 mg Oral QHS  . ferrous sulfate  325 mg Oral BID  . latanoprost  1 drop Both Eyes QHS  . levothyroxine  88 mcg Oral QAC breakfast  . pantoprazole  80 mg Oral Daily  . potassium chloride  10 mEq Oral Daily  . pramipexole  1 mg Oral QHS  . senna-docusate  1 tablet Oral BID  . simvastatin  20 mg Oral QPM  . timolol  1 drop Both Eyes BID   Continuous Infusions: . cefTRIAXone (ROCEPHIN)  IV Stopped (01/20/20 0156)  . magnesium sulfate bolus IVPB 4 g (01/20/20 1019)     LOS: 1 day    Time spent: 35 minutes    Irine Seal, MD [facility]   To contact the attending provider between 7A-7P or the covering provider during after hours 7P-7A, please log into the web site [email or web address] and access using universal  password for that web site. If you do not have the password, please call the hospital operator.  01/20/2020, 10:36 AM

## 2020-01-20 NOTE — Evaluation (Signed)
Physical Therapy Evaluation Patient Details Name: Dana Bradford MRN: [id number] DOB: 11-29-1930 Today's Date: 01/20/2020   History of Present Illness  Pt is an 86 y/o female admitted secondary to L hip pain with cellulitis.  PMH including but not limited to Afib (on Eliquis), venous stasis dermatitis, HLD, Hypothyroidism, hypertension, RLS, s/p bilateral knee replacement.  Clinical Impression  On eval, pt required Mod assist for mobility. She walked ~15 feet x 2 using a RW. Pt presents with general weakness, decreased activity tolerance, and impaired gait and balance. Per chart and pt, she is scheduled to have a L THA this week. At this time recommendation is for ST SNF for rehab. Will re-evaluate and update recommendations as needed after THA.     Follow Up Recommendations SNF    Equipment Recommendations  Rolling walker with 5" wheels (youth height)    Recommendations for Other Services OT consult     Precautions / Restrictions Precautions Precautions: Fall Restrictions Weight Bearing Restrictions: No      Mobility  Bed Mobility Overal bed mobility: Needs Assistance Bed Mobility: Supine to Sit;Sit to Supine     Supine to sit: Min assist Sit to supine: Mod assist   General bed mobility comments: Min-Mod Assist for LEs. Increased time.    Transfers Overall transfer level: Needs assistance Equipment used: Rolling walker (2 wheeled) Transfers: Sit to/from [facility] Sit to Stand: Min assist Stand pivot transfers: Min assist       General transfer comment: x3. VCs safety, techique, hand placement. Assist to power up, stabilize, control descent. Increased time. Stand pivot, bed to recliner, using RW.  Ambulation/Gait Ambulation/Gait assistance: Min assist Gait Distance (Feet): 15 Feet (x2) Assistive device: Rolling walker (2 wheeled) Gait Pattern/deviations: Step-to pattern;Decreased step length - left;Decreased stride length     General Gait  Details: Assist to stabilize pt throughout distance. Very slow gait speed. Improved ability to advance R LE as distance increased. Pt was fatigued after short walk. HR up to 140s.  Stairs            Wheelchair Mobility    Modified Rankin (Stroke Patients Only)       Balance Overall balance assessment: Needs assistance         Standing balance support: Bilateral upper extremity supported Standing balance-Leahy Scale: Poor                               Pertinent Vitals/Pain Pain Assessment: Faces Faces Pain Scale: Hurts even more Pain Location: LEs (L worse than R) Pain Descriptors / Indicators: Discomfort;Sore Pain Intervention(s): Monitored during session;Limited activity within patient's tolerance;Repositioned    Home Living Family/patient expects to be discharged to:: Unsure Living Arrangements: Alone Available Help at Discharge: Family;Available PRN/intermittently Type of Home: House Home Access: Stairs to enter Entrance Stairs-Rails: Right Entrance Stairs-Number of Steps: 3 Home Layout: One level Home Equipment: Walker - 2 wheels;Cane - single point;Shower seat;Cane - quad;Hand held shower head      Prior Function Level of Independence: Independent with assistive device(s)         Comments: ambulates with a RW or cane; daughter drives; pt performs all her own BADLs and IADLs without assistance.  Pt denies falls but endorses feeling unsteady at baseline.     Hand Dominance        Extremity/Trunk Assessment   Upper Extremity Assessment Upper Extremity Assessment: Defer to OT evaluation    Lower Extremity Assessment  Lower Extremity Assessment: Generalized weakness    Cervical / Trunk Assessment Cervical / Trunk Assessment: Kyphotic  Communication   Communication: HOH  Cognition Arousal/Alertness: Awake/alert Behavior During Therapy: WFL for tasks assessed/performed Overall Cognitive Status: Within Functional Limits for tasks  assessed                                        General Comments      Exercises     Assessment/Plan    PT Assessment Patient needs continued PT services  PT Problem List Decreased strength;Decreased range of motion;Decreased activity tolerance;Decreased balance;Decreased knowledge of use of DME;Decreased mobility       PT Treatment Interventions DME instruction;Gait training;Therapeutic activities;Therapeutic exercise;Patient/family education;Functional mobility training;Balance training    PT Goals (Current goals can be found in the Care Plan section)  Acute Rehab PT Goals Patient Stated Goal: To be able to walk. To have hip replacement this week PT Goal Formulation: With patient Time For Goal Achievement: 02/03/20 Potential to Achieve Goals: Good    Frequency Min 3X/week   Barriers to discharge Decreased caregiver support      Co-evaluation               AM-PAC PT "6 Clicks" Mobility  Outcome Measure Help needed turning from your back to your side while in a flat bed without using bedrails?: A Lot Help needed moving from lying on your back to sitting on the side of a flat bed without using bedrails?: A Lot Help needed moving to and from a bed to a chair (including a wheelchair)?: A Little Help needed standing up from a chair using your arms (e.g., wheelchair or bedside chair)?: A Little Help needed to walk in hospital room?: A Little Help needed climbing 3-5 steps with a railing? : Total 6 Click Score: 14    End of Session Equipment Utilized During Treatment: Gait belt Activity Tolerance: Patient limited by fatigue Patient left: in bed;with call bell/phone within reach;with bed alarm set   PT Visit Diagnosis: Muscle weakness (generalized) (M62.81);Other abnormalities of gait and mobility (R26.89)    Time: 6861-6837 PT Time Calculation (min) (ACUTE ONLY): 37 min   Charges:   PT Evaluation $PT Eval Moderate Complexity: 1 Mod PT  Treatments $Gait Training: 8-22 mins           Doreatha Massed, PT Acute Rehabilitation  Office: [phone] Pager: [phone]

## 2020-01-21 DIAGNOSIS — L03116 Cellulitis of left lower limb: Secondary | ICD-10-CM | POA: Diagnosis not present

## 2020-01-21 DIAGNOSIS — I480 Paroxysmal atrial fibrillation: Secondary | ICD-10-CM | POA: Diagnosis not present

## 2020-01-21 DIAGNOSIS — E860 Dehydration: Secondary | ICD-10-CM | POA: Diagnosis not present

## 2020-01-21 DIAGNOSIS — R2681 Unsteadiness on feet: Secondary | ICD-10-CM

## 2020-01-21 DIAGNOSIS — N179 Acute kidney failure, unspecified: Secondary | ICD-10-CM | POA: Diagnosis not present

## 2020-01-21 LAB — BASIC METABOLIC PANEL
Anion gap: 10 (ref 5–15)
BUN: 14 mg/dL (ref 8–23)
CO2: 26 mmol/L (ref 22–32)
Calcium: 8.5 mg/dL — ABNORMAL LOW (ref 8.9–10.3)
Chloride: 100 mmol/L (ref 98–111)
Creatinine, Ser: 0.85 mg/dL (ref 0.44–1.00)
GFR, Estimated: >60 mL/min (ref >60)
Glucose, Bld: 101 mg/dL — ABNORMAL HIGH (ref 70–99)
Potassium: 3.7 mmol/L (ref 3.5–5.1)
Sodium: 136 mmol/L (ref 135–145)

## 2020-01-21 LAB — MAGNESIUM: Magnesium: 2.1 mg/dL (ref 1.7–2.4)

## 2020-01-21 MED ORDER — DILTIAZEM HCL ER COATED BEADS 180 MG PO CP24
180.0000 mg | ORAL_CAPSULE | Freq: Every day | ORAL | Status: DC
  Administered 2020-01-21 – 2020-01-23 (×3): 180 mg via ORAL
  Filled 2020-01-21 (×3): qty 1

## 2020-01-21 MED ORDER — ENOXAPARIN SODIUM 40 MG/0.4ML ~~LOC~~ SOLN
40.0000 mg | Freq: Every day | SUBCUTANEOUS | Status: DC
  Administered 2020-01-21: 40 mg via SUBCUTANEOUS
  Filled 2020-01-21: qty 0.4

## 2020-01-21 MED ORDER — PNEUMOCOCCAL VAC POLYVALENT 25 MCG/0.5ML IJ INJ
0.5000 mL | INJECTION | INTRAMUSCULAR | Status: DC
  Filled 2020-01-21 (×2): qty 0.5

## 2020-01-21 NOTE — NC FL2 (Signed)
[place] LEVEL OF CARE SCREENING TOOL     IDENTIFICATION  Patient Name: Dana Bradford Birthdate: 1930-09-09 Sex: female Admission Date (Current Location): 01/18/2020  [facility] and [place] Number:  Herbalist and Address:  [facility],  [place], [place]      Provider Number: [id number]  Attending Physician Name and Address:  Eugenie Filler, MD  Relative Name and Phone Number:  Barbaraann Share daughter [phone]    Current Level of Care: Hospital Recommended Level of Care: [place] Prior Approval Number:    Date Approved/Denied:   PASRR Number: [id number]  Discharge Plan: SNF    Current Diagnoses: Patient Active Problem List   Diagnosis Date Noted  . Dehydration 01/19/2020  . Hypotension 01/19/2020  . Hypothyroidism 01/19/2020  . Hyperlipidemia 01/19/2020  . AF (paroxysmal atrial fibrillation) ([place]) 01/19/2020  . Left leg cellulitis 01/18/2020  . Back pain 08/24/2019  . Generalized weakness 08/24/2019  . OA (osteoarthritis) of hip 02/28/2018  . GERD (gastroesophageal reflux disease) 11/13/2012  . Essential hypertension, benign 06/17/2012  . Impaired fasting glucose 06/17/2012  . Other specified acquired hypothyroidism 06/05/2012  . Restless leg syndrome 06/05/2012    Orientation RESPIRATION BLADDER Height & Weight     Self, Time, Situation, Place  Normal External catheter, Incontinent Weight: 69.3 kg Height:  4' 9.99" (147.3 cm)  BEHAVIORAL SYMPTOMS/MOOD NEUROLOGICAL BOWEL NUTRITION STATUS      Continent Diet (Heart Carb Modified)  AMBULATORY STATUS COMMUNICATION OF NEEDS Skin   Extensive Assist Verbally Other (Comment), Surgical wounds (Lower Left Extremity Cellulitis, Left Hip Replacement)                       Personal Care Assistance Level of Assistance  Bathing, Feeding, Dressing Bathing Assistance: Maximum assistance Feeding assistance: Independent Dressing Assistance:  Limited assistance     Functional Limitations Info  Sight, Hearing, Speech Sight Info: Impaired Hearing Info: Impaired Speech Info: Adequate    SPECIAL CARE FACTORS FREQUENCY  PT (By licensed PT), OT (By licensed OT)     PT Frequency: PT 5x week OT Frequency: OT 5x week            Contractures Contractures Info: Not present    Additional Factors Info  Code Status, Allergies Code Status Info: DNR Allergies Info: Arthrotec Diclofenac-misoprostol, Doxycycline, Latex, Amlodipine, Celebrex Celecoxib, Vancomycin, Diclofenac, Shellfish Allergy           Current Medications (01/21/2020):  This is the current hospital active medication list Current Facility-Administered Medications  Medication Dose Route Frequency Provider Last Rate Last Admin  . acetaminophen (TYLENOL) tablet 325 mg  325 mg Oral Q6H PRN Cox, Amy N, DO   325 mg at 01/19/20 0534   Or  . acetaminophen (TYLENOL) suppository 325 mg  325 mg Rectal Q6H PRN Cox, Amy N, DO      . acetaminophen (TYLENOL) tablet 500 mg  500 mg Oral TID Eugenie Filler, MD   500 mg at 01/21/20 1020  . ascorbic acid (VITAMIN C) tablet 500 mg  500 mg Oral BID Eugenie Filler, MD   500 mg at 01/21/20 1020  . brimonidine (ALPHAGAN) 0.2 % ophthalmic solution 1 drop  1 drop Both Eyes BID Eugenie Filler, MD   1 drop at 01/21/20 1021  . cefTRIAXone (ROCEPHIN) 1 g in sodium chloride 0.9 % 100 mL IVPB  1 g Intravenous Q24H Cox, Amy N, DO 200 mL/hr at 01/21/20 0119  1 g at 01/21/20 0119  . cholecalciferol (VITAMIN D) tablet 2,000 Units  2,000 Units Oral Daily Eugenie Filler, MD   2,000 Units at 01/21/20 1020  . diltiazem (CARDIZEM CD) 24 hr capsule 180 mg  180 mg Oral Daily Eugenie Filler, MD   180 mg at 01/21/20 1018  . enoxaparin (LOVENOX) injection 40 mg  40 mg Subcutaneous Daily Leodis Sias T, RPH   40 mg at 01/21/20 1021  . famotidine (PEPCID) tablet 40 mg  40 mg Oral QHS Eugenie Filler, MD   40 mg at 01/20/20 2122  .  latanoprost (XALATAN) 0.005 % ophthalmic solution 1 drop  1 drop Both Eyes QHS Eugenie Filler, MD   1 drop at 01/20/20 2133  . levothyroxine (SYNTHROID) tablet 88 mcg  88 mcg Oral QAC breakfast Cox, Amy N, DO   88 mcg at 01/21/20 0555  . mirtazapine (REMERON SOL-TAB) disintegrating tablet 7.5 mg  7.5 mg Oral QHS Eugenie Filler, MD   7.5 mg at 01/20/20 2125  . ondansetron (ZOFRAN) tablet 4 mg  4 mg Oral Q6H PRN Cox, Amy N, DO       Or  . ondansetron (ZOFRAN) injection 4 mg  4 mg Intravenous Q6H PRN Cox, Amy N, DO      . oxyCODONE (Oxy IR/ROXICODONE) immediate release tablet 5 mg  5 mg Oral Q6H PRN Eugenie Filler, MD   5 mg at 01/21/20 0848  . pantoprazole (PROTONIX) EC tablet 80 mg  80 mg Oral Daily Cox, Amy N, DO   80 mg at 01/21/20 1020  . [START ON 01/22/2020] pneumococcal 23 valent vaccine (PNEUMOVAX-23) injection 0.5 mL  0.5 mL Intramuscular Tomorrow-1000 Eugenie Filler, MD      . polyvinyl alcohol (LIQUIFILM TEARS) 1.4 % ophthalmic solution 1 drop  1 drop Both Eyes QID PRN Eugenie Filler, MD      . potassium chloride (KLOR-CON) CR tablet 10 mEq  10 mEq Oral Daily Eugenie Filler, MD   10 mEq at 01/21/20 1020  . pramipexole (MIRAPEX) tablet 1 mg  1 mg Oral QHS Cox, Amy N, DO   1 mg at 01/20/20 2125  . pregabalin (LYRICA) capsule 25 mg  25 mg Oral Daily Eugenie Filler, MD   25 mg at 01/21/20 1020  . senna-docusate (Senokot-S) tablet 1 tablet  1 tablet Oral BID Eugenie Filler, MD   1 tablet at 01/21/20 1020  . simvastatin (ZOCOR) tablet 20 mg  20 mg Oral QPM Cox, Amy N, DO   20 mg at 01/20/20 1754  . timolol (TIMOPTIC) 0.25 % ophthalmic solution 1 drop  1 drop Both Eyes BID Eugenie Filler, MD   1 drop at 01/21/20 1021  . triamcinolone ointment (KENALOG) 0.1 % 1 application  1 application Topical BID PRN Eugenie Filler, MD         Discharge Medications: Please see discharge summary for a list of discharge medications.  Relevant Imaging  Results:  Relevant Lab Results:   Additional Information SS#[id number]  Purcell Mouton, RN

## 2020-01-21 NOTE — Progress Notes (Signed)
PROGRESS NOTE    Dana Bradford  TKZ:[id number] DOB: 1930/08/05 DOA: 01/18/2020 PCP: Jonathon Resides, MD    Chief Complaint  Patient presents with  . Back Pain    Brief Narrative:  HPI per Dr. Tobie Poet H&P obtained from daughter because patient is sleeping status post pain medication  HPI: Dana Bradford is a 86 y.o. female with medical history significant for afib on eliquis 5 mg BID 75 mg BID, hypertension, hypothyroid, presented to the emergency department for chief concerns of left hip pain.  Patient has chronic back pain and it worsened after presentation to ED at [facility]. She was discharged home with treatments for cellulitis.  While being carried from her wheelchair to the car, per daughter states that she was deposited heavily and it resulted in worsening of her left hip.  Patient denies fever, chest pain, shortness of breath, abdominal pain.  Allergies: amlodipine causes angioedema   ED Course: Gust with ED provider, requesting admission for pain control of left hip   Assessment & Plan:   Principal Problem:   Left leg cellulitis Active Problems:   Essential hypertension, benign   GERD (gastroesophageal reflux disease)   OA (osteoarthritis) of hip   Dehydration   Hypotension   Hypothyroidism   Hyperlipidemia   AF (paroxysmal atrial fibrillation) ([place])  1 left lower extremity cellulitis Patient with a left lower extremity cellulitis with swelling and erythema.  Lower extremity Dopplers done negative for DVT.  Plain films of left hip and pelvis negative for any acute fracture.  Plain films of the L-spine with severe end-stage osteoarthritis of the left hip.  CT left hip with no acute osseous abnormality, severe degenerative changes of the left hip.  Continue IV Rocephin for now and will hold off on transitioning to oral antibiotics as patient for probable surgery tomorrow.  Supportive care.  2.  Hypotension Likely secondary to hypovolemia.  Patient  afebrile.  Patient with a leukocytosis on admission that has improved and normalized.  Patient with no overt bleeding.  Patient with no chest pain or no concern for ischemia.  Patient with elevated BUN on admission which is trending down pointing towards dehydration.  Hypotension resolved with hydration.  IV fluids have been saline lock.  Follow.  3.  Hypothyroidism Continue Synthroid.    4.  Advanced end-stage osteoarthritic changes of the left hip Patient being followed by orthopedic surgery in the outpatient setting and scheduled for outpatient surgery on 01/22/2019. Patient with ongoing pain with movement.  Currently two-person per PT/OT.  Patient's end-stage advanced OA leading to pain and difficulty ambulating.  Case discussed with orthopedics, Dr.Aluisio will be kept in the hospital until Wednesday for pain control and also in anticipation of surgery which is scheduled for 01/22/2020.  Follow.   5.  Hypertension We will resume patient's Cardizem.  Follow.   6.  Hyperlipidemia Statin.   7.  Paroxysmal atrial fibrillation Currently rate controlled.  Eliquis on hold in anticipation of surgery 01/22/2020.  8.  Dehydration Improved with hydration.  IV fluids have been saline lock.  May need some gentle hydration in the perioperative period.  Saline lock IV fluids.  9.  GERD Continue Pepcid, PPI.   10.  Hypomagnesemia Repleted.  Magnesium at 2.1.    DVT prophylaxis: Lovenox Code Status: DNR Family Communication: Updated patient.  No family at bedside. Disposition:   Status is: Observation    Dispo: The patient is from: Home  Anticipated d/c is to: Home with home health versus SNF              Anticipated d/c date is: To be determined              Patient currently with advanced end-stage osteoarthritis with significant pain on ambulation, requiring 2 person assist per OT and PT. patient for surgery tomorrow.  Not stable for discharge.       Consultants:    Orthopedics: Dr. Wynelle Link 01/20/2020  Procedures:  Lower extremity Dopplers 01/19/2020  CT left hip 01/18/2020  Plain films of the left hip and pelvis 01/18/2020  Plain films of the L-spine 01/18/2020  Antimicrobials:   IV Rocephin 01/19/2020>>>   Subjective: Patient working with physical therapy.  Patient denies any chest pain.  No shortness of breath.  Feeling better.  Ambulating gingerly in the room with physical therapy.   Objective: Vitals:   01/20/20 1353 01/20/20 2113 01/21/20 0648 01/21/20 1018  BP: 120/82 (!) 120/96 (!) 157/99 (!) 132/97  Pulse: 83 100 74   Resp: 17 18 18    Temp: 98.5 F (36.9 C) 99.1 F (37.3 C) 98.4 F (36.9 C)   TempSrc: Oral Oral Oral   SpO2: 93% 93% 95%   Weight:      Height:        Intake/Output Summary (Last 24 hours) at 01/21/2020 1148 Last data filed at 01/21/2020 1013 Gross per 24 hour  Intake 110 ml  Output 1375 ml  Net -1265 ml   Filed Weights   01/19/20 0315  Weight: 69.3 kg    Examination:  General exam: NAD Respiratory system: Lungs clear to auscultation bilaterally.  No wheezes, no crackles, no rhonchi.  Normal respiratory effort. Cardiovascular system: RRR no murmurs rubs or gallops.  No JVD.  No lower extremity edema.   Gastrointestinal system: Abdomen is soft, nontender, nondistended, positive bowel sounds.  No rebound.  No guarding.  Central nervous system: Alert and oriented. No focal neurological deficits. Extremities: Left lower extremity with improving erythema.  Skin: No rashes, lesions or ulcers Psychiatry: Judgement and insight appear normal. Mood & affect appropriate.     Data Reviewed: I have personally reviewed following labs and imaging studies  CBC: Recent Labs  Lab 01/18/20 0215 01/18/20 1742 01/19/20 0621 01/20/20 0413  WBC 10.6* 11.0* 9.0 9.6  NEUTROABS 6.3 8.1*  --  5.7  HGB 13.1 12.5 10.6* 10.8*  HCT 38.0 37.3 32.5* 33.2*  MCV 94.5 96.1 97.3 97.4  PLT 298 297 282 286     Basic Metabolic Panel: Recent Labs  Lab 01/18/20 0215 01/18/20 1742 01/19/20 0621 01/20/20 0413 01/21/20 0525  NA 136 136 139 139 136  K 3.0* 3.5 3.5 3.8 3.7  CL 98 101 105 106 100  CO2 26 25 26 24 26   GLUCOSE 107* 114* 141* 98 101*  BUN 40* 44* 30* 19 14  CREATININE 1.36* 1.37* 1.05* 0.76 0.85  CALCIUM 9.1 9.1 8.5* 8.3* 8.5*  MG  --   --   --  1.4* 2.1    GFR: Estimated Creatinine Clearance: 38.5 mL/min (by C-G formula based on SCr of 0.85 mg/dL).  Liver Function Tests: No results for input(s): AST, ALT, ALKPHOS, BILITOT, PROT, ALBUMIN in the last 168 hours.  CBG: Recent Labs  Lab 01/15/20 1503  GLUCAP 101*     Recent Results (from the past 240 hour(s))  Surgical pcr screen     Status: None   Collection Time: 01/15/20  3:20 PM  Specimen: Nasal Mucosa; Nasal Swab  Result Value Ref Range Status   MRSA, PCR NEGATIVE NEGATIVE Final   Staphylococcus aureus NEGATIVE NEGATIVE Final    Comment: (NOTE) The Xpert SA Assay (FDA approved for NASAL specimens in patients 84 years of age and older), is one component of a comprehensive surveillance program. It is not intended to diagnose infection nor to guide or monitor treatment. Performed at [facility], [place] [address] [place], [place] [address]   Resp Panel by RT-PCR (Flu A&B, Covid) Nasopharyngeal Swab     Status: None   Collection Time: 01/19/20  1:11 AM   Specimen: Nasopharyngeal Swab; Nasopharyngeal(NP) swabs in vial transport medium  Result Value Ref Range Status   SARS Coronavirus 2 by RT PCR NEGATIVE NEGATIVE Final    Comment: (NOTE) SARS-CoV-2 target nucleic acids are NOT DETECTED.  The SARS-CoV-2 RNA is generally detectable in upper respiratory specimens during the acute phase of infection. The lowest concentration of SARS-CoV-2 viral copies this assay can detect is 138 copies/mL. A negative result does not preclude SARS-Cov-2 infection and should not be used as the sole basis for  treatment or other patient management decisions. A negative result may occur with  improper specimen collection/handling, submission of specimen other than nasopharyngeal swab, presence of viral mutation(s) within the areas targeted by this assay, and inadequate number of viral copies(<138 copies/mL). A negative result must be combined with clinical observations, patient history, and epidemiological information. The expected result is Negative.  Fact Sheet for Patients:  EntrepreneurPulse.com.au  Fact Sheet for Healthcare Providers:  IncredibleEmployment.be  This test is no t yet approved or cleared by the [place] FDA and  has been authorized for detection and/or diagnosis of SARS-CoV-2 by FDA under an Emergency Use Authorization (EUA). This EUA will remain  in effect (meaning this test can be used) for the duration of the COVID-19 declaration under Section 564(b)(1) of the Act, 21 U.S.C.section 360bbb-3(b)(1), unless the authorization is terminated  or revoked sooner.       Influenza A by PCR NEGATIVE NEGATIVE Final   Influenza B by PCR NEGATIVE NEGATIVE Final    Comment: (NOTE) The Xpert Xpress SARS-CoV-2/FLU/RSV plus assay is intended as an aid in the diagnosis of influenza from Nasopharyngeal swab specimens and should not be used as a sole basis for treatment. Nasal washings and aspirates are unacceptable for Xpert Xpress SARS-CoV-2/FLU/RSV testing.  Fact Sheet for Patients: EntrepreneurPulse.com.au  Fact Sheet for Healthcare Providers: IncredibleEmployment.be  This test is not yet approved or cleared by the [place] FDA and has been authorized for detection and/or diagnosis of SARS-CoV-2 by FDA under an Emergency Use Authorization (EUA). This EUA will remain in effect (meaning this test can be used) for the duration of the COVID-19 declaration under Section 564(b)(1) of the Act, 21  U.S.C. section 360bbb-3(b)(1), unless the authorization is terminated or revoked.  Performed at [facility], [place] [address], [place], [place] [address]          Radiology Studies: No results found.      Scheduled Meds: . acetaminophen  500 mg Oral TID  . vitamin C  500 mg Oral BID  . brimonidine  1 drop Both Eyes BID  . cholecalciferol  2,000 Units Oral Daily  . diltiazem  180 mg Oral Daily  . enoxaparin (LOVENOX) injection  40 mg Subcutaneous Daily  . famotidine  40 mg Oral QHS  . latanoprost  1 drop Both Eyes QHS  . levothyroxine  88 mcg Oral QAC breakfast  . mirtazapine  7.5 mg Oral QHS  . pantoprazole  80 mg Oral Daily  . [START ON 01/22/2020] pneumococcal 23 valent vaccine  0.5 mL Intramuscular Tomorrow-1000  . potassium chloride  10 mEq Oral Daily  . pramipexole  1 mg Oral QHS  . pregabalin  25 mg Oral Daily  . senna-docusate  1 tablet Oral BID  . simvastatin  20 mg Oral QPM  . timolol  1 drop Both Eyes BID   Continuous Infusions: . cefTRIAXone (ROCEPHIN)  IV 1 g (01/21/20 0119)     LOS: 2 days    Time spent: 35 minutes    Irine Seal, MD [facility]   To contact the attending provider between 7A-7P or the covering provider during after hours 7P-7A, please log into the web site [email or web address] and access using universal [place] password for that web site. If you do not have the password, please call the hospital operator.  01/21/2020, 11:48 AM

## 2020-01-21 NOTE — Progress Notes (Signed)
Physical Therapy Treatment Patient Details Name: Dana Bradford MRN: [id number] DOB: [birth year] Today's Date: 01/21/2020    History of Present Illness Pt is an 86 y/o female admitted secondary to L hip pain with cellulitis.  PMH including but not limited to Afib (on Eliquis), venous stasis dermatitis, HLD, Hypothyroidism, hypertension, RLS, s/p bilateral knee replacement.    PT Comments    Pt continues to have difficulty advancing R LE during ambulation. She takes a considerable amount of time to get from point A to point B (30+ minutes to walk 15 feet on today). Per pt and chart, she is scheduled to have her hip replacement surgery during this hospital stay. Will continue to follow pt. Will update PT recommendations as necessary.    Follow Up Recommendations  SNF     Equipment Recommendations  Rolling walker with 5" wheels (youth height)    Recommendations for Other Services OT consult     Precautions / Restrictions Precautions Precautions: Fall Restrictions Weight Bearing Restrictions: No    Mobility  Bed Mobility Overal bed mobility: Needs Assistance Bed Mobility: Supine to Sit;Sit to Supine     Supine to sit: Min assist Sit to supine: Mod assist   General bed mobility comments: Min-Mod Assist for LEs. Increased time.  Transfers Overall transfer level: Needs assistance Equipment used: Rolling walker (2 wheeled) Transfers: Sit to/from Stand Sit to Stand: Min assist         General transfer comment: Assist to power up, stabilize, control descent. Increased time.  Ambulation/Gait Ambulation/Gait assistance: Min assist Gait Distance (Feet): 15 Feet Assistive device: Rolling walker (2 wheeled) Gait Pattern/deviations: Step-to pattern;Decreased step length - right;Decreased stride length     General Gait Details: Very, very slow gait speed. Intermittent assist to steady. Pt had more trouble advancing R LE on today compared to yesterday. HR elevated with  ambulation again on today. Cues for safety, RW proximity. Pt tends to want to release RW handle to grab onto bed railing(s) or countertop. Explained to pt that this is not safe.    Stairs             Wheelchair Mobility    Modified Rankin (Stroke Patients Only)       Balance Overall balance assessment: Needs assistance         Standing balance support: Bilateral upper extremity supported Standing balance-Leahy Scale: Poor                              Cognition Arousal/Alertness: Awake/alert Behavior During Therapy: WFL for tasks assessed/performed Overall Cognitive Status: Within Functional Limits for tasks assessed                                        Exercises      General Comments        Pertinent Vitals/Pain Pain Assessment: Faces Faces Pain Scale: Hurts little more Pain Location: generalized pain Pain Descriptors / Indicators: Aching;Discomfort Pain Intervention(s): Monitored during session;Limited activity within patient's tolerance    Home Living                      Prior Function            PT Goals (current goals can now be found in the care plan section) Progress towards PT goals: Progressing toward goals  Frequency    Min 3X/week      PT Plan Current plan remains appropriate    Co-evaluation              AM-PAC PT "6 Clicks" Mobility   Outcome Measure  Help needed turning from your back to your side while in a flat bed without using bedrails?: A Little Help needed moving from lying on your back to sitting on the side of a flat bed without using bedrails?: A Lot Help needed moving to and from a bed to a chair (including a wheelchair)?: A Little Help needed standing up from a chair using your arms (e.g., wheelchair or bedside chair)?: A Little Help needed to walk in hospital room?: A Little Help needed climbing 3-5 steps with a railing? : Total 6 Click Score: 15    End of Session  Equipment Utilized During Treatment: Gait belt Activity Tolerance: Patient limited by fatigue Patient left: in bed;with call bell/phone within reach;with bed alarm set   PT Visit Diagnosis: Muscle weakness (generalized) (M62.81);Other abnormalities of gait and mobility (R26.89)     Time: 1104-1150 PT Time Calculation (min) (ACUTE ONLY): 46 min  Charges:  $Gait Training: 23-37 mins $Therapeutic Activity: 8-22 mins                         Doreatha Massed, PT Acute Rehabilitation  Office: [phone] Pager: [phone]

## 2020-01-21 NOTE — H&P (View-Only) (Signed)
TOTAL HIP ADMISSION H&P  Patient is admitted for left total hip arthroplasty.  Subjective:  Chief Complaint: Left hip pain  HPI: Dana Bradford, 86 y.o. female, has a history of pain and functional disability in the left hip due to arthritis and patient has failed non-surgical conservative treatments for greater than 12 weeks to include NSAID's and/or analgesics and activity modification. Onset of symptoms was gradual, starting several years ago with gradually worsening course since that time. The patient noted no past surgery on the left hip. Patient currently rates pain in the left hip at 5 out of 10 with activity. Patient has worsening of pain with activity and weight bearing and pain that interfers with activities of daily living. 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 This condition presents safety issues increasing the risk of falls.  There is no current active infection.   Patient Active Problem List   Diagnosis Date Noted  . Dehydration 01/19/2020  . Hypotension 01/19/2020  . Hypothyroidism 01/19/2020  . Hyperlipidemia 01/19/2020  . AF (paroxysmal atrial fibrillation) ([place]) 01/19/2020  . Left leg cellulitis 01/18/2020  . Back pain 08/24/2019  . Generalized weakness 08/24/2019  . OA (osteoarthritis) of hip 02/28/2018  . GERD (gastroesophageal reflux disease) 11/13/2012  . Essential hypertension, benign 06/17/2012  . Impaired fasting glucose 06/17/2012  . Other specified acquired hypothyroidism 06/05/2012  . Restless leg syndrome 06/05/2012    Past Medical History:  Diagnosis Date  . Acute kidney failure ([place]) 2016 to 2017 due to vacomycin   no kidney issues now  . Anemia   . Anxiety   . Arthritis   . Atrial fibrillation ([place])   . Depression   . Diabetes mellitus without complication ([place])   . Dyspnea   . Food allergy    Fish ([place])   . Glaucoma     . Hypertension   . Impaired fasting glucose   . Other specified acquired hypothyroidism   . Pneumonia   . Restless leg   . Restless leg   . Skin cancer    Forehead  . Stroke ([place]) 05/2017   ocular stoke on plavix , right eye some vision loss  . Venous stasis ulcer ([place])     Past Surgical History:  Procedure Laterality Date  . ABDOMINAL HYSTERECTOMY  1993   Uterine Prolapse partial  . MASTECTOMY  1974   right breast not sure if cancer 1960's  . REPLACEMENT TOTAL KNEE BILATERAL Bilateral   . SMALL TOE RIGHT FOOT REMOVED  04/2017   DUE TO INFECTION  . Spleenectomy  2000  . TOTAL HIP ARTHROPLASTY Right 02/28/2018   Procedure: RIGHT TOTAL HIP ARTHROPLASTY ANTERIOR APPROACH;  Surgeon: Gaynelle Arabian, MD;  Location: [facility];  Service: Orthopedics;  Laterality: Right;  121min  . TOTAL KNEE ARTHROPLASTY WITH REVISION COMPONENTS      Prior to Admission medications   Medication Sig Start Date End Date Taking? Authorizing Provider  acetaminophen (TYLENOL) 500 MG tablet Take 2,000 mg by mouth daily as needed for mild pain or headache.    Yes [provider]  bismuth subsalicylate (PEPTO BISMOL) 262 MG/15ML suspension Take 30 mLs by mouth every 6 (six) hours as needed for indigestion or diarrhea or loose stools.   Yes [provider]  brimonidine (ALPHAGAN) 0.2 % ophthalmic solution Place 1 drop into both eyes 2 (two) times daily.   Yes [provider]  Cholecalciferol (VITAMIN D3)  50 MCG (2000 UT) TABS Take 2,000 Units by mouth daily.   Yes [provider]  diltiazem (TIAZAC) 180 MG 24 hr capsule Take 180 mg by mouth daily. 07/26/19  Yes [provider]  ELIQUIS 5 MG TABS tablet Take 5 mg by mouth 2 (two) times daily. 08/02/19  Yes [provider]  famotidine (PEPCID) 40 MG tablet Take 40 mg by mouth at bedtime.  08/20/19  Yes [provider]  ferrous sulfate 325 (65 FE) MG tablet Take 325 mg by mouth 2 (two) times daily.   Yes  [provider]  furosemide (LASIX) 20 MG tablet Take 40 mg by mouth daily.    Yes [provider]  irbesartan-hydrochlorothiazide (AVALIDE) 300-12.5 MG tablet Take 1 tablet by mouth daily. 01/09/20  Yes [provider]  latanoprost (XALATAN) 0.005 % ophthalmic solution Place 1 drop into both eyes at bedtime.   Yes [provider]  levothyroxine (SYNTHROID, LEVOTHROID) 88 MCG tablet Take 88 mcg by mouth daily before breakfast.   Yes [provider]  metFORMIN (GLUCOPHAGE) 500 MG tablet Take 500 mg by mouth daily.  06/11/19  Yes [provider]  metoprolol succinate (TOPROL-XL) 25 MG 24 hr tablet Take 3 tablets (75 mg total) by mouth 2 (two) times daily. 08/27/19 01/19/20 Yes Elodia [place]., MD  omeprazole (PRILOSEC) 40 MG capsule Take 40 mg by mouth daily.   Yes [provider]  Polyethyl Glycol-Propyl Glycol (SYSTANE) 0.4-0.3 % GEL ophthalmic gel Place 1 application into both eyes 4 (four) times daily as needed (Dry eyes).    Yes [provider]  pramipexole (MIRAPEX) 1 MG tablet Take 1 mg by mouth at bedtime.    Yes [provider]  simvastatin (ZOCOR) 20 MG tablet Take 20 mg by mouth every evening.   Yes [provider]  timolol (BETIMOL) 0.25 % ophthalmic solution Place 1 drop into both eyes 2 (two) times daily.   Yes [provider]  triamcinolone ointment (KENALOG) 0.1 % Apply 1 application topically 2 (two) times daily as needed (skin irritation.).   Yes [provider]  vitamin C (ASCORBIC ACID) 500 MG tablet Take 500 mg by mouth 2 (two) times daily.   Yes [provider]  cephALEXin (KEFLEX) 500 MG capsule Take 1 capsule (500 mg total) by mouth 3 (three) times daily. 82/50/53   Delora Fuel, MD  potassium chloride (KLOR-CON) 10 MEQ tablet Take 10 mEq by mouth daily. 01/17/20   [provider]    Allergies  Allergen Reactions  . Arthrotec  [Diclofenac-Misoprostol] Shortness Of Breath and Diarrhea  . Doxycycline Nausea And Vomiting and Rash    Significant enough to d/c treatment and refuse to take more Vomiting (intolerance) The first time she took it she had N/V and the second she had a rash.    . Latex Rash and Other (See Comments)  . Amlodipine     angioedema  . Celebrex [Celecoxib] Other (See Comments)    GI Bleeding  . Vancomycin     Acute kidney failure  . Diclofenac Diarrhea  . Shellfish Allergy Itching and Rash    Social History   Socioeconomic History  . Marital status: Widowed    Spouse name: Not on file  . Number of children: 2  . Years of education: 87  . Highest education level: Not on file  Occupational History  . Occupation: Retired   Tobacco Use  . Smoking status: Former Smoker    Packs/day:  1.00    Years: 30.00    Pack years: 30.00    Types: Cigarettes  . Smokeless tobacco: Never Used  . Tobacco comment: qiot 50 yrs ago  Vaping Use  . Vaping Use: Never used  Substance and Sexual Activity  . Alcohol use: Not Currently  . Drug use: No  . Sexual activity: Not on file    Comment: Hysterectomy  Other Topics Concern  . Not on file  Social History Narrative   Marital Status: Widowed    Children:  Daughter Barbaraann Share) Son Renata Gambino)   Pets: None    Living Situation: Lives alone    Occupation: Retired    Education: Programmer, systems    Tobacco:  She quit smoking 35 years ago after having smoked 1 ppd for 20 years.      Alcohol Use:  One glass of wine twice a week     Drug Use:  None   Diet:  Regular   Exercise:  None   Hobbies: Crafting, Gardening.             Social Determinants of Health   Financial Resource Strain:   . Difficulty of Paying Living Expenses: Not on file  Food Insecurity:   . Worried About Charity fundraiser in the Last Year: Not on file  . Ran Out of Food in the Last Year: Not on file  Transportation Needs:   . Lack of Transportation (Medical): Not  on file  . Lack of Transportation (Non-Medical): Not on file  Physical Activity:   . Days of Exercise per Week: Not on file  . Minutes of Exercise per Session: Not on file  Stress:   . Feeling of Stress : Not on file  Social Connections:   . Frequency of Communication with Friends and Family: Not on file  . Frequency of Social Gatherings with Friends and Family: Not on file  . Attends Religious Services: Not on file  . Active Member of Clubs or Organizations: Not on file  . Attends Archivist Meetings: Not on file  . Marital Status: Not on file  Intimate Partner Violence:   . Fear of Current or Ex-Partner: Not on file  . Emotionally Abused: Not on file  . Physically Abused: Not on file  . Sexually Abused: Not on file      Tobacco Use: Medium Risk  . Smoking Tobacco Use: Former Smoker  . Smokeless Tobacco Use: Never Used   Social History   Substance and Sexual Activity  Alcohol Use Not Currently    Family History  Problem Relation Age of Onset  . Cancer Mother   . Heart disease Father   . Hyperlipidemia Father    Review of Systems:  Constitutional: no fever, no chills, no night sweats, no significant weight loss Cardiovascular: no chest pain, no palpitations Respiratory: no cough, no shortness of breath, No COPD Gastrointestinal: no vomiting, no nausea Musculoskeletal: no swelling in Joints, joint pain Neurologic: no numbness, no tingling, no difficulty with balance   Objective:  Physical Exam: Well nourished and well developed. General: Alert and oriented x3, cooperative and pleasant, no acute distress. Head: normocephalic, atraumatic, neck supple. Eyes: EOMI. Respiratory: breath sounds clear in all fields, no wheezing, rales, or rhonchi. Cardiovascular: Regular rate and rhythm, no murmurs, gallops or rubs.  Abdomen: non-tender to palpation and soft, normoactive bowel sounds. Musculoskeletal:  Left Hip Exam:  The range of motion: Flexion to 90  degrees, Internal Rotation to 0  degrees, External Rotation to 0 degrees, and abduction to 5 degrees without discomfort.  There is no tenderness over the greater trochanter bursa.    Calves soft and nontender. Motor function intact in LE. Strength 5/5 LE bilaterally. Neuro: Distal pulses 2+. Sensation to light touch intact in LE.  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 The right hip prosthesis is in excellent position with no periprosthetic abnormalities.   Assessment/Plan:  End stage arthritis, left hip  The patient history, physical examination, clinical judgement of the provider and imaging studies are consistent with end stage degenerative joint disease of the left hip and total hip arthroplasty is deemed medically necessary. The treatment options including medical management, injection therapy, arthroscopy and arthroplasty were discussed at length. The risks and benefits of total hip arthroplasty were presented and reviewed. The risks due to aseptic loosening, infection, stiffness, dislocation/subluxation, thromboembolic complications and other imponderables were discussed. The patient acknowledged the explanation, agreed to proceed with the plan and consent was signed. Patient is being admitted for inpatient treatment for surgery, pain control, PT, OT, prophylactic antibiotics, VTE prophylaxis, progressive ambulation and ADLs and discharge planning.The patient is planning to be discharged home with her daughter. .  Risks and benefits of the  surgery were discussed with the patient and Dr. Wynelle Link at their previous office visit, and the patient has elected to move forward with the aforementioned surgery. Post-operative care plans were discussed with the patient today and all patient questions were answered.   Therapy Plans: HEP Disposition: Home with Daughter Planned DVT Prophylaxis: Patient already taking Eliquis (A-Fib) DME Needed: None PCP: Dr. Dion Saucier (need clearance - patient to contact office), Dr. Elonda Husky (Clearance received 11/26/19) TXA: IV Allergies: Amlodipine, adhesive (rash - did fine with Aquacel previously), NSAIDs (ulcer) Anesthesia Concerns: None BMI: 31.6 Last HgbA1c: 5.9 - takes Metformin  Ph[facility]: [facility])  Patient's anticipated LOS is less than 2 midnights, meeting these requirements: - Younger than 35 - Lives within 1 hour of care - Has a competent adult at home to recover with post-op recover - NO history of  - Chronic pain requiring opiods  - Diabetes  - Coronary Artery Disease  - Heart failure  - Heart attack  - DVT/VTE  - Cardiac arrhythmia  - Respiratory Failure/COPD  - Renal failure  - Advanced Liver disease  - Patient was instructed on what medications to stop prior to surgery - Follow-up visit in 2 weeks with Dr. Wynelle Link - Begin home exercise program following surgery - Pre-operative lab work as pre-surgical testing - Prescriptions will be provided in hospital at time of discharge  Fenton Foy, [place], PA-C Orthopedic Surgery [facility] [place]

## 2020-01-21 NOTE — TOC Progression Note (Signed)
Transition of Care [facility]) - Progression Note    Patient Details  Name: Dana Bradford MRN: [id number] Date of Birth: 03/07/1930  [facility]) CM/SW Contact  Purcell Mouton, RN Phone Number: 01/21/2020, 10:40 AM  Clinical Narrative:     Spoke with pt concerning discharge plans. Pt plan to discharge to SNF for Rehab. Pt agreed to be faxed out to SNF.   Expected Discharge Plan: [place] Barriers to Discharge: No Barriers Identified  Expected Discharge Plan and Services Expected Discharge Plan: [place] arrangements for the past 2 months: Single Family Home                                       Social Determinants of Health (SDOH) Interventions    Readmission Risk Interventions No flowsheet data found.

## 2020-01-21 NOTE — H&P (Signed)
TOTAL HIP ADMISSION H&P  Patient is admitted for left total hip arthroplasty.  Subjective:  Chief Complaint: Left hip pain  HPI: Dana Bradford, 86 y.o. female, has a history of pain and functional disability in the left hip due to arthritis and patient has failed non-surgical conservative treatments for greater than 12 weeks to include NSAID's and/or analgesics and activity modification. Onset of symptoms was gradual, starting several years ago with gradually worsening course since that time. The patient noted no past surgery on the left hip. Patient currently rates pain in the left hip at 5 out of 10 with activity. Patient has worsening of pain with activity and weight bearing and pain that interfers with activities of daily living. 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 This condition presents safety issues increasing the risk of falls.  There is no current active infection.   Patient Active Problem List   Diagnosis Date Noted  . Dehydration 01/19/2020  . Hypotension 01/19/2020  . Hypothyroidism 01/19/2020  . Hyperlipidemia 01/19/2020  . AF (paroxysmal atrial fibrillation) ([place]) 01/19/2020  . Left leg cellulitis 01/18/2020  . Back pain 08/24/2019  . Generalized weakness 08/24/2019  . OA (osteoarthritis) of hip 02/28/2018  . GERD (gastroesophageal reflux disease) 11/13/2012  . Essential hypertension, benign 06/17/2012  . Impaired fasting glucose 06/17/2012  . Other specified acquired hypothyroidism 06/05/2012  . Restless leg syndrome 06/05/2012    Past Medical History:  Diagnosis Date  . Acute kidney failure ([place]) 2016 to 2017 due to vacomycin   no kidney issues now  . Anemia   . Anxiety   . Arthritis   . Atrial fibrillation ([place])   . Depression   . Diabetes mellitus without complication ([place])   . Dyspnea   . Food allergy    Fish ([place])   . Glaucoma     . Hypertension   . Impaired fasting glucose   . Other specified acquired hypothyroidism   . Pneumonia   . Restless leg   . Restless leg   . Skin cancer    Forehead  . Stroke ([place]) 05/2017   ocular stoke on plavix , right eye some vision loss  . Venous stasis ulcer ([place])     Past Surgical History:  Procedure Laterality Date  . ABDOMINAL HYSTERECTOMY  1993   Uterine Prolapse partial  . MASTECTOMY  1974   right breast not sure if cancer 1960's  . REPLACEMENT TOTAL KNEE BILATERAL Bilateral   . SMALL TOE RIGHT FOOT REMOVED  04/2017   DUE TO INFECTION  . Spleenectomy  2000  . TOTAL HIP ARTHROPLASTY Right 02/28/2018   Procedure: RIGHT TOTAL HIP ARTHROPLASTY ANTERIOR APPROACH;  Surgeon: Gaynelle Arabian, MD;  Location: [facility];  Service: Orthopedics;  Laterality: Right;  176min  . TOTAL KNEE ARTHROPLASTY WITH REVISION COMPONENTS      Prior to Admission medications   Medication Sig Start Date End Date Taking? Authorizing Provider  acetaminophen (TYLENOL) 500 MG tablet Take 2,000 mg by mouth daily as needed for mild pain or headache.    Yes [provider]  bismuth subsalicylate (PEPTO BISMOL) 262 MG/15ML suspension Take 30 mLs by mouth every 6 (six) hours as needed for indigestion or diarrhea or loose stools.   Yes [provider]  brimonidine (ALPHAGAN) 0.2 % ophthalmic solution Place 1 drop into both eyes 2 (two) times daily.   Yes [provider]  Cholecalciferol (VITAMIN D3)  50 MCG (2000 UT) TABS Take 2,000 Units by mouth daily.   Yes [provider]  diltiazem (TIAZAC) 180 MG 24 hr capsule Take 180 mg by mouth daily. 07/26/19  Yes [provider]  ELIQUIS 5 MG TABS tablet Take 5 mg by mouth 2 (two) times daily. 08/02/19  Yes [provider]  famotidine (PEPCID) 40 MG tablet Take 40 mg by mouth at bedtime.  08/20/19  Yes [provider]  ferrous sulfate 325 (65 FE) MG tablet Take 325 mg by mouth 2 (two) times daily.   Yes  [provider]  furosemide (LASIX) 20 MG tablet Take 40 mg by mouth daily.    Yes [provider]  irbesartan-hydrochlorothiazide (AVALIDE) 300-12.5 MG tablet Take 1 tablet by mouth daily. 01/09/20  Yes [provider]  latanoprost (XALATAN) 0.005 % ophthalmic solution Place 1 drop into both eyes at bedtime.   Yes [provider]  levothyroxine (SYNTHROID, LEVOTHROID) 88 MCG tablet Take 88 mcg by mouth daily before breakfast.   Yes [provider]  metFORMIN (GLUCOPHAGE) 500 MG tablet Take 500 mg by mouth daily.  06/11/19  Yes [provider]  metoprolol succinate (TOPROL-XL) 25 MG 24 hr tablet Take 3 tablets (75 mg total) by mouth 2 (two) times daily. 08/27/19 01/19/20 Yes Elodia Florence., MD  omeprazole (PRILOSEC) 40 MG capsule Take 40 mg by mouth daily.   Yes [provider]  Polyethyl Glycol-Propyl Glycol (SYSTANE) 0.4-0.3 % GEL ophthalmic gel Place 1 application into both eyes 4 (four) times daily as needed (Dry eyes).    Yes [provider]  pramipexole (MIRAPEX) 1 MG tablet Take 1 mg by mouth at bedtime.    Yes [provider]  simvastatin (ZOCOR) 20 MG tablet Take 20 mg by mouth every evening.   Yes [provider]  timolol (BETIMOL) 0.25 % ophthalmic solution Place 1 drop into both eyes 2 (two) times daily.   Yes [provider]  triamcinolone ointment (KENALOG) 0.1 % Apply 1 application topically 2 (two) times daily as needed (skin irritation.).   Yes [provider]  vitamin C (ASCORBIC ACID) 500 MG tablet Take 500 mg by mouth 2 (two) times daily.   Yes [provider]  cephALEXin (KEFLEX) 500 MG capsule Take 1 capsule (500 mg total) by mouth 3 (three) times daily. 58/09/98   Delora Fuel, MD  potassium chloride (KLOR-CON) 10 MEQ tablet Take 10 mEq by mouth daily. 01/17/20   [provider]    Allergies  Allergen Reactions  . Arthrotec  [Diclofenac-Misoprostol] Shortness Of Breath and Diarrhea  . Doxycycline Nausea And Vomiting and Rash    Significant enough to d/c treatment and refuse to take more Vomiting (intolerance) The first time she took it she had N/V and the second she had a rash.    . Latex Rash and Other (See Comments)  . Amlodipine     angioedema  . Celebrex [Celecoxib] Other (See Comments)    GI Bleeding  . Vancomycin     Acute kidney failure  . Diclofenac Diarrhea  . Shellfish Allergy Itching and Rash    Social History   Socioeconomic History  . Marital status: Widowed    Spouse name: Not on file  . Number of children: 2  . Years of education: 36  . Highest education level: Not on file  Occupational History  . Occupation: Retired   Tobacco Use  . Smoking status: Former Smoker    Packs/day:  1.00    Years: 30.00    Pack years: 30.00    Types: Cigarettes  . Smokeless tobacco: Never Used  . Tobacco comment: qiot 50 yrs ago  Vaping Use  . Vaping Use: Never used  Substance and Sexual Activity  . Alcohol use: Not Currently  . Drug use: No  . Sexual activity: Not on file    Comment: Hysterectomy  Other Topics Concern  . Not on file  Social History Narrative   Marital Status: Widowed    Children:  Daughter Barbaraann Share) Son Samina Weekes)   Pets: None    Living Situation: Lives alone    Occupation: Retired    Education: Programmer, systems    Tobacco:  She quit smoking 35 years ago after having smoked 1 ppd for 20 years.      Alcohol Use:  One glass of wine twice a week     Drug Use:  None   Diet:  Regular   Exercise:  None   Hobbies: Crafting, Gardening.             Social Determinants of Health   Financial Resource Strain:   . Difficulty of Paying Living Expenses: Not on file  Food Insecurity:   . Worried About Charity fundraiser in the Last Year: Not on file  . Ran Out of Food in the Last Year: Not on file  Transportation Needs:   . Lack of Transportation (Medical): Not  on file  . Lack of Transportation (Non-Medical): Not on file  Physical Activity:   . Days of Exercise per Week: Not on file  . Minutes of Exercise per Session: Not on file  Stress:   . Feeling of Stress : Not on file  Social Connections:   . Frequency of Communication with Friends and Family: Not on file  . Frequency of Social Gatherings with Friends and Family: Not on file  . Attends Religious Services: Not on file  . Active Member of Clubs or Organizations: Not on file  . Attends Archivist Meetings: Not on file  . Marital Status: Not on file  Intimate Partner Violence:   . Fear of Current or Ex-Partner: Not on file  . Emotionally Abused: Not on file  . Physically Abused: Not on file  . Sexually Abused: Not on file      Tobacco Use: Medium Risk  . Smoking Tobacco Use: Former Smoker  . Smokeless Tobacco Use: Never Used   Social History   Substance and Sexual Activity  Alcohol Use Not Currently    Family History  Problem Relation Age of Onset  . Cancer Mother   . Heart disease Father   . Hyperlipidemia Father    Review of Systems:  Constitutional: no fever, no chills, no night sweats, no significant weight loss Cardiovascular: no chest pain, no palpitations Respiratory: no cough, no shortness of breath, No COPD Gastrointestinal: no vomiting, no nausea Musculoskeletal: no swelling in Joints, joint pain Neurologic: no numbness, no tingling, no difficulty with balance   Objective:  Physical Exam: Well nourished and well developed. General: Alert and oriented x3, cooperative and pleasant, no acute distress. Head: normocephalic, atraumatic, neck supple. Eyes: EOMI. Respiratory: breath sounds clear in all fields, no wheezing, rales, or rhonchi. Cardiovascular: Regular rate and rhythm, no murmurs, gallops or rubs.  Abdomen: non-tender to palpation and soft, normoactive bowel sounds. Musculoskeletal:  Left Hip Exam:  The range of motion: Flexion to 90  degrees, Internal Rotation to 0  degrees, External Rotation to 0 degrees, and abduction to 5 degrees without discomfort.  There is no tenderness over the greater trochanter bursa.    Calves soft and nontender. Motor function intact in LE. Strength 5/5 LE bilaterally. Neuro: Distal pulses 2+. Sensation to light touch intact in LE.  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 The right hip prosthesis is in excellent position with no periprosthetic abnormalities.   Assessment/Plan:  End stage arthritis, left hip  The patient history, physical examination, clinical judgement of the provider and imaging studies are consistent with end stage degenerative joint disease of the left hip and total hip arthroplasty is deemed medically necessary. The treatment options including medical management, injection therapy, arthroscopy and arthroplasty were discussed at length. The risks and benefits of total hip arthroplasty were presented and reviewed. The risks due to aseptic loosening, infection, stiffness, dislocation/subluxation, thromboembolic complications and other imponderables were discussed. The patient acknowledged the explanation, agreed to proceed with the plan and consent was signed. Patient is being admitted for inpatient treatment for surgery, pain control, PT, OT, prophylactic antibiotics, VTE prophylaxis, progressive ambulation and ADLs and discharge planning.The patient is planning to be discharged home with her daughter. .  Risks and benefits of the  surgery were discussed with the patient and Dr. Wynelle Link at their previous office visit, and the patient has elected to move forward with the aforementioned surgery. Post-operative care plans were discussed with the patient today and all patient questions were answered.   Therapy Plans: HEP Disposition: Home with Daughter Planned DVT Prophylaxis: Patient already taking Eliquis (A-Fib) DME Needed: None PCP: Dr. Dion Saucier (need clearance - patient to contact office), Dr. Elonda Husky (Clearance received 11/26/19) TXA: IV Allergies: Amlodipine, adhesive (rash - did fine with Aquacel previously), NSAIDs (ulcer) Anesthesia Concerns: None BMI: 31.6 Last HgbA1c: 5.9 - takes Metformin  Pharmacy: [facility])  Patient's anticipated LOS is less than 2 midnights, meeting these requirements: - Younger than 59 - Lives within 1 hour of care - Has a competent adult at home to recover with post-op recover - NO history of  - Chronic pain requiring opiods  - Diabetes  - Coronary Artery Disease  - Heart failure  - Heart attack  - DVT/VTE  - Cardiac arrhythmia  - Respiratory Failure/COPD  - Renal failure  - Advanced Liver disease  - Patient was instructed on what medications to stop prior to surgery - Follow-up visit in 2 weeks with Dr. Wynelle Link - Begin home exercise program following surgery - Pre-operative lab work as pre-surgical testing - Prescriptions will be provided in hospital at time of discharge  Fenton Foy, [place], PA-C Orthopedic Surgery [facility] [place]

## 2020-01-22 ENCOUNTER — Inpatient Hospital Stay (HOSPITAL_COMMUNITY): Payer: Medicare Other

## 2020-01-22 ENCOUNTER — Inpatient Hospital Stay (HOSPITAL_COMMUNITY): Payer: Medicare Other | Admitting: Anesthesiology

## 2020-01-22 ENCOUNTER — Inpatient Hospital Stay (HOSPITAL_COMMUNITY): Payer: Medicare Other | Admitting: Physician Assistant

## 2020-01-22 ENCOUNTER — Ambulatory Visit (HOSPITAL_COMMUNITY): Admission: RE | Admit: 2020-01-22 | Payer: Medicare Other | Source: Home / Self Care | Admitting: Orthopedic Surgery

## 2020-01-22 ENCOUNTER — Encounter (HOSPITAL_COMMUNITY): Payer: Self-pay | Admitting: Internal Medicine

## 2020-01-22 ENCOUNTER — Encounter (HOSPITAL_COMMUNITY)
Admission: EM | Disposition: A | Discharge status: Skilled Nursing Facility | Payer: Self-pay | Source: Home / Self Care | Attending: Internal Medicine

## 2020-01-22 DIAGNOSIS — L03116 Cellulitis of left lower limb: Secondary | ICD-10-CM | POA: Diagnosis not present

## 2020-01-22 HISTORY — PX: TOTAL HIP ARTHROPLASTY: SHX124

## 2020-01-22 LAB — TYPE AND SCREEN
ABO/RH(D): A POS
ABO/RH(D): A POS
Antibody Screen: NEGATIVE
Antibody Screen: NEGATIVE

## 2020-01-22 SURGERY — ARTHROPLASTY, HIP, TOTAL, ANTERIOR APPROACH
Anesthesia: Monitor Anesthesia Care | Site: Hip | Laterality: Left

## 2020-01-22 MED ORDER — FERROUS SULFATE 325 (65 FE) MG PO TABS
325.0000 mg | ORAL_TABLET | Freq: Two times a day (BID) | ORAL | Status: DC
  Administered 2020-01-22 – 2020-01-27 (×10): 325 mg via ORAL
  Filled 2020-01-22 (×10): qty 1

## 2020-01-22 MED ORDER — PHENYLEPHRINE HCL-NACL 10-0.9 MG/250ML-% IV SOLN
INTRAVENOUS | Status: DC | PRN
  Administered 2020-01-22: 50 ug/min via INTRAVENOUS

## 2020-01-22 MED ORDER — PROPOFOL 500 MG/50ML IV EMUL
INTRAVENOUS | Status: DC | PRN
  Administered 2020-01-22: 75 ug/kg/min via INTRAVENOUS

## 2020-01-22 MED ORDER — HYDROCODONE-ACETAMINOPHEN 5-325 MG PO TABS
1.0000 | ORAL_TABLET | ORAL | Status: DC | PRN
  Administered 2020-01-23: 1 via ORAL
  Filled 2020-01-22: qty 1

## 2020-01-22 MED ORDER — METOCLOPRAMIDE HCL 5 MG/ML IJ SOLN: 5.0000 mg | Freq: Three times a day (TID) | INTRAMUSCULAR | Status: DC | PRN

## 2020-01-22 MED ORDER — ONDANSETRON HCL 4 MG/2ML IJ SOLN
INTRAMUSCULAR | Status: DC | PRN
  Administered 2020-01-22: 4 mg via INTRAVENOUS

## 2020-01-22 MED ORDER — SODIUM CHLORIDE 0.9 % IV SOLN: INTRAVENOUS | Status: DC

## 2020-01-22 MED ORDER — DILTIAZEM HCL-DEXTROSE 125-5 MG/125ML-% IV SOLN (PREMIX)
5.0000 mg/h | INTRAVENOUS | Status: DC
  Filled 2020-01-22: qty 125

## 2020-01-22 MED ORDER — SODIUM CHLORIDE 0.9 % IV SOLN: 1.0000 g | Freq: Once | INTRAVENOUS | Status: DC

## 2020-01-22 MED ORDER — POLYETHYLENE GLYCOL 3350 17 G PO PACK: 17.0000 g | PACK | Freq: Every day | ORAL | Status: DC | PRN

## 2020-01-22 MED ORDER — PROPOFOL 10 MG/ML IV BOLUS
INTRAVENOUS | Status: DC | PRN
  Administered 2020-01-22: 20 mg via INTRAVENOUS
  Administered 2020-01-22 (×2): 10 mg via INTRAVENOUS

## 2020-01-22 MED ORDER — CEPHALEXIN 250 MG PO CAPS: 250.0000 mg | ORAL_CAPSULE | Freq: Three times a day (TID) | ORAL | Status: DC

## 2020-01-22 MED ORDER — ONDANSETRON HCL 4 MG PO TABS: 4.0000 mg | ORAL_TABLET | Freq: Four times a day (QID) | ORAL | Status: DC | PRN

## 2020-01-22 MED ORDER — CEPHALEXIN 500 MG PO CAPS
500.0000 mg | ORAL_CAPSULE | Freq: Three times a day (TID) | ORAL | Status: DC
  Administered 2020-01-23 – 2020-01-25 (×9): 500 mg via ORAL
  Filled 2020-01-22 (×9): qty 1

## 2020-01-22 MED ORDER — MENTHOL 3 MG MT LOZG: 1.0000 | LOZENGE | OROMUCOSAL | Status: DC | PRN

## 2020-01-22 MED ORDER — CEFAZOLIN SODIUM-DEXTROSE 2-4 GM/100ML-% IV SOLN
2.0000 g | Freq: Four times a day (QID) | INTRAVENOUS | Status: AC
  Administered 2020-01-22 – 2020-01-23 (×2): 2 g via INTRAVENOUS
  Filled 2020-01-22 (×4): qty 100

## 2020-01-22 MED ORDER — APIXABAN 2.5 MG PO TABS
2.5000 mg | ORAL_TABLET | Freq: Two times a day (BID) | ORAL | Status: DC
  Administered 2020-01-23 – 2020-01-24 (×3): 2.5 mg via ORAL
  Filled 2020-01-22 (×3): qty 1

## 2020-01-22 MED ORDER — DEXAMETHASONE SODIUM PHOSPHATE 10 MG/ML IJ SOLN
10.0000 mg | Freq: Once | INTRAMUSCULAR | Status: AC
  Administered 2020-01-23: 10 mg via INTRAVENOUS
  Filled 2020-01-22: qty 1

## 2020-01-22 MED ORDER — METOPROLOL SUCCINATE ER 50 MG PO TB24
75.0000 mg | ORAL_TABLET | Freq: Two times a day (BID) | ORAL | Status: DC
  Administered 2020-01-23: 75 mg via ORAL
  Filled 2020-01-22: qty 1

## 2020-01-22 MED ORDER — DEXAMETHASONE SODIUM PHOSPHATE 10 MG/ML IJ SOLN
8.0000 mg | Freq: Once | INTRAMUSCULAR | Status: AC
  Administered 2020-01-22: 7 mg via INTRAVENOUS

## 2020-01-22 MED ORDER — CEFAZOLIN SODIUM-DEXTROSE 2-4 GM/100ML-% IV SOLN
2.0000 g | INTRAVENOUS | Status: AC
  Administered 2020-01-22: 2 g via INTRAVENOUS
  Filled 2020-01-22: qty 100

## 2020-01-22 MED ORDER — MORPHINE SULFATE (PF) 4 MG/ML IV SOLN: 0.5000 mg | INTRAVENOUS | Status: DC | PRN

## 2020-01-22 MED ORDER — ALBUMIN HUMAN 5 % IV SOLN: INTRAVENOUS | Status: DC | PRN

## 2020-01-22 MED ORDER — 0.9 % SODIUM CHLORIDE (POUR BTL) OPTIME
TOPICAL | Status: DC | PRN
  Administered 2020-01-22: 1000 mL

## 2020-01-22 MED ORDER — DOCUSATE SODIUM 100 MG PO CAPS
100.0000 mg | ORAL_CAPSULE | Freq: Two times a day (BID) | ORAL | Status: DC
  Administered 2020-01-22 – 2020-01-27 (×10): 100 mg via ORAL
  Filled 2020-01-22 (×10): qty 1

## 2020-01-22 MED ORDER — BUPIVACAINE HCL 0.25 % IJ SOLN
INTRAMUSCULAR | Status: DC | PRN
  Administered 2020-01-22: 30 mL

## 2020-01-22 MED ORDER — FENTANYL CITRATE (PF) 100 MCG/2ML IJ SOLN: 25.0000 ug | INTRAMUSCULAR | Status: DC | PRN

## 2020-01-22 MED ORDER — ZOLPIDEM TARTRATE 5 MG PO TABS: 5.0000 mg | ORAL_TABLET | Freq: Every evening | ORAL | Status: DC | PRN

## 2020-01-22 MED ORDER — ONDANSETRON HCL 4 MG/2ML IJ SOLN: 4.0000 mg | Freq: Four times a day (QID) | INTRAMUSCULAR | Status: DC | PRN

## 2020-01-22 MED ORDER — MAGNESIUM CITRATE PO SOLN: 1.0000 | Freq: Once | ORAL | Status: DC | PRN

## 2020-01-22 MED ORDER — FENTANYL CITRATE (PF) 100 MCG/2ML IJ SOLN
INTRAMUSCULAR | Status: DC | PRN
  Administered 2020-01-22 (×2): 50 ug via INTRAVENOUS

## 2020-01-22 MED ORDER — IRBESARTAN 300 MG PO TABS
300.0000 mg | ORAL_TABLET | Freq: Every day | ORAL | Status: DC
  Administered 2020-01-23: 300 mg via ORAL
  Filled 2020-01-22: qty 1

## 2020-01-22 MED ORDER — IRBESARTAN-HYDROCHLOROTHIAZIDE 300-12.5 MG PO TABS: 1.0000 | ORAL_TABLET | Freq: Every day | ORAL | Status: DC

## 2020-01-22 MED ORDER — MEPERIDINE HCL 50 MG/ML IJ SOLN: 6.2500 mg | INTRAMUSCULAR | Status: DC | PRN

## 2020-01-22 MED ORDER — METOCLOPRAMIDE HCL 5 MG PO TABS: 5.0000 mg | ORAL_TABLET | Freq: Three times a day (TID) | ORAL | Status: DC | PRN

## 2020-01-22 MED ORDER — ACETAMINOPHEN 160 MG/5ML PO SOLN: 325.0000 mg | ORAL | Status: DC | PRN

## 2020-01-22 MED ORDER — ONDANSETRON HCL 4 MG/2ML IJ SOLN: 4.0000 mg | Freq: Once | INTRAMUSCULAR | Status: DC | PRN

## 2020-01-22 MED ORDER — LACTATED RINGERS IV SOLN: INTRAVENOUS | Status: DC

## 2020-01-22 MED ORDER — TRANEXAMIC ACID-NACL 1000-0.7 MG/100ML-% IV SOLN
1000.0000 mg | INTRAVENOUS | Status: AC
  Administered 2020-01-22: 1000 mg via INTRAVENOUS
  Filled 2020-01-22: qty 100

## 2020-01-22 MED ORDER — PHENOL 1.4 % MT LIQD: 1.0000 | OROMUCOSAL | Status: DC | PRN

## 2020-01-22 MED ORDER — BISACODYL 10 MG RE SUPP: 10.0000 mg | Freq: Every day | RECTAL | Status: DC | PRN

## 2020-01-22 MED ORDER — OXYCODONE HCL 5 MG/5ML PO SOLN: 5.0000 mg | Freq: Once | ORAL | Status: DC | PRN

## 2020-01-22 MED ORDER — CHLORHEXIDINE GLUCONATE CLOTH 2 % EX PADS
6.0000 | MEDICATED_PAD | Freq: Every day | CUTANEOUS | Status: DC
  Administered 2020-01-23 – 2020-01-24 (×2): 6 via TOPICAL

## 2020-01-22 MED ORDER — METHOCARBAMOL 500 MG PO TABS
500.0000 mg | ORAL_TABLET | Freq: Four times a day (QID) | ORAL | Status: DC | PRN
  Administered 2020-01-23 – 2020-01-25 (×3): 500 mg via ORAL
  Filled 2020-01-22 (×3): qty 1

## 2020-01-22 MED ORDER — FUROSEMIDE 20 MG PO TABS
20.0000 mg | ORAL_TABLET | Freq: Every day | ORAL | Status: DC
  Filled 2020-01-22: qty 1

## 2020-01-22 MED ORDER — ACETAMINOPHEN 325 MG PO TABS: 325.0000 mg | ORAL_TABLET | ORAL | Status: DC | PRN

## 2020-01-22 MED ORDER — BUPIVACAINE IN DEXTROSE 0.75-8.25 % IT SOLN
INTRATHECAL | Status: DC | PRN
  Administered 2020-01-22: 1.4 mL via INTRATHECAL

## 2020-01-22 MED ORDER — WATER FOR IRRIGATION, STERILE IR SOLN
Status: DC | PRN
  Administered 2020-01-22: 2000 mL

## 2020-01-22 MED ORDER — ACETAMINOPHEN 10 MG/ML IV SOLN
1000.0000 mg | Freq: Once | INTRAVENOUS | Status: AC
  Administered 2020-01-22: 1000 mg via INTRAVENOUS
  Filled 2020-01-22: qty 100

## 2020-01-22 MED ORDER — TRAMADOL HCL 50 MG PO TABS
50.0000 mg | ORAL_TABLET | Freq: Four times a day (QID) | ORAL | Status: DC | PRN
  Administered 2020-01-22: 50 mg via ORAL
  Administered 2020-01-23 – 2020-01-24 (×2): 100 mg via ORAL
  Administered 2020-01-25: 50 mg via ORAL
  Administered 2020-01-27: 100 mg via ORAL
  Filled 2020-01-22: qty 1
  Filled 2020-01-22 (×2): qty 2
  Filled 2020-01-22: qty 1
  Filled 2020-01-22 (×2): qty 2

## 2020-01-22 MED ORDER — OXYCODONE HCL 5 MG PO TABS: 5.0000 mg | ORAL_TABLET | Freq: Once | ORAL | Status: DC | PRN

## 2020-01-22 MED ORDER — DILTIAZEM LOAD VIA INFUSION
5.0000 mg | Freq: Once | INTRAVENOUS | Status: DC
  Filled 2020-01-22: qty 5

## 2020-01-22 MED ORDER — POVIDONE-IODINE 10 % EX SWAB
2.0000 "application " | Freq: Once | CUTANEOUS | Status: AC
  Administered 2020-01-22: 2 via TOPICAL

## 2020-01-22 MED ORDER — HYDROCHLOROTHIAZIDE 12.5 MG PO CAPS
12.5000 mg | ORAL_CAPSULE | Freq: Every day | ORAL | Status: DC
  Filled 2020-01-22: qty 1

## 2020-01-22 MED ORDER — ACETAMINOPHEN 325 MG PO TABS
325.0000 mg | ORAL_TABLET | Freq: Four times a day (QID) | ORAL | Status: DC | PRN
  Administered 2020-01-24 – 2020-01-26 (×2): 650 mg via ORAL
  Filled 2020-01-22 (×2): qty 2

## 2020-01-22 MED ORDER — MORPHINE SULFATE (PF) 2 MG/ML IV SOLN: 1.0000 mg | INTRAVENOUS | Status: DC | PRN

## 2020-01-22 MED ORDER — METHOCARBAMOL 500 MG IVPB - SIMPLE MED
500.0000 mg | Freq: Four times a day (QID) | INTRAVENOUS | Status: DC | PRN
  Administered 2020-01-23: 500 mg via INTRAVENOUS
  Filled 2020-01-22: qty 500

## 2020-01-22 SURGICAL SUPPLY — 44 items
BAG DECANTER FOR FLEXI CONT (MISCELLANEOUS) IMPLANT
BAG ZIPLOCK 12X15 (MISCELLANEOUS) IMPLANT
BLADE SAG 18X100X1.27 (BLADE) ×3 IMPLANT
CLOSURE WOUND 1/2 X4 (GAUZE/BANDAGES/DRESSINGS) ×1
COVER PERINEAL POST (MISCELLANEOUS) ×3 IMPLANT
COVER SURGICAL LIGHT HANDLE (MISCELLANEOUS) ×3 IMPLANT
COVER WAND RF STERILE (DRAPES) IMPLANT
CUP ACETBLR 48 OD SECTOR II (Hips) ×2 IMPLANT
DECANTER SPIKE VIAL GLASS SM (MISCELLANEOUS) ×3 IMPLANT
DRAPE STERI IOBAN 125X83 (DRAPES) ×3 IMPLANT
DRAPE U-SHAPE 47X51 STRL (DRAPES) ×6 IMPLANT
DRSG ADAPTIC 3X8 NADH LF (GAUZE/BANDAGES/DRESSINGS) ×3 IMPLANT
DRSG AQUACEL AG ADV 3.5X10 (GAUZE/BANDAGES/DRESSINGS) ×3 IMPLANT
DURAPREP 26ML APPLICATOR (WOUND CARE) ×3 IMPLANT
ELECT REM PT RETURN 15FT ADLT (MISCELLANEOUS) ×3 IMPLANT
EVACUATOR 1/8 PVC DRAIN (DRAIN) IMPLANT
GLOVE BIO SURGEON STRL SZ 6 (GLOVE) IMPLANT
GLOVE BIO SURGEON STRL SZ7 (GLOVE) IMPLANT
GLOVE BIO SURGEON STRL SZ8 (GLOVE) ×3 IMPLANT
GLOVE BIOGEL PI IND STRL 6.5 (GLOVE) IMPLANT
GLOVE BIOGEL PI IND STRL 7.0 (GLOVE) IMPLANT
GLOVE BIOGEL PI IND STRL 8 (GLOVE) ×1 IMPLANT
GLOVE BIOGEL PI INDICATOR 6.5 (GLOVE)
GLOVE BIOGEL PI INDICATOR 7.0 (GLOVE)
GLOVE BIOGEL PI INDICATOR 8 (GLOVE) ×2
GOWN STRL REUS W/TWL LRG LVL3 (GOWN DISPOSABLE) ×3 IMPLANT
GOWN STRL REUS W/TWL XL LVL3 (GOWN DISPOSABLE) IMPLANT
HEAD FEM STD 28X+1.5 STRL (Hips) ×2 IMPLANT
HOLDER FOLEY CATH W/STRAP (MISCELLANEOUS) ×3 IMPLANT
KIT TURNOVER KIT A (KITS) IMPLANT
LINER MARATHON 28 48 (Hips) ×2 IMPLANT
MANIFOLD NEPTUNE II (INSTRUMENTS) ×3 IMPLANT
PACK ANTERIOR HIP CUSTOM (KITS) ×3 IMPLANT
PENCIL SMOKE EVACUATOR COATED (MISCELLANEOUS) ×3 IMPLANT
STEM FEMORAL SZ 6MM STD ACTIS (Stem) ×2 IMPLANT
STRIP CLOSURE SKIN 1/2X4 (GAUZE/BANDAGES/DRESSINGS) ×2 IMPLANT
SUT ETHIBOND NAB CT1 #1 30IN (SUTURE) ×3 IMPLANT
SUT MNCRL AB 4-0 PS2 18 (SUTURE) ×3 IMPLANT
SUT STRATAFIX 0 PDS 27 VIOLET (SUTURE) ×3
SUT VIC AB 2-0 CT1 27 (SUTURE) ×4
SUT VIC AB 2-0 CT1 TAPERPNT 27 (SUTURE) ×2 IMPLANT
SUTURE STRATFX 0 PDS 27 VIOLET (SUTURE) ×1 IMPLANT
SYR 50ML LL SCALE MARK (SYRINGE) IMPLANT
TRAY FOLEY MTR SLVR 16FR STAT (SET/KITS/TRAYS/PACK) ×3 IMPLANT

## 2020-01-22 NOTE — Anesthesia Preprocedure Evaluation (Addendum)
Anesthesia Evaluation  Patient identified by MRN, date of birth, ID band Patient awake    Reviewed: Allergy & Precautions, NPO status , Patient's Chart, lab work & pertinent test results  History of Anesthesia Complications Negative for: history of anesthetic complications  Airway Mallampati: II  TM Distance: >3 FB Neck ROM: Full    Dental  (+) Dental Advisory Given   Pulmonary neg shortness of breath, neg sleep apnea, neg COPD, neg recent URI, former smoker,    breath sounds clear to auscultation       Cardiovascular hypertension, Pt. on medications (-) angina(-) Past MI and (-) CHF + dysrhythmias Atrial Fibrillation  Rhythm:Regular  CV: Echo 06/26/17 (Care Everywhere)  Left ventricular systolic function is normal. LV ejection fraction = 55-60%. The right ventricle is normal size. The right ventricular systolic function is normal. The left atrium is moderately dilated. No significant stenosis or regurgitation seen There was insufficient TR detected to calculate RV systolic pressure. Estimated right atrial pressure is 10 mmHg.Marland Kitchen There is no pericardial effusion. There is no comparison study available.  Carotid Doppler 06/09/17 (Care Everywhere):  IMPRESSION: Minor carotid atherosclerosis. No hemodynamically significant ICA stenosis. Degree of narrowing less than 50% bilaterally by ultrasound criteria.   Neuro/Psych neg Seizures PSYCHIATRIC DISORDERS Anxiety Depression CVA, No Residual Symptoms    GI/Hepatic Neg liver ROS, GERD  ,  Endo/Other  diabetesHypothyroidism   Renal/GU Renal disease     Musculoskeletal  (+) Arthritis ,   Abdominal   Peds  Hematology  (+) Blood dyscrasia, anemia ,   Anesthesia Other Findings   Reproductive/Obstetrics                            Anesthesia Physical  Anesthesia Plan  ASA: III  Anesthesia Plan: MAC and Spinal   Post-op Pain Management:     Induction:   PONV Risk Score and Plan: 2 and Treatment may vary due to age or medical condition and Propofol infusion  Airway Management Planned: Nasal Cannula, Natural Airway, Simple Face Mask and Mask  Additional Equipment: None  Intra-op Plan:   Post-operative Plan:   Informed Consent: I have reviewed the patients History and Physical, chart, labs and discussed the procedure including the risks, benefits and alternatives for the proposed anesthesia with the patient or authorized representative who has indicated his/her understanding and acceptance.     Dental advisory given  Plan Discussed with: Surgeon, CRNA and Anesthesiologist  Anesthesia Plan Comments: (See PST note 02/26/2018, Konrad Felix, PA-C)        Anesthesia Quick Evaluation

## 2020-01-22 NOTE — Anesthesia Procedure Notes (Signed)
Spinal  Patient location during procedure: OR Start time: 01/22/2020 2:20 PM End time: 01/22/2020 2:23 PM Staffing Anesthesiologist: Janeece Riggers, MD Preanesthetic Checklist Completed: patient identified, IV checked, site marked, risks and benefits discussed, surgical consent, monitors and equipment checked, pre-op evaluation and timeout performed Spinal Block Patient position: sitting Prep: DuraPrep Patient monitoring: heart rate, cardiac monitor, continuous pulse ox and blood pressure Approach: midline Location: L3-4 Injection technique: single-shot Needle Needle type: Sprotte  Needle gauge: 22 G Needle length: 9 cm Assessment Sensory level: T4 Additional Notes Multiple attempts + CSF / DOSE / +CSF ASP

## 2020-01-22 NOTE — Transfer of Care (Signed)
Immediate Anesthesia Transfer of Care Note  Patient: Dana Bradford  Procedure(s) Performed: TOTAL HIP ARTHROPLASTY ANTERIOR APPROACH (Left Hip)  Patient Location: PACU  Anesthesia Type:Spinal  Level of Consciousness: awake, alert , oriented and patient cooperative  Airway & Oxygen Therapy: Patient Spontanous Breathing and Patient connected to face mask oxygen  Post-op Assessment: Report given to RN and Post -op Vital signs reviewed and stable  Post vital signs: stable  Last Vitals:  Vitals Value Taken Time  BP    Temp    Pulse    Resp    SpO2      Last Pain:  Vitals:   01/22/20 1054  TempSrc:   PainSc: 4       Patients Stated Pain Goal: 3 (12/16/46 6282)  Complications: No complications documented.

## 2020-01-22 NOTE — Interval H&P Note (Signed)
History and Physical Interval Note:  01/22/2020 1:08 PM  Dana Bradford  has presented today for surgery, with the diagnosis of left hip osteoarthritis.  The various methods of treatment have been discussed with the patient and family. After consideration of risks, benefits and other options for treatment, the patient has consented to  Procedure(s) with comments: [place] (Left) - 152min as a surgical intervention.  The patient's history has been reviewed, patient examined, no change in status, stable for surgery.  I have reviewed the patient's chart and labs.  Questions were answered to the patient's satisfaction.     Pilar Plate Shahan Starks

## 2020-01-22 NOTE — Anesthesia Postprocedure Evaluation (Signed)
Anesthesia Post Note  Patient: Dana Bradford  Procedure(s) Performed: TOTAL HIP ARTHROPLASTY ANTERIOR APPROACH (Left Hip)     Patient location during evaluation: PACU Anesthesia Type: MAC Level of consciousness: awake and alert Pain management: pain level controlled Vital Signs Assessment: post-procedure vital signs reviewed and stable Respiratory status: spontaneous breathing, nonlabored ventilation, respiratory function stable and patient connected to nasal cannula oxygen Cardiovascular status: stable and blood pressure returned to baseline Postop Assessment: no apparent nausea or vomiting Anesthetic complications: no   No complications documented.  Last Vitals:  Vitals:   01/22/20 1730 01/22/20 1740  BP: (!) 134/98   Pulse: (!) 105 (!) 112  Resp: 17 12  Temp:    SpO2: 98% 94%    Last Pain:  Vitals:   01/22/20 1730  TempSrc:   PainSc: 0-No pain                 Samoria Fedorko

## 2020-01-22 NOTE — Progress Notes (Signed)
[facility] Progress Note  Patient: Dana Bradford    NOM:[id number]  DOA: 01/18/2020     Date of Service: the patient was seen and examined on 01/22/2020  Brief hospital course: Past medical history of paroxysmal A. fib on Eliquis, HTN, hypothyroidism.  Presents with complaints of hip pain and cellulitis.  Started on IV antibiotics.  Patient has severe osteoarthritis of the hip and undergoing hip arthroplasty on 01/22/2020. Currently plan is monitor postop recovery.  Assessment and Plan: 1.  Left leg cellulitis Treated with IV ceftriaxone. X-ray negative for any acute fracture. CT shows severe osteoarthritis. Continue antibiotics 2 days postop for total of 7 days.  2.  Hypertension Hypotension from hypovolemia Given IV fluid with improvement. Blood pressure stable. Monitor.  3.  Hypothyroidism Continue Synthroid  4.  End-stage left hip osteoarthritis Underwent procedure on 01/22/2020. PT OT currently recommends SNF. Dr. Wynelle Link will take over postop.  5.  HLD Continue statin  6.  Paroxysmal A. fib Resume Eliquis postop once appropriate. Currently rate controlled.  7.  Obesity Placing the patient at poor outcome secondary to her osteoarthritis. Dietary consultation postop recommended. Body mass index is 31.94 kg/m.   Diet: Cardiac diet DVT Prophylaxis:   SCDs Start: 01/22/20 1633 Place TED hose Start: 01/22/20 1633 TED hose to OR Start: 01/22/20 1201 Sequential compression device to OR Start: 01/22/20 1201 enoxaparin (LOVENOX) injection 40 mg Start: 01/21/20 1000 Place TED hose Start: 01/19/20 0035    Advance goals of care discussion: DNR  Family Communication: no family was present at bedside, at the time of interview.   Disposition:  Status is: Inpatient  Remains inpatient appropriate because:Ongoing diagnostic testing needed not appropriate for outpatient work up   Dispo: The patient is from: Home              Anticipated d/c is to: SNF               Anticipated d/c date is: 2 days              Patient currently is not medically stable to d/c.  Subjective: Pain well controlled.  No nausea no vomiting.  No fever no chills.  Left leg cellulitis improving.  Swelling in the left leg.  Physical Exam:  General: Appear in mild distress, no Rash; Oral Mucosa Clear, moist. no Abnormal Neck Mass Or lumps, Conjunctiva normal  Cardiovascular: S1 and S2 Present, no Murmur, Respiratory: good respiratory effort, Bilateral Air entry present and CTA, no Crackles, no wheezes Abdomen: Bowel Sound present, Soft and no tenderness Extremities: left Pedal edema Neurology: alert and oriented to time, place, and person affect appropriate. no new focal deficit Gait not checked due to patient safety concerns  Vitals:   01/22/20 1630 01/22/20 1645 01/22/20 1700 01/22/20 1715  BP: 117/72 101/82 109/83 118/87  Pulse: (!) 120   (!) 112  Resp: 15 14 14 12   Temp:      TempSrc:      SpO2: 98% 97% 98% 100%  Weight:      Height:        Intake/Output Summary (Last 24 hours) at 01/22/2020 1729 Last data filed at 01/22/2020 1539 Gross per 24 hour  Intake 650 ml  Output 1325 ml  Net -675 ml   Filed Weights   01/19/20 0315  Weight: 69.3 kg    Data Reviewed: I have personally reviewed and interpreted daily labs, tele strips, imagings as discussed above. I reviewed all nursing notes, pharmacy notes, vitals, pertinent  old records I have discussed plan of care as described above with RN and patient/family.  CBC: Recent Labs  Lab 01/18/20 0215 01/18/20 1742 01/19/20 0621 01/20/20 0413  WBC 10.6* 11.0* 9.0 9.6  NEUTROABS 6.3 8.1*  --  5.7  HGB 13.1 12.5 10.6* 10.8*  HCT 38.0 37.3 32.5* 33.2*  MCV 94.5 96.1 97.3 97.4  PLT 298 297 282 967   Basic Metabolic Panel: Recent Labs  Lab 01/18/20 0215 01/18/20 1742 01/19/20 0621 01/20/20 0413 01/21/20 0525  NA 136 136 139 139 136  K 3.0* 3.5 3.5 3.8 3.7  CL 98 101 105 106 100  CO2 26 25 26 24 26     GLUCOSE 107* 114* 141* 98 101*  BUN 40* 44* 30* 19 14  CREATININE 1.36* 1.37* 1.05* 0.76 0.85  CALCIUM 9.1 9.1 8.5* 8.3* 8.5*  MG  --   --   --  1.4* 2.1    Studies: DG C-Arm 1-60 Min-No Report  Result Date: 01/22/2020 Fluoroscopy was utilized by the requesting physician.  No radiographic interpretation.   DG HIP OPERATIVE UNILAT W OR W/O PELVIS LEFT  Result Date: 01/22/2020 CLINICAL DATA:  86 year old female status post left hip arthroplasty. EXAM: OPERATIVE left HIP (WITH PELVIS IF PERFORMED) one view TECHNIQUE: Fluoroscopic spot image(s) were submitted for interpretation post-operatively. COMPARISON:  Pelvic radiograph dated 01/18/2020. FINDINGS: Two intraoperative fluoroscopic images provided. The total fluoroscopic time is 10 second and cumulative air Karma of 1.4 mGy. There has been left hip arthroplasty. A right hip arthroplasty is also noted. IMPRESSION: Status post left hip arthroplasty. Electronically Signed   By: Anner Crete M.D.   On: 01/22/2020 17:00    Scheduled Meds: . [MAR Hold] acetaminophen  500 mg Oral TID  . [MAR Hold] vitamin C  500 mg Oral BID  . [MAR Hold] brimonidine  1 drop Both Eyes BID  . [START ON 01/23/2020] cephALEXin  250 mg Oral Q8H  . [MAR Hold] cholecalciferol  2,000 Units Oral Daily  . [MAR Hold] diltiazem  180 mg Oral Daily  . diltiazem  5 mg Intravenous Once  . [MAR Hold] enoxaparin (LOVENOX) injection  40 mg Subcutaneous Daily  . [MAR Hold] famotidine  40 mg Oral QHS  . [MAR Hold] latanoprost  1 drop Both Eyes QHS  . [MAR Hold] levothyroxine  88 mcg Oral QAC breakfast  . [MAR Hold] mirtazapine  7.5 mg Oral QHS  . [MAR Hold] pantoprazole  80 mg Oral Daily  . pneumococcal 23 valent vaccine  0.5 mL Intramuscular Tomorrow-1000  . [MAR Hold] potassium chloride  10 mEq Oral Daily  . [MAR Hold] pramipexole  1 mg Oral QHS  . [MAR Hold] pregabalin  25 mg Oral Daily  . [MAR Hold] senna-docusate  1 tablet Oral BID  . [MAR Hold] simvastatin  20 mg  Oral QPM  . [MAR Hold] timolol  1 drop Both Eyes BID   Continuous Infusions: . diltiazem (CARDIZEM) infusion    . lactated ringers 900 mL/hr at 01/22/20 1613  . methocarbamol (ROBAXIN) IV     PRN Meds: 0.9 % irrigation (POUR BTL), acetaminophen **OR** acetaminophen (TYLENOL) oral liquid 160 mg/5 mL, [MAR Hold] acetaminophen **OR** [MAR Hold] acetaminophen, bupivacaine, fentaNYL (SUBLIMAZE) injection, meperidine (DEMEROL) injection, methocarbamol **OR** methocarbamol (ROBAXIN) IV, [MAR Hold]  morphine injection, [MAR Hold] ondansetron **OR** [MAR Hold] ondansetron (ZOFRAN) IV, ondansetron (ZOFRAN) IV, [MAR Hold] oxyCODONE, oxyCODONE **OR** oxyCODONE, [MAR Hold] polyvinyl alcohol, [MAR Hold] triamcinolone ointment, water for irrigation, sterile, [MAR Hold] zolpidem  Time  spent: 35 minutes  Author: Berle Mull, MD [facility] 01/22/2020 5:29 PM  To reach On-call, see care teams to locate the attending and reach out via [email or web address]. Between 7PM-7AM, please contact night-coverage If you still have difficulty reaching the attending provider, please page the [facility] (Director on Call) for [facility] on amion for assistance.

## 2020-01-22 NOTE — Anesthesia Procedure Notes (Signed)
Procedure Name: MAC Date/Time: 01/22/2020 2:09 PM Performed by: Lissa Morales, CRNA Pre-anesthesia Checklist: Patient identified, Emergency Drugs available, Suction available, Patient being monitored and Timeout performed Patient Re-evaluated:Patient Re-evaluated prior to induction Oxygen Delivery Method: Simple face mask Placement Confirmation: positive ETCO2

## 2020-01-22 NOTE — Op Note (Signed)
OPERATIVE REPORT- TOTAL HIP ARTHROPLASTY   PREOPERATIVE DIAGNOSIS: Osteoarthritis of the Left hip.   POSTOPERATIVE DIAGNOSIS: Osteoarthritis of the Left  hip.   PROCEDURE: Left total hip arthroplasty, anterior approach.   SURGEON: Gaynelle Arabian, MD   ASSISTANT: Theresa Duty, PA-C  ANESTHESIA:  Spinal  ESTIMATED BLOOD LOSS:-300 mL    DRAINS: Hemovac x1.   COMPLICATIONS: None   CONDITION: PACU - hemodynamically stable.   BRIEF CLINICAL NOTE: Dana Bradford is a 86 y.o. female who has advanced end-  stage arthritis of their Left  hip with progressively worsening pain and  dysfunction.The patient has failed nonoperative management and presents for  total hip arthroplasty.   PROCEDURE IN DETAIL: After successful administration of spinal  anesthetic, the traction boots for the [facility] bed were placed on both  feet and the patient was placed onto the [facility] bed, boots placed into the leg  holders. The Left hip was then isolated from the perineum with plastic  drapes and prepped and draped in the usual sterile fashion. ASIS and  greater trochanter were marked and a oblique incision was made, starting  at about 1 cm lateral and 2 cm distal to the ASIS and coursing towards  the anterior cortex of the femur. The skin was cut with a 10 blade  through subcutaneous tissue to the level of the fascia overlying the  tensor fascia lata muscle. The fascia was then incised in line with the  incision at the junction of the anterior third and posterior 2/3rd. The  muscle was teased off the fascia and then the interval between the TFL  and the rectus was developed. The Hohmann retractor was then placed at  the top of the femoral neck over the capsule. The vessels overlying the  capsule were cauterized and the fat on top of the capsule was removed.  A Hohmann retractor was then placed anterior underneath the rectus  femoris to give exposure to the entire anterior capsule. A T-shaped   capsulotomy was performed. The edges were tagged and the femoral head  was identified.       Osteophytes are removed off the superior acetabulum.  The femoral neck was then cut in situ with an oscillating saw. Traction  was then applied to the left lower extremity utilizing the [facility]  traction. The femoral head was then removed. Retractors were placed  around the acetabulum and then circumferential removal of the labrum was  performed. Osteophytes were also removed. Reaming starts at 45 mm to  medialize and  Increased in 2 mm increments to 47 mm. We reamed in  approximately 40 degrees of abduction, 20 degrees anteversion. A 48 mm  pinnacle acetabular shell was then impacted in anatomic position under  fluoroscopic guidance with excellent purchase. We did not need to place  any additional dome screws. A 28 MM neutral + 4 marathon liner was then  placed into the acetabular shell.       The femoral lift was then placed along the lateral aspect of the femur  just distal to the vastus ridge. The leg was  externally rotated and capsule  was stripped off the inferior aspect of the femoral neck down to the  level of the lesser trochanter, this was done with electrocautery. The femur was lifted after this was performed. The  leg was then placed in an extended and adducted position essentially delivering the femur. We also removed the capsule superiorly and the piriformis from the piriformis fossa  to gain excellent exposure of the  proximal femur. Rongeur was used to remove some cancellous bone to get  into the lateral portion of the proximal femur for placement of the  initial starter reamer. The starter broaches was placed  the starter broach  and was shown to go down the center of the canal. Broaching  with the Actis system was then performed starting at size 0  coursing  Up to size 6. A size 6 had excellent torsional and rotational  and axial stability. The trial standard offset neck was then  placed  with a 28 + 1.5 trial head. The hip was then reduced. We confirmed that  the stem was in the canal both on AP and lateral x-rays. It also has excellent sizing. The hip was reduced with outstanding stability through full extension and full external rotation.. AP pelvis was taken and the leg lengths were measured and found to be equal. Hip was then dislocated again and the femoral head and neck removed. The  femoral broach was removed. Size 6 Actis stem with a standard offset  neck was then impacted into the femur following native anteversion. Has  excellent purchase in the canal. Excellent torsional and rotational and  axial stability. It is confirmed to be in the canal on AP and lateral  fluoroscopic views. The 28 + 1.5 metal head was placed and the hip  reduced with outstanding stability. Again AP pelvis was taken and it  confirmed that the leg lengths were equal. The wound was then copiously  irrigated with saline solution and the capsule reattached and repaired  with Ethibond suture. 30 ml of .25% Bupivicaine was  injected into the capsule and into the edge of the tensor fascia lata as well as subcutaneous tissue. The fascia overlying the tensor fascia lata was then closed with a running #1 V-Loc. Subcu was closed with interrupted 2-0 Vicryl and subcuticular running 4-0 Monocryl. Incision was cleaned  and dried. Steri-Strips and a bulky sterile dressing applied. Hemovac  drain was hooked to suction and then the patient was awakened and transported to  recovery in stable condition.        Please note that a surgical assistant was a medical necessity for this procedure to perform it in a safe and expeditious manner. Assistant was necessary to provide appropriate retraction of vital neurovascular structures and to prevent femoral fracture and allow for anatomic placement of the prosthesis.  Gaynelle Arabian, M.D.

## 2020-01-22 NOTE — Care Management Important Message (Signed)
Important Message  Patient Details IM Letter given to the Patient. Name: Dana Bradford MRN: [id number] Date of Birth: 31-Jan-1931   Medicare Important Message Given:  Yes     [name] 01/22/2020, 10:36 AM

## 2020-01-22 NOTE — Discharge Instructions (Signed)
Dana Aluisio, MD [facility] Specialist [facility] [address] [place], [place] [address] [phone]  ANTERIOR APPROACH TOTAL HIP REPLACEMENT POSTOPERATIVE DIRECTIONS     Hip Rehabilitation, Guidelines Following Surgery  The results of a hip operation are greatly improved after range of motion and muscle strengthening exercises. Follow all safety measures which are given to protect your hip. If any of these exercises cause increased pain or swelling in your joint, decrease the amount until you are comfortable again. Then slowly increase the exercises. Call your caregiver if you have problems or questions.   HOME CARE INSTRUCTIONS  . Remove items at home which could result in a fall. This includes throw rugs or furniture in walking pathways.   ICE to the affected hip as frequently as 20-30 minutes an hour and then as needed for pain and swelling. Continue to use ice on the hip for pain and swelling from surgery. You may notice swelling that will progress down to the foot and ankle. This is normal after surgery. Elevate the leg when you are not up walking on it.    Continue to use the breathing machine which will help keep your temperature down.  It is common for your temperature to cycle up and down following surgery, especially at night when you are not up moving around and exerting yourself.  The breathing machine keeps your lungs expanded and your temperature down.  DIET You may resume your previous home diet once your are discharged from the hospital.  DRESSING / WOUND CARE / SHOWERING . You have an adhesive waterproof bandage over the incision. Leave this in place until your first follow-up appointment. Once you remove this you will not need to place another bandage.  . You may begin showering 3 days following surgery, but do not submerge the incision under water.  ACTIVITY . For the first 3-5 days, it is important to rest and keep the operative  leg elevated. You should, as a general rule, rest for 50 minutes and walk/stretch for 10 minutes per hour. After 5 days, you may slowly increase activity as tolerated.  . Perform the exercises you were provided twice a day for about 15-20 minutes each session. Begin these 2 days following surgery. . Walk with your walker as instructed. Use the walker until you are comfortable transitioning to a cane. Walk with the cane in the opposite hand of the operative leg. You may discontinue the cane once you are comfortable and walking steadily. . Avoid periods of inactivity such as sitting longer than an hour when not asleep. This helps prevent blood clots.  . Do not drive a car for 6 weeks or until released by your surgeon.  . Do not drive while taking narcotics.  TED HOSE STOCKINGS Wear the elastic stockings on both legs for three weeks following surgery during the day. You may remove them at night while sleeping.  WEIGHT BEARING Weight bearing as tolerated with assist device (walker, cane, etc) as directed, use it as long as suggested by your surgeon or therapist, typically at least 4-6 weeks.  POSTOPERATIVE CONSTIPATION PROTOCOL Constipation - defined medically as fewer than three stools per week and severe constipation as less than one stool per week.  One of the most common issues patients have following surgery is constipation.  Even if you have a regular bowel pattern at home, your normal regimen is likely to be disrupted due to multiple reasons following surgery.  Combination of anesthesia, postoperative narcotics,   change in appetite and fluid intake all can affect your bowels.  In order to avoid complications following surgery, here are some recommendations in order to help you during your recovery period.  . Colace (docusate) - Pick up an over-the-counter form of Colace or another stool softener and take twice a day as long as you are requiring postoperative pain medications.  Take with a full  glass of water daily.  If you experience loose stools or diarrhea, hold the colace until you stool forms back up.  If your symptoms do not get better within 1 week or if they get worse, check with your doctor. . Dulcolax (bisacodyl) - Pick up over-the-counter and take as directed by the product packaging as needed to assist with the movement of your bowels.  Take with a full glass of water.  Use this product as needed if not relieved by Colace only.  . MiraLax (polyethylene glycol) - Pick up over-the-counter to have on hand.  MiraLax is a solution that will increase the amount of water in your bowels to assist with bowel movements.  Take as directed and can mix with a glass of water, juice, soda, coffee, or tea.  Take if you go more than two days without a movement.Do not use MiraLax more than once per day. Call your doctor if you are still constipated or irregular after using this medication for 7 days in a row.  If you continue to have problems with postoperative constipation, please contact the office for further assistance and recommendations.  If you experience "the worst abdominal pain ever" or develop nausea or vomiting, please contact the office immediatly for further recommendations for treatment.  ITCHING  If you experience itching with your medications, try taking only a single pain pill, or even half a pain pill at a time.  You can also use Benadryl over the counter for itching or also to help with sleep.   MEDICATIONS See your medication summary on the "After Visit Summary" that the nursing staff will review with you prior to discharge.  You may have some home medications which will be placed on hold until you complete the course of blood thinner medication.  It is important for you to complete the blood thinner medication as prescribed by your surgeon.  Continue your approved medications as instructed at time of discharge.  PRECAUTIONS If you experience chest pain or shortness of breath -  call 911 immediately for transfer to the hospital emergency department.  If you develop a fever greater that 101 F, purulent drainage from wound, increased redness or drainage from wound, foul odor from the wound/dressing, or calf pain - CONTACT YOUR SURGEON.                                                   FOLLOW-UP APPOINTMENTS Make sure you keep all of your appointments after your operation with your surgeon and caregivers. You should call the office at the above phone number and make an appointment for approximately two weeks after the date of your surgery or on the date instructed by your surgeon outlined in the "After Visit Summary".  RANGE OF MOTION AND STRENGTHENING EXERCISES  These exercises are designed to help you keep full movement of your hip joint. Follow your caregiver's or physical therapist's instructions. Perform all exercises about fifteen times, three   times per day or as directed. Exercise both hips, even if you have had only one joint replacement. These exercises can be done on a training (exercise) mat, on the floor, on a table or on a bed. Use whatever works the best and is most comfortable for you. Use music or television while you are exercising so that the exercises are a pleasant break in your day. This will make your life better with the exercises acting as a break in routine you can look forward to.  . Lying on your back, slowly slide your foot toward your buttocks, raising your knee up off the floor. Then slowly slide your foot back down until your leg is straight again.  . Lying on your back spread your legs as far apart as you can without causing discomfort.  . Lying on your side, raise your upper leg and foot straight up from the floor as far as is comfortable. Slowly lower the leg and repeat.  . Lying on your back, tighten up the muscle in the front of your thigh (quadriceps muscles). You can do this by keeping your leg straight and trying to raise your heel off the  floor. This helps strengthen the largest muscle supporting your knee.  . Lying on your back, tighten up the muscles of your buttocks both with the legs straight and with the knee bent at a comfortable angle while keeping your heel on the floor.   IF YOU ARE TRANSFERRED TO A SKILLED REHAB FACILITY If the patient is transferred to a skilled rehab facility following release from the hospital, a list of the current medications will be sent to the facility for the patient to continue.  When discharged from the skilled rehab facility, please have the facility set up the patient's Home Health Physical Therapy prior to being released. Also, the skilled facility will be responsible for providing the patient with their medications at time of release from the facility to include their pain medication, the muscle relaxants, and their blood thinner medication. If the patient is still at the rehab facility at time of the two week follow up appointment, the skilled rehab facility will also need to assist the patient in arranging follow up appointment in our office and any transportation needs.  MAKE SURE YOU:  . Understand these instructions.  . Get help right away if you are not doing well or get worse.    DENTAL ANTIBIOTICS:  In most cases prophylactic antibiotics for Dental procdeures after total joint surgery are not necessary.  Exceptions are as follows:  1. History of prior total joint infection  2. Severely immunocompromised (Organ Transplant, cancer chemotherapy, Rheumatoid biologic meds such as Humera)  3. Poorly controlled diabetes (A1C &gt; 8.0, blood glucose over 200)  If you have one of these conditions, contact your surgeon for an antibiotic prescription, prior to your dental procedure.    Pick up stool softner and laxative for home use following surgery while on pain medications. Do not submerge incision under water. Please use good hand washing techniques while changing dressing each  day. May shower starting three days after surgery. Please use a clean towel to pat the incision dry following showers. Continue to use ice for pain and swelling after surgery. Do not use any lotions or creams on the incision until instructed by your surgeon.  

## 2020-01-23 ENCOUNTER — Encounter (HOSPITAL_COMMUNITY): Payer: Self-pay | Admitting: Orthopedic Surgery

## 2020-01-23 DIAGNOSIS — L03116 Cellulitis of left lower limb: Secondary | ICD-10-CM | POA: Diagnosis not present

## 2020-01-23 LAB — BASIC METABOLIC PANEL
Anion gap: 11 (ref 5–15)
BUN: 11 mg/dL (ref 8–23)
CO2: 25 mmol/L (ref 22–32)
Calcium: 8.5 mg/dL — ABNORMAL LOW (ref 8.9–10.3)
Chloride: 98 mmol/L (ref 98–111)
Creatinine, Ser: 0.73 mg/dL (ref 0.44–1.00)
GFR, Estimated: >60 mL/min (ref >60)
Glucose, Bld: 161 mg/dL — ABNORMAL HIGH (ref 70–99)
Potassium: 4 mmol/L (ref 3.5–5.1)
Sodium: 134 mmol/L — ABNORMAL LOW (ref 135–145)

## 2020-01-23 LAB — CBC
HCT: 35.4 % — ABNORMAL LOW (ref 36.0–46.0)
Hemoglobin: 11.8 g/dL — ABNORMAL LOW (ref 12.0–15.0)
MCH: 32.2 pg (ref 26.0–34.0)
MCHC: 33.3 g/dL (ref 30.0–36.0)
MCV: 96.7 fL (ref 80.0–100.0)
Platelets: 297 10*3/uL (ref 150–400)
RBC: 3.66 MIL/uL — ABNORMAL LOW (ref 3.87–5.11)
RDW: 12.3 % (ref 11.5–15.5)
WBC: 14.9 10*3/uL — ABNORMAL HIGH (ref 4.0–10.5)
nRBC: 0 % (ref 0.0–0.2)

## 2020-01-23 MED ORDER — METOPROLOL TARTRATE 5 MG/5ML IV SOLN: 5.0000 mg | Freq: Four times a day (QID) | INTRAVENOUS | Status: DC | PRN

## 2020-01-23 MED ORDER — CHLORHEXIDINE GLUCONATE 0.12 % MT SOLN
15.0000 mL | Freq: Once | OROMUCOSAL | Status: AC
  Administered 2020-01-23: 15 mL via OROMUCOSAL
  Filled 2020-01-23: qty 15

## 2020-01-23 MED ORDER — ORAL CARE MOUTH RINSE: 15.0000 mL | Freq: Once | OROMUCOSAL | Status: AC

## 2020-01-23 MED ORDER — LACTATED RINGERS IV SOLN: INTRAVENOUS | Status: DC

## 2020-01-23 MED ORDER — DILTIAZEM HCL ER COATED BEADS 240 MG PO CP24: 240.0000 mg | ORAL_CAPSULE | Freq: Every day | ORAL | Status: DC

## 2020-01-23 MED ORDER — DILTIAZEM HCL-DEXTROSE 125-5 MG/125ML-% IV SOLN (PREMIX): 5.0000 mg/h | INTRAVENOUS | Status: DC

## 2020-01-23 MED ORDER — DILTIAZEM HCL 25 MG/5ML IV SOLN
15.0000 mg | Freq: Once | INTRAVENOUS | Status: DC
  Filled 2020-01-23: qty 5

## 2020-01-23 MED ORDER — METOPROLOL SUCCINATE ER 100 MG PO TB24
100.0000 mg | ORAL_TABLET | Freq: Two times a day (BID) | ORAL | Status: DC
  Administered 2020-01-23 – 2020-01-27 (×8): 100 mg via ORAL
  Filled 2020-01-23 (×8): qty 1

## 2020-01-23 MED ORDER — DILTIAZEM HCL ER COATED BEADS 180 MG PO CP24
180.0000 mg | ORAL_CAPSULE | Freq: Every day | ORAL | Status: DC
  Administered 2020-01-24 – 2020-01-27 (×4): 180 mg via ORAL
  Filled 2020-01-23 (×4): qty 1

## 2020-01-23 NOTE — Progress Notes (Signed)
Physical Therapy Treatment Patient Details Name: Dana Bradford MRN: [id number] DOB: 09-16-1930 Today's Date: 01/23/2020    History of Present Illness Pt is an 86 y/o female admitted secondary to L hip pain with cellulitis.  PMH including but not limited to Afib (on Eliquis), venous stasis dermatitis, HLD, Hypothyroidism, hypertension, RLS, s/p bilateral knee replacement..  Pt no s/p L THR by anterior direct approach 01/22/20    PT Comments    Pt very motivated and with slowly improving activity tolerance and ambulatory stability but continues to require assist for all mobility tasks and presents as at high risk for falls.     Follow Up Recommendations  SNF     Equipment Recommendations  Rolling walker with 5" wheels    Recommendations for Other Services OT consult     Precautions / Restrictions Precautions Precautions: Fall Restrictions Weight Bearing Restrictions: No Other Position/Activity Restrictions: WBAT    Mobility  Bed Mobility Overal bed mobility: Needs Assistance Bed Mobility: Sit to Supine     Supine to sit: Min assist;Mod assist Sit to supine: Min assist;Mod assist;+2 for safety/equipment;+2 for physical assistance   General bed mobility comments: Min-Mod Assist for LEs and trunk control. Increased time.  Transfers Overall transfer level: Needs assistance Equipment used: Rolling walker (2 wheeled) Transfers: Sit to/from Stand Sit to Stand: Min assist;+2 safety/equipment;From elevated surface;+2 physical assistance         General transfer comment: Assist to power up, stabilize, control descent. Increased time.  Ambulation/Gait Ambulation/Gait assistance: Min assist;+2 safety/equipment Gait Distance (Feet): 22 Feet Assistive device: Rolling walker (2 wheeled) Gait Pattern/deviations: Step-to pattern;Decreased step length - right;Decreased stride length Gait velocity: decr   General Gait Details: Increased time with cues for sequence, posture and  position from RW;  Physical assist required for stability, support and RW management   Stairs             Wheelchair Mobility    Modified Rankin (Stroke Patients Only)       Balance Overall balance assessment: Needs assistance Sitting-balance support: Feet supported;No upper extremity supported Sitting balance-Leahy Scale: Fair     Standing balance support: Bilateral upper extremity supported Standing balance-Leahy Scale: Poor Standing balance comment: Required RW for support                            Cognition Arousal/Alertness: Awake/alert Behavior During Therapy: WFL for tasks assessed/performed Overall Cognitive Status: Within Functional Limits for tasks assessed                                        Exercises Total Joint Exercises Ankle Circles/Pumps: AROM;Both;15 reps;Supine Quad Sets: AROM;Both;10 reps;Supine Heel Slides: AAROM;Left;20 reps;Supine Hip ABduction/ADduction: AAROM;Left;15 reps;Supine    General Comments        Pertinent Vitals/Pain Pain Assessment: 0-10 Pain Score: 4  Pain Location: L hip/thigh Pain Descriptors / Indicators: Aching;Discomfort;Burning Pain Intervention(s): Limited activity within patient's tolerance;Monitored during session;Premedicated before session;Ice applied    Home Living Family/patient expects to be discharged to:: Unsure Living Arrangements: Alone Available Help at Discharge: Family;Available PRN/intermittently Type of Home: House Home Access: Stairs to enter Entrance Stairs-Rails: Right Home Layout: One level Home Equipment: Walker - 2 wheels;Cane - single point;Shower seat;Cane - quad;Hand held shower head Additional Comments: Adjustable bed without rails, several canes including hurry-cane,    Prior Function Level of Independence: Independent  with assistive device(s)      Comments: ambulates with a RW or cane; daughter drives; pt performs all her own BADLs and IADLs without  assistance.  Pt denies falls but endorses feeling unsteady at baseline.   PT Goals (current goals can now be found in the care plan section) Acute Rehab PT Goals Patient Stated Goal: Regain IND PT Goal Formulation: With patient Time For Goal Achievement: 02/03/20 Potential to Achieve Goals: Good Progress towards PT goals: Progressing toward goals    Frequency    Min 5X/week      PT Plan Current plan remains appropriate    Co-evaluation              AM-PAC PT "6 Clicks" Mobility   Outcome Measure  Help needed turning from your back to your side while in a flat bed without using bedrails?: A Lot Help needed moving from lying on your back to sitting on the side of a flat bed without using bedrails?: A Lot Help needed moving to and from a bed to a chair (including a wheelchair)?: A Lot Help needed standing up from a chair using your arms (e.g., wheelchair or bedside chair)?: A Lot Help needed to walk in hospital room?: A Little Help needed climbing 3-5 steps with a railing? : A Lot 6 Click Score: 13    End of Session Equipment Utilized During Treatment: Gait belt Activity Tolerance: Patient limited by fatigue Patient left: in bed;with call bell/phone within reach;with bed alarm set Nurse Communication: Mobility status PT Visit Diagnosis: Muscle weakness (generalized) (M62.81);Other abnormalities of gait and mobility (R26.89)     Time: 1749-4496 PT Time Calculation (min) (ACUTE ONLY): 26 min  Charges:  $Gait Training: 8-22 mins $Therapeutic Exercise: 8-22 mins $Therapeutic Activity: 8-22 mins                     Debe Coder PT Acute Rehabilitation Services Pager [phone] Office [phone]    Dana Bradford 01/23/2020, 2:57 PM

## 2020-01-23 NOTE — Progress Notes (Addendum)
[facility] Progress Note  Patient: Dana Bradford    CZY:[id number]  DOA: 01/18/2020     Date of Service: the patient was seen and examined on 01/23/2020  Brief hospital course: Past medical history of paroxysmal A. fib on Eliquis, HTN, hypothyroidism.  Presents with complaints of hip pain and cellulitis.  Started on IV antibiotics.  Patient has severe osteoarthritis of the hip and undergoing hip arthroplasty on 01/22/2020. Currently plan is monitor postop recovery.  Assessment and Plan: 1.  Left leg cellulitis Treated with IV ceftriaxone.  Now on oral antibiotics X-ray negative for any acute fracture. CT shows severe osteoarthritis. Continue antibiotics 2 days postop for total of 7 days.  2.  Hypertension Hypotension from hypovolemia Given IV fluid with improvement. Blood pressure stable. Monitor.  3.  Hypothyroidism Continue Synthroid  4.  End-stage left hip osteoarthritis Underwent procedure on 01/22/2020. PT OT currently recommends SNF. Initial plan was for transfer to orthopedics postoperatively but given RVR we will continue to monitor the patient has a primary.  5.  HLD Continue statin  6.  Paroxysmal A. fib with RVR Resume Eliquis postop once appropriate. Per orthopedics okay to increase the dose back to prior home dose. Lopressor and Cardizem dose adjusted due to RVR.  7.  Obesity Placing the patient at poor outcome secondary to her osteoarthritis. Dietary consultation postop recommended. Body mass index is 31.94 kg/m.   8.  Postop confusion. Monitor.  Diet: Cardiac diet DVT Prophylaxis:   apixaban (ELIQUIS) tablet 2.5 mg Start: 01/23/20 0800 SCDs Start: 01/22/20 1633 Place TED hose Start: 01/22/20 1633 apixaban (ELIQUIS) tablet 2.5 mg    Advance goals of care discussion: DNR  Family Communication: no family was present at bedside, at the time of interview.   Disposition:  Status is: Inpatient  Remains inpatient appropriate because:Ongoing  diagnostic testing needed not appropriate for outpatient work up   Dispo: The patient is from: Home              Anticipated d/c is to: SNF              Anticipated d/c date is: 2 days              Patient currently is not medically stable to d/c.  Subjective: Pain well controlled.  No nausea no vomiting.  Confused.  No fever no chills.  Heart rate elevated during the earlier part of the day later on well controlled.  Physical Exam:  General: Appear in mild distress, no Rash; Oral Mucosa Clear, moist. no Abnormal Neck Mass Or lumps, Conjunctiva normal  Cardiovascular: S1 and S2 Present, no Murmur, Respiratory: good respiratory effort, Bilateral Air entry present and CTA, no Crackles, no wheezes Abdomen: Bowel Sound present, Soft and no tenderness Extremities: left Pedal edema Neurology: alert and oriented to time, place, and person affect appropriate. no new focal deficit Gait not checked due to patient safety concerns  Vitals:   01/23/20 0953 01/23/20 1153 01/23/20 1452 01/23/20 1742  BP: 123/81 113/86 130/84 (!) 127/99  Pulse: (!) 130 (!) 103 (!) 118 82  Resp: 17 18 19 14   Temp: 98.3 F (36.8 C) 98.4 F (36.9 C) 98.4 F (36.9 C) 98 F (36.7 C)  TempSrc: Oral Oral Oral Oral  SpO2: 95% 93% 91% (!) 89%  Weight:      Height:        Intake/Output Summary (Last 24 hours) at 01/23/2020 1918 Last data filed at 01/23/2020 1300 Gross per 24 hour  Intake  611.36 ml  Output 1600 ml  Net -988.64 ml   Filed Weights   01/19/20 0315  Weight: 69.3 kg    Data Reviewed: I have personally reviewed and interpreted daily labs, tele strips, imagings as discussed above. I reviewed all nursing notes, pharmacy notes, vitals, pertinent old records I have discussed plan of care as described above with RN and patient/family.  CBC: Recent Labs  Lab 01/18/20 0215 01/18/20 1742 01/19/20 0621 01/20/20 0413 01/23/20 0436  WBC 10.6* 11.0* 9.0 9.6 14.9*  NEUTROABS 6.3 8.1*  --  5.7  --     HGB 13.1 12.5 10.6* 10.8* 11.8*  HCT 38.0 37.3 32.5* 33.2* 35.4*  MCV 94.5 96.1 97.3 97.4 96.7  PLT 298 297 282 286 888   Basic Metabolic Panel: Recent Labs  Lab 01/18/20 1742 01/19/20 0621 01/20/20 0413 01/21/20 0525 01/23/20 0436  NA 136 139 139 136 134*  K 3.5 3.5 3.8 3.7 4.0  CL 101 105 106 100 98  CO2 25 26 24 26 25   GLUCOSE 114* 141* 98 101* 161*  BUN 44* 30* 19 14 11   CREATININE 1.37* 1.05* 0.76 0.85 0.73  CALCIUM 9.1 8.5* 8.3* 8.5* 8.5*  MG  --   --  1.4* 2.1  --     Studies: No results found.  Scheduled Meds: . apixaban  2.5 mg Oral Q12H  . brimonidine  1 drop Both Eyes BID  . cephALEXin  500 mg Oral TID  . Chlorhexidine Gluconate Cloth  6 each Topical Daily  . [START ON 01/24/2020] diltiazem  180 mg Oral Daily  . docusate sodium  100 mg Oral BID  . famotidine  40 mg Oral QHS  . ferrous sulfate  325 mg Oral BID  . latanoprost  1 drop Both Eyes QHS  . levothyroxine  88 mcg Oral QAC breakfast  . metoprolol succinate  100 mg Oral BID  . mirtazapine  7.5 mg Oral QHS  . pantoprazole  80 mg Oral Daily  . pneumococcal 23 valent vaccine  0.5 mL Intramuscular Tomorrow-1000  . pramipexole  1 mg Oral QHS  . pregabalin  25 mg Oral Daily  . simvastatin  20 mg Oral QPM  . timolol  1 drop Both Eyes BID   Continuous Infusions:  PRN Meds: acetaminophen, bisacodyl, menthol-cetylpyridinium **OR** phenol, methocarbamol **OR** [DISCONTINUED] methocarbamol (ROBAXIN) IV, metoprolol tartrate, morphine injection, ondansetron **OR** ondansetron (ZOFRAN) IV, polyethylene glycol, polyvinyl alcohol, traMADol  Time spent: 35 minutes  Author: Berle Mull, MD [facility] 01/23/2020 7:18 PM  To reach On-call, see care teams to locate the attending and reach out via [email or web address]. Between 7PM-7AM, please contact night-coverage If you still have difficulty reaching the attending provider, please page the [facility] (Director on Call) for [facility] on amion for assistance.

## 2020-01-23 NOTE — Progress Notes (Signed)
Subjective: 1 Day Post-Op Procedure(s) (LRB): TOTAL HIP ARTHROPLASTY ANTERIOR APPROACH (Left) Patient reports pain as mild.   Patient seen in rounds with Dr. Wynelle Link. Patient is well, and has had no acute complaints or problems. She is slightly confused this AM, thought she was in her kitchen during rounds. She is very pleasant and is aware of her surgery yesterday. States her hip feels much better this morning. States she is looking forward to working with physical therapy today.  Objective: Vital signs in last 24 hours: Temp:  [98 F (36.7 C)-98.8 F (37.1 C)] 98.2 F (36.8 C) (12/02 0536) Pulse Rate:  [82-126] 122 (12/02 0536) Resp:  [12-20] 20 (12/02 0536) BP: (92-147)/(66-105) 130/100 (12/02 0536) SpO2:  [91 %-100 %] 96 % (12/02 0536)  Intake/Output from previous day:  Intake/Output Summary (Last 24 hours) at 01/23/2020 0800 Last data filed at 01/23/2020 0539 Gross per 24 hour  Intake 821.36 ml  Output 2800 ml  Net -1978.64 ml     Intake/Output this shift: No intake/output data recorded.  Labs: Recent Labs    01/23/20 0436  HGB 11.8*   Recent Labs    01/23/20 0436  WBC 14.9*  RBC 3.66*  HCT 35.4*  PLT 297   Recent Labs    01/21/20 0525 01/23/20 0436  NA 136 134*  K 3.7 4.0  CL 100 98  CO2 26 25  BUN 14 11  CREATININE 0.85 0.73  GLUCOSE 101* 161*  CALCIUM 8.5* 8.5*   No results for input(s): LABPT, INR in the last 72 hours.  Exam: General - Patient is Alert and Confused Extremity - Neurologically intact Neurovascular intact Sensation intact distally Dorsiflexion/Plantar flexion intact Dressing - dressing C/D/I Motor Function - intact, moving foot and toes well on exam.   Past Medical History:  Diagnosis Date  . Acute kidney failure ([place]) 2016 to 2017 due to vacomycin   no kidney issues now  . Anemia   . Anxiety   . Arthritis   . Atrial fibrillation ([place])   . Depression   . Diabetes mellitus without complication ([place])   . Dyspnea     . Food allergy    Fish ([place])   . Glaucoma   . Hypertension   . Impaired fasting glucose   . Other specified acquired hypothyroidism   . Pneumonia   . Restless leg   . Restless leg   . Skin cancer    Forehead  . 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 Venous stasis ulcer ([facility])     Assessment/Plan: 1 Day Post-Op Procedure(s) (LRB): TOTAL HIP ARTHROPLASTY ANTERIOR APPROACH (Left) Principal Problem:   Left leg cellulitis Active Problems:   Essential hypertension, benign   GERD (gastroesophageal reflux disease)   OA (osteoarthritis) of hip   Dehydration   Hypotension   Hypothyroidism   Hyperlipidemia   AF (paroxysmal atrial fibrillation) ([facility])  Estimated body mass index is 31.94 kg/m as calculated from the following:   Height as of this encounter: 4' 9.99" (1.473 m).   Weight as of this encounter: 69.3 kg. Up with therapy  DVT Prophylaxis - Eliquis Weight bearing as tolerated. Begin therapy.  Due to confusion and age, tylenol and tramadol only for postoperative pain.  Plan is to go Home after hospital stay. Due to atrial fibrillation, requesting that hospitalists continue to see Dana Bradford with [place]. Intraoperatively, CRNA almost started cardizem drip but was able to hold off.  Theresa Duty, PA-C Orthopedic Surgery [phone] 01/23/2020, 8:00 AM

## 2020-01-23 NOTE — Progress Notes (Signed)
Patient woke up confused about where she was at and tried to get out of bed without assistance. This confusion was temporary, we helped the patient get settled back in bed and explained to her that she is at [facility]. The confusion subsided and she was AxOx4. Call bell within reach, belongings within reach, all expressed needs meet.

## 2020-01-23 NOTE — Progress Notes (Signed)
Physical Therapy Re-Evaluation Patient Details Name: Dana Bradford MRN: [id number] DOB: Jan 25, 1931 Today's Date: 01/23/2020   History of Present Illness  Pt is an 86 y/o female admitted secondary to L hip pain with cellulitis.  PMH including but not limited to Afib (on Eliquis), venous stasis dermatitis, HLD, Hypothyroidism, hypertension, RLS, s/p bilateral knee replacement..  Pt no s/p L THR by anterior direct approach 01/22/20  Clinical Impression  Pt admitted as above and presenting with functional mobility limitations 2* generalized weakness, decreased L LE strength/ROM, ambulatory balance deficits, post op pain, and limited endurance.  Pt currently requires significant assist to perform all basic mobility tasks and would benefit from follow up rehab at SNF level to maximize IND and safety prior to return home with ltd assist.    Follow Up Recommendations SNF    Equipment Recommendations  Rolling walker with 5" wheels (Youth level RW)    Recommendations for Other Services OT consult     Precautions / Restrictions Precautions Precautions: Fall Restrictions Weight Bearing Restrictions: No Other Position/Activity Restrictions: WBAT      Mobility  Bed Mobility Overal bed mobility: Needs Assistance Bed Mobility: Supine to Sit     Supine to sit: Min assist;Mod assist     General bed mobility comments: Min-Mod Assist for LEs. Increased time.    Transfers Overall transfer level: Needs assistance Equipment used: Rolling walker (2 wheeled) Transfers: Sit to/from Stand Sit to Stand: Min assist;+2 safety/equipment;From elevated surface;+2 physical assistance         General transfer comment: Assist to power up, stabilize, control descent. Increased time.  Ambulation/Gait Ambulation/Gait assistance: Min assist;+2 safety/equipment Gait Distance (Feet): 16 Feet Assistive device: Rolling walker (2 wheeled) Gait Pattern/deviations: Step-to pattern;Decreased step length -  right;Decreased stride length Gait velocity: decr   General Gait Details: Increased time with cues for sequence, posture and position from RW;  Physical assist required for stability, support and RW management  Stairs            Wheelchair Mobility    Modified Rankin (Stroke Patients Only)       Balance Overall balance assessment: Needs assistance Sitting-balance support: Feet supported;No upper extremity supported Sitting balance-Leahy Scale: Fair     Standing balance support: Bilateral upper extremity supported Standing balance-Leahy Scale: Poor Standing balance comment: Required RW for support                             Pertinent Vitals/Pain Pain Assessment: 0-10 Pain Score: 4  Pain Location: L hip/thigh Pain Descriptors / Indicators: Aching;Discomfort;Burning Pain Intervention(s): Limited activity within patient's tolerance;Monitored during session;Premedicated before session;Ice applied    Home Living Family/patient expects to be discharged to:: Unsure Living Arrangements: Alone Available Help at Discharge: Family;Available PRN/intermittently Type of Home: House Home Access: Stairs to enter Entrance Stairs-Rails: Right Entrance Stairs-Number of Steps: 3 Home Layout: One level Home Equipment: Walker - 2 wheels;Cane - single point;Shower seat;Cane - quad;Hand held shower head Additional Comments: Adjustable bed without rails, several canes including hurry-cane,    Prior Function Level of Independence: Independent with assistive device(s)         Comments: ambulates with a RW or cane; daughter drives; pt performs all her own BADLs and IADLs without assistance.  Pt denies falls but endorses feeling unsteady at baseline.     Hand Dominance   Dominant Hand: Right    Extremity/Trunk Assessment   Upper Extremity Assessment Upper Extremity Assessment: Generalized weakness  Lower Extremity Assessment Lower Extremity Assessment:  Generalized weakness;LLE deficits/detail LLE Deficits / Details: 2/5 strength at hip with AAROM at hip to 15 abd and 65 flex    Cervical / Trunk Assessment Cervical / Trunk Assessment: Kyphotic  Communication   Communication: HOH  Cognition Arousal/Alertness: Awake/alert Behavior During Therapy: WFL for tasks assessed/performed Overall Cognitive Status: Within Functional Limits for tasks assessed                                        General Comments      Exercises Total Joint Exercises Ankle Circles/Pumps: AROM;Both;15 reps;Supine Quad Sets: AROM;Both;10 reps;Supine Heel Slides: AAROM;Left;20 reps;Supine Hip ABduction/ADduction: AAROM;Left;15 reps;Supine   Assessment/Plan    PT Assessment Patient needs continued PT services  PT Problem List Decreased strength;Decreased range of motion;Decreased activity tolerance;Decreased balance;Decreased knowledge of use of DME;Decreased mobility       PT Treatment Interventions DME instruction;Gait training;Therapeutic activities;Therapeutic exercise;Patient/family education;Functional mobility training;Balance training    PT Goals (Current goals can be found in the Care Plan section)  Acute Rehab PT Goals Patient Stated Goal: Regain IND PT Goal Formulation: With patient Time For Goal Achievement: 02/03/20 Potential to Achieve Goals: Good    Frequency Min 5X/week   Barriers to discharge Decreased caregiver support Pt does not have 24/7    Co-evaluation               AM-PAC PT "6 Clicks" Mobility  Outcome Measure Help needed turning from your back to your side while in a flat bed without using bedrails?: A Lot Help needed moving from lying on your back to sitting on the side of a flat bed without using bedrails?: A Lot Help needed moving to and from a bed to a chair (including a wheelchair)?: A Lot Help needed standing up from a chair using your arms (e.g., wheelchair or bedside chair)?: A Lot Help  needed to walk in hospital room?: A Lot Help needed climbing 3-5 steps with a railing? : A Lot 6 Click Score: 12    End of Session Equipment Utilized During Treatment: Gait belt Activity Tolerance: Patient limited by fatigue Patient left: in chair;with call bell/phone within reach;with chair alarm set Nurse Communication: Mobility status PT Visit Diagnosis: Muscle weakness (generalized) (M62.81);Other abnormalities of gait and mobility (R26.89)    Time: 1125-1207 PT Time Calculation (min) (ACUTE ONLY): 42 min   Charges:   PT Evaluation $PT Re-evaluation: 1 Re-eval PT Treatments $Gait Training: 8-22 mins $Therapeutic Exercise: 8-22 mins        Debe Coder PT Acute Rehabilitation Services Pager [phone] Office [phone]   Khoa Opdahl 01/23/2020, 2:21 PM

## 2020-01-24 DIAGNOSIS — L03116 Cellulitis of left lower limb: Secondary | ICD-10-CM | POA: Diagnosis not present

## 2020-01-24 LAB — BASIC METABOLIC PANEL
Anion gap: 10 (ref 5–15)
BUN: 18 mg/dL (ref 8–23)
CO2: 27 mmol/L (ref 22–32)
Calcium: 8.6 mg/dL — ABNORMAL LOW (ref 8.9–10.3)
Chloride: 99 mmol/L (ref 98–111)
Creatinine, Ser: 0.89 mg/dL (ref 0.44–1.00)
GFR, Estimated: >60 mL/min (ref >60)
Glucose, Bld: 155 mg/dL — ABNORMAL HIGH (ref 70–99)
Potassium: 4 mmol/L (ref 3.5–5.1)
Sodium: 136 mmol/L (ref 135–145)

## 2020-01-24 LAB — CBC
HCT: 32.4 % — ABNORMAL LOW (ref 36.0–46.0)
Hemoglobin: 10.9 g/dL — ABNORMAL LOW (ref 12.0–15.0)
MCH: 32.3 pg (ref 26.0–34.0)
MCHC: 33.6 g/dL (ref 30.0–36.0)
MCV: 96.1 fL (ref 80.0–100.0)
Platelets: 292 10*3/uL (ref 150–400)
RBC: 3.37 MIL/uL — ABNORMAL LOW (ref 3.87–5.11)
RDW: 12.7 % (ref 11.5–15.5)
WBC: 15.8 10*3/uL — ABNORMAL HIGH (ref 4.0–10.5)
nRBC: 0 % (ref 0.0–0.2)

## 2020-01-24 MED ORDER — METHOCARBAMOL 500 MG PO TABS
500.0000 mg | ORAL_TABLET | Freq: Four times a day (QID) | ORAL | 0 refills | Status: DC | PRN
Start: 2020-01-24 — End: 2020-05-12

## 2020-01-24 MED ORDER — ZOLPIDEM TARTRATE 5 MG PO TABS: 5.0000 mg | ORAL_TABLET | Freq: Every evening | ORAL | Status: DC | PRN

## 2020-01-24 MED ORDER — APIXABAN 5 MG PO TABS
5.0000 mg | ORAL_TABLET | Freq: Two times a day (BID) | ORAL | Status: DC
  Administered 2020-01-24 – 2020-01-27 (×6): 5 mg via ORAL
  Filled 2020-01-24 (×6): qty 1

## 2020-01-24 MED ORDER — TRAMADOL HCL 50 MG PO TABS
50.0000 mg | ORAL_TABLET | Freq: Four times a day (QID) | ORAL | 0 refills | Status: DC | PRN
Start: 2020-01-24 — End: 2020-05-12

## 2020-01-24 MED ORDER — ONDANSETRON HCL 4 MG PO TABS
4.0000 mg | ORAL_TABLET | Freq: Four times a day (QID) | ORAL | 0 refills | Status: DC | PRN
Start: 2020-01-24 — End: 2020-05-12

## 2020-01-24 NOTE — Progress Notes (Signed)
Pharmacy Consult: Eliquis for AFib On Eliquis 5 mg po bid PTA for Afib. On Eliquis 2.5 mg po BID post op ortho sx, got 2.5 mg this morning. Age > 80, wt > 60, SCr< 1.5 Plan: resume Eliquis 5 mg po bid tonight  Eudelia Bunch, Pharm.D 01/24/2020 10:38 AM

## 2020-01-24 NOTE — Progress Notes (Signed)
I spoke with the patients daughter Malachy Mood), she has some concerns about her mother's confusion and would like to speak with the MD. The daughter expressed that her mother has had a similar problem before when she did not get adequate sleep and when she was taking pain medication.

## 2020-01-24 NOTE — Progress Notes (Signed)
[facility] Progress Note  Patient: Dana Bradford    ELF:[id number]  DOA: 01/18/2020     Date of Service: the patient was seen and examined on 01/24/2020  Brief hospital course: Past medical history of paroxysmal A. fib on Eliquis, HTN, hypothyroidism.  Presents with complaints of hip pain and cellulitis.  Started on IV antibiotics.  Patient has severe osteoarthritis of the hip and undergoing hip arthroplasty on 01/22/2020. Currently plan is monitor postop recovery.  Assessment and Plan: 1.  Left leg cellulitis Treated with IV ceftriaxone.  Now on oral antibiotics X-ray negative for any acute fracture. CT shows severe osteoarthritis. Antibiotics completed.  No further evidence of cellulitis.  2.  Hypertension Hypotension from hypovolemia Given IV fluid with improvement. Blood pressure stable. Monitor.  3.  Hypothyroidism Continue Synthroid  4.  End-stage left hip osteoarthritis Underwent procedure on 01/22/2020. PT OT currently recommends SNF. Initial plan was for transfer to orthopedics postoperatively but given RVR we will continue to monitor the patient has a primary.  5.  HLD Continue statin  6.  Paroxysmal A. fib with RVR Resume Eliquis postop once appropriate. Per orthopedics okay to increase the dose back to prior home dose. Lopressor and Cardizem dose adjusted due to RVR.  7.  Obesity Placing the patient at poor outcome secondary to her osteoarthritis. Dietary consultation postop recommended. Body mass index is 31.94 kg/m.   8.  Postop confusion. Monitor.  Attempt Ambien.  9.  Uterine prolapse?Marland Kitchen Refer to GYN outpatient.  No symptoms for now.  Diet: Cardiac diet DVT Prophylaxis:   SCDs Start: 01/22/20 1633 Place TED hose Start: 01/22/20 1633 apixaban (ELIQUIS) tablet 5 mg    Advance goals of care discussion: DNR  Family Communication: family was present at bedside, at the time of interview.   Disposition:  Status is: Inpatient  Remains  inpatient appropriate because:Ongoing diagnostic testing needed not appropriate for outpatient work up   Dispo: The patient is from: Home              Anticipated d/c is to: SNF              Anticipated d/c date is: 2 days              Patient currently is not medically stable to d/c.  Subjective: Pain not well controlled.  No nausea no vomiting.  Unable to sleep at night secondary to pain.  Physical Exam:  General: Appear in mild distress, no Rash; Oral Mucosa Clear, moist. no Abnormal Neck Mass Or lumps, Conjunctiva normal  Cardiovascular: S1 and S2 Present, no Murmur, Respiratory: good respiratory effort, Bilateral Air entry present and CTA, no Crackles, no wheezes Abdomen: Bowel Sound present, Soft and no tenderness Extremities: left Pedal edema Neurology: alert and oriented to time, place, and person affect appropriate. no new focal deficit Gait not checked due to patient safety concerns  Vitals:   01/23/20 2111 01/23/20 2112 01/24/20 0521 01/24/20 1217  BP: (!) 130/99 (!) 130/99 (!) 129/100 132/82  Pulse: 89 89 77 73  Resp:  20 16 16   Temp:  98.2 F (36.8 C) 97.7 F (36.5 C) 98.2 F (36.8 C)  TempSrc:  Oral Oral Oral  SpO2:  94% 93% 92%  Weight:      Height:        Intake/Output Summary (Last 24 hours) at 01/24/2020 1940 Last data filed at 01/24/2020 1100 Gross per 24 hour  Intake 420 ml  Output 425 ml  Net -5 ml  Filed Weights   01/19/20 0315  Weight: 69.3 kg    Data Reviewed: I have personally reviewed and interpreted daily labs, tele strips, imagings as discussed above. I reviewed all nursing notes, pharmacy notes, vitals, pertinent old records I have discussed plan of care as described above with RN and patient/family.  CBC: Recent Labs  Lab 01/18/20 0215 01/18/20 0215 01/18/20 1742 01/19/20 0621 01/20/20 0413 01/23/20 0436 01/24/20 0421  WBC 10.6*   < > 11.0* 9.0 9.6 14.9* 15.8*  NEUTROABS 6.3  --  8.1*  --  5.7  --   --   HGB 13.1   < > 12.5  10.6* 10.8* 11.8* 10.9*  HCT 38.0   < > 37.3 32.5* 33.2* 35.4* 32.4*  MCV 94.5   < > 96.1 97.3 97.4 96.7 96.1  PLT 298   < > 297 282 286 297 292   < > = values in this interval not displayed.   Basic Metabolic Panel: Recent Labs  Lab 01/19/20 0621 01/20/20 0413 01/21/20 0525 01/23/20 0436 01/24/20 0421  NA 139 139 136 134* 136  K 3.5 3.8 3.7 4.0 4.0  CL 105 106 100 98 99  CO2 26 24 26 25 27   GLUCOSE 141* 98 101* 161* 155*  BUN 30* 19 14 11 18   CREATININE 1.05* 0.76 0.85 0.73 0.89  CALCIUM 8.5* 8.3* 8.5* 8.5* 8.6*  MG  --  1.4* 2.1  --   --     Studies: No results found.  Scheduled Meds: . apixaban  5 mg Oral BID  . brimonidine  1 drop Both Eyes BID  . cephALEXin  500 mg Oral TID  . Chlorhexidine Gluconate Cloth  6 each Topical Daily  . diltiazem  180 mg Oral Daily  . docusate sodium  100 mg Oral BID  . famotidine  40 mg Oral QHS  . ferrous sulfate  325 mg Oral BID  . latanoprost  1 drop Both Eyes QHS  . levothyroxine  88 mcg Oral QAC breakfast  . metoprolol succinate  100 mg Oral BID  . mirtazapine  7.5 mg Oral QHS  . pantoprazole  80 mg Oral Daily  . pneumococcal 23 valent vaccine  0.5 mL Intramuscular Tomorrow-1000  . pramipexole  1 mg Oral QHS  . pregabalin  25 mg Oral Daily  . simvastatin  20 mg Oral QPM  . timolol  1 drop Both Eyes BID   Continuous Infusions:  PRN Meds: acetaminophen, bisacodyl, menthol-cetylpyridinium **OR** phenol, methocarbamol **OR** [DISCONTINUED] methocarbamol (ROBAXIN) IV, metoprolol tartrate, ondansetron **OR** ondansetron (ZOFRAN) IV, polyethylene glycol, polyvinyl alcohol, traMADol  Time spent: 35 minutes  Author: Berle Mull, MD [facility] 01/24/2020 7:40 PM  To reach On-call, see care teams to locate the attending and reach out via [email or web address]. Between 7PM-7AM, please contact night-coverage If you still have difficulty reaching the attending provider, please page the [facility] (Director on Call) for [facility] on  amion for assistance.

## 2020-01-24 NOTE — Progress Notes (Signed)
Physical Therapy Treatment Patient Details Name: Dana Bradford MRN: [id number] DOB: 11/18/1930 Today's Date: 01/24/2020    History of Present Illness Pt is an 86 y/o female admitted secondary to L hip pain with cellulitis.  PMH including but not limited to Afib (on Eliquis), venous stasis dermatitis, HLD, Hypothyroidism, hypertension, RLS, s/p bilateral knee replacement..  Pt no s/p L THR by anterior direct approach 01/22/20    PT Comments    Pt continues very cooperative but progressing slowly with mobility and at high risk for falls.  Pt would benefit from follow up at SNF level rehab to maximize IND and safety prior to return home with limited assist.   Follow Up Recommendations  SNF     Equipment Recommendations  Rolling walker with 5" wheels    Recommendations for Other Services OT consult     Precautions / Restrictions Precautions Precautions: Fall Restrictions Weight Bearing Restrictions: No Other Position/Activity Restrictions: WBAT    Mobility  Bed Mobility Overal bed mobility: Needs Assistance Bed Mobility: Sit to Supine     Supine to sit: Min assist;Mod assist Sit to supine: Min assist;Mod assist   General bed mobility comments: increased time with cues for sequence and assist with bil LEs  Transfers Overall transfer level: Needs assistance Equipment used: Rolling walker (2 wheeled) Transfers: Sit to/from Stand Sit to Stand: Min assist;From elevated surface         General transfer comment: Assist to power up, stabilize, control descent. Increased time.  Ambulation/Gait Ambulation/Gait assistance: Min assist Gait Distance (Feet): 20 Feet Assistive device: Rolling walker (2 wheeled) Gait Pattern/deviations: Step-to pattern;Decreased step length - right;Decreased stride length Gait velocity: decr   General Gait Details: Increased time with cues for sequence, posture and position from RW;  Physical assist required for stability, support and RW  management   Stairs             Wheelchair Mobility    Modified Rankin (Stroke Patients Only)       Balance Overall balance assessment: Needs assistance Sitting-balance support: Feet supported;No upper extremity supported Sitting balance-Leahy Scale: Fair     Standing balance support: Bilateral upper extremity supported Standing balance-Leahy Scale: Poor Standing balance comment: Required RW for support                            Cognition Arousal/Alertness: Awake/alert Behavior During Therapy: WFL for tasks assessed/performed Overall Cognitive Status: Within Functional Limits for tasks assessed                                        Exercises Total Joint Exercises Ankle Circles/Pumps: AROM;Both;15 reps;Supine Quad Sets: AROM;Both;10 reps;Supine Heel Slides: AAROM;Left;20 reps;Supine Hip ABduction/ADduction: AAROM;Left;15 reps;Supine    General Comments        Pertinent Vitals/Pain Pain Assessment: 0-10 Pain Score: 5  Pain Location: L hip/thigh Pain Descriptors / Indicators: Aching;Discomfort;Burning Pain Intervention(s): Limited activity within patient's tolerance;Monitored during session;Premedicated before session    Home Living                      Prior Function            PT Goals (current goals can now be found in the care plan section) Acute Rehab PT Goals Patient Stated Goal: Regain IND PT Goal Formulation: With patient Time For Goal Achievement: 02/03/20 Potential  to Achieve Goals: Good Progress towards PT goals: Progressing toward goals    Frequency    Min 5X/week      PT Plan Current plan remains appropriate    Co-evaluation              AM-PAC PT "6 Clicks" Mobility   Outcome Measure  Help needed turning from your back to your side while in a flat bed without using bedrails?: A Lot Help needed moving from lying on your back to sitting on the side of a flat bed without using  bedrails?: A Lot Help needed moving to and from a bed to a chair (including a wheelchair)?: A Lot Help needed standing up from a chair using your arms (e.g., wheelchair or bedside chair)?: A Lot Help needed to walk in hospital room?: A Little Help needed climbing 3-5 steps with a railing? : A Lot 6 Click Score: 13    End of Session Equipment Utilized During Treatment: Gait belt Activity Tolerance: Patient limited by fatigue;Patient limited by pain;Patient tolerated treatment well Patient left: in chair;with call bell/phone within reach;with chair alarm set Nurse Communication: Mobility status PT Visit Diagnosis: Muscle weakness (generalized) (M62.81);Other abnormalities of gait and mobility (R26.89)     Time: 2876-8115 PT Time Calculation (min) (ACUTE ONLY): 25 min  Charges:  $Gait Training: 8-22 mins $Therapeutic Exercise: 8-22 mins $Therapeutic Activity: 8-22 mins                     Debe Coder PT Acute Rehabilitation Services Pager [phone] Office [phone]    Keisha Amer 01/24/2020, 2:52 PM

## 2020-01-24 NOTE — Plan of Care (Signed)
POC discussed with pt, pt alert and oriented x4, shows no signs of distress, VSS. Pain managed with prn pain medications (see MAR). Bed wheels locked, nonskid footwear applied OOB, call bell within reach, encouraged to use call bell for assistance, no falls during shift  Problem: Health Behavior/Discharge Planning: Goal: Ability to manage health-related needs will improve Outcome: Progressing   Problem: Clinical Measurements: Goal: Ability to maintain clinical measurements within normal limits will improve Outcome: Progressing Goal: Will remain free from infection Outcome: Progressing Goal: Diagnostic test results will improve Outcome: Progressing Goal: Respiratory complications will improve Outcome: Progressing Goal: Cardiovascular complication will be avoided Outcome: Progressing   Problem: Activity: Goal: Risk for activity intolerance will decrease Outcome: Progressing   Problem: Elimination: Goal: Will not experience complications related to bowel motility Outcome: Progressing Goal: Will not experience complications related to urinary retention Outcome: Progressing   Problem: Pain Managment: Goal: General experience of comfort will improve Outcome: Progressing   Problem: Safety: Goal: Ability to remain free from injury will improve Outcome: Progressing   Problem: Skin Integrity: Goal: Risk for impaired skin integrity will decrease Outcome: Progressing

## 2020-01-24 NOTE — Progress Notes (Signed)
Subjective: 2 Days Post-Op Procedure(s) (LRB): TOTAL HIP ARTHROPLASTY ANTERIOR APPROACH (Left) Patient reports pain as mild.   Patient seen in rounds for Dr. Wynelle Link. Patient is well, and has had no acute complaints or problems other than soreness in the left hip. Denies chest pain or SOB. No issues overnight. More oriented this AM. Plan is to go Skilled nursing facility after hospital stay.  Objective: Vital signs in last 24 hours: Temp:  [97.7 F (36.5 C)-98.4 F (36.9 C)] 97.7 F (36.5 C) (12/03 0521) Pulse Rate:  [77-130] 77 (12/03 0521) Resp:  [14-20] 16 (12/03 0521) BP: (113-130)/(81-100) 129/100 (12/03 0521) SpO2:  [89 %-95 %] 93 % (12/03 0521)  Intake/Output from previous day:  Intake/Output Summary (Last 24 hours) at 01/24/2020 0740 Last data filed at 01/24/2020 0528 Gross per 24 hour  Intake 420 ml  Output 425 ml  Net -5 ml    Intake/Output this shift: No intake/output data recorded.  Labs: Recent Labs    01/23/20 0436 01/24/20 0421  HGB 11.8* 10.9*   Recent Labs    01/23/20 0436 01/24/20 0421  WBC 14.9* 15.8*  RBC 3.66* 3.37*  HCT 35.4* 32.4*  PLT 297 292   Recent Labs    01/23/20 0436 01/24/20 0421  NA 134* 136  K 4.0 4.0  CL 98 99  CO2 25 27  BUN 11 18  CREATININE 0.73 0.89  GLUCOSE 161* 155*  CALCIUM 8.5* 8.6*   No results for input(s): LABPT, INR in the last 72 hours.  Exam: General - Patient is Alert and Oriented Extremity - Neurologically intact Neurovascular intact Sensation intact distally Dorsiflexion/Plantar flexion intact Dressing/Incision - clean, dry, no drainage Motor Function - intact, moving foot and toes well on exam.   Past Medical History:  Diagnosis Date  . Acute kidney failure ([place]) 2016 to 2017 due to vacomycin   no kidney issues now  . Anemia   . Anxiety   . Arthritis   . Atrial fibrillation ([place])   . Depression   . Diabetes mellitus without complication ([place])   . Dyspnea   . Food allergy    Fish  (Hives)   . Glaucoma   . Hypertension   . Impaired fasting glucose   . Other specified acquired hypothyroidism   . Pneumonia   . Restless leg   . Restless leg   . Skin cancer    Forehead  . Stroke ([place]) 05/2017   ocular stoke on plavix , right eye some vision loss  . Venous stasis ulcer ([facility])     Assessment/Plan: 2 Days Post-Op Procedure(s) (LRB): TOTAL HIP ARTHROPLASTY ANTERIOR APPROACH (Left) Principal Problem:   Left leg cellulitis Active Problems:   Essential hypertension, benign   GERD (gastroesophageal reflux disease)   OA (osteoarthritis) of hip   Dehydration   Hypotension   Hypothyroidism   Hyperlipidemia   AF (paroxysmal atrial fibrillation) ([facility])  Estimated body mass index is 31.94 kg/m as calculated from the following:   Height as of this encounter: 4' 9.99" (1.473 m).   Weight as of this encounter: 69.3 kg. Up with therapy  DVT Prophylaxis - Eliquis Weight-bearing as tolerated  Therapy currently recommending SNF. Ready for transfer from orthopedic standpoint, will have prescriptions in chart in case she is ready to go this weekend.  Follow-up in the office December 14th.  The PDMP database was reviewed today prior to any opioid medications being prescribed to this patient.  Theresa Duty, PA-C Orthopedic Surgery [phone] 01/24/2020, 7:40  AM

## 2020-01-24 NOTE — Progress Notes (Signed)
Physical Therapy Treatment Patient Details Name: Dana Bradford MRN: [id number] DOB: [birth year] Today's Date: 01/24/2020    History of Present Illness Pt is an 86 y/o female admitted secondary to L hip pain with cellulitis.  PMH including but not limited to Afib (on Eliquis), venous stasis dermatitis, HLD, Hypothyroidism, hypertension, RLS, s/p bilateral knee replacement..  Pt no s/p L THR by anterior direct approach 01/22/20    PT Comments    Pt continues motivated but limited this am by increased pain despite premed.     Follow Up Recommendations  SNF     Equipment Recommendations  Rolling walker with 5" wheels    Recommendations for Other Services OT consult     Precautions / Restrictions Precautions Precautions: Fall Restrictions Weight Bearing Restrictions: No Other Position/Activity Restrictions: WBAT    Mobility  Bed Mobility Overal bed mobility: Needs Assistance Bed Mobility: Supine to Sit     Supine to sit: Min assist;Mod assist     General bed mobility comments: cues for sequence and use of R LE to self assist  Transfers Overall transfer level: Needs assistance Equipment used: Rolling walker (2 wheeled) Transfers: Sit to/from Stand Sit to Stand: Min assist;+2 safety/equipment;From elevated surface;+2 physical assistance         General transfer comment: Assist to power up, stabilize, control descent. Increased time.  Ambulation/Gait Ambulation/Gait assistance: Min assist;+2 safety/equipment Gait Distance (Feet): 10 Feet Assistive device: Rolling walker (2 wheeled) Gait Pattern/deviations: Step-to pattern;Decreased step length - right;Decreased stride length Gait velocity: decr   General Gait Details: Increased time with cues for sequence, posture and position from RW;  Physical assist required for stability, support and RW management   Stairs             Wheelchair Mobility    Modified Rankin (Stroke Patients Only)       Balance  Overall balance assessment: Needs assistance Sitting-balance support: Feet supported;No upper extremity supported Sitting balance-Leahy Scale: Fair     Standing balance support: Bilateral upper extremity supported Standing balance-Leahy Scale: Poor Standing balance comment: Required RW for support                            Cognition Arousal/Alertness: Awake/alert Behavior During Therapy: WFL for tasks assessed/performed Overall Cognitive Status: Within Functional Limits for tasks assessed                                        Exercises Total Joint Exercises Ankle Circles/Pumps: AROM;Both;15 reps;Supine Quad Sets: AROM;Both;10 reps;Supine Heel Slides: AAROM;Left;20 reps;Supine Hip ABduction/ADduction: AAROM;Left;15 reps;Supine    General Comments        Pertinent Vitals/Pain Pain Assessment: 0-10 Pain Score: 6  Pain Location: L hip/thigh Pain Descriptors / Indicators: Aching;Discomfort;Burning Pain Intervention(s): Limited activity within patient's tolerance;Monitored during session;Premedicated before session;Ice applied;Patient requesting pain meds-RN notified    Home Living                      Prior Function            PT Goals (current goals can now be found in the care plan section) Acute Rehab PT Goals Patient Stated Goal: Regain IND PT Goal Formulation: With patient Time For Goal Achievement: 02/03/20 Potential to Achieve Goals: Good Progress towards PT goals: Progressing toward goals    Frequency    Min  5X/week      PT Plan Current plan remains appropriate    Co-evaluation              AM-PAC PT "6 Clicks" Mobility   Outcome Measure  Help needed turning from your back to your side while in a flat bed without using bedrails?: A Lot Help needed moving from lying on your back to sitting on the side of a flat bed without using bedrails?: A Lot Help needed moving to and from a bed to a chair (including a  wheelchair)?: A Lot Help needed standing up from a chair using your arms (e.g., wheelchair or bedside chair)?: A Lot Help needed to walk in hospital room?: A Little Help needed climbing 3-5 steps with a railing? : A Lot 6 Click Score: 13    End of Session Equipment Utilized During Treatment: Gait belt Activity Tolerance: Patient limited by fatigue;Patient limited by pain Patient left: in chair;with call bell/phone within reach;with chair alarm set Nurse Communication: Mobility status PT Visit Diagnosis: Muscle weakness (generalized) (M62.81);Other abnormalities of gait and mobility (R26.89)     Time: 0940-1006 PT Time Calculation (min) (ACUTE ONLY): 26 min  Charges:  $Gait Training: 8-22 mins $Therapeutic Exercise: 8-22 mins                     [place] Pager [phone] Office [phone]    Yolanda Dockendorf 01/24/2020, 2:48 PM

## 2020-01-25 DIAGNOSIS — L03116 Cellulitis of left lower limb: Secondary | ICD-10-CM | POA: Diagnosis not present

## 2020-01-25 LAB — CBC
HCT: 31.1 % — ABNORMAL LOW (ref 36.0–46.0)
Hemoglobin: 10.4 g/dL — ABNORMAL LOW (ref 12.0–15.0)
MCH: 32.1 pg (ref 26.0–34.0)
MCHC: 33.4 g/dL (ref 30.0–36.0)
MCV: 96 fL (ref 80.0–100.0)
Platelets: 289 10*3/uL (ref 150–400)
RBC: 3.24 MIL/uL — ABNORMAL LOW (ref 3.87–5.11)
RDW: 12.9 % (ref 11.5–15.5)
WBC: 14.6 10*3/uL — ABNORMAL HIGH (ref 4.0–10.5)
nRBC: 0 % (ref 0.0–0.2)

## 2020-01-25 LAB — BASIC METABOLIC PANEL
Anion gap: 12 (ref 5–15)
BUN: 26 mg/dL — ABNORMAL HIGH (ref 8–23)
CO2: 26 mmol/L (ref 22–32)
Calcium: 8.7 mg/dL — ABNORMAL LOW (ref 8.9–10.3)
Chloride: 97 mmol/L — ABNORMAL LOW (ref 98–111)
Creatinine, Ser: 0.73 mg/dL (ref 0.44–1.00)
GFR, Estimated: >60 mL/min (ref >60)
Glucose, Bld: 122 mg/dL — ABNORMAL HIGH (ref 70–99)
Potassium: 3.7 mmol/L (ref 3.5–5.1)
Sodium: 135 mmol/L (ref 135–145)

## 2020-01-25 LAB — MAGNESIUM: Magnesium: 1.7 mg/dL (ref 1.7–2.4)

## 2020-01-25 MED ORDER — ZOLPIDEM TARTRATE 5 MG PO TABS
5.0000 mg | ORAL_TABLET | Freq: Every day | ORAL | Status: DC
  Administered 2020-01-25 – 2020-01-26 (×2): 5 mg via ORAL
  Filled 2020-01-25 (×2): qty 1

## 2020-01-25 NOTE — Progress Notes (Signed)
Subjective: 3 Days Post-Op Procedure(s) (LRB): TOTAL HIP ARTHROPLASTY ANTERIOR APPROACH (Left)  Patient reports pain as mild to moderate.  Tolerating POs well. Admits to flatus.  Denies fever, chills, N/V, CP, SOB.  Reports that she has worked well with therapy.  Expresses desire to go home.  Objective:   VITALS:  Temp:  [98 F (36.7 C)-98.3 F (36.8 C)] 98 F (36.7 C) (12/04 0448) Pulse Rate:  [62-84] 84 (12/04 0448) Resp:  [16-20] 20 (12/04 0448) BP: (121-140)/(73-82) 140/73 (12/04 0448) SpO2:  [92 %-96 %] 95 % (12/04 0448)  General: WDWN patient in NAD. Psych:  Appropriate mood and affect. Neuro:  A&O x 3, Moving all extremities, sensation intact to light touch HEENT:  EOMs intact Chest:  Even non-labored respirations Skin: Dressing C/D/I, no rashes or lesions Extremities: warm/dry,mild edema to L hip, no erythema or echymosis.  No lymphadenopathy. Pulses: Popliteus 2+ MSK:  ROM: TKE, MMT: able to perform quad set, (-) Homan's    LABS Recent Labs    01/23/20 0436 01/23/20 0436 01/24/20 0421 01/25/20 0600  HGB 11.8*  --  10.9* 10.4*  WBC 14.9*   < > 15.8* 14.6*  PLT 297   < > 292 289   < > = values in this interval not displayed.   Recent Labs    01/24/20 0421 01/25/20 0600  NA 136 135  K 4.0 3.7  CL 99 97*  CO2 27 26  BUN 18 26*  CREATININE 0.89 0.73  GLUCOSE 155* 122*   No results for input(s): LABPT, INR in the last 72 hours.   Assessment/Plan: 3 Days Post-Op Procedure(s) (LRB): TOTAL HIP ARTHROPLASTY ANTERIOR APPROACH (Left)  Patient seen in rounds for Dr. Mellissa Kohut L LE Up with therapy DVT ppx: Eliquis Disp: PT recommends SNF Patient ready for D/C from Ortho standpoint D/C scripts on chart Plan for outpatient post-op visit with Dr. Wynelle Link on 02/04/20.  Mechele Claude PA-C [facility] Office:  [phone]

## 2020-01-25 NOTE — TOC Progression Note (Signed)
Transition of Care [facility]) - Progression Note    Patient Details  Name: Dana Bradford MRN: [id number] Date of Birth: 01/08/1931  [facility] CM/SW Contact  Ross Ludwig, [place] Phone Number: 01/25/2020, 5:21 PM  Clinical Narrative:     CSW sent updated therapy notes to SNFs awaiting bed offers.  Expected Discharge Plan: [place] Barriers to Discharge: No Barriers Identified  Expected Discharge Plan and Services Expected Discharge Plan: [place] arrangements for the past 2 months: Single Family Home Expected Discharge Date: 01/24/20                                     Social Determinants of Health (SDOH) Interventions    Readmission Risk Interventions No flowsheet data found.

## 2020-01-25 NOTE — Progress Notes (Signed)
[facility] Progress Note  Patient: Dana Bradford    SWF:[id number]  DOA: 01/18/2020     Date of Service: the patient was seen and examined on 01/25/2020  Brief hospital course: Past medical history of paroxysmal A. fib on Eliquis, HTN, hypothyroidism. Presents with complaints of hip pain and cellulitis.  Started on IV antibiotics. Patient has severe osteoarthritis of the hip and undergoing hip arthroplasty on 01/22/2020. Currently plan is monitor postop recovery.  Assessment and Plan: 1.  Left leg cellulitis Treated with IV ceftriaxone.  Now on oral antibiotics. X-ray negative for any acute fracture. CT shows severe osteoarthritis. Antibiotics completed.   No further evidence of cellulitis.  2.  Hypertension Hypotension from hypovolemia Given IV fluid with improvement. Blood pressure stable. Monitor.  3.  Hypothyroidism Continue Synthroid  4.  End-stage left hip osteoarthritis Underwent procedure on 01/22/2020. PT OT currently recommends SNF. Initial plan was for transfer to orthopedics postoperatively but given RVR we will continue to monitor the patient has a primary.  5.  HLD Continue statin  6.  Paroxysmal A. fib with RVR Resume Eliquis postop once appropriate. Per orthopedics okay to increase the dose back to prior home dose. Lopressor and Cardizem dose adjusted due to RVR.  7.  Obesity Placing the patient at poor outcome secondary to her osteoarthritis. Dietary consultation postop recommended. Body mass index is 31.94 kg/m.   8.  Postop confusion Sleep deprivation Monitor.  Attempt Ambien scheduled.  9.  Uterine prolapse? Refer to GYN outpatient.  No symptoms for now.  Diet: Cardiac diet DVT Prophylaxis:   SCDs Start: 01/22/20 1633 Place TED hose Start: 01/22/20 1633 apixaban (ELIQUIS) tablet 5 mg    Advance goals of care discussion: DNR  Family Communication: family was present at bedside, at the time of interview.   Disposition:  Status is:  Inpatient  Remains inpatient appropriate because:Ongoing diagnostic testing needed not appropriate for outpatient work up   Dispo: The patient is from: Home              Anticipated d/c is to: SNF              Anticipated d/c date is: 2 days              Patient currently is not medically stable to d/c.  Subjective: no nausea, no fever. Remains confused and talks about being at hotel.   Physical Exam:  General: Appear in mild distress, no Rash; Oral Mucosa Clear, moist. no Abnormal Neck Mass Or lumps, Conjunctiva normal  Cardiovascular: S1 and S2 Present, no Murmur, Respiratory: good respiratory effort, Bilateral Air entry present and CTA, no Crackles, no wheezes Abdomen: Bowel Sound present, Soft and no tenderness Extremities: left Pedal edema Neurology: alert and oriented to time, place, and person affect appropriate. no new focal deficit Gait not checked due to patient safety concerns  Vitals:   01/24/20 2135 01/25/20 0448 01/25/20 0800 01/25/20 1409  BP: 121/80 140/73  132/84  Pulse: 62 84  (!) 52  Resp: 20 20  16   Temp: 98.3 F (36.8 C) 98 F (36.7 C)  98.4 F (36.9 C)  TempSrc: Oral Oral  Oral  SpO2: 96% 95% 94% 93%  Weight:      Height:        Intake/Output Summary (Last 24 hours) at 01/25/2020 1954 Last data filed at 01/25/2020 1220 Gross per 24 hour  Intake 520 ml  Output 550 ml  Net -30 ml   [employer]  01/19/20 0315  Weight: 69.3 kg    Data Reviewed: I have personally reviewed and interpreted daily labs, tele strips, imagings as discussed above. I reviewed all nursing notes, pharmacy notes, vitals, pertinent old records I have discussed plan of care as described above with RN and patient/family.  CBC: Recent Labs  Lab 01/19/20 0621 01/20/20 0413 01/23/20 0436 01/24/20 0421 01/25/20 0600  WBC 9.0 9.6 14.9* 15.8* 14.6*  NEUTROABS  --  5.7  --   --   --   HGB 10.6* 10.8* 11.8* 10.9* 10.4*  HCT 32.5* 33.2* 35.4* 32.4* 31.1*  MCV 97.3 97.4  96.7 96.1 96.0  PLT 282 286 297 292 264   Basic Metabolic Panel: Recent Labs  Lab 01/20/20 0413 01/21/20 0525 01/23/20 0436 01/24/20 0421 01/25/20 0600  NA 139 136 134* 136 135  K 3.8 3.7 4.0 4.0 3.7  CL 106 100 98 99 97*  CO2 24 26 25 27 26   GLUCOSE 98 101* 161* 155* 122*  BUN 19 14 11 18  26*  CREATININE 0.76 0.85 0.73 0.89 0.73  CALCIUM 8.3* 8.5* 8.5* 8.6* 8.7*  MG 1.4* 2.1  --   --  1.7    Studies: No results found.  Scheduled Meds: . apixaban  5 mg Oral BID  . brimonidine  1 drop Both Eyes BID  . cephALEXin  500 mg Oral TID  . diltiazem  180 mg Oral Daily  . docusate sodium  100 mg Oral BID  . famotidine  40 mg Oral QHS  . ferrous sulfate  325 mg Oral BID  . latanoprost  1 drop Both Eyes QHS  . levothyroxine  88 mcg Oral QAC breakfast  . metoprolol succinate  100 mg Oral BID  . mirtazapine  7.5 mg Oral QHS  . pantoprazole  80 mg Oral Daily  . pneumococcal 23 valent vaccine  0.5 mL Intramuscular Tomorrow-1000  . pramipexole  1 mg Oral QHS  . pregabalin  25 mg Oral Daily  . simvastatin  20 mg Oral QPM  . timolol  1 drop Both Eyes BID  . zolpidem  5 mg Oral QHS   Continuous Infusions:  PRN Meds: acetaminophen, bisacodyl, menthol-cetylpyridinium **OR** phenol, methocarbamol **OR** [DISCONTINUED] methocarbamol (ROBAXIN) IV, metoprolol tartrate, ondansetron **OR** ondansetron (ZOFRAN) IV, polyethylene glycol, polyvinyl alcohol, traMADol  Time spent: 35 minutes  Author: Berle Mull, MD [facility] 01/25/2020 7:54 PM  To reach On-call, see care teams to locate the attending and reach out via [email or web address]. Between 7PM-7AM, please contact night-coverage If you still have difficulty reaching the attending provider, please page the [facility] (Director on Call) for [facility] on amion for assistance.

## 2020-01-25 NOTE — Progress Notes (Signed)
Patient voided 250 ml in BSC. Bladder scan completed after voiding and per scan 0 ml in bladder post void.

## 2020-01-25 NOTE — Plan of Care (Signed)
  Problem: Health Behavior/Discharge Planning: Goal: Ability to manage health-related needs will improve Outcome: Progressing   Problem: Clinical Measurements: Goal: Respiratory complications will improve Outcome: Progressing   Problem: Activity: Goal: Risk for activity intolerance will decrease Outcome: Progressing   Problem: Nutrition: Goal: Adequate nutrition will be maintained Outcome: Progressing   Problem: Elimination: Goal: Will not experience complications related to urinary retention Outcome: Progressing   Problem: Pain Managment: Goal: General experience of comfort will improve Outcome: Progressing   Problem: Safety: Goal: Ability to remain free from injury will improve Outcome: Progressing   Problem: Skin Integrity: Goal: Risk for impaired skin integrity will decrease Outcome: Progressing

## 2020-01-25 NOTE — Progress Notes (Signed)
Patient stated no urge to void @ 2200. Bladder scan completed noted minimal urine in bladder per scan. Will reassess.

## 2020-01-25 NOTE — Progress Notes (Signed)
Physical Therapy Treatment Patient Details Name: Dana Bradford MRN: [id number] DOB: Jul 05, 1930 Today's Date: 01/25/2020    History of Present Illness Pt is an 86 y/o female admitted secondary to L hip pain with cellulitis.  PMH including but not limited to Afib (on Eliquis), venous stasis dermatitis, HLD, Hypothyroidism, hypertension, RLS, s/p bilateral knee replacement..  Pt no s/p L THR by anterior direct approach 01/22/20    PT Comments    Pt in good spirits and with noted improvement in activity tolerance and increased ease of movement but with increased level of confusion - RN states same reported from night nurse.   Follow Up Recommendations  SNF     Equipment Recommendations  Rolling walker with 5" wheels    Recommendations for Other Services OT consult     Precautions / Restrictions Precautions Precautions: Fall Restrictions Weight Bearing Restrictions: No Other Position/Activity Restrictions: WBAT    Mobility  Bed Mobility               General bed mobility comments: Up in chair and requests back to same  Transfers Overall transfer level: Needs assistance Equipment used: Rolling walker (2 wheeled) Transfers: Sit to/from Stand Sit to Stand: Min assist;From elevated surface Stand pivot transfers: Min assist       General transfer comment: Assist to power up, stabilize, control descent. Increased time.  Ambulation/Gait Ambulation/Gait assistance: Min assist Gait Distance (Feet): 36 Feet Assistive device: Rolling walker (2 wheeled) Gait Pattern/deviations: Step-to pattern;Decreased step length - right;Decreased stride length Gait velocity: decr   General Gait Details: Increased time with cues for sequence, posture and position from RW;  Physical assist required for stability, support and RW management   Stairs             Wheelchair Mobility    Modified Rankin (Stroke Patients Only)       Balance Overall balance assessment: Needs  assistance Sitting-balance support: Feet supported;No upper extremity supported Sitting balance-Leahy Scale: Fair     Standing balance support: Bilateral upper extremity supported Standing balance-Leahy Scale: Poor Standing balance comment: Required RW for support                            Cognition Arousal/Alertness: Awake/alert Behavior During Therapy: WFL for tasks assessed/performed Overall Cognitive Status: Impaired/Different from baseline                                 General Comments: Pt with increased confusion this am - thinks her son is her brother and talking in a circle about when she got up and what she did this morning      Exercises Total Joint Exercises Ankle Circles/Pumps: AROM;Both;15 reps;Supine Quad Sets: AROM;Both;10 reps;Supine Heel Slides: AAROM;Left;20 reps;Supine Hip ABduction/ADduction: AAROM;Left;15 reps;Supine    General Comments        Pertinent Vitals/Pain Pain Assessment: 0-10 Pain Score: 5  Pain Location: L hip/thigh Pain Descriptors / Indicators: Aching;Discomfort;Burning Pain Intervention(s): Limited activity within patient's tolerance;Monitored during session;Premedicated before session;Ice applied    Home Living                      Prior Function            PT Goals (current goals can now be found in the care plan section) Acute Rehab PT Goals Patient Stated Goal: Regain IND PT Goal Formulation: With patient Time  For Goal Achievement: 02/03/20 Potential to Achieve Goals: Good Progress towards PT goals: Progressing toward goals    Frequency    7X/week      PT Plan Current plan remains appropriate    Co-evaluation              AM-PAC PT "6 Clicks" Mobility   Outcome Measure  Help needed turning from your back to your side while in a flat bed without using bedrails?: A Lot Help needed moving from lying on your back to sitting on the side of a flat bed without using  bedrails?: A Lot Help needed moving to and from a bed to a chair (including a wheelchair)?: A Lot Help needed standing up from a chair using your arms (e.g., wheelchair or bedside chair)?: A Little Help needed to walk in hospital room?: A Little Help needed climbing 3-5 steps with a railing? : A Lot 6 Click Score: 14    End of Session Equipment Utilized During Treatment: Gait belt Activity Tolerance: Patient tolerated treatment well Patient left: in chair;with call bell/phone within reach;with chair alarm set;with family/visitor present Nurse Communication: Mobility status PT Visit Diagnosis: Muscle weakness (generalized) (M62.81);Other abnormalities of gait and mobility (R26.89)     Time: 0051-1021 PT Time Calculation (min) (ACUTE ONLY): 27 min  Charges:  $Gait Training: 8-22 mins $Therapeutic Exercise: 8-22 mins                     [place] Pager [phone] Office [phone]    Mackayla Mullins 01/25/2020, 12:44 PM

## 2020-01-26 DIAGNOSIS — L03116 Cellulitis of left lower limb: Secondary | ICD-10-CM | POA: Diagnosis not present

## 2020-01-26 LAB — BASIC METABOLIC PANEL
Anion gap: 11 (ref 5–15)
BUN: 20 mg/dL (ref 8–23)
CO2: 26 mmol/L (ref 22–32)
Calcium: 8.1 mg/dL — ABNORMAL LOW (ref 8.9–10.3)
Chloride: 97 mmol/L — ABNORMAL LOW (ref 98–111)
Creatinine, Ser: 0.67 mg/dL (ref 0.44–1.00)
GFR, Estimated: >60 mL/min (ref >60)
Glucose, Bld: 114 mg/dL — ABNORMAL HIGH (ref 70–99)
Potassium: 3.4 mmol/L — ABNORMAL LOW (ref 3.5–5.1)
Sodium: 134 mmol/L — ABNORMAL LOW (ref 135–145)

## 2020-01-26 MED ORDER — POTASSIUM CHLORIDE CRYS ER 20 MEQ PO TBCR
40.0000 meq | EXTENDED_RELEASE_TABLET | Freq: Once | ORAL | Status: AC
  Administered 2020-01-26: 40 meq via ORAL
  Filled 2020-01-26: qty 2

## 2020-01-26 NOTE — Progress Notes (Signed)
Physical Therapy Treatment Patient Details Name: Dana Bradford MRN: [id number] DOB: [birth year] Today's Date: 01/26/2020    History of Present Illness Pt is an 86 y/o female admitted secondary to L hip pain with cellulitis.  PMH including but not limited to Afib (on Eliquis), venous stasis dermatitis, HLD, Hypothyroidism, hypertension, RLS, s/p bilateral knee replacement..  Pt no s/p L THR by anterior direct approach 01/22/20    PT Comments    Pt continues to require Min-Mod assist for mobility. Mobility was limited this session 2* pain. Continue to recommend SNF.     Follow Up Recommendations  SNF     Equipment Recommendations  Rolling walker with 5" wheels (youth height)    Recommendations for Other Services       Precautions / Restrictions Precautions Precautions: Fall Restrictions Weight Bearing Restrictions: No Other Position/Activity Restrictions: WBAT    Mobility  Bed Mobility               General bed mobility comments: Up in chair and requests back to same  Transfers Overall transfer level: Needs assistance Equipment used: Rolling walker (2 wheeled) Transfers: Sit to/from Stand Sit to Stand: Mod assist         General transfer comment: Assist to power up, stabilize, control descent. Increased time.  Ambulation/Gait Ambulation/Gait assistance: Min assist Gait Distance (Feet): 3 Feet Assistive device: Rolling walker (2 wheeled) Gait Pattern/deviations: Step-to pattern;Decreased step length - right;Decreased stride length;Trunk flexed     General Gait Details: Increased time with cues for sequence, posture and position from RW;  Physical assist required for stability, support and RW management. Pt only able to tolerate taking a few steps forward. She declined to continue to attempting ambulation 2* pain. She took a few steps backwards to the recliner before sitting.   Stairs             Wheelchair Mobility    Modified Rankin (Stroke  Patients Only)       Balance Overall balance assessment: Needs assistance         Standing balance support: Bilateral upper extremity supported Standing balance-Leahy Scale: Poor                              Cognition Arousal/Alertness: Awake/alert Behavior During Therapy: WFL for tasks assessed/performed Overall Cognitive Status: Impaired/Different from baseline                                 General Comments: Still with some confusion      Exercises      General Comments        Pertinent Vitals/Pain Pain Assessment: Faces Faces Pain Scale: Hurts even more Pain Location: L hip/thigh, groin, L ankle Pain Descriptors / Indicators: Aching;Discomfort;Burning Pain Intervention(s): Limited activity within patient's tolerance;Monitored during session;Repositioned    Home Living                      Prior Function            PT Goals (current goals can now be found in the care plan section) Progress towards PT goals: Progressing toward goals    Frequency    7X/week      PT Plan Current plan remains appropriate    Co-evaluation              AM-PAC PT "6 Clicks" Mobility  Outcome Measure  Help needed turning from your back to your side while in a flat bed without using bedrails?: A Little Help needed moving from lying on your back to sitting on the side of a flat bed without using bedrails?: A Lot Help needed moving to and from a bed to a chair (including a wheelchair)?: A Lot Help needed standing up from a chair using your arms (e.g., wheelchair or bedside chair)?: A Lot Help needed to walk in hospital room?: A Lot Help needed climbing 3-5 steps with a railing? : Total 6 Click Score: 12    End of Session Equipment Utilized During Treatment: Gait belt Activity Tolerance: Patient limited by fatigue;Patient limited by pain Patient left: in chair;with call bell/phone within reach;with chair alarm set   PT Visit  Diagnosis: Muscle weakness (generalized) (M62.81);Other abnormalities of gait and mobility (R26.89)     Time: 7543-6067 PT Time Calculation (min) (ACUTE ONLY): 20 min  Charges:  $Gait Training: 8-22 mins                        Doreatha Massed, PT Acute Rehabilitation  Office: [phone] Pager: [phone]

## 2020-01-26 NOTE — Progress Notes (Signed)
   Subjective: 4 Days Post-Op Procedure(s) (LRB): TOTAL HIP ARTHROPLASTY ANTERIOR APPROACH (Left) Patient reports pain as mild.   Patient seen in rounds for Dr. Wynelle Link. Patient is well, and has had no acute complaints or problems other than discomfort in the left hip. Patient resting in bed on exam this morning. No acute events overnight. Denies CP, SHOB, N/V.  We will continue therapy today.   Objective: Vital signs in last 24 hours: Temp:  [97.7 F (36.5 C)-98.6 F (37 C)] 97.7 F (36.5 C) (12/05 0622) Pulse Rate:  [52-103] 84 (12/05 0622) Resp:  [16-20] 20 (12/05 0622) BP: (115-137)/(69-84) 115/69 (12/05 0622) SpO2:  [90 %-93 %] 93 % (12/05 0622)  Intake/Output from previous day:  Intake/Output Summary (Last 24 hours) at 01/26/2020 0939 Last data filed at 01/25/2020 2010 Gross per 24 hour  Intake 200 ml  Output 100 ml  Net 100 ml     Intake/Output this shift: No intake/output data recorded.  Labs: Recent Labs    01/24/20 0421 01/25/20 0600  HGB 10.9* 10.4*   Recent Labs    01/24/20 0421 01/25/20 0600  WBC 15.8* 14.6*  RBC 3.37* 3.24*  HCT 32.4* 31.1*  PLT 292 289   Recent Labs    01/25/20 0600 01/26/20 0434  NA 135 134*  K 3.7 3.4*  CL 97* 97*  CO2 26 26  BUN 26* 20  CREATININE 0.73 0.67  GLUCOSE 122* 114*  CALCIUM 8.7* 8.1*   No results for input(s): LABPT, INR in the last 72 hours.  Exam: General - Patient is Alert and Oriented Extremity - Neurologically intact Sensation intact distally Intact pulses distally Dorsiflexion/Plantar flexion intact Dressing - dressing C/D/I Motor Function - intact, moving foot and toes well on exam.   Past Medical History:  Diagnosis Date  . Acute kidney failure ([place]) 2016 to 2017 due to vacomycin   no kidney issues now  . Anemia   . Anxiety   . Arthritis   . Atrial fibrillation ([place])   . Depression   . Diabetes mellitus without complication ([place])   . Dyspnea   . Food allergy    Fish ([place])   .  Glaucoma   . Hypertension   . Impaired fasting glucose   . Other specified acquired hypothyroidism   . Pneumonia   . Restless leg   . Restless leg   . Skin cancer    Forehead  . 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 Venous stasis ulcer ([facility])     Assessment/Plan: 4 Days Post-Op Procedure(s) (LRB): TOTAL HIP ARTHROPLASTY ANTERIOR APPROACH (Left) Principal Problem:   Left leg cellulitis Active Problems:   Essential hypertension, benign   GERD (gastroesophageal reflux disease)   OA (osteoarthritis) of hip   Dehydration   Hypotension   Hypothyroidism   Hyperlipidemia   AF (paroxysmal atrial fibrillation) ([facility])  Estimated body mass index is 31.94 kg/m as calculated from the following:   Height as of this encounter: 4' 9.99" (1.473 m).   Weight as of this encounter: 69.3 kg. Advance diet Up with therapy  DVT Prophylaxis - Eliquis Weight bearing as tolerated.  Plan is to go Skilled nursing facility after hospital stay. Continue working with PT. Discharge when medically stable and bed placement obtained. D/C scripts on chart. Plan for outpatient post-op visit with Dr. Wynelle Link on 02/04/20.   Griffith Citron, PA-C Orthopedic Surgery [phone] 01/26/2020, 9:39 AM

## 2020-01-26 NOTE — Progress Notes (Signed)
[facility] Progress Note  Patient: Dana Bradford    NLG:[id number]  DOA: 01/18/2020     Date of Service: the patient was seen and examined on 01/26/2020  Brief hospital course: Past medical history of paroxysmal A. fib on Eliquis, HTN, hypothyroidism. Presents with complaints of hip pain and cellulitis.  Started on IV antibiotics. Patient has severe osteoarthritis of the hip and undergoing hip arthroplasty on 01/22/2020. Currently plan is await for SNF arrangements.  Social worker working.  Medically stable.  Assessment and Plan: 1.  Left leg cellulitis Treated with IV ceftriaxone.  Now on oral antibiotics. X-ray negative for any acute fracture. CT shows severe osteoarthritis. Antibiotics completed.   No further evidence of cellulitis.  2.  Hypertension Hypotension from hypovolemia Given IV fluid with improvement. Blood pressure stable. Monitor.  3.  Hypothyroidism Continue Synthroid  4.  End-stage left hip osteoarthritis Underwent procedure on 01/22/2020. PT OT currently recommends SNF. Initial plan was for transfer to orthopedics postoperatively but given RVR we will continue to monitor the patient has a primary.  5.  HLD Continue statin  6.  Paroxysmal A. fib with RVR Resume Eliquis postop once appropriate. Per orthopedics okay to increase the dose back to prior home dose. Lopressor and Cardizem dose adjusted due to RVR.  7.  Obesity Placing the patient at poor outcome secondary to her osteoarthritis. Dietary consultation postop recommended. Body mass index is 31.94 kg/m.   8.  Postop confusion Sleep deprivation Appears to be having some confabulation. Suspect that that she has undiagnosed dementia. Monitor. Continue Ambien scheduled.  9.  Uterine prolapse? Refer to GYN outpatient.  No symptoms for now.  Diet: Cardiac diet DVT Prophylaxis:   SCDs Start: 01/22/20 1633 Place TED hose Start: 01/22/20 1633 apixaban (ELIQUIS) tablet 5 mg    Advance  goals of care discussion: DNR  Family Communication: no family was present at bedside, at the time of interview.   Disposition:  Status is: Inpatient  Remains inpatient appropriate because:Ongoing diagnostic testing needed not appropriate for outpatient work up   Dispo: The patient is from: Home              Anticipated d/c is to: SNF              Anticipated d/c date is: 2 days              Patient currently is not medically stable to d/c.  Subjective: No acute complaint.  No nausea no vomiting.  No fever no chills.  Pain well controlled.  Physical Exam:  General: Appear in mild distress, no Rash; Oral Mucosa Clear, moist. no Abnormal Neck Mass Or lumps, Conjunctiva normal  Cardiovascular: S1 and S2 Present, no Murmur, Respiratory: good respiratory effort, Bilateral Air entry present and CTA, no Crackles, no wheezes Abdomen: Bowel Sound present, Soft and no tenderness Extremities: left Pedal edema Neurology: alert and oriented to time, place, and person affect appropriate. no new focal deficit Gait not checked due to patient safety concerns  Vitals:   01/25/20 2159 01/26/20 0000 01/26/20 0622 01/26/20 1311  BP: 137/78  115/69 103/65  Pulse: (!) 103  84 87  Resp:  18 20   Temp: 98.6 F (37 C)  97.7 F (36.5 C) 98.7 F (37.1 C)  TempSrc: Oral  Oral Oral  SpO2: 90% 90% 93% 92%  Weight:      Height:        Intake/Output Summary (Last 24 hours) at 01/26/2020 0814 Last data filed  at 01/26/2020 1312 Gross per 24 hour  Intake 240 ml  Output 100 ml  Net 140 ml   Filed Weights   01/19/20 0315  Weight: 69.3 kg    Data Reviewed: I have personally reviewed and interpreted daily labs, tele strips, imagings as discussed above. I reviewed all nursing notes, pharmacy notes, vitals, pertinent old records I have discussed plan of care as described above with RN and patient/family.  CBC: Recent Labs  Lab 01/20/20 0413 01/23/20 0436 01/24/20 0421 01/25/20 0600  WBC 9.6  14.9* 15.8* 14.6*  NEUTROABS 5.7  --   --   --   HGB 10.8* 11.8* 10.9* 10.4*  HCT 33.2* 35.4* 32.4* 31.1*  MCV 97.4 96.7 96.1 96.0  PLT 286 297 292 546   Basic Metabolic Panel: Recent Labs  Lab 01/20/20 0413 01/20/20 0413 01/21/20 0525 01/23/20 0436 01/24/20 0421 01/25/20 0600 01/26/20 0434  NA 139   < > 136 134* 136 135 134*  K 3.8   < > 3.7 4.0 4.0 3.7 3.4*  CL 106   < > 100 98 99 97* 97*  CO2 24   < > 26 25 27 26 26   GLUCOSE 98   < > 101* 161* 155* 122* 114*  BUN 19   < > 14 11 18  26* 20  CREATININE 0.76   < > 0.85 0.73 0.89 0.73 0.67  CALCIUM 8.3*   < > 8.5* 8.5* 8.6* 8.7* 8.1*  MG 1.4*  --  2.1  --   --  1.7  --    < > = values in this interval not displayed.    Studies: No results found.  Scheduled Meds: . apixaban  5 mg Oral BID  . brimonidine  1 drop Both Eyes BID  . diltiazem  180 mg Oral Daily  . docusate sodium  100 mg Oral BID  . famotidine  40 mg Oral QHS  . ferrous sulfate  325 mg Oral BID  . latanoprost  1 drop Both Eyes QHS  . levothyroxine  88 mcg Oral QAC breakfast  . metoprolol succinate  100 mg Oral BID  . mirtazapine  7.5 mg Oral QHS  . pantoprazole  80 mg Oral Daily  . pneumococcal 23 valent vaccine  0.5 mL Intramuscular Tomorrow-1000  . pramipexole  1 mg Oral QHS  . pregabalin  25 mg Oral Daily  . simvastatin  20 mg Oral QPM  . timolol  1 drop Both Eyes BID  . zolpidem  5 mg Oral QHS   Continuous Infusions:  PRN Meds: acetaminophen, bisacodyl, menthol-cetylpyridinium **OR** phenol, metoprolol tartrate, ondansetron **OR** ondansetron (ZOFRAN) IV, polyethylene glycol, polyvinyl alcohol, traMADol  Time spent: 35 minutes  Author: Berle Mull, MD [facility] 01/26/2020 7:15 PM  To reach On-call, see care teams to locate the attending and reach out via [email or web address]. Between 7PM-7AM, please contact night-coverage If you still have difficulty reaching the attending provider, please page the [facility] (Director on Call) for [facility] on amion for assistance.

## 2020-01-27 DIAGNOSIS — L03116 Cellulitis of left lower limb: Secondary | ICD-10-CM | POA: Diagnosis not present

## 2020-01-27 LAB — BASIC METABOLIC PANEL
Anion gap: 10 (ref 5–15)
BUN: 21 mg/dL (ref 8–23)
CO2: 27 mmol/L (ref 22–32)
Calcium: 8.1 mg/dL — ABNORMAL LOW (ref 8.9–10.3)
Chloride: 99 mmol/L (ref 98–111)
Creatinine, Ser: 0.89 mg/dL (ref 0.44–1.00)
GFR, Estimated: >60 mL/min (ref >60)
Glucose, Bld: 109 mg/dL — ABNORMAL HIGH (ref 70–99)
Potassium: 3.5 mmol/L (ref 3.5–5.1)
Sodium: 136 mmol/L (ref 135–145)

## 2020-01-27 LAB — GLUCOSE, CAPILLARY
Glucose-Capillary: 108 mg/dL — ABNORMAL HIGH (ref 70–99)
Glucose-Capillary: 148 mg/dL — ABNORMAL HIGH (ref 70–99)

## 2020-01-27 LAB — SARS CORONAVIRUS 2 BY RT PCR (HOSPITAL ORDER, PERFORMED IN ~~LOC~~ HOSPITAL LAB): SARS Coronavirus 2: NEGATIVE

## 2020-01-27 MED ORDER — POLYETHYLENE GLYCOL 3350 17 G PO PACK: 17.0000 g | PACK | Freq: Every day | ORAL | Status: DC

## 2020-01-27 MED ORDER — MIRTAZAPINE 15 MG PO TBDP
15.0000 mg | ORAL_TABLET | Freq: Every day | ORAL | 0 refills | Status: DC
Start: 2020-01-27 — End: 2020-05-12

## 2020-01-27 MED ORDER — PREGABALIN 25 MG PO CAPS
25.0000 mg | ORAL_CAPSULE | Freq: Every day | ORAL | 0 refills | Status: DC
Start: 2020-01-28 — End: 2020-05-12

## 2020-01-27 MED ORDER — POLYETHYLENE GLYCOL 3350 17 G PO PACK
17.0000 g | PACK | Freq: Every day | ORAL | 0 refills | Status: DC
Start: 2020-01-27 — End: 2020-05-12

## 2020-01-27 MED ORDER — METOPROLOL SUCCINATE ER 100 MG PO TB24
100.0000 mg | ORAL_TABLET | Freq: Two times a day (BID) | ORAL | 0 refills | Status: DC
Start: 2020-01-27 — End: 2020-04-27

## 2020-01-27 MED ORDER — DOCUSATE SODIUM 100 MG PO CAPS
100.0000 mg | ORAL_CAPSULE | Freq: Two times a day (BID) | ORAL | 0 refills | Status: DC
Start: 2020-01-27 — End: 2020-05-12

## 2020-01-27 MED ORDER — FUROSEMIDE 20 MG PO TABS
20.0000 mg | ORAL_TABLET | Freq: Every day | ORAL | 0 refills | Status: DC | PRN
Start: 2020-01-27 — End: 2020-05-12

## 2020-01-27 NOTE — Discharge Summary (Addendum)
[facility] Discharge Summary   Patient: Dana Bradford ZOX:[id number]  PCP: Jonathon Resides, MD  Date of admission: 01/18/2020   Date of discharge:  01/27/2020     Discharge Diagnoses:  Principal Problem:   Left leg cellulitis Active Problems:   Essential hypertension, benign   GERD (gastroesophageal reflux disease)   OA (osteoarthritis) of hip   Dehydration   Hypotension   Hypothyroidism   Hyperlipidemia   AF (paroxysmal atrial fibrillation) ([place])   Admitted From: home Disposition:  SNF   Recommendations for Outpatient Follow-up:  1. PCP: please follow up with PCP and Orthopedics as recommended 2. Follow up LABS/TEST:  none   Contact information for follow-up providers    Gaynelle Arabian, MD. Schedule an appointment as soon as possible for a visit on 02/04/2020.   Specialty: Orthopedic Surgery Contact information: [address] [place] [address] [phone]        Jonathon Resides, MD. Schedule an appointment as soon as possible for a visit in 1 week(s).   Specialty: Family Medicine Contact information: [place] [address] [phone]            Contact information for after-discharge care    [facility] .   Service: Skilled Nursing Contact information: [address] [place] [phone]                 Diet recommendation: Cardiac diet  Activity: The patient is advised to gradually reintroduce usual activities, as tolerated  Discharge Condition: stable  Code Status: DNR   History of present illness: As per the H and P dictated on admission, "Dana Bradford is a 86 y.o. female with medical history significant for afib on eliquis 5 mg BID 75 mg BID, hypertension, hypothyroid, presented to the emergency department for chief concerns of left hip pain.  Patient has chronic back pain and it worsened after presentation to ED at  [facility]. She was discharged home with treatments for cellulitis.  While being carried from her wheelchair to the car, per daughter states that she was deposited heavily and it resulted in worsening of her left hip.  Patient denies fever, chest pain, shortness of breath, abdominal pain.  Allergies: amlodipine causes angioedema "  Hospital Course:  Summary of her active problems in the hospital is as following. 1.  Left leg cellulitis Treated with IV ceftriaxone.  Now on oral antibiotics. X-ray negative for any acute fracture. CT shows severe osteoarthritis. Antibiotics completed.   No further evidence of cellulitis.  2.  Hypertension Hypotension from hypovolemia Given IV fluid with improvement. Blood pressure stable. Monitor.  3.  Hypothyroidism Continue Synthroid  4.  End-stage left hip osteoarthritis Underwent procedure on 01/22/2020. PT OT currently recommends SNF. Initial plan was for transfer to orthopedics postoperatively but given RVR we will continue to monitor the patient has a primary.  5.  HLD Continue statin  6.  Paroxysmal A. fib with RVR Resume Eliquis postop once appropriate. Per orthopedics okay to increase the dose back to prior home dose. Lopressor and Cardizem dose adjusted due to RVR.  7.  Obesity Placing the patient at poor outcome secondary to her osteoarthritis. Dietary consultation postop recommended. Body mass index is 31.94 kg/m.   8.  Postop confusion Sleep deprivation Appears to be having some confabulation. Suspect that that she has undiagnosed dementia. Monitor. Continue remeron dose increased, instead of scheduled [place]  9.  Uterine prolapse? Refer to GYN outpatient.  No symptoms for now.  10. Pain control  - [place] Controlled Substance Reporting System database was reviewed. - 5 day supply was provided. - Patient was instructed, not to drive, operate heavy machinery, perform activities at heights,  swimming or participation in water activities or provide baby sitting services while on Pain, Sleep and Anxiety Medications; until her outpatient Physician has advised to do so again.  - Also recommended to not to take more than prescribed Pain, Sleep and Anxiety Medications.  Patient was seen by physical therapy, who recommended SNF,  was arranged. On the day of the discharge the patient's vitals were stable, and no other acute medical condition were reported by patient. The patient was felt safe to be discharge at SNF with therapy.  Consultants: Orthopedics  Procedures: Left total hip arthroplasty, anterior approach. 01/22/2020  Discharge Exam: General: Appear in no distress, no Rash; Oral Mucosa Clear, moist. no Abnormal Neck Mass Or lumps, Conjunctiva normal  Cardiovascular: S1 and S2 Present, no Murmur Respiratory: good respiratory effort, Bilateral Air entry present and CTA, no Crackles, no wheezes Abdomen: Bowel Sound present, Soft and no tenderness Extremities: no Pedal edema Neurology: alert and oriented to time, place, and person affect appropriate. no new focal deficit  Filed Weights   01/19/20 0315  Weight: 69.3 kg   Vitals:   01/26/20 2216 01/27/20 0525  BP: 114/80 (!) 144/83  Pulse: 76 69  Resp: 14 18  Temp: 98.3 F (36.8 C) 98.4 F (36.9 C)  SpO2: 92% 92%    DISCHARGE MEDICATION: Allergies as of 01/27/2020      Reactions   Arthrotec [diclofenac-misoprostol] Shortness Of Breath, Diarrhea   Doxycycline Nausea And Vomiting, Rash   Significant enough to d/c treatment and refuse to take more Vomiting (intolerance) The first time she took it she had N/V and the second she had a rash.    Latex Rash, Other (See Comments)   Amlodipine    angioedema   Celebrex [celecoxib] Other (See Comments)   GI Bleeding   Vancomycin    Acute kidney failure   Diclofenac Diarrhea   Shellfish Allergy Itching, Rash      Medication List    STOP taking these medications    cephALEXin 500 MG capsule Commonly known as: KEFLEX   irbesartan-hydrochlorothiazide 300-12.5 MG tablet Commonly known as: AVALIDE   potassium chloride 10 MEQ tablet Commonly known as: KLOR-CON     TAKE these medications   acetaminophen 500 MG tablet Commonly known as: TYLENOL Take 2,000 mg by mouth daily as needed for mild pain or headache.   bismuth subsalicylate 846 KZ/99JT suspension Commonly known as: PEPTO BISMOL Take 30 mLs by mouth every 6 (six) hours as needed for indigestion or diarrhea or loose stools.   brimonidine 0.2 % ophthalmic solution Commonly known as: ALPHAGAN Place 1 drop into both eyes 2 (two) times daily.   diltiazem 180 MG 24 hr capsule Commonly known as: TIAZAC Take 180 mg by mouth daily.   docusate sodium 100 MG capsule Commonly known as: COLACE Take 1 capsule (100 mg total) by mouth 2 (two) times daily.   Eliquis 5 MG Tabs tablet Generic drug: apixaban Take 5 mg by mouth 2 (two) times daily.   famotidine 40 MG tablet Commonly known as: PEPCID Take 40 mg by mouth at bedtime.   ferrous sulfate 325 (65 FE) MG tablet Take 325 mg by mouth 2 (two) times daily.   furosemide 20  MG tablet Commonly known as: LASIX Take 1 tablet (20 mg total) by mouth daily as needed for fluid or edema (weight gain >3lbs in 1 day or >5 lbs in 2 days.). What changed:   how much to take  when to take this  reasons to take this   latanoprost 0.005 % ophthalmic solution Commonly known as: XALATAN Place 1 drop into both eyes at bedtime.   levothyroxine 88 MCG tablet Commonly known as: SYNTHROID Take 88 mcg by mouth daily before breakfast.   metFORMIN 500 MG tablet Commonly known as: GLUCOPHAGE Take 500 mg by mouth daily.   methocarbamol 500 MG tablet Commonly known as: ROBAXIN Take 1 tablet (500 mg total) by mouth every 6 (six) hours as needed for muscle spasms.   metoprolol succinate 100 MG 24 hr tablet Commonly known as: TOPROL-XL Take 1 tablet (100  mg total) by mouth 2 (two) times daily. Take with or immediately following a meal. What changed:   medication strength  how much to take  additional instructions   mirtazapine 15 MG disintegrating tablet Commonly known as: REMERON SOL-TAB Take 1 tablet (15 mg total) by mouth at bedtime.   omeprazole 40 MG capsule Commonly known as: PRILOSEC Take 40 mg by mouth daily.   ondansetron 4 MG tablet Commonly known as: ZOFRAN Take 1 tablet (4 mg total) by mouth every 6 (six) hours as needed for nausea.   polyethylene glycol 17 g packet Commonly known as: MIRALAX / GLYCOLAX Take 17 g by mouth daily.   pramipexole 1 MG tablet Commonly known as: MIRAPEX Take 1 mg by mouth at bedtime.   pregabalin 25 MG capsule Commonly known as: LYRICA Take 1 capsule (25 mg total) by mouth daily. Start taking on: January 28, 2020   simvastatin 20 MG tablet Commonly known as: ZOCOR Take 20 mg by mouth every evening.   Systane 0.4-0.3 % Gel ophthalmic gel Generic drug: Polyethyl Glycol-Propyl Glycol Place 1 application into both eyes 4 (four) times daily as needed (Dry eyes).   timolol 0.25 % ophthalmic solution Commonly known as: BETIMOL Place 1 drop into both eyes 2 (two) times daily.   traMADol 50 MG tablet Commonly known as: ULTRAM Take 1-2 tablets (50-100 mg total) by mouth every 6 (six) hours as needed for moderate pain.   triamcinolone ointment 0.1 % Commonly known as: KENALOG Apply 1 application topically 2 (two) times daily as needed (skin irritation.).   vitamin C 500 MG tablet Commonly known as: ASCORBIC ACID Take 500 mg by mouth 2 (two) times daily.   Vitamin D3 50 MCG (2000 UT) Tabs Take 2,000 Units by mouth daily.            Discharge Care Instructions  (From admission, onward)         Start     Ordered   01/24/20 0000  Weight bearing as tolerated        01/24/20 0749   01/24/20 0000  Change dressing       Comments: You have an adhesive waterproof bandage  over the incision. Leave this in place until your first follow-up appointment. Once you remove this you will not need to place another bandage.   01/24/20 0749         Allergies  Allergen Reactions  . Arthrotec [Diclofenac-Misoprostol] Shortness Of Breath and Diarrhea  . Doxycycline Nausea And Vomiting and Rash    Significant enough to d/c treatment and refuse to take more Vomiting (intolerance) The first time she  took it she had N/V and the second she had a rash.    . Latex Rash and Other (See Comments)  . Amlodipine     angioedema  . Celebrex [Celecoxib] Other (See Comments)    GI Bleeding  . Vancomycin     Acute kidney failure  . Diclofenac Diarrhea  . Shellfish Allergy Itching and Rash   Discharge Instructions    Call MD / Call 911   Complete by: As directed    If you experience chest pain or shortness of breath, CALL 911 and be transported to the hospital emergency room.  If you develope a fever above 101 F, pus (white drainage) or increased drainage or redness at the wound, or calf pain, call your surgeon's office.   Change dressing   Complete by: As directed    You have an adhesive waterproof bandage over the incision. Leave this in place until your first follow-up appointment. Once you remove this you will not need to place another bandage.   Constipation Prevention   Complete by: As directed    Drink plenty of fluids.  Prune juice may be helpful.  You may use a stool softener, such as Colace (over the counter) 100 mg twice a day.  Use MiraLax (over the counter) for constipation as needed.   Diet - low sodium heart healthy   Complete by: As directed    Diet - low sodium heart healthy   Complete by: As directed    Do not sit on low chairs, stoools or toilet seats, as it may be difficult to get up from low surfaces   Complete by: As directed    Driving restrictions   Complete by: As directed    No driving for two weeks   Increase activity slowly   Complete by: As  directed    No wound care   Complete by: As directed    TED hose   Complete by: As directed    Use stockings (TED hose) for three weeks on both leg(s).  You may remove them at night for sleeping.   Weight bearing as tolerated   Complete by: As directed       The results of significant diagnostics from this hospitalization (including imaging, microbiology, ancillary and laboratory) are listed below for reference.    Significant Diagnostic Studies: DG Lumbar Spine Complete  Result Date: 01/18/2020 CLINICAL DATA:  Acute low back pain EXAM: LUMBAR SPINE - COMPLETE 4+ VIEW COMPARISON:  08/24/2019 FINDINGS: There is no evidence of lumbar spine fracture. Vertebral body heights appear maintained. Unchanged alignment including grade 1 anterolisthesis of L4 on L5 and trace retrolisthesis of L1 on L2. Similar facet predominant degenerative changes of the lower lumbar spine partially visualized right hip arthroplasty. Severe, end-stage osteoarthritis of the left hip. IMPRESSION: 1. No evidence of acute fracture or traumatic listhesis of the lumbar spine. 2. Severe, end-stage osteoarthritis of the left hip. Electronically Signed   By: Davina Poke D.O.   On: 01/18/2020 18:37   DG Pelvis Portable  Result Date: 01/22/2020 CLINICAL DATA:  Status post left total hip arthroplasty EXAM: PORTABLE PELVIS 1-2 VIEWS COMPARISON:  01/18/2020 left hip radiograph FINDINGS: Status post interval left total hip arthroplasty with well-positioned left acetabular and proximal left femoral prostheses on this single frontal view. Previous right total hip arthroplasty. No evidence of hip dislocation on this single frontal view. No osseous fracture. No focal osseous lesions. Expected soft tissue gas surrounding the left hip joint. IMPRESSION: Satisfactory  single frontal view appearance status post left total hip arthroplasty. Electronically Signed   By: Ilona Sorrel M.D.   On: 01/22/2020 18:53   CT Hip Left Wo Contrast   Result Date: 01/18/2020 CLINICAL DATA:  Acute on chronic left hip pain, concern for occult fracture. EXAM: CT OF THE LEFT HIP WITHOUT CONTRAST TECHNIQUE: Multidetector CT imaging of the left hip was performed according to the standard protocol. Multiplanar CT image reconstructions were also generated. COMPARISON:  Same day hip radiograph. FINDINGS: Bones/Joint/Cartilage No acute osseous injury is identified. There is no left hip joint dislocation or joint effusion. There are severe degenerative changes of the left hip with cyst formation. Ligaments Suboptimally assessed by CT. Muscles and Tendons No abnormality noted. Soft tissues Visible portions of the pelvis and abdomen appear normal. Multiple left inguinal lymph nodes are noted, measuring up to 8 mm in short axis. Vascular calcifications are seen in the left common femoral artery. IMPRESSION: 1. No acute osseous abnormality. 2. Severe degenerative changes of the left hip. Electronically Signed   By: Zerita Boers M.D.   On: 01/18/2020 20:12   DG C-Arm 1-60 Min-No Report  Result Date: 01/22/2020 Fluoroscopy was utilized by the requesting physician.  No radiographic interpretation.   DG HIP OPERATIVE UNILAT W OR W/O PELVIS LEFT  Result Date: 01/22/2020 CLINICAL DATA:  86 year old female status post left hip arthroplasty. EXAM: OPERATIVE left HIP (WITH PELVIS IF PERFORMED) one view TECHNIQUE: Fluoroscopic spot image(s) were submitted for interpretation post-operatively. COMPARISON:  Pelvic radiograph dated 01/18/2020. FINDINGS: Two intraoperative fluoroscopic images provided. The total fluoroscopic time is 10 second and cumulative air Karma of 1.4 mGy. There has been left hip arthroplasty. A right hip arthroplasty is also noted. IMPRESSION: Status post left hip arthroplasty. Electronically Signed   By: Anner Crete M.D.   On: 01/22/2020 17:00   DG Hip Unilat W or Wo Pelvis 2-3 Views Left  Result Date: 01/18/2020 CLINICAL DATA:  Worsening left hip  pain. EXAM: DG HIP (WITH OR WITHOUT PELVIS) 2-3V LEFT COMPARISON:  None. FINDINGS: There is no evidence of fracture or dislocation. Advanced osteoarthritic changes of the left hip with bone-on-bone contact, subchondral sclerosis, subchondral cysts and osteophyte formation. Post right hip arthroplasty. Intact hardware. IMPRESSION: Advanced osteoarthritic changes of the left hip. Electronically Signed   By: Fidela Salisbury M.D.   On: 01/18/2020 18:36   VAS [place] LOWER EXTREMITY VENOUS (DVT)  Result Date: 01/20/2020  Lower Venous DVT Study Indications: LT hip pain, swelling, erythema, cellulitis.  Anticoagulation: Eliquis. Comparison Study: No prior studies. Performing Technologist: Darlin Coco, RDMS  Examination Guidelines: A complete evaluation includes B-mode imaging, spectral Doppler, color Doppler, and power Doppler as needed of all accessible portions of each vessel. Bilateral testing is considered an integral part of a complete examination. Limited examinations for reoccurring indications may be performed as noted. The reflux portion of the exam is performed with the patient in reverse Trendelenburg.  +---------+---------------+---------+-----------+----------+--------------+ RIGHT    CompressibilityPhasicitySpontaneityPropertiesThrombus Aging +---------+---------------+---------+-----------+----------+--------------+ CFV      Full           Yes      Yes                                 +---------+---------------+---------+-----------+----------+--------------+ SFJ      Full                                                        +---------+---------------+---------+-----------+----------+--------------+  FV Prox  Full                                                        +---------+---------------+---------+-----------+----------+--------------+ FV Mid   Full                                                         +---------+---------------+---------+-----------+----------+--------------+ FV DistalFull                                                        +---------+---------------+---------+-----------+----------+--------------+ PFV      Full                                                        +---------+---------------+---------+-----------+----------+--------------+ POP      Full           Yes      Yes                                 +---------+---------------+---------+-----------+----------+--------------+ PTV      Full                                                        +---------+---------------+---------+-----------+----------+--------------+ PERO     Full                                                        +---------+---------------+---------+-----------+----------+--------------+   +---------+---------------+---------+-----------+----------+--------------+ LEFT     CompressibilityPhasicitySpontaneityPropertiesThrombus Aging +---------+---------------+---------+-----------+----------+--------------+ CFV      Full           Yes      Yes                                 +---------+---------------+---------+-----------+----------+--------------+ SFJ      Full                                                        +---------+---------------+---------+-----------+----------+--------------+ FV Prox  Full                                                        +---------+---------------+---------+-----------+----------+--------------+  FV Mid   Full                                                        +---------+---------------+---------+-----------+----------+--------------+ FV DistalFull                                                        +---------+---------------+---------+-----------+----------+--------------+ PFV      Full                                                         +---------+---------------+---------+-----------+----------+--------------+ POP      Full           Yes      Yes                                 +---------+---------------+---------+-----------+----------+--------------+ PTV      Full                                                        +---------+---------------+---------+-----------+----------+--------------+ PERO     Full                                                        +---------+---------------+---------+-----------+----------+--------------+     Summary: RIGHT: - There is no evidence of deep vein thrombosis in the lower extremity.  - No cystic structure found in the popliteal fossa.  LEFT: - There is no evidence of deep vein thrombosis in the lower extremity.  - No cystic structure found in the popliteal fossa.  *See table(s) above for measurements and observations. Electronically signed by Jamelle Haring on 01/20/2020 at 12:39:45 PM.    Final     Microbiology: Recent Results (from the past 240 hour(s))  Resp Panel by RT-PCR (Flu A&B, Covid) Nasopharyngeal Swab     Status: None   Collection Time: 01/19/20  1:11 AM   Specimen: Nasopharyngeal Swab; Nasopharyngeal(NP) swabs in vial transport medium  Result Value Ref Range Status   SARS Coronavirus 2 by RT PCR NEGATIVE NEGATIVE Final    Comment: (NOTE) SARS-CoV-2 target nucleic acids are NOT DETECTED.  The SARS-CoV-2 RNA is generally detectable in upper respiratory specimens during the acute phase of infection. The lowest concentration of SARS-CoV-2 viral copies this assay can detect is 138 copies/mL. A negative result does not preclude SARS-Cov-2 infection and should not be used as the sole basis for treatment or other patient management decisions. A negative result may occur with  improper specimen collection/handling, submission of specimen other than nasopharyngeal swab, presence of viral mutation(s) within the areas targeted by this assay,  and inadequate number  of viral copies(<138 copies/mL). A negative result must be combined with clinical observations, patient history, and epidemiological information. The expected result is Negative.  Fact Sheet for Patients:  EntrepreneurPulse.com.au  Fact Sheet for Healthcare Providers:  IncredibleEmployment.be  This test is no t yet approved or cleared by the [place] FDA and  has been authorized for detection and/or diagnosis of SARS-CoV-2 by FDA under an Emergency Use Authorization (EUA). This EUA will remain  in effect (meaning this test can be used) for the duration of the COVID-19 declaration under Section 564(b)(1) of the Act, 21 U.S.C.section 360bbb-3(b)(1), unless the authorization is terminated  or revoked sooner.       Influenza A by PCR NEGATIVE NEGATIVE Final   Influenza B by PCR NEGATIVE NEGATIVE Final    Comment: (NOTE) The Xpert Xpress SARS-CoV-2/FLU/RSV plus assay is intended as an aid in the diagnosis of influenza from Nasopharyngeal swab specimens and should not be used as a sole basis for treatment. Nasal washings and aspirates are unacceptable for Xpert Xpress SARS-CoV-2/FLU/RSV testing.  Fact Sheet for Patients: EntrepreneurPulse.com.au  Fact Sheet for Healthcare Providers: IncredibleEmployment.be  This test is not yet approved or cleared by the [place] FDA and has been authorized for detection and/or diagnosis of SARS-CoV-2 by FDA under an Emergency Use Authorization (EUA). This EUA will remain in effect (meaning this test can be used) for the duration of the COVID-19 declaration under Section 564(b)(1) of the Act, 21 U.S.C. section 360bbb-3(b)(1), unless the authorization is terminated or revoked.  Performed at [facility], [place] [address] [place], [place] [address]   SARS Coronavirus 2 by RT PCR (hospital order, performed in [facility] hospital lab) Nasopharyngeal  Nasopharyngeal Swab     Status: None   Collection Time: 01/26/20  7:26 PM   Specimen: Nasopharyngeal Swab  Result Value Ref Range Status   SARS Coronavirus 2 NEGATIVE NEGATIVE Final    Comment: (NOTE) SARS-CoV-2 target nucleic acids are NOT DETECTED.  The SARS-CoV-2 RNA is generally detectable in upper and lower respiratory specimens during the acute phase of infection. The lowest concentration of SARS-CoV-2 viral copies this assay can detect is 250 copies / mL. A negative result does not preclude SARS-CoV-2 infection and should not be used as the sole basis for treatment or other patient management decisions.  A negative result may occur with improper specimen collection / handling, submission of specimen other than nasopharyngeal swab, presence of viral mutation(s) within the areas targeted by this assay, and inadequate number of viral copies (<250 copies / mL). A negative result must be combined with clinical observations, patient history, and epidemiological information.  Fact Sheet for Patients:   StrictlyIdeas.no  Fact Sheet for Healthcare Providers: BankingDealers.co.za  This test is not yet approved or  cleared by the [place] FDA and has been authorized for detection and/or diagnosis of SARS-CoV-2 by FDA under an Emergency Use Authorization (EUA).  This EUA will remain in effect (meaning this test can be used) for the duration of the COVID-19 declaration under Section 564(b)(1) of the Act, 21 U.S.C. section 360bbb-3(b)(1), unless the authorization is terminated or revoked sooner.  Performed at [facility], [place] [address], [place], [place] [address]      Labs: CBC: Recent Labs  Lab 01/23/20 0436 01/24/20 0421 01/25/20 0600  WBC 14.9* 15.8* 14.6*  HGB 11.8* 10.9* 10.4*  HCT 35.4* 32.4* 31.1*  MCV 96.7 96.1 96.0  PLT 297 292 289  Basic Metabolic Panel: Recent Labs  Lab 01/21/20 0525  01/21/20 0525 01/23/20 0436 01/24/20 0421 01/25/20 0600 01/26/20 0434 01/27/20 0447  NA 136   < > 134* 136 135 134* 136  K 3.7   < > 4.0 4.0 3.7 3.4* 3.5  CL 100   < > 98 99 97* 97* 99  CO2 26   < > 25 27 26 26 27   GLUCOSE 101*   < > 161* 155* 122* 114* 109*  BUN 14   < > 11 18 26* 20 21  CREATININE 0.85   < > 0.73 0.89 0.73 0.67 0.89  CALCIUM 8.5*   < > 8.5* 8.6* 8.7* 8.1* 8.1*  MG 2.1  --   --   --  1.7  --   --    < > = values in this interval not displayed.   Liver Function Tests: No results for input(s): AST, ALT, ALKPHOS, BILITOT, PROT, ALBUMIN in the last 168 hours. CBG: Recent Labs  Lab 01/27/20 0728 01/27/20 1204  GLUCAP 108* 148*    Time spent: 35 minutes  Signed:  Berle Mull  [facility]  01/27/2020 12:14 PM

## 2020-01-27 NOTE — TOC Progression Note (Signed)
Transition of Care [facility]) - Progression Note    Patient Details  Name: Dana Bradford MRN: [id number] Date of Birth: 1930/10/23  Transition of Care [facility]) CM/SW Contact  Purcell Mouton, RN Phone Number: 01/27/2020, 11:47 AM  Clinical Narrative:     PTAR/Transportation was called.     Expected Discharge Plan: [place] Barriers to Discharge: No Barriers Identified  Expected Discharge Plan and Services Expected Discharge Plan: [place] arrangements for the past 2 months: Single Family Home Expected Discharge Date: 01/27/20                                     Social Determinants of Health (SDOH) Interventions    Readmission Risk Interventions No flowsheet data found.

## 2020-01-27 NOTE — TOC Progression Note (Signed)
Transition of Care [facility]) - Progression Note    Patient Details  Name: Dana Bradford MRN: [id number] Date of Birth: 1930-03-23  Transition of Care [facility]) CM/SW Contact  Purcell Mouton, RN Phone Number: 01/27/2020, 11:01 AM  Clinical Narrative:    Spoke with pt's daughter concerning SNF facility. [place] was selected. Will need COVID test.    Expected Discharge Plan: [place] Barriers to Discharge: No Barriers Identified  Expected Discharge Plan and Services Expected Discharge Plan: [place] arrangements for the past 2 months: Single Family Home Expected Discharge Date: 01/24/20                                     Social Determinants of Health (SDOH) Interventions    Readmission Risk Interventions No flowsheet data found.

## 2020-01-27 NOTE — Progress Notes (Signed)
   Subjective: 5 Days Post-Op Procedure(s) (LRB): TOTAL HIP ARTHROPLASTY ANTERIOR APPROACH (Left) Patient reports pain as mild.   Patient seen in rounds for Dr. Wynelle Link. Patient is well, and has had no acute complaints or problems. She is slightly confused this AM, but very pleasant. Denies chest pain or SOB. No issues overnight.  Plan is to go Skilled nursing facility after hospital stay.  Objective: Vital signs in last 24 hours: Temp:  [98.3 F (36.8 C)-98.7 F (37.1 C)] 98.4 F (36.9 C) (12/06 0525) Pulse Rate:  [69-87] 69 (12/06 0525) Resp:  [14-18] 18 (12/06 0525) BP: (103-144)/(65-83) 144/83 (12/06 0525) SpO2:  [92 %] 92 % (12/06 0525)  Intake/Output from previous day:  Intake/Output Summary (Last 24 hours) at 01/27/2020 0929 Last data filed at 01/27/2020 0000 Gross per 24 hour  Intake 340 ml  Output 250 ml  Net 90 ml    Intake/Output this shift: No intake/output data recorded.  Labs: Recent Labs    01/25/20 0600  HGB 10.4*   Recent Labs    01/25/20 0600  WBC 14.6*  RBC 3.24*  HCT 31.1*  PLT 289   Recent Labs    01/26/20 0434 01/27/20 0447  NA 134* 136  K 3.4* 3.5  CL 97* 99  CO2 26 27  BUN 20 21  CREATININE 0.67 0.89  GLUCOSE 114* 109*  CALCIUM 8.1* 8.1*   No results for input(s): LABPT, INR in the last 72 hours.  Exam: General - Patient is Alert Extremity - Neurologically intact Neurovascular intact Sensation intact distally Dorsiflexion/Plantar flexion intact Dressing/Incision - clean, dry, no drainage. Aquacel in place. Motor Function - intact, moving foot and toes well on exam.   Past Medical History:  Diagnosis Date  . Acute kidney failure ([place]) 2016 to 2017 due to vacomycin   no kidney issues now  . Anemia   . Anxiety   . Arthritis   . Atrial fibrillation ([place])   . Depression   . Diabetes mellitus without complication ([place])   . Dyspnea   . Food allergy    Fish (Hives)   . Glaucoma   . Hypertension   . Impaired fasting  glucose   . Other specified acquired hypothyroidism   . Pneumonia   . Restless leg   . Restless leg   . Skin cancer    Forehead  . Stroke ([place]) 05/2017   ocular stoke on plavix , right eye some vision loss  . Venous stasis ulcer (HCC)     Assessment/Plan: 5 Days Post-Op Procedure(s) (LRB): TOTAL HIP ARTHROPLASTY ANTERIOR APPROACH (Left) Principal Problem:   Left leg cellulitis Active Problems:   Essential hypertension, benign   GERD (gastroesophageal reflux disease)   OA (osteoarthritis) of hip   Dehydration   Hypotension   Hypothyroidism   Hyperlipidemia   AF (paroxysmal atrial fibrillation) (HCC)  Estimated body mass index is 31.94 kg/m as calculated from the following:   Height as of this encounter: 4' 9.99" (1.473 m).   Weight as of this encounter: 69.3 kg. Up with therapy  DVT Prophylaxis - Eliquis Weight-bearing as tolerated  Plan for discharge to SNF once bed available. Will follow-up with [place] December 14th in the office.  Dana Duty, PA-C Orthopedic Surgery [phone] 01/27/2020, 9:29 AM

## 2020-01-27 NOTE — Progress Notes (Signed)
Physical Therapy Treatment Patient Details Name: Dana Bradford MRN: [id number] DOB: 06-19-1930 Today's Date: 01/27/2020    History of Present Illness Pt is an 86 y/o female admitted secondary to L hip pain with cellulitis.  PMH including but not limited to Afib (on Eliquis), venous stasis dermatitis, HLD, Hypothyroidism, hypertension, RLS, s/p bilateral knee replacement..  Pt no s/p L THR by anterior direct approach 01/22/20    PT Comments    POD # 5 General Comments: AxO x 3 following all commands but slightly confused with past events  Assisted OOB.  General bed mobility comments: pt required increased assist this session esp with scooting EOB due to pain and effort.  General transfer comment: required increased assist this session with great difficulty achieve full upright posture due to pain.  First assisted to [facility] then off [facility] each time required + 2 assist. General Gait Details: Very difficult gait and limited distance.  Poor forward flex posture and great difficulty weight shifting onto L LE to advance R LE.  Required + 2 assist and recliner following behind. Pt lives home alone and will need ST Rehab at SNF to address mobility decline.   Follow Up Recommendations  SNF     Equipment Recommendations  Rolling walker with 5" wheels    Recommendations for Other Services       Precautions / Restrictions Precautions Precautions: Fall Restrictions Other Position/Activity Restrictions: WBAT    Mobility  Bed Mobility Overal bed mobility: Needs Assistance Bed Mobility: Supine to Sit     Supine to sit: Mod assist;Max assist     General bed mobility comments: pt required increased assist this session esp with scooting EOB due to pain and effort  Transfers Overall transfer level: Needs assistance Equipment used: Rolling walker (2 wheeled) (youth) Transfers: Sit to/from [facility] Sit to Stand: Max assist;+2 physical assistance;+2 safety/equipment Stand pivot  transfers: Max assist;Total assist;+2 physical assistance;+2 safety/equipment       General transfer comment: required increased assist this session with great difficulty achieve full upright posture due to pain.  First assisted to [facility] then off [facility] each time required + 2 assist.  Ambulation/Gait Ambulation/Gait assistance: Mod assist;+2 safety/equipment Gait Distance (Feet): 2 Feet Assistive device: Rolling walker (2 wheeled) Gait Pattern/deviations: Step-to pattern;Decreased step length - right;Decreased stride length;Trunk flexed Gait velocity: decreased   General Gait Details: Very difficult gait and limited distance.  Poor forward flex posture and great difficulty weight shifting onto L LE to advance R LE.  Required + 2 assist and recliner following behind.   Stairs             Wheelchair Mobility    Modified Rankin (Stroke Patients Only)       Balance                                            Cognition Arousal/Alertness: Awake/alert Behavior During Therapy: WFL for tasks assessed/performed Overall Cognitive Status: No family/caregiver present to determine baseline cognitive functioning                                 General Comments: AxO x 3 following all commands but slightly confused with past events      Exercises      General Comments        Pertinent  Vitals/Pain Pain Assessment: 0-10 Pain Score: 9  Pain Location: L hip/thigh, groin Pain Descriptors / Indicators: Aching;Discomfort;Burning;Operative site guarding;Radiating Pain Intervention(s): Monitored during session;Repositioned;Patient requesting pain meds-RN notified    Home Living                      Prior Function            PT Goals (current goals can now be found in the care plan section) Progress towards PT goals: Progressing toward goals    Frequency    7X/week      PT Plan Current plan remains appropriate    Co-evaluation               AM-PAC PT "6 Clicks" Mobility   Outcome Measure  Help needed turning from your back to your side while in a flat bed without using bedrails?: A Lot Help needed moving from lying on your back to sitting on the side of a flat bed without using bedrails?: A Lot Help needed moving to and from a bed to a chair (including a wheelchair)?: A Lot Help needed standing up from a chair using your arms (e.g., wheelchair or bedside chair)?: A Lot Help needed to walk in hospital room?: Total Help needed climbing 3-5 steps with a railing? : Total 6 Click Score: 10    End of Session Equipment Utilized During Treatment: Gait belt Activity Tolerance: No increased pain;Patient limited by fatigue Patient left: in chair;with call bell/phone within reach;with chair alarm set Nurse Communication: Mobility status (reported to NT pt had a large BM) PT Visit Diagnosis: Muscle weakness (generalized) (M62.81);Other abnormalities of gait and mobility (R26.89)     Time: 1031-1110 PT Time Calculation (min) (ACUTE ONLY): 39 min  Charges:  $Gait Training: 8-22 mins $Therapeutic Activity: 23-37 mins                     Rica Koyanagi  PTA Acute  Rehabilitation Services Pager      [phone] Office      [phone]

## 2020-01-31 DIAGNOSIS — Z96642 Presence of left artificial hip joint: Secondary | ICD-10-CM | POA: Insufficient documentation

## 2020-02-24 IMAGING — DX DG PORTABLE PELVIS
1 series · 1 of 1 positions shown · non-contrast
Comparison: Intraoperative imaging earlier today.

CLINICAL DATA: Status post right hip replacement today.

EXAM:
PORTABLE PELVIS 1-2 VIEWS

[pelvis ap]
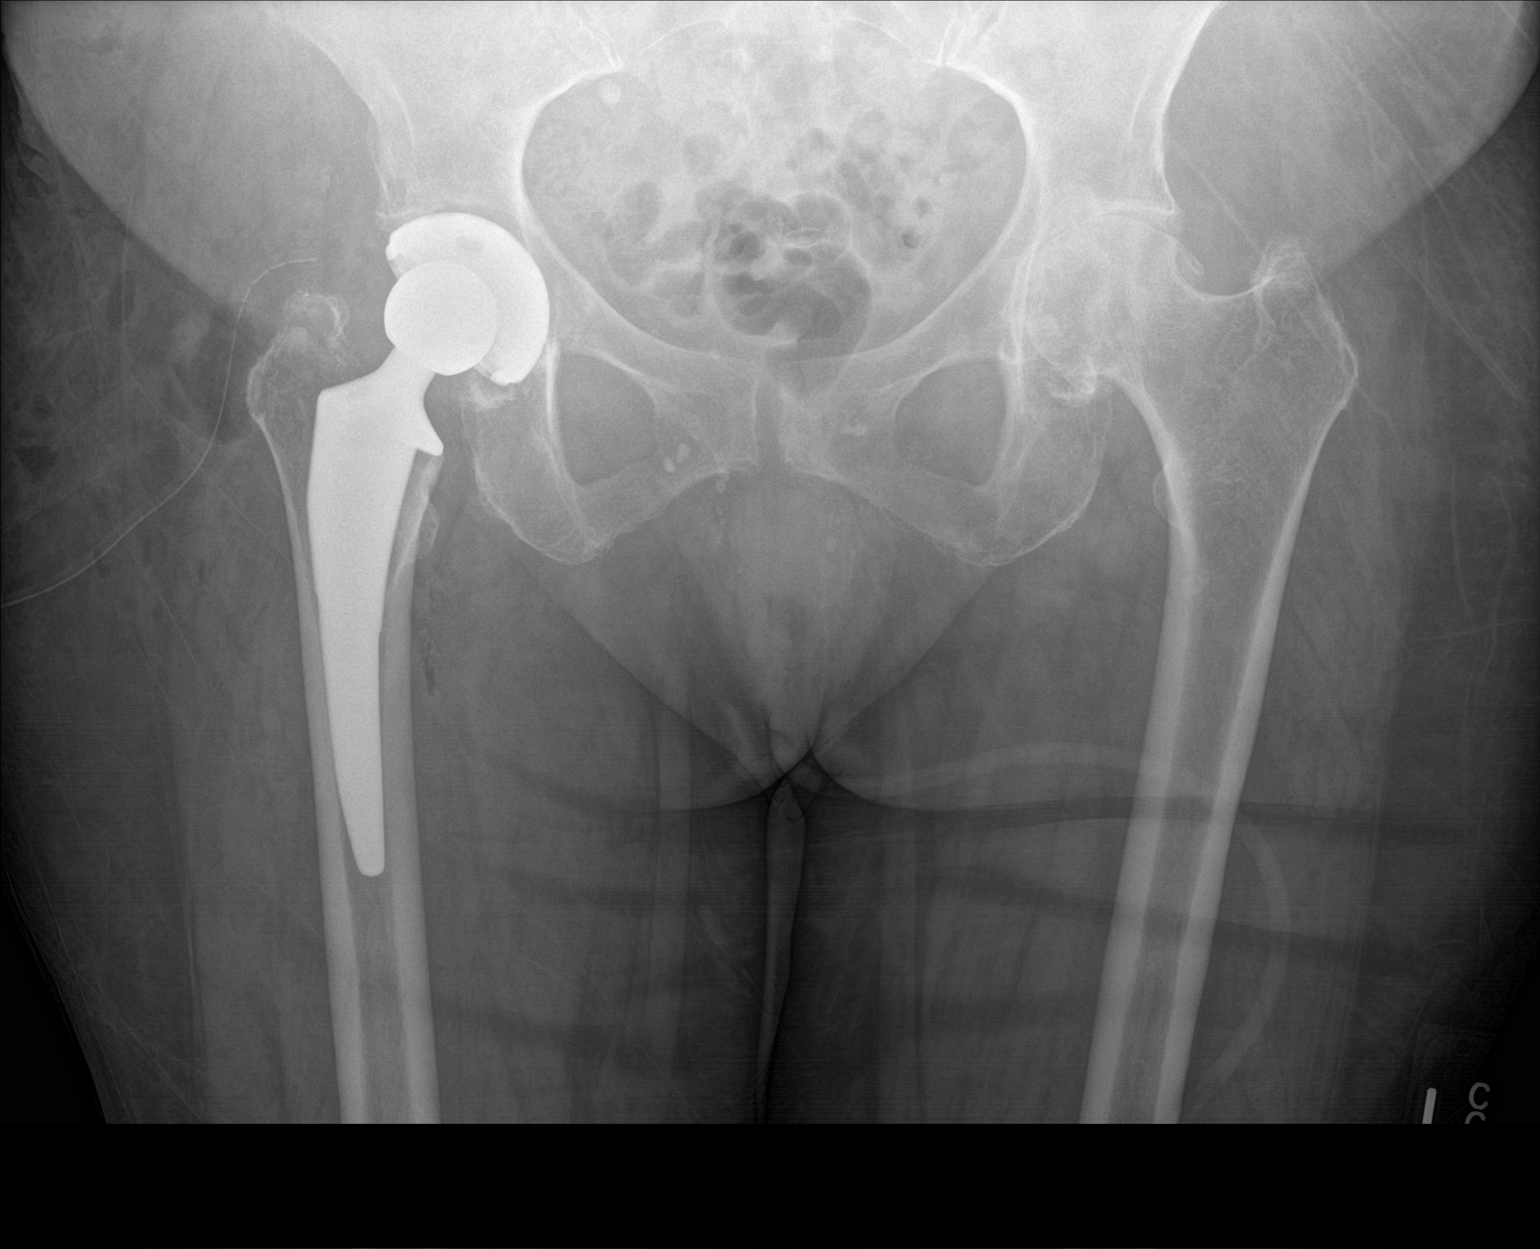

[1 of 1 positions shown; findings below may reference images not displayed]

FINDINGS: Right total hip arthroplasty is in place. The device is located. No
fracture. Surgical drain and gas in the soft tissues noted. Advanced
left hip osteoarthritis is seen.
IMPRESSION: Status post right total hip replacement.  No acute finding.

Advanced left hip osteoarthritis.

## 2020-02-24 IMAGING — RF DG HIP (WITH PELVIS) OPERATIVE*R*
1 series · 2 of 2 positions shown · non-contrast
Comparison: None.

CLINICAL DATA: Right hip replacement.

EXAM:
OPERATIVE right HIP (WITH PELVIS IF PERFORMED) 2 VIEWS
TECHNIQUE: Fluoroscopic spot image(s) were submitted for interpretation
post-operatively.
Radiation exposure index: 0.5206 mGy.

[Series 1: unknown protocol · 0.20mm/px · 2 of 2 slices shown]
[im 1/2]
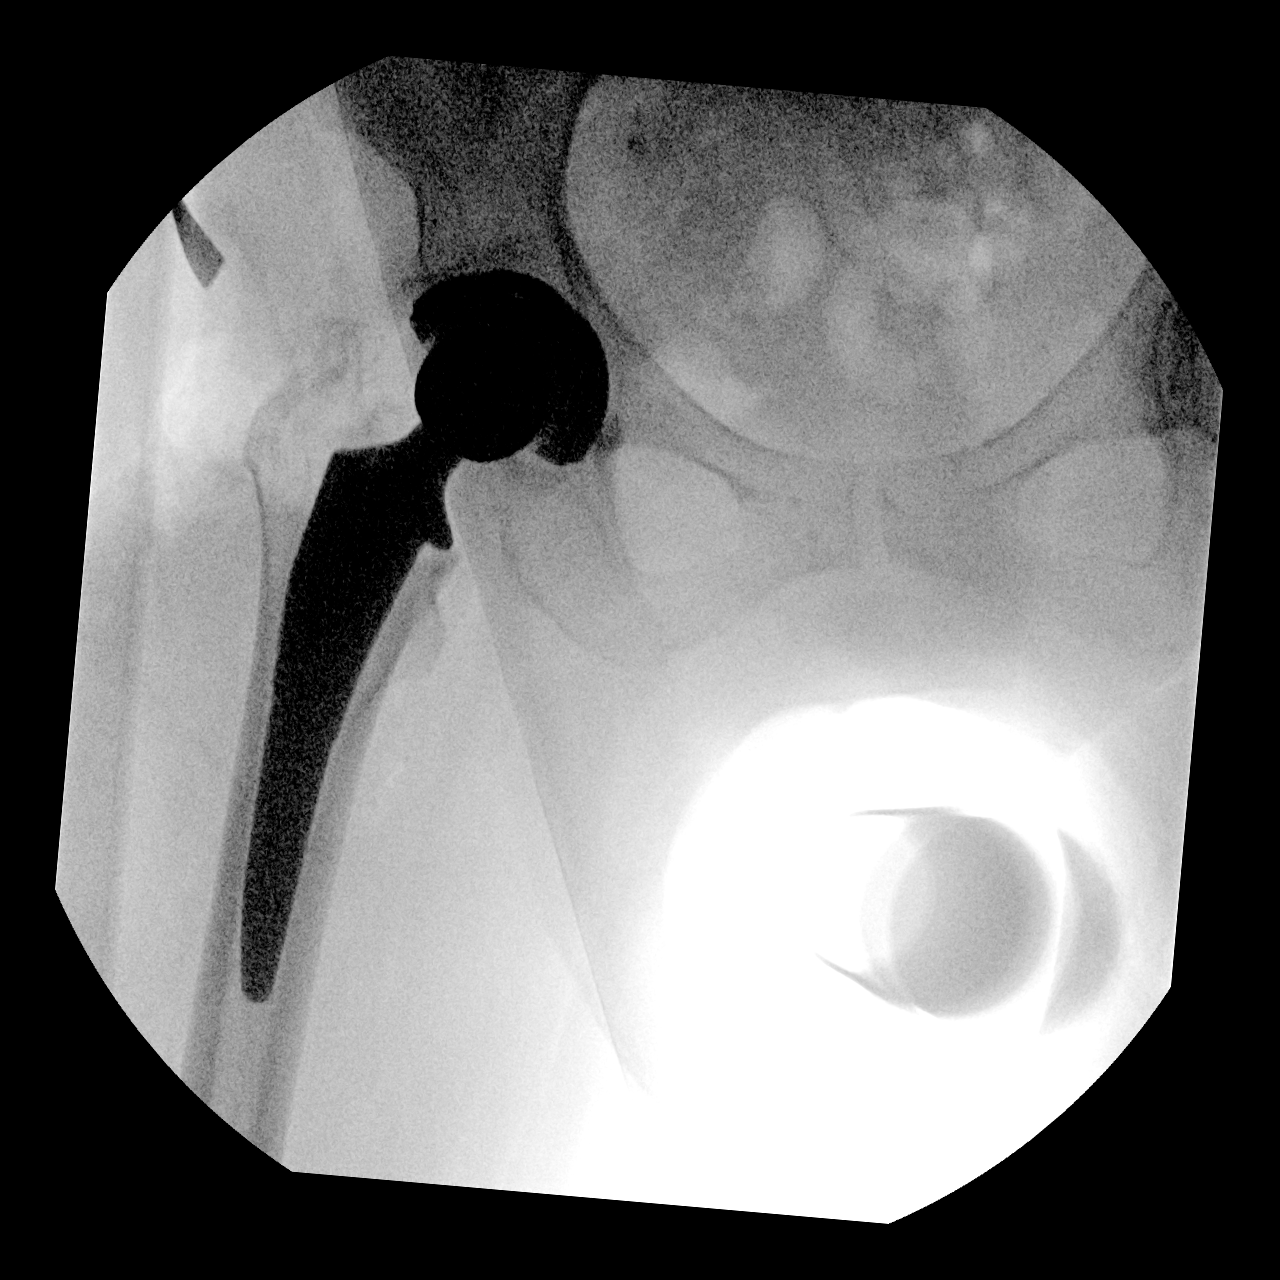
[im 2/2]
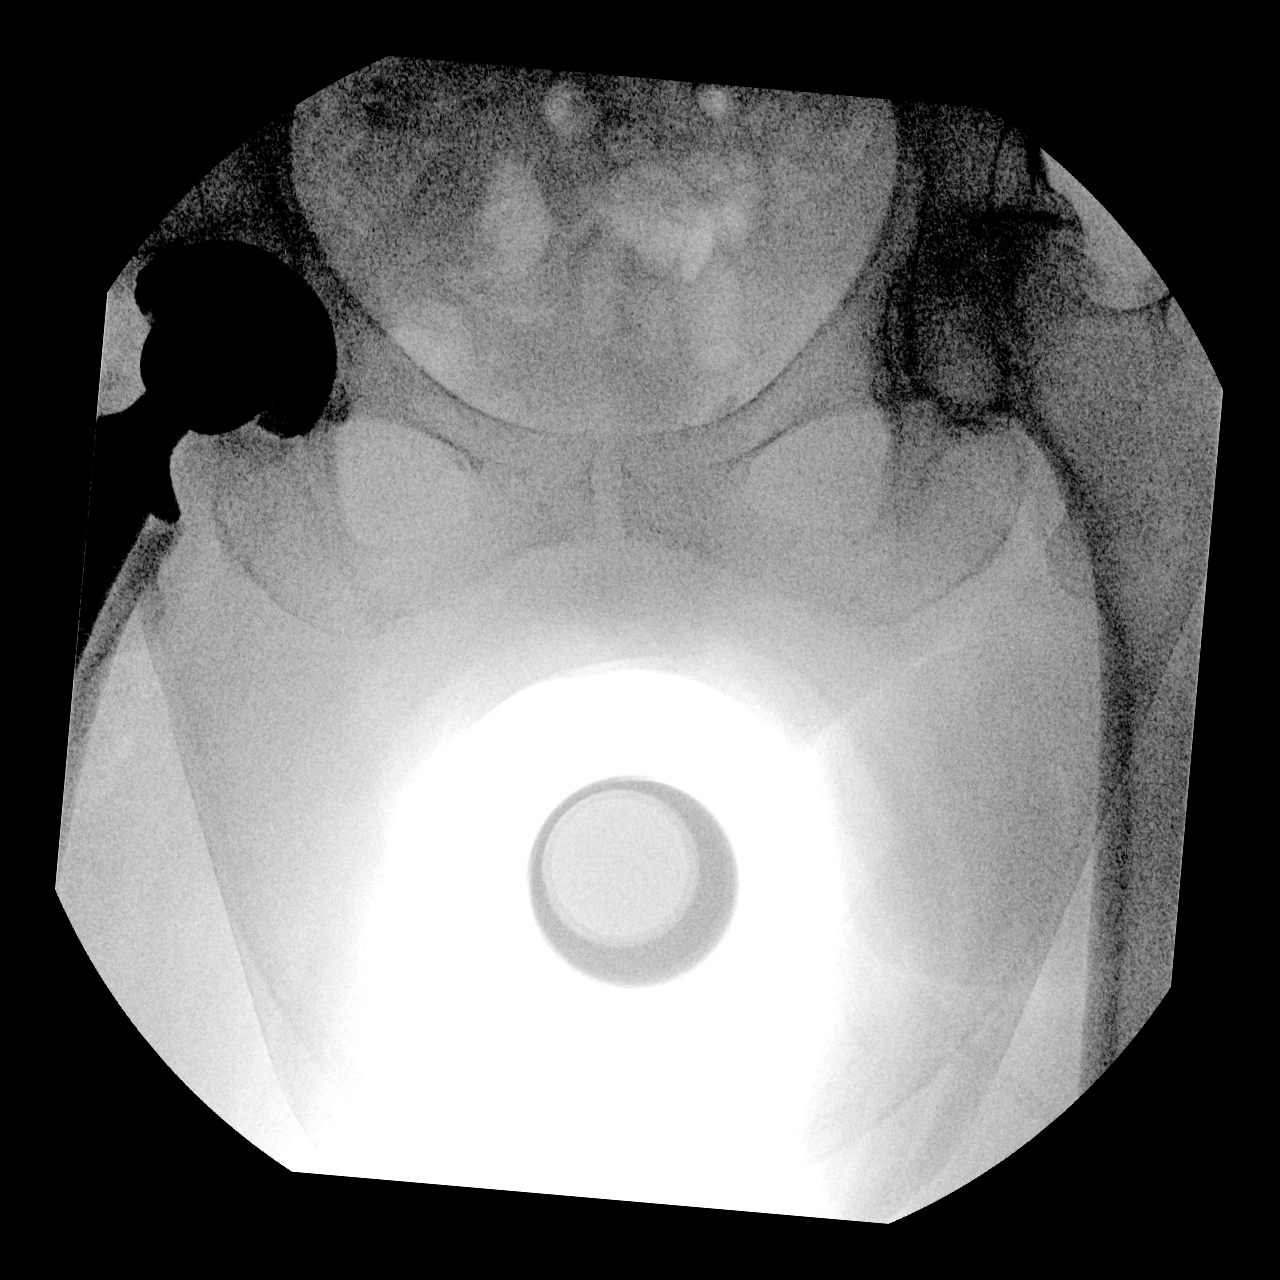

[2 of 2 positions shown; findings below may reference images not displayed]

FINDINGS: Two intraoperative fluoroscopic images of the right hip demonstrate
the patient be status post right total hip arthroplasty. The femoral
and acetabular components appear to be well situated.
IMPRESSION: Status post right total hip arthroplasty.

## 2020-04-22 ENCOUNTER — Emergency Department (HOSPITAL_COMMUNITY): Payer: Medicare Other

## 2020-04-22 ENCOUNTER — Inpatient Hospital Stay (HOSPITAL_COMMUNITY)
Admission: EM | Admit: 2020-04-22 | Discharge: 2020-04-27 | DRG: 291 | Disposition: A | Discharge status: Home / Self Care | Payer: Medicare Other | Attending: Internal Medicine | Admitting: Internal Medicine

## 2020-04-22 ENCOUNTER — Encounter (HOSPITAL_COMMUNITY): Payer: Self-pay | Admitting: Emergency Medicine

## 2020-04-22 ENCOUNTER — Other Ambulatory Visit: Payer: Self-pay

## 2020-04-22 DIAGNOSIS — Z79890 Hormone replacement therapy (postmenopausal): Secondary | ICD-10-CM

## 2020-04-22 DIAGNOSIS — Z8673 Personal history of transient ischemic attack (TIA), and cerebral infarction without residual deficits: Secondary | ICD-10-CM

## 2020-04-22 DIAGNOSIS — L03115 Cellulitis of right lower limb: Secondary | ICD-10-CM | POA: Diagnosis present

## 2020-04-22 DIAGNOSIS — Z91013 Allergy to seafood: Secondary | ICD-10-CM

## 2020-04-22 DIAGNOSIS — F32A Depression, unspecified: Secondary | ICD-10-CM | POA: Diagnosis present

## 2020-04-22 DIAGNOSIS — G2581 Restless legs syndrome: Secondary | ICD-10-CM | POA: Diagnosis present

## 2020-04-22 DIAGNOSIS — I110 Hypertensive heart disease with heart failure: Secondary | ICD-10-CM | POA: Diagnosis not present

## 2020-04-22 DIAGNOSIS — I482 Chronic atrial fibrillation, unspecified: Secondary | ICD-10-CM | POA: Diagnosis present

## 2020-04-22 DIAGNOSIS — Z96641 Presence of right artificial hip joint: Secondary | ICD-10-CM | POA: Diagnosis present

## 2020-04-22 DIAGNOSIS — Z79899 Other long term (current) drug therapy: Secondary | ICD-10-CM

## 2020-04-22 DIAGNOSIS — Z66 Do not resuscitate: Secondary | ICD-10-CM | POA: Diagnosis present

## 2020-04-22 DIAGNOSIS — Z90710 Acquired absence of both cervix and uterus: Secondary | ICD-10-CM

## 2020-04-22 DIAGNOSIS — H409 Unspecified glaucoma: Secondary | ICD-10-CM | POA: Diagnosis present

## 2020-04-22 DIAGNOSIS — N179 Acute kidney failure, unspecified: Secondary | ICD-10-CM

## 2020-04-22 DIAGNOSIS — Z85828 Personal history of other malignant neoplasm of skin: Secondary | ICD-10-CM

## 2020-04-22 DIAGNOSIS — E119 Type 2 diabetes mellitus without complications: Secondary | ICD-10-CM | POA: Diagnosis present

## 2020-04-22 DIAGNOSIS — I5033 Acute on chronic diastolic (congestive) heart failure: Secondary | ICD-10-CM | POA: Diagnosis present

## 2020-04-22 DIAGNOSIS — I509 Heart failure, unspecified: Secondary | ICD-10-CM

## 2020-04-22 DIAGNOSIS — E876 Hypokalemia: Secondary | ICD-10-CM | POA: Diagnosis present

## 2020-04-22 DIAGNOSIS — L03119 Cellulitis of unspecified part of limb: Secondary | ICD-10-CM | POA: Diagnosis present

## 2020-04-22 DIAGNOSIS — Z96643 Presence of artificial hip joint, bilateral: Secondary | ICD-10-CM | POA: Diagnosis present

## 2020-04-22 DIAGNOSIS — E039 Hypothyroidism, unspecified: Secondary | ICD-10-CM | POA: Diagnosis present

## 2020-04-22 DIAGNOSIS — Z87891 Personal history of nicotine dependence: Secondary | ICD-10-CM

## 2020-04-22 DIAGNOSIS — Z7984 Long term (current) use of oral hypoglycemic drugs: Secondary | ICD-10-CM

## 2020-04-22 DIAGNOSIS — I10 Essential (primary) hypertension: Secondary | ICD-10-CM | POA: Diagnosis present

## 2020-04-22 DIAGNOSIS — E871 Hypo-osmolality and hyponatremia: Secondary | ICD-10-CM | POA: Diagnosis present

## 2020-04-22 DIAGNOSIS — I480 Paroxysmal atrial fibrillation: Secondary | ICD-10-CM | POA: Diagnosis present

## 2020-04-22 DIAGNOSIS — Z9011 Acquired absence of right breast and nipple: Secondary | ICD-10-CM

## 2020-04-22 DIAGNOSIS — Z96653 Presence of artificial knee joint, bilateral: Secondary | ICD-10-CM | POA: Diagnosis present

## 2020-04-22 DIAGNOSIS — T501X5A Adverse effect of loop [high-ceiling] diuretics, initial encounter: Secondary | ICD-10-CM | POA: Diagnosis present

## 2020-04-22 DIAGNOSIS — L0390 Cellulitis, unspecified: Secondary | ICD-10-CM

## 2020-04-22 DIAGNOSIS — Z9081 Acquired absence of spleen: Secondary | ICD-10-CM

## 2020-04-22 DIAGNOSIS — Z7901 Long term (current) use of anticoagulants: Secondary | ICD-10-CM

## 2020-04-22 DIAGNOSIS — Z20822 Contact with and (suspected) exposure to covid-19: Secondary | ICD-10-CM | POA: Diagnosis present

## 2020-04-22 DIAGNOSIS — R600 Localized edema: Secondary | ICD-10-CM

## 2020-04-22 HISTORY — DX: Heart failure, unspecified: I50.9

## 2020-04-22 HISTORY — DX: Acute kidney failure, unspecified: N17.9

## 2020-04-22 LAB — COMPREHENSIVE METABOLIC PANEL
ALT: 24 U/L (ref 0–44)
AST: 24 U/L (ref 15–41)
Albumin: 3.2 g/dL — ABNORMAL LOW (ref 3.5–5.0)
Alkaline Phosphatase: 95 U/L (ref 38–126)
Anion gap: 12 (ref 5–15)
BUN: 59 mg/dL — ABNORMAL HIGH (ref 8–23)
CO2: 29 mmol/L (ref 22–32)
Calcium: 9.3 mg/dL (ref 8.9–10.3)
Chloride: 95 mmol/L — ABNORMAL LOW (ref 98–111)
Creatinine, Ser: 1.54 mg/dL — ABNORMAL HIGH (ref 0.44–1.00)
GFR, Estimated: 32 mL/min — ABNORMAL LOW (ref >60)
Glucose, Bld: 113 mg/dL — ABNORMAL HIGH (ref 70–99)
Potassium: 3.1 mmol/L — ABNORMAL LOW (ref 3.5–5.1)
Sodium: 136 mmol/L (ref 135–145)
Total Bilirubin: 0.8 mg/dL (ref 0.3–1.2)
Total Protein: 8.2 g/dL — ABNORMAL HIGH (ref 6.5–8.1)

## 2020-04-22 LAB — CBC WITH DIFFERENTIAL/PLATELET
Abs Immature Granulocytes: 0.04 10*3/uL (ref 0.00–0.07)
Basophils Absolute: 0.1 10*3/uL (ref 0.0–0.1)
Basophils Relative: 1 %
Eosinophils Absolute: 0.2 10*3/uL (ref 0.0–0.5)
Eosinophils Relative: 2 %
HCT: 39.1 % (ref 36.0–46.0)
Hemoglobin: 12.9 g/dL (ref 12.0–15.0)
Immature Granulocytes: 0 %
Lymphocytes Relative: 22 %
Lymphs Abs: 2.1 10*3/uL (ref 0.7–4.0)
MCH: 31.1 pg (ref 26.0–34.0)
MCHC: 33 g/dL (ref 30.0–36.0)
MCV: 94.2 fL (ref 80.0–100.0)
Monocytes Absolute: 1.1 10*3/uL — ABNORMAL HIGH (ref 0.1–1.0)
Monocytes Relative: 12 %
Neutro Abs: 6 10*3/uL (ref 1.7–7.7)
Neutrophils Relative %: 63 %
Platelets: 376 10*3/uL (ref 150–400)
RBC: 4.15 MIL/uL (ref 3.87–5.11)
RDW: 14.6 % (ref 11.5–15.5)
WBC: 9.4 10*3/uL (ref 4.0–10.5)
nRBC: 0 % (ref 0.0–0.2)

## 2020-04-22 LAB — BRAIN NATRIURETIC PEPTIDE: B Natriuretic Peptide: 908 pg/mL — ABNORMAL HIGH (ref 0.0–100.0)

## 2020-04-22 LAB — LACTIC ACID, PLASMA: Lactic Acid, Venous: 1.6 mmol/L (ref 0.5–1.9)

## 2020-04-22 LAB — TROPONIN I (HIGH SENSITIVITY): Troponin I (High Sensitivity): 8 ng/L (ref <18)

## 2020-04-22 MED ORDER — APIXABAN 5 MG PO TABS
5.0000 mg | ORAL_TABLET | Freq: Two times a day (BID) | ORAL | Status: DC
  Administered 2020-04-23 – 2020-04-27 (×10): 5 mg via ORAL
  Filled 2020-04-22 (×10): qty 1

## 2020-04-22 MED ORDER — PANTOPRAZOLE SODIUM 40 MG PO TBEC
40.0000 mg | DELAYED_RELEASE_TABLET | Freq: Every day | ORAL | Status: DC
  Administered 2020-04-23 – 2020-04-27 (×5): 40 mg via ORAL
  Filled 2020-04-22 (×5): qty 1

## 2020-04-22 MED ORDER — PREGABALIN 25 MG PO CAPS
25.0000 mg | ORAL_CAPSULE | Freq: Every day | ORAL | Status: DC
  Administered 2020-04-23 – 2020-04-27 (×5): 25 mg via ORAL
  Filled 2020-04-22 (×6): qty 1

## 2020-04-22 MED ORDER — MIRTAZAPINE 15 MG PO TBDP
15.0000 mg | ORAL_TABLET | Freq: Every day | ORAL | Status: DC
  Administered 2020-04-23 – 2020-04-26 (×4): 15 mg via ORAL
  Filled 2020-04-22 (×5): qty 1

## 2020-04-22 MED ORDER — SIMVASTATIN 20 MG PO TABS
20.0000 mg | ORAL_TABLET | Freq: Every evening | ORAL | Status: DC
  Administered 2020-04-23 – 2020-04-26 (×5): 20 mg via ORAL
  Filled 2020-04-22 (×5): qty 1

## 2020-04-22 MED ORDER — DILTIAZEM HCL ER COATED BEADS 180 MG PO CP24
180.0000 mg | ORAL_CAPSULE | Freq: Every day | ORAL | Status: DC
  Administered 2020-04-23 – 2020-04-27 (×5): 180 mg via ORAL
  Filled 2020-04-22 (×5): qty 1

## 2020-04-22 MED ORDER — SODIUM CHLORIDE 0.9 % IV SOLN
1.0000 g | Freq: Every day | INTRAVENOUS | Status: DC
  Administered 2020-04-23 – 2020-04-26 (×4): 1 g via INTRAVENOUS
  Filled 2020-04-22 (×3): qty 10

## 2020-04-22 MED ORDER — PRAMIPEXOLE DIHYDROCHLORIDE 1 MG PO TABS
1.0000 mg | ORAL_TABLET | Freq: Every day | ORAL | Status: DC
  Administered 2020-04-23 – 2020-04-26 (×5): 1 mg via ORAL
  Filled 2020-04-22 (×8): qty 1

## 2020-04-22 MED ORDER — ACETAMINOPHEN 325 MG PO TABS
650.0000 mg | ORAL_TABLET | Freq: Four times a day (QID) | ORAL | Status: DC | PRN
  Administered 2020-04-24 – 2020-04-25 (×2): 650 mg via ORAL
  Filled 2020-04-22 (×2): qty 2

## 2020-04-22 MED ORDER — FUROSEMIDE 10 MG/ML IJ SOLN
40.0000 mg | Freq: Two times a day (BID) | INTRAMUSCULAR | Status: DC
  Administered 2020-04-23 – 2020-04-25 (×4): 40 mg via INTRAVENOUS
  Filled 2020-04-22 (×4): qty 4

## 2020-04-22 MED ORDER — INSULIN ASPART 100 UNIT/ML ~~LOC~~ SOLN
0.0000 [IU] | Freq: Three times a day (TID) | SUBCUTANEOUS | Status: DC
  Administered 2020-04-23: 1 [IU] via SUBCUTANEOUS
  Administered 2020-04-24: 2 [IU] via SUBCUTANEOUS
  Administered 2020-04-25 – 2020-04-26 (×5): 1 [IU] via SUBCUTANEOUS

## 2020-04-22 MED ORDER — FUROSEMIDE 10 MG/ML IJ SOLN
40.0000 mg | Freq: Once | INTRAMUSCULAR | Status: AC
  Administered 2020-04-22: 40 mg via INTRAVENOUS
  Filled 2020-04-22: qty 4

## 2020-04-22 MED ORDER — FERROUS SULFATE 325 (65 FE) MG PO TABS
325.0000 mg | ORAL_TABLET | Freq: Two times a day (BID) | ORAL | Status: DC
  Administered 2020-04-23 – 2020-04-27 (×10): 325 mg via ORAL
  Filled 2020-04-22 (×10): qty 1

## 2020-04-22 MED ORDER — LATANOPROST 0.005 % OP SOLN
1.0000 [drp] | Freq: Every day | OPHTHALMIC | Status: DC
  Administered 2020-04-23 – 2020-04-26 (×4): 1 [drp] via OPHTHALMIC
  Filled 2020-04-22: qty 2.5

## 2020-04-22 MED ORDER — BRIMONIDINE TARTRATE 0.2 % OP SOLN
1.0000 [drp] | Freq: Two times a day (BID) | OPHTHALMIC | Status: DC
  Administered 2020-04-23 – 2020-04-27 (×9): 1 [drp] via OPHTHALMIC
  Filled 2020-04-22 (×2): qty 5

## 2020-04-22 MED ORDER — METOPROLOL SUCCINATE ER 100 MG PO TB24
100.0000 mg | ORAL_TABLET | Freq: Two times a day (BID) | ORAL | Status: DC
  Administered 2020-04-23 – 2020-04-27 (×8): 100 mg via ORAL
  Filled 2020-04-22 (×9): qty 1

## 2020-04-22 MED ORDER — ACETAMINOPHEN 650 MG RE SUPP: 650.0000 mg | Freq: Four times a day (QID) | RECTAL | Status: DC | PRN

## 2020-04-22 MED ORDER — LEVOTHYROXINE SODIUM 88 MCG PO TABS
88.0000 ug | ORAL_TABLET | Freq: Every day | ORAL | Status: DC
  Administered 2020-04-23 – 2020-04-27 (×5): 88 ug via ORAL
  Filled 2020-04-22 (×5): qty 1

## 2020-04-22 MED ORDER — HYDROCODONE-ACETAMINOPHEN 5-325 MG PO TABS
1.0000 | ORAL_TABLET | Freq: Once | ORAL | Status: AC
  Administered 2020-04-22: 1 via ORAL
  Filled 2020-04-22: qty 1

## 2020-04-22 MED ORDER — POTASSIUM CHLORIDE CRYS ER 20 MEQ PO TBCR
40.0000 meq | EXTENDED_RELEASE_TABLET | Freq: Once | ORAL | Status: AC
  Administered 2020-04-22: 40 meq via ORAL
  Filled 2020-04-22: qty 2

## 2020-04-22 MED ORDER — SODIUM CHLORIDE 0.9 % IV SOLN
1.0000 g | Freq: Once | INTRAVENOUS | Status: AC
  Administered 2020-04-22: 1 g via INTRAVENOUS
  Filled 2020-04-22: qty 10

## 2020-04-22 MED ORDER — TIMOLOL MALEATE 0.25 % OP SOLN
1.0000 [drp] | Freq: Two times a day (BID) | OPHTHALMIC | Status: DC
  Administered 2020-04-23 – 2020-04-27 (×9): 1 [drp] via OPHTHALMIC
  Filled 2020-04-22: qty 5

## 2020-04-22 NOTE — H&P (Addendum)
History and Physical    Dana Bradford VHQ:[id number] DOB: [birth year] DOA: 04/22/2020  PCP: Jonathon Resides, MD  Patient coming from: Home.  Chief Complaint: Worsening lower extremity edema.  HPI: Dana Bradford is a 87 y.o. female with history of A. fib, hypertension, diabetes mellitus, bilateral lower extremity edema has been having increasing lower extremity edema over the last couple weeks and patient's Lasix dose increased to double despite which swelling is worsened.  Patient also noticed increasing redness in the lower extremity with warmth concerning for infection over the last 24 hours.  Patient was advised to come to the ER.  ED Course: On exam patient has some redness in the lower extreme more on the right side.  Has 2+ pitting edema.  Chest x-ray shows venous congestion BNP is 908.  EKG shows A. fib rate controlled.  Covid test was negative.  Patient was given Lasix 40 mg IV started on empiric antibiotics for cellulitis admitted for further observation.  Review of Systems: As per HPI, rest all negative.   Past Medical History:  Diagnosis Date  . Acute kidney failure ([place]) 2016 to 2017 due to vacomycin   no kidney issues now  . Anemia   . Anxiety   . Arthritis   . Atrial fibrillation ([place])   . Depression   . Diabetes mellitus without complication ([place])   . Dyspnea   . Food allergy    Fish (Hives)   . Glaucoma   . Hypertension   . Impaired fasting glucose   . Other specified acquired hypothyroidism   . Pneumonia   . Restless leg   . Restless leg   . Skin cancer    Forehead  . 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 Venous stasis ulcer ([place])     Past Surgical History:  Procedure Laterality Date  . ABDOMINAL HYSTERECTOMY  1993   Uterine Prolapse partial  . MASTECTOMY  1974   right breast not sure if cancer 1960's  . REPLACEMENT TOTAL KNEE BILATERAL Bilateral   . SMALL TOE RIGHT FOOT REMOVED  04/2017   DUE TO INFECTION  .  Spleenectomy  2000  . TOTAL HIP ARTHROPLASTY Right 02/28/2018   Procedure: RIGHT TOTAL HIP ARTHROPLASTY ANTERIOR APPROACH;  Surgeon: Gaynelle Arabian, MD;  Location: [facility];  Service: Orthopedics;  Laterality: Right;  179min  . TOTAL HIP ARTHROPLASTY Left 01/22/2020   Procedure: TOTAL HIP ARTHROPLASTY ANTERIOR APPROACH;  Surgeon: Gaynelle Arabian, MD;  Location: [facility] ORS;  Service: Orthopedics;  Laterality: Left;  151min  . TOTAL KNEE ARTHROPLASTY WITH REVISION COMPONENTS       reports that she has quit smoking. Her smoking use included cigarettes. She has a 30.00 pack-year smoking history. She has never used smokeless tobacco. She reports previous alcohol use. She reports that she does not use drugs.  Allergies  Allergen Reactions  . Arthrotec [Diclofenac-Misoprostol] Shortness Of Breath and Diarrhea  . Doxycycline Nausea And Vomiting and Rash    Significant enough to d/c treatment and refuse to take more Vomiting (intolerance) The first time she took it she had N/V and the second she had a rash.    . Latex Rash and Other (See Comments)  . Amlodipine     angioedema  . Celebrex [Celecoxib] Other (See Comments)    GI Bleeding  . Vancomycin     Acute kidney failure  . Diclofenac Diarrhea  . Shellfish Allergy Itching and Rash  Family History  Problem Relation Age of Onset  . Cancer Mother   . Heart disease Father   . Hyperlipidemia Father     Prior to Admission medications   Medication Sig Start Date End Date Taking? Authorizing Provider  acetaminophen (TYLENOL) 500 MG tablet Take 2,000 mg by mouth daily as needed for mild pain or headache.     [provider]  bismuth subsalicylate (PEPTO BISMOL) 262 MG/15ML suspension Take 30 mLs by mouth every 6 (six) hours as needed for indigestion or diarrhea or loose stools.    [provider]  brimonidine (ALPHAGAN) 0.2 % ophthalmic solution Place 1 drop into both eyes 2 (two) times daily.    [provider]   Cholecalciferol (VITAMIN D3) 50 MCG (2000 UT) TABS Take 2,000 Units by mouth daily.    [provider]  diltiazem (TIAZAC) 180 MG 24 hr capsule Take 180 mg by mouth daily. 07/26/19   [provider]  docusate sodium (COLACE) 100 MG capsule Take 1 capsule (100 mg total) by mouth 2 (two) times daily. 01/27/20   Lavina Hamman, MD  ELIQUIS 5 MG TABS tablet Take 5 mg by mouth 2 (two) times daily. 08/02/19   [provider]  famotidine (PEPCID) 40 MG tablet Take 40 mg by mouth at bedtime.  08/20/19   [provider]  ferrous sulfate 325 (65 FE) MG tablet Take 325 mg by mouth 2 (two) times daily.    [provider]  furosemide (LASIX) 20 MG tablet Take 1 tablet (20 mg total) by mouth daily as needed for fluid or edema (weight gain >3lbs in 1 day or >5 lbs in 2 days.). 01/27/20   Lavina Hamman, MD  latanoprost (XALATAN) 0.005 % ophthalmic solution Place 1 drop into both eyes at bedtime.    [provider]  levothyroxine (SYNTHROID, LEVOTHROID) 88 MCG tablet Take 88 mcg by mouth daily before breakfast.    [provider]  metFORMIN (GLUCOPHAGE) 500 MG tablet Take 500 mg by mouth daily.  06/11/19   [provider]  methocarbamol (ROBAXIN) 500 MG tablet Take 1 tablet (500 mg total) by mouth every 6 (six) hours as needed for muscle spasms. 01/24/20   Edmisten, Kristie L, PA  metoprolol succinate (TOPROL-XL) 100 MG 24 hr tablet Take 1 tablet (100 mg total) by mouth 2 (two) times daily. Take with or immediately following a meal. 01/27/20   Lavina Hamman, MD  mirtazapine (REMERON SOL-TAB) 15 MG disintegrating tablet Take 1 tablet (15 mg total) by mouth at bedtime. 01/27/20   Lavina Hamman, MD  omeprazole (PRILOSEC) 40 MG capsule Take 40 mg by mouth daily.    [provider]  ondansetron (ZOFRAN) 4 MG tablet Take 1 tablet (4 mg total) by mouth every 6 (six) hours as needed for nausea. 01/24/20   Edmisten, Ok Anis, PA  Polyethyl  Glycol-Propyl Glycol (SYSTANE) 0.4-0.3 % GEL ophthalmic gel Place 1 application into both eyes 4 (four) times daily as needed (Dry eyes).     [provider]  polyethylene glycol (MIRALAX / GLYCOLAX) 17 g packet Take 17 g by mouth daily. 01/27/20   Lavina Hamman, MD  pramipexole (MIRAPEX) 1 MG tablet Take 1 mg by mouth at bedtime.     [provider]  pregabalin (LYRICA) 25 MG capsule Take 1 capsule (25 mg total) by mouth daily. 01/28/20   Lavina Hamman, MD  simvastatin (ZOCOR) 20 MG tablet Take 20 mg by mouth  every evening.    [provider]  timolol (BETIMOL) 0.25 % ophthalmic solution Place 1 drop into both eyes 2 (two) times daily.    [provider]  traMADol (ULTRAM) 50 MG tablet Take 1-2 tablets (50-100 mg total) by mouth every 6 (six) hours as needed for moderate pain. 01/24/20   Edmisten, Ok Anis, PA  triamcinolone ointment (KENALOG) 0.1 % Apply 1 application topically 2 (two) times daily as needed (skin irritation.).    [provider]  vitamin C (ASCORBIC ACID) 500 MG tablet Take 500 mg by mouth 2 (two) times daily.    [provider]    Physical Exam: Constitutional: Moderately built and nourished. Vitals:   04/22/20 1754 04/22/20 1759 04/22/20 2021 04/22/20 2200  BP: 114/87  112/68 111/71  Pulse: 79  84 89  Resp: 20  20 15   Temp:  97.7 F (36.5 C) (!) 97.5 F (36.4 C)   TempSrc:  Oral Oral   SpO2: 99%  97% 97%   Eyes: Anicteric no pallor. ENMT: No discharge from the ears eyes nose or mouth. Neck: JVD elevated no mass felt. Respiratory: No rhonchi or crepitations. Cardiovascular: S1-S2 heard. Abdomen: Soft nontender bowel sounds present. Musculoskeletal: Bilateral lower extremity edema present. Skin: Bilateral lower extremity erythema more on the right side. Neurologic: Alert awake oriented to time place and person.  Moves all extremities. Psychiatric: Appears normal.  Normal affect.   Labs on Admission: I have  personally reviewed following labs and imaging studies  CBC: Recent Labs  Lab 04/22/20 1756  WBC 9.4  NEUTROABS 6.0  HGB 12.9  HCT 39.1  MCV 94.2  PLT 332   Basic Metabolic Panel: Recent Labs  Lab 04/22/20 1756  NA 136  K 3.1*  CL 95*  CO2 29  GLUCOSE 113*  BUN 59*  CREATININE 1.54*  CALCIUM 9.3   GFR: CrCl cannot be calculated (Unknown ideal weight.). Liver Function Tests: Recent Labs  Lab 04/22/20 1756  AST 24  ALT 24  ALKPHOS 95  BILITOT 0.8  PROT 8.2*  ALBUMIN 3.2*   No results for input(s): LIPASE, AMYLASE in the last 168 hours. No results for input(s): AMMONIA in the last 168 hours. Coagulation Profile: No results for input(s): INR, PROTIME in the last 168 hours. Cardiac Enzymes: No results for input(s): CKTOTAL, CKMB, CKMBINDEX, TROPONINI in the last 168 hours. BNP (last 3 results) No results for input(s): PROBNP in the last 8760 hours. HbA1C: No results for input(s): HGBA1C in the last 72 hours. CBG: No results for input(s): GLUCAP in the last 168 hours. Lipid Profile: No results for input(s): CHOL, HDL, LDLCALC, TRIG, CHOLHDL, LDLDIRECT in the last 72 hours. Thyroid Function Tests: No results for input(s): TSH, T4TOTAL, FREET4, T3FREE, THYROIDAB in the last 72 hours. Anemia Panel: No results for input(s): VITAMINB12, FOLATE, FERRITIN, TIBC, IRON, RETICCTPCT in the last 72 hours. Urine analysis:    Component Value Date/Time   COLORURINE YELLOW 01/19/2020 0150   APPEARANCEUR CLEAR 01/19/2020 0150   LABSPEC 1.016 01/19/2020 0150   PHURINE 5.0 01/19/2020 0150   GLUCOSEU NEGATIVE 01/19/2020 0150   HGBUR MODERATE (A) 01/19/2020 0150   BILIRUBINUR NEGATIVE 01/19/2020 0150   KETONESUR NEGATIVE 01/19/2020 0150   PROTEINUR NEGATIVE 01/19/2020 0150   NITRITE NEGATIVE 01/19/2020 0150   LEUKOCYTESUR NEGATIVE 01/19/2020 0150   Sepsis Labs: @LABRCNTIP (procalcitonin:4,lacticidven:4) )No results found for this or any previous visit (from the past 240  hour(s)).   Radiological Exams on Admission: DG Chest 2 View  Result Date: 04/22/2020 CLINICAL DATA:  Shortness of breath EXAM: CHEST - 2 VIEW COMPARISON:  October 02, 2018 FINDINGS: The heart size and mediastinal contours are within normal limits. Aortic knob calcifications are seen. There is prominence of the central pulmonary vasculature. No large airspace consolidation or effusion. The visualized skeletal structures are unremarkable. IMPRESSION: Mild pulmonary vascular congestion. Electronically Signed   By: Prudencio Pair M.D.   On: 04/22/2020 18:38    EKG: Independently reviewed.  A. fib rate controlled.  Assessment/Plan Principal Problem:   Acute CHF (congestive heart failure) (HCC) Active Problems:   Essential hypertension, benign   AF (paroxysmal atrial fibrillation) (HCC)   Cellulitis of lower extremity   ARF (acute renal failure) (HCC)   CHF (congestive heart failure) ([place])    1. Bilateral worsening lower extremity with elevated BNP likely from decompensated CHF will check 2D echo to assess EF.  We will continue with Lasix 40 mg IV every 12 follow intake output metabolic panel daily will check Dopplers. 2. Lower extremity cellulitis for which patient started on ceftriaxone. 3. A. fib on Eliquis beta-blockers and Cardizem. 4. Acute renal failure with creatinine worsening from 0.8 in December 2021 it is around 1.5.  Patient is on Lasix at this time.  Closely monitor metabolic panel.  Patient appears fluid overloaded on exam. 5. Diabetes mellitus type 2 we will keep patient on sliding scale coverage. 6. Hypothyroidism on Synthroid.  Check TSH.   DVT prophylaxis: Apixaban. Code Status: DNR. Family Communication: Discussed with patient. Disposition Plan: Home. Consults called: None. Admission status: Observation.   Rise Patience MD [facility] Pager [phone].  If 7PM-7AM, please contact night-coverage [email or web address] Password [facility]  04/22/2020, 10:44 PM

## 2020-04-22 NOTE — ED Triage Notes (Signed)
Pt c/o bilateral foot/ankle swelling, redness and warm to the touch. Pt has a wound to right foot as well. Also c/o shortness of breath.

## 2020-04-22 NOTE — ED Provider Notes (Signed)
[facility] EMERGENCY DEPARTMENT Provider Note   CSN: [id number] Arrival date & time: 04/22/20  1737     History Chief Complaint  Patient presents with  . Shortness of Breath  . Wound Infection    Dana Bradford is a 87 y.o. female.  Dana Bradford is a 87 y.o. female with a history of A. fib on chronic anticoagulation, CHF, hypertension, diabetes, venous stasis ulcers, stroke, restless leg, acute renal failure, anxiety and depression, who presents to the ED due to persistent bilateral lower extremity swelling.  Patient has been struggling with few fluid accumulation on her legs now for quite some time, her cardiologist, Dr. Elonda Husky through [place] had doubled her Lasix dose from 20 mg to 40 mg and she was still having significant edema in her legs, he then placed her on twice weekly metolazone, and she has had some improvement in the swelling, although still has quite a bit of fluid on her legs.  She also reports that she has been feeling short of breath during all of this and while it has not gotten significantly worse it also does not seem to be improving.  No oxygen requirement at baseline.  Her daughter reports that she is developed ulcers and constant weeping from her legs.  They have been doing compression wraps but she states that because of all the weeping they become wet extremely quickly.  Over the past few days her lower legs and feet have increasingly red and warm to the touch and are more painful than usual and daughter is concerned she is developing cellulitis, she does have this multiple times previously, and once it led to an infection of her knee replacement.  He had lab work done at her cardiologist office yesterday and noted an increase in her kidney function, and left a voicemail for her today reporting they can trying to continue to manage this as an outpatient versus coming in the hospital for IV medications to help get the fluid off and keep a close eye on her  kidney function.  Cellulitis is well daughter felt this would be the best option.  Patient denies any chest pain.  No lightheadedness or syncope.  No fevers or chills.  No other aggravating or alleviating factors.        Past Medical History:  Diagnosis Date  . Acute kidney failure ([place]) 2016 to 2017 due to vacomycin   no kidney issues now  . Anemia   . Anxiety   . Arthritis   . Atrial fibrillation ([place])   . Depression   . Diabetes mellitus without complication ([place])   . Dyspnea   . Food allergy    Fish ([place])   . Glaucoma   . Hypertension   . Impaired fasting glucose   . Other specified acquired hypothyroidism   . Pneumonia   . Restless leg   . Restless leg   . Skin cancer    Forehead  . Stroke ([place]) 05/2017   ocular stoke on plavix , right eye some vision loss  . Venous stasis ulcer ([place])     Patient Active Problem List   Diagnosis Date Noted  . Dehydration 01/19/2020  . Hypotension 01/19/2020  . Hypothyroidism 01/19/2020  . Hyperlipidemia 01/19/2020  . AF (paroxysmal atrial fibrillation) ([place]) 01/19/2020  . Left leg cellulitis 01/18/2020  . Back pain 08/24/2019  . Generalized weakness 08/24/2019  . OA (osteoarthritis) of hip 02/28/2018  . GERD (gastroesophageal reflux disease) 11/13/2012  . Essential hypertension, benign  06/17/2012  . Impaired fasting glucose 06/17/2012  . Other specified acquired hypothyroidism 06/05/2012  . Restless leg syndrome 06/05/2012    Past Surgical History:  Procedure Laterality Date  . ABDOMINAL HYSTERECTOMY  1993   Uterine Prolapse partial  . MASTECTOMY  1974   right breast not sure if cancer 1960's  . REPLACEMENT TOTAL KNEE BILATERAL Bilateral   . SMALL TOE RIGHT FOOT REMOVED  04/2017   DUE TO INFECTION  . Spleenectomy  2000  . TOTAL HIP ARTHROPLASTY Right 02/28/2018   Procedure: RIGHT TOTAL HIP ARTHROPLASTY ANTERIOR APPROACH;  Surgeon: Gaynelle Arabian, MD;  Location: [facility];  Service: Orthopedics;  Laterality: Right;   133min  . TOTAL HIP ARTHROPLASTY Left 01/22/2020   Procedure: TOTAL HIP ARTHROPLASTY ANTERIOR APPROACH;  Surgeon: Gaynelle Arabian, MD;  Location: [facility];  Service: Orthopedics;  Laterality: Left;  114min  . TOTAL KNEE ARTHROPLASTY WITH REVISION COMPONENTS       OB History   No obstetric history on file.     Family History  Problem Relation Age of Onset  . Cancer Mother   . Heart disease Father   . Hyperlipidemia Father     Social History   Tobacco Use  . Smoking status: Former Smoker    Packs/day: 1.00    Years: 30.00    Pack years: 30.00    Types: Cigarettes  . Smokeless tobacco: Never Used  . Tobacco comment: qiot 50 yrs ago  Vaping Use  . Vaping Use: Never used  Substance Use Topics  . Alcohol use: Not Currently  . Drug use: No    Home Medications Prior to Admission medications   Medication Sig Start Date End Date Taking? Authorizing Provider  acetaminophen (TYLENOL) 500 MG tablet Take 2,000 mg by mouth daily as needed for mild pain or headache.     [provider]  bismuth subsalicylate (PEPTO BISMOL) 262 MG/15ML suspension Take 30 mLs by mouth every 6 (six) hours as needed for indigestion or diarrhea or loose stools.    [provider]  brimonidine (ALPHAGAN) 0.2 % ophthalmic solution Place 1 drop into both eyes 2 (two) times daily.    [provider]  Cholecalciferol (VITAMIN D3) 50 MCG (2000 UT) TABS Take 2,000 Units by mouth daily.    [provider]  diltiazem (TIAZAC) 180 MG 24 hr capsule Take 180 mg by mouth daily. 07/26/19   [provider]  docusate sodium (COLACE) 100 MG capsule Take 1 capsule (100 mg total) by mouth 2 (two) times daily. 01/27/20   Lavina Hamman, MD  ELIQUIS 5 MG TABS tablet Take 5 mg by mouth 2 (two) times daily. 08/02/19   [provider]  famotidine (PEPCID) 40 MG tablet Take 40 mg by mouth at bedtime.  08/20/19   [provider]  ferrous sulfate 325 (65 FE) MG tablet Take 325 mg  by mouth 2 (two) times daily.    [provider]  furosemide (LASIX) 20 MG tablet Take 1 tablet (20 mg total) by mouth daily as needed for fluid or edema (weight gain >3lbs in 1 day or >5 lbs in 2 days.). 01/27/20   Lavina Hamman, MD  latanoprost (XALATAN) 0.005 % ophthalmic solution Place 1 drop into both eyes at bedtime.    [provider]  levothyroxine (SYNTHROID, LEVOTHROID) 88 MCG tablet Take 88 mcg by mouth daily before breakfast.    [provider]  metFORMIN (GLUCOPHAGE) 500 MG tablet Take 500 mg by mouth daily.  06/11/19   [provider]  methocarbamol (ROBAXIN) 500 MG tablet Take 1 tablet (500 mg total) by mouth every 6 (six) hours as needed for muscle spasms. 01/24/20   Edmisten, Kristie L, PA  metoprolol succinate (TOPROL-XL) 100 MG 24 hr tablet Take 1 tablet (100 mg total) by mouth 2 (two) times daily. Take with or immediately following a meal. 01/27/20   Lavina Hamman, MD  mirtazapine (REMERON SOL-TAB) 15 MG disintegrating tablet Take 1 tablet (15 mg total) by mouth at bedtime. 01/27/20   Lavina Hamman, MD  omeprazole (PRILOSEC) 40 MG capsule Take 40 mg by mouth daily.    [provider]  ondansetron (ZOFRAN) 4 MG tablet Take 1 tablet (4 mg total) by mouth every 6 (six) hours as needed for nausea. 01/24/20   Edmisten, Ok Anis, PA  Polyethyl Glycol-Propyl Glycol (SYSTANE) 0.4-0.3 % GEL ophthalmic gel Place 1 application into both eyes 4 (four) times daily as needed (Dry eyes).     [provider]  polyethylene glycol (MIRALAX / GLYCOLAX) 17 g packet Take 17 g by mouth daily. 01/27/20   Lavina Hamman, MD  pramipexole (MIRAPEX) 1 MG tablet Take 1 mg by mouth at bedtime.     [provider]  pregabalin (LYRICA) 25 MG capsule Take 1 capsule (25 mg total) by mouth daily. 01/28/20   Lavina Hamman, MD  simvastatin (ZOCOR) 20 MG tablet Take 20 mg by mouth every evening.    [provider]  timolol (BETIMOL) 0.25 %  ophthalmic solution Place 1 drop into both eyes 2 (two) times daily.    [provider]  traMADol (ULTRAM) 50 MG tablet Take 1-2 tablets (50-100 mg total) by mouth every 6 (six) hours as needed for moderate pain. 01/24/20   Edmisten, Ok Anis, PA  triamcinolone ointment (KENALOG) 0.1 % Apply 1 application topically 2 (two) times daily as needed (skin irritation.).    [provider]  vitamin C (ASCORBIC ACID) 500 MG tablet Take 500 mg by mouth 2 (two) times daily.    [provider]    Allergies    Arthrotec [diclofenac-misoprostol], Doxycycline, Latex, Amlodipine, Celebrex [celecoxib], Vancomycin, Diclofenac, and Shellfish allergy  Review of Systems   Review of Systems  Constitutional: Negative for chills and fever.  HENT: Negative.   Respiratory: Positive for shortness of breath. Negative for cough.   Cardiovascular: Positive for leg swelling. Negative for chest pain and palpitations.  Gastrointestinal: Negative for abdominal pain, nausea and vomiting.  Musculoskeletal: Positive for myalgias.  Skin: Positive for wound.  Neurological: Negative for dizziness, syncope and light-headedness.  All other systems reviewed and are negative.   Physical Exam Updated Vital Signs BP 112/68 (BP Location: Left Arm)   Pulse 84   Temp (!) 97.5 F (36.4 C) (Oral)   Resp 20   SpO2 97%   Physical Exam Vitals and nursing note reviewed.  Constitutional:      General: She is not in acute distress.    Appearance: She is well-developed and well-nourished. She is obese. She is not ill-appearing or diaphoretic.     Comments: Elderly female, alert and pleasant, chronically ill-appearing but in no acute distress  HENT:     Head: Normocephalic and atraumatic.     Mouth/Throat:     Mouth: Oropharynx is clear and moist. Mucous membranes are moist.     Pharynx: Oropharynx is clear.  Eyes:     General:        Right eye:  No discharge.        Left eye: No discharge.      Extraocular Movements: EOM normal.     Pupils: Pupils are equal, round, and reactive to light.  Cardiovascular:     Rate and Rhythm: Normal rate and regular rhythm.     Pulses: Intact distal pulses.     Heart sounds: Normal heart sounds. No murmur heard. No friction rub. No gallop.   Pulmonary:     Effort: Pulmonary effort is normal. No respiratory distress.     Breath sounds: Rales present. No wheezing.     Comments: Breath sounds equal and unlabored, satting well on room air, able to speak in full sentences, on auscultation some faint crackles are noted in the bases, no wheezes or rhonchi. Abdominal:     General: Bowel sounds are normal. There is no distension.     Palpations: Abdomen is soft. There is no mass.     Tenderness: There is no abdominal tenderness. There is no guarding.  Musculoskeletal:        General: No deformity.     Cervical back: Neck supple.     Right lower leg: Tenderness present. Edema present.     Left lower leg: Tenderness present. Edema present.     Comments: Bilateral lower extremities are edematous with 1+ pitting edema up to the tops of the shins with erythema and warmth, multiple areas of ulceration noted, weeping serous fluid, no purulent drainage.  2+ DP pulses.  Skin:    General: Skin is warm and dry.     Capillary Refill: Capillary refill takes less than 2 seconds.  Neurological:     Mental Status: She is alert.     Coordination: Coordination normal.     Comments: Speech is clear, able to follow commands Moves extremities without ataxia, coordination intact  Psychiatric:        Mood and Affect: Mood normal.        Behavior: Behavior normal.     ED Results / Procedures / Treatments   Labs (all labs ordered are listed, but only abnormal results are displayed) Labs Reviewed  COMPREHENSIVE METABOLIC PANEL - Abnormal; Notable for the following components:      Result Value   Potassium 3.1 (*)    Chloride 95 (*)    Glucose, Bld 113 (*)    BUN 59  (*)    Creatinine, Ser 1.54 (*)    Total Protein 8.2 (*)    Albumin 3.2 (*)    GFR, Estimated 32 (*)    All other components within normal limits  CBC WITH DIFFERENTIAL/PLATELET - Abnormal; Notable for the following components:   Monocytes Absolute 1.1 (*)    All other components within normal limits  LACTIC ACID, PLASMA  LACTIC ACID, PLASMA    EKG EKG Interpretation  Date/Time:  Wednesday April 22 2020 17:56:38 EST Ventricular Rate:  87 PR Interval:    QRS Duration: 78 QT Interval:  376 QTC Calculation: 452 R Axis:   103 Text Interpretation: Atrial fibrillation Possible Right ventricular hypertrophy T wave abnormality, consider inferior ischemia T wave abnormality, consider anterior ischemia Abnormal ECG unchanged from previous Confirmed by Charlesetta Shanks [phone]) on 04/22/2020 6:03:05 PM   Radiology DG Chest 2 View  Result Date: 04/22/2020 CLINICAL DATA:  Shortness of breath EXAM: CHEST - 2 VIEW COMPARISON:  October 02, 2018 FINDINGS: The heart size and mediastinal contours are within normal limits. Aortic knob calcifications are seen. There is prominence of the  central pulmonary vasculature. No large airspace consolidation or effusion. The visualized skeletal structures are unremarkable. IMPRESSION: Mild pulmonary vascular congestion. Electronically Signed   By: Prudencio Pair M.D.   On: 04/22/2020 18:38    Procedures Procedures   Medications Ordered in ED Medications  furosemide (LASIX) injection 40 mg (40 mg Intravenous Given 04/23/20 0526)  cefTRIAXone (ROCEPHIN) 1 g in sodium chloride 0.9 % 100 mL IVPB (has no administration in time range)  cefTRIAXone (ROCEPHIN) 1 g in sodium chloride 0.9 % 100 mL IVPB (0 g Intravenous Stopped 04/22/20 2244)  potassium chloride SA (KLOR-CON) CR tablet 40 mEq (40 mEq Oral Given 04/22/20 2201)  furosemide (LASIX) injection 40 mg (40 mg Intravenous Given 04/22/20 2202)  HYDROcodone-acetaminophen (NORCO/VICODIN) 5-325 MG per tablet 1 tablet (1 tablet  Oral Given 04/22/20 2219)    ED Course  I have reviewed the triage vital signs and the nursing notes.  Pertinent labs & imaging results that were available during my care of the patient were reviewed by me and considered in my medical decision making (see chart for details).    MDM Rules/Calculators/A&P                         87 year old female presents with persistent lower extremity swelling as well as some shortness of breath despite increasing her Lasix and adding metolazone with her cardiologist.  She has had significant weeping of serous fluid from both legs and has developed ulcerations, recently legs have become very erythematous and warm to the touch, history of prior cellulitis infections.  No fevers.Patient saw her cardiologist yesterday, they did lab work that showed a worsening in her kidney function w, and talked about continuing to work on this as an outpatient versus coming into the hospital.  Due to worsening cellulitis noted in the legs.  She elected to come to the hospital today.  On arrival she has normal vitals and is in no acute distress.  Concern for continued fluid overload in the setting of CHF not resolving with diuretics at home, now with worsening kidney function.  Will likely need inpatient admission for continued diuresis we'll also start patient on IV Rocephin for lower extremity cellulitis, would benefit from wound care consult while hospitalized.  I have independently ordered, reviewed and interpreted all labs and imaging: EKG shows A. fib with no other concerning changes, rate controlled and on anticoagulation.  CBC, no leukocytosis, normal hemoglobin CMP: Potassium of 3.1, creatinine increased from previous 0.8 to 1.54 today with elevated BUN of 69, no other significant electrolyte derangements and normal liver function. Lactic acid not elevated at 1.6 BNP elevated at 908  Chest x-ray shows mild pulmonary vascular congestion.  Despite elevated creatinine still  feel patient would benefit from further diuresis, will give 40 of Lasix as well as p.o. potassium replacement and start patient on Rocephin for cellulitis.  Will consult for medicine admission.  Case discussed with Dr. Hal Hope with [facility] hospitalist who will see and admit the patient.  Final Clinical Impression(s) / ED Diagnoses Final diagnoses:  Lower extremity edema  Cellulitis, unspecified cellulitis site  Acute on chronic congestive heart failure, unspecified heart failure type [facility])    Rx / DC Orders ED Discharge Orders    None       Janet Berlin 04/23/20 1419    Carmin Muskrat, MD 04/26/20 0002

## 2020-04-23 ENCOUNTER — Observation Stay (HOSPITAL_COMMUNITY): Payer: Medicare Other

## 2020-04-23 DIAGNOSIS — I5040 Unspecified combined systolic (congestive) and diastolic (congestive) heart failure: Secondary | ICD-10-CM | POA: Diagnosis not present

## 2020-04-23 DIAGNOSIS — E119 Type 2 diabetes mellitus without complications: Secondary | ICD-10-CM | POA: Diagnosis present

## 2020-04-23 DIAGNOSIS — Z7984 Long term (current) use of oral hypoglycemic drugs: Secondary | ICD-10-CM | POA: Diagnosis not present

## 2020-04-23 DIAGNOSIS — R609 Edema, unspecified: Secondary | ICD-10-CM

## 2020-04-23 DIAGNOSIS — E039 Hypothyroidism, unspecified: Secondary | ICD-10-CM | POA: Diagnosis present

## 2020-04-23 DIAGNOSIS — Z90710 Acquired absence of both cervix and uterus: Secondary | ICD-10-CM | POA: Diagnosis not present

## 2020-04-23 DIAGNOSIS — I509 Heart failure, unspecified: Secondary | ICD-10-CM

## 2020-04-23 DIAGNOSIS — L03119 Cellulitis of unspecified part of limb: Secondary | ICD-10-CM | POA: Diagnosis not present

## 2020-04-23 DIAGNOSIS — Z79899 Other long term (current) drug therapy: Secondary | ICD-10-CM | POA: Diagnosis not present

## 2020-04-23 DIAGNOSIS — I5033 Acute on chronic diastolic (congestive) heart failure: Secondary | ICD-10-CM | POA: Diagnosis present

## 2020-04-23 DIAGNOSIS — Z96653 Presence of artificial knee joint, bilateral: Secondary | ICD-10-CM | POA: Diagnosis present

## 2020-04-23 DIAGNOSIS — I480 Paroxysmal atrial fibrillation: Secondary | ICD-10-CM | POA: Diagnosis not present

## 2020-04-23 DIAGNOSIS — I110 Hypertensive heart disease with heart failure: Secondary | ICD-10-CM | POA: Diagnosis present

## 2020-04-23 DIAGNOSIS — Z9011 Acquired absence of right breast and nipple: Secondary | ICD-10-CM | POA: Diagnosis not present

## 2020-04-23 DIAGNOSIS — N179 Acute kidney failure, unspecified: Secondary | ICD-10-CM | POA: Diagnosis not present

## 2020-04-23 DIAGNOSIS — Z20822 Contact with and (suspected) exposure to covid-19: Secondary | ICD-10-CM | POA: Diagnosis present

## 2020-04-23 DIAGNOSIS — Z87891 Personal history of nicotine dependence: Secondary | ICD-10-CM | POA: Diagnosis not present

## 2020-04-23 DIAGNOSIS — E871 Hypo-osmolality and hyponatremia: Secondary | ICD-10-CM | POA: Diagnosis present

## 2020-04-23 DIAGNOSIS — Z66 Do not resuscitate: Secondary | ICD-10-CM | POA: Diagnosis present

## 2020-04-23 DIAGNOSIS — L03115 Cellulitis of right lower limb: Secondary | ICD-10-CM | POA: Diagnosis present

## 2020-04-23 DIAGNOSIS — Z85828 Personal history of other malignant neoplasm of skin: Secondary | ICD-10-CM | POA: Diagnosis not present

## 2020-04-23 DIAGNOSIS — G2581 Restless legs syndrome: Secondary | ICD-10-CM | POA: Diagnosis present

## 2020-04-23 DIAGNOSIS — E876 Hypokalemia: Secondary | ICD-10-CM | POA: Diagnosis present

## 2020-04-23 DIAGNOSIS — Z7901 Long term (current) use of anticoagulants: Secondary | ICD-10-CM | POA: Diagnosis not present

## 2020-04-23 DIAGNOSIS — Z91013 Allergy to seafood: Secondary | ICD-10-CM | POA: Diagnosis not present

## 2020-04-23 DIAGNOSIS — Z8673 Personal history of transient ischemic attack (TIA), and cerebral infarction without residual deficits: Secondary | ICD-10-CM | POA: Diagnosis not present

## 2020-04-23 DIAGNOSIS — Z79890 Hormone replacement therapy (postmenopausal): Secondary | ICD-10-CM | POA: Diagnosis not present

## 2020-04-23 DIAGNOSIS — Z9081 Acquired absence of spleen: Secondary | ICD-10-CM | POA: Diagnosis not present

## 2020-04-23 DIAGNOSIS — I10 Essential (primary) hypertension: Secondary | ICD-10-CM

## 2020-04-23 DIAGNOSIS — I482 Chronic atrial fibrillation, unspecified: Secondary | ICD-10-CM | POA: Diagnosis present

## 2020-04-23 HISTORY — DX: Heart failure, unspecified: I50.9

## 2020-04-23 LAB — BASIC METABOLIC PANEL
Anion gap: 13 (ref 5–15)
BUN: 48 mg/dL — ABNORMAL HIGH (ref 8–23)
CO2: 31 mmol/L (ref 22–32)
Calcium: 9.4 mg/dL (ref 8.9–10.3)
Chloride: 92 mmol/L — ABNORMAL LOW (ref 98–111)
Creatinine, Ser: 1.09 mg/dL — ABNORMAL HIGH (ref 0.44–1.00)
GFR, Estimated: 49 mL/min — ABNORMAL LOW (ref >60)
Glucose, Bld: 125 mg/dL — ABNORMAL HIGH (ref 70–99)
Potassium: 3.2 mmol/L — ABNORMAL LOW (ref 3.5–5.1)
Sodium: 136 mmol/L (ref 135–145)

## 2020-04-23 LAB — TROPONIN I (HIGH SENSITIVITY): Troponin I (High Sensitivity): 8 ng/L (ref <18)

## 2020-04-23 LAB — ECHOCARDIOGRAM COMPLETE
Area-P 1/2: 4.21 cm2
Calc EF: 65.9 %
S' Lateral: 1.9 cm
Single Plane A2C EF: 73.7 %
Single Plane A4C EF: 60.8 %

## 2020-04-23 LAB — MAGNESIUM: Magnesium: 1.9 mg/dL (ref 1.7–2.4)

## 2020-04-23 LAB — GLUCOSE, CAPILLARY
Glucose-Capillary: 110 mg/dL — ABNORMAL HIGH (ref 70–99)
Glucose-Capillary: 154 mg/dL — ABNORMAL HIGH (ref 70–99)

## 2020-04-23 LAB — SARS CORONAVIRUS 2 (TAT 6-24 HRS): SARS Coronavirus 2: NEGATIVE

## 2020-04-23 LAB — CBG MONITORING, ED
Glucose-Capillary: 122 mg/dL — ABNORMAL HIGH (ref 70–99)
Glucose-Capillary: 118 mg/dL — ABNORMAL HIGH (ref 70–99)
Glucose-Capillary: 100 mg/dL — ABNORMAL HIGH (ref 70–99)

## 2020-04-23 LAB — TSH: TSH: 0.556 u[IU]/mL (ref 0.350–4.500)

## 2020-04-23 MED ORDER — SODIUM CHLORIDE 0.9 % IV SOLN
INTRAVENOUS | Status: DC | PRN
  Administered 2020-04-23 – 2020-04-24 (×2): 250 mL via INTRAVENOUS

## 2020-04-23 NOTE — Progress Notes (Signed)
PROGRESS NOTE    Dana Bradford  GDJ:[id number] DOB: [birth year] DOA: 04/22/2020 PCP: Jonathon Resides, MD   Brief Narrative:  HPI on 04/22/2020 by Dr. Gean Birchwood Dana Bradford is a 87 y.o. female with history of A. fib, hypertension, diabetes mellitus, bilateral lower extremity edema has been having increasing lower extremity edema over the last couple weeks and patient's Lasix dose increased to double despite which swelling is worsened.  Patient also noticed increasing redness in the lower extremity with warmth concerning for infection over the last 24 hours.  Patient was advised to come to the ER.  Interim history Patient met with CHF exacerbation as well as right lower extremity cellulitis.  Currently on IV Lasix as well as IV ceftriaxone. Assessment & Plan   Acute CHF exacerbation, unknown type -Patient presented with lower extremity edema -Chest x-ray showed venous congestion.  BNP 908 -Patient had echocardiogram at outside facility back in February 2021 which showed an EF of 55 to 62%, diastolic parameters were indeterminate. -Pending echocardiogram -Home medications of Lasix and Zaroxolyn currently held -Continue Lasix 40 mg IV twice daily -Monitor intake and output, daily weights  Right lower extremity cellulitis -Patient with edema and marked erythema, along with warmth -Placed on IV ceftriaxone -Lower extremity Doppler was unremarkable for DVT.  Although ultrasound did show enlarged lymph nodes in the right groin.  Atrial fibrillation, chronic -Continue Cardizem, Eliquis  Acute kidney injury -Creatinine on admission was 1.54 (baseline appears to be less than 1, was 0.89 back in December 2021) -Creatinine down to 1.09 today -Will continue to monitor closely given use of IV Lasix  Diabetes mellitus, type II -Continue insulin sliding scale, CBG monitoring -Metformin currently held  Hypothyroidism -TSH pending, continue Synthroid  Hypokalemia -potassium 3.2 -will  supplement and continue to monitor  DVT Prophylaxis Eliquis  Code Status: DNR  Family Communication: None at bedside  Disposition Plan:  Status is: Observation  The patient will require care spanning > 2 midnights and should be moved to inpatient because: IV treatments appropriate due to intensity of illness or inability to take PO  Dispo: The patient is from: Home              Anticipated d/c is to: Home              Patient currently is not medically stable to d/c.   Difficult to place patient Bradford   Consultants None  Procedures  Lower extremity Doppler  Antibiotics   Anti-infectives (From admission, onward)   Start     Dose/Rate Route Frequency Ordered Stop   04/23/20 2200  cefTRIAXone (ROCEPHIN) 1 g in sodium chloride 0.9 % 100 mL IVPB        1 g 200 mL/hr over 30 Minutes Intravenous Daily at bedtime 04/22/20 2245     04/22/20 2145  cefTRIAXone (ROCEPHIN) 1 g in sodium chloride 0.9 % 100 mL IVPB        1 g 200 mL/hr over 30 Minutes Intravenous  Once 04/22/20 2131 04/22/20 2244      Subjective:   Dana Bradford seen and examined today.  Patient continues to complain of feeling fatigued as well as swelling in her lower extremities.  Denies current chest pain, shortness of breath, abdominal pain, nausea or vomiting, diarrhea constipation, dizziness or headache.  Objective:   Vitals:   04/23/20 0800 04/23/20 1012 04/23/20 1115 04/23/20 1200  BP: 109/82 (!) 128/118 (!) 105/58 90/61  Pulse: 91 78 68 80  Resp: 11  20 14  Temp:      TempSrc:      SpO2: 97%  95% 92%    Intake/Output Summary (Last 24 hours) at 04/23/2020 1415 Last data filed at 04/23/2020 0532 Gross per 24 hour  Intake --  Output 200 ml  Net -200 ml   There were Bradford vitals filed for this visit.  Exam  General: Well developed, chronically ill-appearing, NAD  HEENT: NCAT, mucous membranes moist.   Cardiovascular: S1 S2 auscultated, irregular  Respiratory: Diminished breath sounds,  anteriorly  Abdomen: Soft, nontender, nondistended, + bowel sounds  Extremities: warm dry without cyanosis clubbing.  LE edema R > L  Neuro: AAOx3, nonfocal  Skin: Without rashes exudates or nodules, mild erythema of the right lower extremity  Psych: pleasant, appropriate mood and affect   Data Reviewed: I have personally reviewed following labs and imaging studies  CBC: Recent Labs  Lab 04/22/20 1756  WBC 9.4  NEUTROABS 6.0  HGB 12.9  HCT 39.1  MCV 94.2  PLT 272   Basic Metabolic Panel: Recent Labs  Lab 04/22/20 1756 04/23/20 0648  NA 136 136  K 3.1* 3.2*  CL 95* 92*  CO2 29 31  GLUCOSE 113* 125*  BUN 59* 48*  CREATININE 1.54* 1.09*  CALCIUM 9.3 9.4  MG  --  1.9   GFR: CrCl cannot be calculated (Unknown ideal weight.). Liver Function Tests: Recent Labs  Lab 04/22/20 1756  AST 24  ALT 24  ALKPHOS 95  BILITOT 0.8  PROT 8.2*  ALBUMIN 3.2*   Bradford results for input(s): LIPASE, AMYLASE in the last 168 hours. Bradford results for input(s): AMMONIA in the last 168 hours. Coagulation Profile: Bradford results for input(s): INR, PROTIME in the last 168 hours. Cardiac Enzymes: Bradford results for input(s): CKTOTAL, CKMB, CKMBINDEX, TROPONINI in the last 168 hours. BNP (last 3 results) Bradford results for input(s): PROBNP in the last 8760 hours. HbA1C: Bradford results for input(s): HGBA1C in the last 72 hours. CBG: Recent Labs  Lab 04/23/20 0754 04/23/20 1217  GLUCAP 122* 118*   Lipid Profile: Bradford results for input(s): CHOL, HDL, LDLCALC, TRIG, CHOLHDL, LDLDIRECT in the last 72 hours. Thyroid Function Tests: Bradford results for input(s): TSH, T4TOTAL, FREET4, T3FREE, THYROIDAB in the last 72 hours. Anemia Panel: Bradford results for input(s): VITAMINB12, FOLATE, FERRITIN, TIBC, IRON, RETICCTPCT in the last 72 hours. Urine analysis:    Component Value Date/Time   COLORURINE YELLOW 01/19/2020 0150   APPEARANCEUR CLEAR 01/19/2020 0150   LABSPEC 1.016 01/19/2020 0150   PHURINE 5.0  01/19/2020 0150   GLUCOSEU NEGATIVE 01/19/2020 0150   HGBUR MODERATE (A) 01/19/2020 0150   BILIRUBINUR NEGATIVE 01/19/2020 0150   KETONESUR NEGATIVE 01/19/2020 0150   PROTEINUR NEGATIVE 01/19/2020 0150   NITRITE NEGATIVE 01/19/2020 0150   LEUKOCYTESUR NEGATIVE 01/19/2020 0150   Sepsis Labs: _0 (procalcitonin:4,lacticidven:4)  ) Recent Results (from the past 240 hour(s))  SARS CORONAVIRUS 2 (TAT 6-24 HRS) Nasopharyngeal Nasopharyngeal Swab     Status: None   Collection Time: 04/22/20 10:13 PM   Specimen: Nasopharyngeal Swab  Result Value Ref Range Status   SARS Coronavirus 2 NEGATIVE NEGATIVE Final    Comment: (NOTE) SARS-CoV-2 target nucleic acids are NOT DETECTED.  The SARS-CoV-2 RNA is generally detectable in upper and lower respiratory specimens during the acute phase of infection. Negative results do not preclude SARS-CoV-2 infection, do not rule out co-infections with other pathogens, and should not be used as the sole basis for treatment or other patient management decisions.  Negative results must be combined with clinical observations, patient history, and epidemiological information. The expected result is Negative.  Fact Sheet for Patients: SugarRoll.be  Fact Sheet for Healthcare Providers: https://www.woods-mathews.com/  This test is not yet approved or cleared by the [place] FDA and  has been authorized for detection and/or diagnosis of SARS-CoV-2 by FDA under an Emergency Use Authorization (EUA). This EUA will remain  in effect (meaning this test can be used) for the duration of the COVID-19 declaration under Se ction 564(b)(1) of the Act, 21 U.S.C. section 360bbb-3(b)(1), unless the authorization is terminated or revoked sooner.  Performed at [facility], [place] [address] [place], [place] [address]       Radiology Studies: DG Chest 2 View  Result Date: 04/22/2020 CLINICAL DATA:  Shortness of  breath EXAM: CHEST - 2 VIEW COMPARISON:  October 02, 2018 FINDINGS: The heart size and mediastinal contours are within normal limits. Aortic knob calcifications are seen. There is prominence of the central pulmonary vasculature. Bradford large airspace consolidation or effusion. The visualized skeletal structures are unremarkable. IMPRESSION: Mild pulmonary vascular congestion. Electronically Signed   By: Prudencio Pair M.D.   On: 04/22/2020 18:38   VAS [place] LOWER EXTREMITY VENOUS (DVT)  Result Date: 04/23/2020  Lower Venous DVT Study Indications: Increased bilateral edema.  Anticoagulation: Eliquis. Comparison       01-19-2020 Bilateral lower extremity venous was negative for Study:           DVT. Performing Technologist: Darlin Coco RDMS, RVT  Examination Guidelines: A complete evaluation includes B-mode imaging, spectral Doppler, color Doppler, and power Doppler as needed of all accessible portions of each vessel. Bilateral testing is considered an integral part of a complete examination. Limited examinations for reoccurring indications may be performed as noted. The reflux portion of the exam is performed with the patient in reverse Trendelenburg.  +---------+---------------+---------+-----------+----------+--------------+ RIGHT    CompressibilityPhasicitySpontaneityPropertiesThrombus Aging +---------+---------------+---------+-----------+----------+--------------+ CFV      Full           Yes      Yes                                 +---------+---------------+---------+-----------+----------+--------------+ SFJ      Full                                                        +---------+---------------+---------+-----------+----------+--------------+ FV Prox  Full                                                        +---------+---------------+---------+-----------+----------+--------------+ FV Mid   Full                                                         +---------+---------------+---------+-----------+----------+--------------+ FV DistalFull                                                        +---------+---------------+---------+-----------+----------+--------------+  PFV      Full                                                        +---------+---------------+---------+-----------+----------+--------------+ POP      Full           Yes      Yes                                 +---------+---------------+---------+-----------+----------+--------------+ PTV      Full                                                        +---------+---------------+---------+-----------+----------+--------------+ PERO     Full                                                        +---------+---------------+---------+-----------+----------+--------------+   +---------+---------------+---------+-----------+----------+--------------+ LEFT     CompressibilityPhasicitySpontaneityPropertiesThrombus Aging +---------+---------------+---------+-----------+----------+--------------+ CFV      Full           Yes      Yes                                 +---------+---------------+---------+-----------+----------+--------------+ SFJ      Full                                                        +---------+---------------+---------+-----------+----------+--------------+ FV Prox  Full                                                        +---------+---------------+---------+-----------+----------+--------------+ FV Mid   Full                                                        +---------+---------------+---------+-----------+----------+--------------+ FV DistalFull                                                        +---------+---------------+---------+-----------+----------+--------------+ PFV      Full                                                         +---------+---------------+---------+-----------+----------+--------------+  POP      Full           Yes      Yes                                 +---------+---------------+---------+-----------+----------+--------------+ PTV      Full                                                        +---------+---------------+---------+-----------+----------+--------------+ PERO     Full                                                        +---------+---------------+---------+-----------+----------+--------------+     Summary: RIGHT: - There is Bradford evidence of deep vein thrombosis in the lower extremity.  - Bradford cystic structure found in the popliteal fossa. - Ultrasound characteristics of enlarged lymph nodes are noted in the groin.  LEFT: - There is Bradford evidence of deep vein thrombosis in the lower extremity.  - Bradford cystic structure found in the popliteal fossa.  *See table(s) above for measurements and observations.    Preliminary      Scheduled Meds: . apixaban  5 mg Oral BID  . brimonidine  1 drop Both Eyes BID  . diltiazem  180 mg Oral Daily  . ferrous sulfate  325 mg Oral BID  . furosemide  40 mg Intravenous Q12H  . insulin aspart  0-9 Units Subcutaneous TID WC  . latanoprost  1 drop Both Eyes QHS  . levothyroxine  88 mcg Oral QAC breakfast  . metoprolol succinate  100 mg Oral BID  . mirtazapine  15 mg Oral QHS  . pantoprazole  40 mg Oral Daily  . pramipexole  1 mg Oral QHS  . pregabalin  25 mg Oral Daily  . simvastatin  20 mg Oral QPM  . timolol  1 drop Both Eyes BID   Continuous Infusions: . cefTRIAXone (ROCEPHIN)  IV       LOS: 0 days   Time Spent in minutes   45 minutes  Shanty Ginty D.O. on 04/23/2020 at 2:15 PM  Between 7am to 7pm - Please see pager noted on [email or web address]  After 7pm go to [email or web address]  And look for the night coverage person covering for me after hours  [facility] Office  [phone]

## 2020-04-23 NOTE — Progress Notes (Signed)
  Echocardiogram 2D Echocardiogram has been performed.  Dana Bradford 04/23/2020, 4:48 PM

## 2020-04-23 NOTE — Progress Notes (Signed)
Report called to Vicente Males, RN on 3E.

## 2020-04-23 NOTE — Progress Notes (Signed)
Lower extremity venous bilateral study completed.   Please see CV Proc for preliminary results.   Jenesis Suchy, RDMS, RVT  

## 2020-04-23 NOTE — ED Notes (Signed)
Medications still not verified by pharmacy(eye drops)

## 2020-04-23 NOTE — Plan of Care (Signed)

## 2020-04-23 NOTE — Plan of Care (Signed)

## 2020-04-24 DIAGNOSIS — I509 Heart failure, unspecified: Secondary | ICD-10-CM | POA: Diagnosis not present

## 2020-04-24 DIAGNOSIS — I480 Paroxysmal atrial fibrillation: Secondary | ICD-10-CM | POA: Diagnosis not present

## 2020-04-24 DIAGNOSIS — I5033 Acute on chronic diastolic (congestive) heart failure: Secondary | ICD-10-CM | POA: Diagnosis not present

## 2020-04-24 DIAGNOSIS — L03119 Cellulitis of unspecified part of limb: Secondary | ICD-10-CM | POA: Diagnosis not present

## 2020-04-24 LAB — BASIC METABOLIC PANEL
Anion gap: 12 (ref 5–15)
BUN: 47 mg/dL — ABNORMAL HIGH (ref 8–23)
CO2: 29 mmol/L (ref 22–32)
Calcium: 9.2 mg/dL (ref 8.9–10.3)
Chloride: 92 mmol/L — ABNORMAL LOW (ref 98–111)
Creatinine, Ser: 1.17 mg/dL — ABNORMAL HIGH (ref 0.44–1.00)
GFR, Estimated: 45 mL/min — ABNORMAL LOW (ref >60)
Glucose, Bld: 130 mg/dL — ABNORMAL HIGH (ref 70–99)
Potassium: 3.8 mmol/L (ref 3.5–5.1)
Sodium: 133 mmol/L — ABNORMAL LOW (ref 135–145)

## 2020-04-24 LAB — GLUCOSE, CAPILLARY
Glucose-Capillary: 120 mg/dL — ABNORMAL HIGH (ref 70–99)
Glucose-Capillary: 181 mg/dL — ABNORMAL HIGH (ref 70–99)
Glucose-Capillary: 72 mg/dL (ref 70–99)
Glucose-Capillary: 112 mg/dL — ABNORMAL HIGH (ref 70–99)

## 2020-04-24 NOTE — Discharge Instructions (Signed)

## 2020-04-24 NOTE — Plan of Care (Signed)
  Problem: Education: Goal: Knowledge of General Education information will improve Description: Including pain rating scale, medication(s)/side effects and non-pharmacologic comfort measures Outcome: Progressing   Problem: Activity: Goal: Risk for activity intolerance will decrease Outcome: Progressing   Problem: Pain Managment: Goal: General experience of comfort will improve Outcome: Progressing   

## 2020-04-24 NOTE — Progress Notes (Signed)
Patient had no urine output, RN did bladder scan (396cc), helped patient to bathroom and she ended up urinating. Complained of feet hurting and RN gave PRN tylenol, patient then states she may need oxy if the tylenol doesn't work.

## 2020-04-24 NOTE — Progress Notes (Signed)
PROGRESS NOTE    [place]  DUK:[id number] DOB: 26-Apr-1930 DOA: 04/22/2020 PCP: Jonathon Resides, MD   Brief Narrative:  HPI on 04/22/2020 by Dr. Gean Birchwood [name] is a 87 y.o. female with history of A. fib, hypertension, diabetes mellitus, bilateral lower extremity edema has been having increasing lower extremity edema over the last couple weeks and patient's Lasix dose increased to double despite which swelling is worsened.  Patient also noticed increasing redness in the lower extremity with warmth concerning for infection over the last 24 hours.  Patient was advised to come to the ER.  Interim history Patient met with CHF exacerbation as well as right lower extremity cellulitis.  Currently on IV Lasix as well as IV ceftriaxone. Assessment & Plan   Acute CHF exacerbation, suspect diastolic  -Patient presented with lower extremity edema -Chest x-ray showed venous congestion.  BNP 908 -Patient had echocardiogram at outside facility back in February 2021 which showed an EF of 55 to 37%, diastolic parameters were indeterminate. -Echocardiogram EF 55 to 60%, no regional wall motion abnormalities, mild concentric LV H.  LV diastolic parameters are indeterminate.  RV systolic function mildly reduced, RV is mildly enlarged.  Bilateral atria mildly dilated. -Home medications of Lasix and Zaroxolyn currently held -Continue Lasix 40 mg IV twice daily -Monitor intake and output, daily weights  Right lower extremity cellulitis -Patient with edema and marked erythema, along with warmth -Placed on IV ceftriaxone -Lower extremity Doppler was unremarkable for DVT.  Although ultrasound did show enlarged lymph nodes in the right groin.  Atrial fibrillation, chronic -Continue Cardizem, Eliquis  Acute kidney injury -Creatinine on admission was 1.54 (baseline appears to be less than 1, was 0.89 back in December 2021) -Creatinine down to 1.17 today -Will continue to monitor closely given  use of IV Lasix  Diabetes mellitus, type II -Continue insulin sliding scale, CBG monitoring -Metformin currently held  Hypothyroidism -TSH 0.556, continue Synthroid  Hypokalemia -Resolved with repletion, continue to monitor BMP  DVT Prophylaxis Eliquis  Code Status: DNR  Family Communication: None at bedside  Disposition Plan:  Status is: Inpatient  Remains inpatient appropriate because:Hemodynamically unstable and IV treatments appropriate due to intensity of illness or inability to take PO   Dispo: The patient is from: Home              Anticipated d/c is to: Home              Patient currently is not medically stable to d/c.   Difficult to place patient No   Consultants None  Procedures  Lower extremity Doppler Echocardiogram  Antibiotics   Anti-infectives (From admission, onward)   Start     Dose/Rate Route Frequency Ordered Stop   04/23/20 2200  cefTRIAXone (ROCEPHIN) 1 g in sodium chloride 0.9 % 100 mL IVPB        1 g 200 mL/hr over 30 Minutes Intravenous Daily at bedtime 04/22/20 2245     04/22/20 2145  cefTRIAXone (ROCEPHIN) 1 g in sodium chloride 0.9 % 100 mL IVPB        1 g 200 mL/hr over 30 Minutes Intravenous  Once 04/22/20 2131 04/22/20 2244      Subjective:   [place] Bradford seen and examined today.  Feels very sleepy this morning.  Continues to have some swelling and pain in her lower extremities.  Denies current chest pain, shortness of breath, abdominal pain, nausea or vomiting, diarrhea or constipation, dizziness or headache.  Objective:   Vitals:  04/23/20 2354 04/24/20 0344 04/24/20 0939 04/24/20 1142  BP: 101/68 106/65 112/63 96/60  Pulse: 63  67 92  Resp: _0 Temp: 97.6 F (36.4 C) 97.7 F (36.5 C) 97.9 F (36.6 C) 98 F (36.7 C)  TempSrc: Oral Oral Oral   SpO2: 91% 94% 95% 98%  Weight:  63.4 kg    Height:        Intake/Output Summary (Last 24 hours) at 04/24/2020 1254 Last data filed at 04/24/2020 0422 Gross per 24  hour  Intake 358.13 ml  Output 1250 ml  Net -891.87 ml   Filed Weights   04/23/20 1824 04/24/20 0344  Weight: 62.6 kg 63.4 kg    Exam  General: Well developed, chronically ill-appearing, NAD  HEENT: NCAT, mucous membranes moist.   Cardiovascular: S1 S2 auscultated, irregular  Respiratory: Diminished breath sounds, anteriorly  Abdomen: Soft, nontender, nondistended, + bowel sounds  Extremities: warm dry without cyanosis clubbing.  LE edema bilaterally  Neuro: AAOx3, nonfocal  Skin: Mild erythema on lower extremities  Psych: appropriate mood and affect   Data Reviewed: I have personally reviewed following labs and imaging studies  CBC: Recent Labs  Lab 04/22/20 1756  WBC 9.4  NEUTROABS 6.0  HGB 12.9  HCT 39.1  MCV 94.2  PLT 355   Basic Metabolic Panel: Recent Labs  Lab 04/22/20 1756 04/23/20 0648 04/24/20 0459  NA 136 136 133*  K 3.1* 3.2* 3.8  CL 95* 92* 92*  CO2 _1 GLUCOSE 113* 125* 130*  BUN 59* 48* 47*  CREATININE 1.54* 1.09* 1.17*  CALCIUM 9.3 9.4 9.2  MG  --  1.9  --    GFR: Estimated Creatinine Clearance: 26.7 mL/min (A) (by C-G formula based on SCr of 1.17 mg/dL (H)). Liver Function Tests: Recent Labs  Lab 04/22/20 1756  AST 24  ALT 24  ALKPHOS 95  BILITOT 0.8  PROT 8.2*  ALBUMIN 3.2*   No results for input(s): LIPASE, AMYLASE in the last 168 hours. No results for input(s): AMMONIA in the last 168 hours. Coagulation Profile: No results for input(s): INR, PROTIME in the last 168 hours. Cardiac Enzymes: No results for input(s): CKTOTAL, CKMB, CKMBINDEX, TROPONINI in the last 168 hours. BNP (last 3 results) No results for input(s): PROBNP in the last 8760 hours. HbA1C: No results for input(s): HGBA1C in the last 72 hours. CBG: Recent Labs  Lab 04/23/20 1719 04/23/20 1838 04/23/20 2133 04/24/20 0609 04/24/20 1144  GLUCAP 100* 110* 154* 120* 181*   Lipid Profile: No results for input(s): CHOL, HDL, LDLCALC, TRIG,  CHOLHDL, LDLDIRECT in the last 72 hours. Thyroid Function Tests: Recent Labs    04/23/20 1851  TSH 0.556   Anemia Panel: No results for input(s): VITAMINB12, FOLATE, FERRITIN, TIBC, IRON, RETICCTPCT in the last 72 hours. Urine analysis:    Component Value Date/Time   COLORURINE YELLOW 01/19/2020 0150   APPEARANCEUR CLEAR 01/19/2020 0150   LABSPEC 1.016 01/19/2020 0150   PHURINE 5.0 01/19/2020 0150   GLUCOSEU NEGATIVE 01/19/2020 0150   HGBUR MODERATE (A) 01/19/2020 0150   BILIRUBINUR NEGATIVE 01/19/2020 0150   KETONESUR NEGATIVE 01/19/2020 0150   PROTEINUR NEGATIVE 01/19/2020 0150   NITRITE NEGATIVE 01/19/2020 0150   LEUKOCYTESUR NEGATIVE 01/19/2020 0150   Sepsis Labs: _2 (procalcitonin:4,lacticidven:4)  ) Recent Results (from the past 240 hour(s))  SARS CORONAVIRUS 2 (TAT 6-24 HRS) Nasopharyngeal Nasopharyngeal Swab     Status: None   Collection Time: 04/22/20 10:13 PM   Specimen: Nasopharyngeal  Swab  Result Value Ref Range Status   SARS Coronavirus 2 NEGATIVE NEGATIVE Final    Comment: (NOTE) SARS-CoV-2 target nucleic acids are NOT DETECTED.  The SARS-CoV-2 RNA is generally detectable in upper and lower respiratory specimens during the acute phase of infection. Negative results do not preclude SARS-CoV-2 infection, do not rule out co-infections with other pathogens, and should not be used as the sole basis for treatment or other patient management decisions. Negative results must be combined with clinical observations, patient history, and epidemiological information. The expected result is Negative.  Fact Sheet for Patients: SugarRoll.be  Fact Sheet for Healthcare Providers: https://www.woods-mathews.com/  This test is not yet approved or cleared by the [place] FDA and  has been authorized for detection and/or diagnosis of SARS-CoV-2 by FDA under an Emergency Use Authorization (EUA). This EUA will remain  in  effect (meaning this test can be used) for the duration of the COVID-19 declaration under Se ction 564(b)(1) of the Act, 21 U.S.C. section 360bbb-3(b)(1), unless the authorization is terminated or revoked sooner.  Performed at [facility], [place] [address] [place], [place] [address]       Radiology Studies: DG Chest 2 View  Result Date: 04/22/2020 CLINICAL DATA:  Shortness of breath EXAM: CHEST - 2 VIEW COMPARISON:  October 02, 2018 FINDINGS: The heart size and mediastinal contours are within normal limits. Aortic knob calcifications are seen. There is prominence of the central pulmonary vasculature. No large airspace consolidation or effusion. The visualized skeletal structures are unremarkable. IMPRESSION: Mild pulmonary vascular congestion. Electronically Signed   By: Prudencio Pair M.D.   On: 04/22/2020 18:38   ECHOCARDIOGRAM COMPLETE  Result Date: 04/23/2020    ECHOCARDIOGRAM REPORT   Patient Name:   [facility] Date of Exam: 04/23/2020 Medical Rec #:  [id number]     Height:       58.0 in Accession #:    [id number]    Weight:       152.8 lb Date of Birth:  Aug 28, 1930     BSA:          1.624 m Patient Age:    11 years      BP:           94/75 mmHg Patient Gender: F             HR:           74 bpm. Exam Location:  Inpatient Procedure: 2D Echo, Cardiac Doppler and Color Doppler Indications:    I50.40* Unspecified combined systolic (congestive) and diastolic                 (congestive) heart failure  History:        Patient has no prior history of Echocardiogram examinations.                 CHF, Abnormal ECG, Arrythmias:Atrial Fibrillation,                 Signs/Symptoms:Hypotension; Risk Factors:Hypertension and                 Dyslipidemia.  Sonographer:    Roseanna Rainbow RDCS Referring Phys: [place]  Sonographer Comments: Technically difficult study due to poor echo windows. IMPRESSIONS  1. Left ventricular ejection fraction, by estimation, is 55 to 60%. The left ventricle has  normal function. The left ventricle has no regional wall motion abnormalities. There is mild concentric left ventricular hypertrophy. Left ventricular diastolic parameters are  indeterminate.  2. Right ventricular systolic function is mildly reduced. The right ventricular size is mildly enlarged. There is normal pulmonary artery systolic pressure.  3. Left atrial size was mildly dilated.  4. Right atrial size was mildly dilated.  5. The mitral valve is abnormal. Trivial mitral valve regurgitation. No evidence of mitral stenosis. Moderate mitral annular calcification.  6. The aortic valve is tricuspid. There is mild calcification of the aortic valve. There is mild thickening of the aortic valve. Aortic valve regurgitation is not visualized. Mild aortic valve sclerosis is present, with no evidence of aortic valve stenosis. FINDINGS  Left Ventricle: Left ventricular ejection fraction, by estimation, is 55 to 60%. The left ventricle has normal function. The left ventricle has no regional wall motion abnormalities. The left ventricular internal cavity size was normal in size. There is  mild concentric left ventricular hypertrophy. Left ventricular diastolic parameters are indeterminate. Right Ventricle: The right ventricular size is mildly enlarged. Right vetricular wall thickness was not well visualized. Right ventricular systolic function is mildly reduced. There is normal pulmonary artery systolic pressure. The tricuspid regurgitant velocity is 2.51 m/s, and with an assumed right atrial pressure of 8 mmHg, the estimated right ventricular systolic pressure is 02.7 mmHg. Left Atrium: Left atrial size was mildly dilated. Right Atrium: Right atrial size was mildly dilated. Pericardium: There is no evidence of pericardial effusion. Mitral Valve: The mitral valve is abnormal. There is moderate thickening of the mitral valve leaflet(s). There is moderate calcification of the mitral valve leaflet(s). Moderate mitral annular  calcification. Trivial mitral valve regurgitation. No evidence of mitral valve stenosis. Tricuspid Valve: The tricuspid valve is normal in structure. Tricuspid valve regurgitation is mild. Aortic Valve: The aortic valve is tricuspid. There is mild calcification of the aortic valve. There is mild thickening of the aortic valve. Aortic valve regurgitation is not visualized. Mild aortic valve sclerosis is present, with no evidence of aortic valve stenosis. Pulmonic Valve: The pulmonic valve was normal in structure. Pulmonic valve regurgitation is trivial. Aorta: The aortic root is normal in size and structure. IAS/Shunts: No atrial level shunt detected by color flow Doppler.  LEFT VENTRICLE PLAX 2D LVIDd:         2.70 cm LVIDs:         1.90 cm LV PW:         1.70 cm LV IVS:        1.40 cm LVOT diam:     1.70 cm LV SV:         42 LV SV Index:   26 LVOT Area:     2.27 cm  LV Volumes (MOD) LV vol d, MOD A2C: 25.7 ml LV vol d, MOD A4C: 35.5 ml LV vol s, MOD A2C: 6.8 ml LV vol s, MOD A4C: 13.9 ml LV SV MOD A2C:     18.9 ml LV SV MOD A4C:     35.5 ml LV SV MOD BP:      20.1 ml RIGHT VENTRICLE            IVC RV S prime:     9.36 cm/s  IVC diam: 2.70 cm TAPSE (M-mode): 1.6 cm LEFT ATRIUM             Index       RIGHT ATRIUM           Index LA diam:        4.60 cm 2.83 cm/m  RA Area:     14.70 cm  LA Vol (A2C):   32.0 ml 19.70 ml/m RA Volume:   40.00 ml  24.63 ml/m LA Vol (A4C):   60.8 ml 37.44 ml/m LA Biplane Vol: 47.4 ml 29.18 ml/m  AORTIC VALVE             PULMONIC VALVE LVOT Vmax:   93.30 cm/s  PR End Diast Vel: 1.61 msec LVOT Vmean:  67.300 cm/s LVOT VTI:    0.186 m  AORTA Ao Root diam: 3.30 cm MITRAL VALVE                TRICUSPID VALVE MV Area (PHT): 4.21 cm     TR Peak grad:   25.2 mmHg MV Decel Time: 180 msec     TR Vmax:        251.00 cm/s MV E velocity: 102.00 cm/s                             SHUNTS                             Systemic VTI:  0.19 m                             Systemic Diam: 1.70 cm Gwyndolyn Kaufman MD Electronically signed by Gwyndolyn Kaufman MD Signature Date/Time: 04/23/2020/5:40:11 PM    Final    VAS [place] LOWER EXTREMITY VENOUS (DVT)  Result Date: 04/23/2020  Lower Venous DVT Study Indications: Increased bilateral edema.  Anticoagulation: Eliquis. Comparison       01-19-2020 Bilateral lower extremity venous was negative for Study:           DVT. Performing Technologist: Darlin Coco RDMS, RVT  Examination Guidelines: A complete evaluation includes B-mode imaging, spectral Doppler, color Doppler, and power Doppler as needed of all accessible portions of each vessel. Bilateral testing is considered an integral part of a complete examination. Limited examinations for reoccurring indications may be performed as noted. The reflux portion of the exam is performed with the patient in reverse Trendelenburg.  +---------+---------------+---------+-----------+----------+--------------+ RIGHT    CompressibilityPhasicitySpontaneityPropertiesThrombus Aging +---------+---------------+---------+-----------+----------+--------------+ CFV      Full           Yes      Yes                                 +---------+---------------+---------+-----------+----------+--------------+ SFJ      Full                                                        +---------+---------------+---------+-----------+----------+--------------+ FV Prox  Full                                                        +---------+---------------+---------+-----------+----------+--------------+ FV Mid   Full                                                        +---------+---------------+---------+-----------+----------+--------------+  FV DistalFull                                                        +---------+---------------+---------+-----------+----------+--------------+ PFV      Full                                                         +---------+---------------+---------+-----------+----------+--------------+ POP      Full           Yes      Yes                                 +---------+---------------+---------+-----------+----------+--------------+ PTV      Full                                                        +---------+---------------+---------+-----------+----------+--------------+ PERO     Full                                                        +---------+---------------+---------+-----------+----------+--------------+   +---------+---------------+---------+-----------+----------+--------------+ LEFT     CompressibilityPhasicitySpontaneityPropertiesThrombus Aging +---------+---------------+---------+-----------+----------+--------------+ CFV      Full           Yes      Yes                                 +---------+---------------+---------+-----------+----------+--------------+ SFJ      Full                                                        +---------+---------------+---------+-----------+----------+--------------+ FV Prox  Full                                                        +---------+---------------+---------+-----------+----------+--------------+ FV Mid   Full                                                        +---------+---------------+---------+-----------+----------+--------------+ FV DistalFull                                                        +---------+---------------+---------+-----------+----------+--------------+  PFV      Full                                                        +---------+---------------+---------+-----------+----------+--------------+ POP      Full           Yes      Yes                                 +---------+---------------+---------+-----------+----------+--------------+ PTV      Full                                                         +---------+---------------+---------+-----------+----------+--------------+ PERO     Full                                                        +---------+---------------+---------+-----------+----------+--------------+     Summary: RIGHT: - There is no evidence of deep vein thrombosis in the lower extremity.  - No cystic structure found in the popliteal fossa. - Ultrasound characteristics of enlarged lymph nodes are noted in the groin.  LEFT: - There is no evidence of deep vein thrombosis in the lower extremity.  - No cystic structure found in the popliteal fossa.  *See table(s) above for measurements and observations. Electronically signed by Monica Martinez MD on 04/23/2020 at 3:44:20 PM.    Final      Scheduled Meds: . apixaban  5 mg Oral BID  . brimonidine  1 drop Both Eyes BID  . diltiazem  180 mg Oral Daily  . ferrous sulfate  325 mg Oral BID  . furosemide  40 mg Intravenous Q12H  . insulin aspart  0-9 Units Subcutaneous TID WC  . latanoprost  1 drop Both Eyes QHS  . levothyroxine  88 mcg Oral QAC breakfast  . metoprolol succinate  100 mg Oral BID  . mirtazapine  15 mg Oral QHS  . pantoprazole  40 mg Oral Daily  . pramipexole  1 mg Oral QHS  . pregabalin  25 mg Oral Daily  . simvastatin  20 mg Oral QPM  . timolol  1 drop Both Eyes BID   Continuous Infusions: . sodium chloride Stopped (04/24/20 0014)  . cefTRIAXone (ROCEPHIN)  IV Stopped (04/23/20 2226)     LOS: 1 day   Time Spent in minutes   30 minutes  Tae Vonada D.O. on 04/24/2020 at 12:54 PM  Between 7am to 7pm - Please see pager noted on [email or web address]  After 7pm go to [email or web address]  And look for the night coverage person covering for me after hours  [facility] Office  [phone]

## 2020-04-25 DIAGNOSIS — I509 Heart failure, unspecified: Secondary | ICD-10-CM | POA: Diagnosis not present

## 2020-04-25 DIAGNOSIS — I5033 Acute on chronic diastolic (congestive) heart failure: Secondary | ICD-10-CM | POA: Diagnosis not present

## 2020-04-25 DIAGNOSIS — I480 Paroxysmal atrial fibrillation: Secondary | ICD-10-CM | POA: Diagnosis not present

## 2020-04-25 DIAGNOSIS — L03119 Cellulitis of unspecified part of limb: Secondary | ICD-10-CM | POA: Diagnosis not present

## 2020-04-25 LAB — BASIC METABOLIC PANEL
Anion gap: 11 (ref 5–15)
BUN: 45 mg/dL — ABNORMAL HIGH (ref 8–23)
CO2: 33 mmol/L — ABNORMAL HIGH (ref 22–32)
Calcium: 8.8 mg/dL — ABNORMAL LOW (ref 8.9–10.3)
Chloride: 88 mmol/L — ABNORMAL LOW (ref 98–111)
Creatinine, Ser: 1.14 mg/dL — ABNORMAL HIGH (ref 0.44–1.00)
GFR, Estimated: 47 mL/min — ABNORMAL LOW (ref >60)
Glucose, Bld: 113 mg/dL — ABNORMAL HIGH (ref 70–99)
Potassium: 2.6 mmol/L — CL (ref 3.5–5.1)
Sodium: 132 mmol/L — ABNORMAL LOW (ref 135–145)

## 2020-04-25 LAB — GLUCOSE, CAPILLARY
Glucose-Capillary: 128 mg/dL — ABNORMAL HIGH (ref 70–99)
Glucose-Capillary: 148 mg/dL — ABNORMAL HIGH (ref 70–99)
Glucose-Capillary: 127 mg/dL — ABNORMAL HIGH (ref 70–99)
Glucose-Capillary: 166 mg/dL — ABNORMAL HIGH (ref 70–99)

## 2020-04-25 LAB — MAGNESIUM: Magnesium: 1.7 mg/dL (ref 1.7–2.4)

## 2020-04-25 MED ORDER — POTASSIUM CHLORIDE 20 MEQ PO PACK
40.0000 meq | PACK | Freq: Once | ORAL | Status: AC
  Administered 2020-04-25: 40 meq via ORAL
  Filled 2020-04-25: qty 2

## 2020-04-25 MED ORDER — MAGNESIUM SULFATE IN D5W 1-5 GM/100ML-% IV SOLN
1.0000 g | Freq: Once | INTRAVENOUS | Status: AC
  Administered 2020-04-25: 1 g via INTRAVENOUS
  Filled 2020-04-25: qty 100

## 2020-04-25 MED ORDER — FUROSEMIDE 10 MG/ML IJ SOLN: 40.0000 mg | Freq: Every day | INTRAMUSCULAR | Status: DC

## 2020-04-25 NOTE — Progress Notes (Signed)
PROGRESS NOTE    Dana Bradford  TIW:[id number] DOB: 14-Oct-1930 DOA: 04/22/2020 PCP: Jonathon Resides, MD   Brief Narrative:  HPI on 04/22/2020 by Dr. Gean Birchwood Dana Bradford is a 87 y.o. female with history of A. fib, hypertension, diabetes mellitus, bilateral lower extremity edema has been having increasing lower extremity edema over the last couple weeks and patient's Lasix dose increased to double despite which swelling is worsened.  Patient also noticed increasing redness in the lower extremity with warmth concerning for infection over the last 24 hours.  Patient was advised to come to the ER.  Interim history Patient met with CHF exacerbation as well as right lower extremity cellulitis.  Currently on IV Lasix as well as IV ceftriaxone. Assessment & Plan   Acute CHF exacerbation, suspect diastolic  -Patient presented with lower extremity edema -Chest x-ray showed venous congestion.  BNP 908 -Patient had echocardiogram at outside facility back in February 2021 which showed an EF of 55 to 25%, diastolic parameters were indeterminate. -Echocardiogram EF 55 to 60%, no regional wall motion abnormalities, mild concentric LV H.  LV diastolic parameters are indeterminate.  RV systolic function mildly reduced, RV is mildly enlarged.  Bilateral atria mildly dilated. -Home medications of Lasix and Zaroxolyn currently held -was initially placed on Lasix 40 mg IV twice daily- will transition to daily -Monitor intake and output, daily weights (UOP 1400cc over past 24 hours)  Right lower extremity cellulitis -Patient with edema and marked erythema, along with warmth -Placed on IV ceftriaxone -Lower extremity Doppler was unremarkable for DVT.  Although ultrasound did show enlarged lymph nodes in the right groin.  Atrial fibrillation, chronic -Continue Cardizem, Eliquis  Acute kidney injury -Creatinine on admission was 1.54 (baseline appears to be less than 1, was 0.89 back in December  2021) -Creatinine down to 1.14 today -Will continue to monitor closely given use of IV Lasix  Diabetes mellitus, type II -Continue insulin sliding scale, CBG monitoring -Metformin currently held  Hypothyroidism -TSH 0.556, continue Synthroid  Hypokalemia -Despite replacement, potassium was 2.6 today -Magnesium 1.7 -We will continue to replete potassium and give dose of magnesium  Mild hyponatremia -Sodium currently 132, suspect secondary to Lasix -Continue to monitor BMP  Deconditioning -Pending PT and OT evaluations  DVT Prophylaxis Eliquis  Code Status: DNR  Family Communication: None at bedside  Disposition Plan:  Status is: Inpatient  Remains inpatient appropriate because:Hemodynamically unstable and IV treatments appropriate due to intensity of illness or inability to take PO   Dispo: The patient is from: Home              Anticipated d/c is to: Home              Patient currently is not medically stable to d/c.   Difficult to place patient No   Consultants None  Procedures  Lower extremity Doppler Echocardiogram  Antibiotics   Anti-infectives (From admission, onward)   Start     Dose/Rate Route Frequency Ordered Stop   04/23/20 2200  cefTRIAXone (ROCEPHIN) 1 g in sodium chloride 0.9 % 100 mL IVPB        1 g 200 mL/hr over 30 Minutes Intravenous Daily at bedtime 04/22/20 2245     04/22/20 2145  cefTRIAXone (ROCEPHIN) 1 g in sodium chloride 0.9 % 100 mL IVPB        1 g 200 mL/hr over 30 Minutes Intravenous  Once 04/22/20 2131 04/22/20 2244      Subjective:   Dana Bradford  seen and examined today.  Feels her lower extremity swelling has improved, continues to have some discomfort in the right lower extremity.  Denies current chest pain, shortness of breath, abdominal pain, nausea or vomiting, diarrhea or constipation, dizziness or headache.  Feels she has been urinating quite frequently.   Objective:   Vitals:   04/25/20 0017 04/25/20 0156 04/25/20  0354 04/25/20 1151  BP: 100/74  94/69 112/87  Pulse: 69  63 80  Resp: '20  20 17  ' Temp: 98.1 F (36.7 C)  97.6 F (36.4 C) 98 F (36.7 C)  TempSrc: Oral  Oral   SpO2: 95%  94% 95%  Weight:  62.8 kg    Height:        Intake/Output Summary (Last 24 hours) at 04/25/2020 1306 Last data filed at 04/25/2020 0909 Gross per 24 hour  Intake 800.33 ml  Output 1400 ml  Net -599.67 ml   Filed Weights   04/23/20 1824 04/24/20 0344 04/25/20 0156  Weight: 62.6 kg 63.4 kg 62.8 kg   Exam  General: Well developed, elderly, chronically ill-appearing, NAD  HEENT: NCAT, mucous membranes moist.   Cardiovascular: S1 S2 auscultated, irregular   Respiratory: Diminished breath sounds, no wheezing  Abdomen: Soft, nontender, nondistended, + bowel sounds  Extremities: warm dry without cyanosis clubbing.  LE edema R>L, improving.  LE erythema, greater on the right lower extremity.  Neuro: AAOx3, nonfocal  Psych: Normal affect and demeanor with intact judgement and insight  Data Reviewed: I have personally reviewed following labs and imaging studies  CBC: Recent Labs  Lab 04/22/20 1756  WBC 9.4  NEUTROABS 6.0  HGB 12.9  HCT 39.1  MCV 94.2  PLT 202   Basic Metabolic Panel: Recent Labs  Lab 04/22/20 1756 04/23/20 0648 04/24/20 0459 04/25/20 0438  NA 136 136 133* 132*  K 3.1* 3.2* 3.8 2.6*  CL 95* 92* 92* 88*  CO2 '29 31 29 ' 33*  GLUCOSE 113* 125* 130* 113*  BUN 59* 48* 47* 45*  CREATININE 1.54* 1.09* 1.17* 1.14*  CALCIUM 9.3 9.4 9.2 8.8*  MG  --  1.9  --  1.7   GFR: Estimated Creatinine Clearance: 27.3 mL/min (A) (by C-G formula based on SCr of 1.14 mg/dL (H)). Liver Function Tests: Recent Labs  Lab 04/22/20 1756  AST 24  ALT 24  ALKPHOS 95  BILITOT 0.8  PROT 8.2*  ALBUMIN 3.2*   No results for input(s): LIPASE, AMYLASE in the last 168 hours. No results for input(s): AMMONIA in the last 168 hours. Coagulation Profile: No results for input(s): INR, PROTIME in the last  168 hours. Cardiac Enzymes: No results for input(s): CKTOTAL, CKMB, CKMBINDEX, TROPONINI in the last 168 hours. BNP (last 3 results) No results for input(s): PROBNP in the last 8760 hours. HbA1C: No results for input(s): HGBA1C in the last 72 hours. CBG: Recent Labs  Lab 04/24/20 1144 04/24/20 1636 04/24/20 2109 04/25/20 0608 04/25/20 1152  GLUCAP 181* 72 112* 128* 148*   Lipid Profile: No results for input(s): CHOL, HDL, LDLCALC, TRIG, CHOLHDL, LDLDIRECT in the last 72 hours. Thyroid Function Tests: Recent Labs    04/23/20 1851  TSH 0.556   Anemia Panel: No results for input(s): VITAMINB12, FOLATE, FERRITIN, TIBC, IRON, RETICCTPCT in the last 72 hours. Urine analysis:    Component Value Date/Time   COLORURINE YELLOW 01/19/2020 [place] 01/19/2020 0150   LABSPEC 1.016 01/19/2020 0150   PHURINE 5.0 01/19/2020 [place] 01/19/2020 0150  HGBUR MODERATE (A) 01/19/2020 0150   BILIRUBINUR NEGATIVE 01/19/2020 0150   KETONESUR NEGATIVE 01/19/2020 0150   PROTEINUR NEGATIVE 01/19/2020 0150   NITRITE NEGATIVE 01/19/2020 0150   LEUKOCYTESUR NEGATIVE 01/19/2020 0150   Sepsis Labs: '@LABRCNTIP' (procalcitonin:4,lacticidven:4)  ) Recent Results (from the past 240 hour(s))  SARS CORONAVIRUS 2 (TAT 6-24 HRS) Nasopharyngeal Nasopharyngeal Swab     Status: None   Collection Time: 04/22/20 10:13 PM   Specimen: Nasopharyngeal Swab  Result Value Ref Range Status   SARS Coronavirus 2 NEGATIVE NEGATIVE Final    Comment: (NOTE) SARS-CoV-2 target nucleic acids are NOT DETECTED.  The SARS-CoV-2 RNA is generally detectable in upper and lower respiratory specimens during the acute phase of infection. Negative results do not preclude SARS-CoV-2 infection, do not rule out co-infections with other pathogens, and should not be used as the sole basis for treatment or other patient management decisions. Negative results must be combined with clinical  observations, patient history, and epidemiological information. The expected result is Negative.  Fact Sheet for Patients: SugarRoll.be  Fact Sheet for Healthcare Providers: https://www.woods-mathews.com/  This test is not yet approved or cleared by the [place] FDA and  has been authorized for detection and/or diagnosis of SARS-CoV-2 by FDA under an Emergency Use Authorization (EUA). This EUA will remain  in effect (meaning this test can be used) for the duration of the COVID-19 declaration under Se ction 564(b)(1) of the Act, 21 U.S.C. section 360bbb-3(b)(1), unless the authorization is terminated or revoked sooner.  Performed at [facility], [place] [address], [place], [place] [address]       Radiology Studies: ECHOCARDIOGRAM COMPLETE  Result Date: 04/23/2020    ECHOCARDIOGRAM REPORT   Patient Name:   [facility] Date of Exam: 04/23/2020 Medical Rec #:  [id number]     Height:       58.0 in Accession #:    [id number]    Weight:       152.8 lb Date of Birth:  1930/05/22     BSA:          1.624 m Patient Age:    36 years      BP:           94/75 mmHg Patient Gender: F             HR:           74 bpm. Exam Location:  Inpatient Procedure: 2D Echo, Cardiac Doppler and Color Doppler Indications:    I50.40* Unspecified combined systolic (congestive) and diastolic                 (congestive) heart failure  History:        Patient has no prior history of Echocardiogram examinations.                 CHF, Abnormal ECG, Arrythmias:Atrial Fibrillation,                 Signs/Symptoms:Hypotension; Risk Factors:Hypertension and                 Dyslipidemia.  Sonographer:    Roseanna Rainbow RDCS Referring Phys: [place]  Sonographer Comments: Technically difficult study due to poor echo windows. IMPRESSIONS  1. Left ventricular ejection fraction, by estimation, is 55 to 60%. The left ventricle has normal function. The left ventricle has no  regional wall motion abnormalities. There is mild concentric left ventricular hypertrophy. Left ventricular diastolic parameters are indeterminate.  2. Right ventricular  systolic function is mildly reduced. The right ventricular size is mildly enlarged. There is normal pulmonary artery systolic pressure.  3. Left atrial size was mildly dilated.  4. Right atrial size was mildly dilated.  5. The mitral valve is abnormal. Trivial mitral valve regurgitation. No evidence of mitral stenosis. Moderate mitral annular calcification.  6. The aortic valve is tricuspid. There is mild calcification of the aortic valve. There is mild thickening of the aortic valve. Aortic valve regurgitation is not visualized. Mild aortic valve sclerosis is present, with no evidence of aortic valve stenosis. FINDINGS  Left Ventricle: Left ventricular ejection fraction, by estimation, is 55 to 60%. The left ventricle has normal function. The left ventricle has no regional wall motion abnormalities. The left ventricular internal cavity size was normal in size. There is  mild concentric left ventricular hypertrophy. Left ventricular diastolic parameters are indeterminate. Right Ventricle: The right ventricular size is mildly enlarged. Right vetricular wall thickness was not well visualized. Right ventricular systolic function is mildly reduced. There is normal pulmonary artery systolic pressure. The tricuspid regurgitant velocity is 2.51 m/s, and with an assumed right atrial pressure of 8 mmHg, the estimated right ventricular systolic pressure is 10.2 mmHg. Left Atrium: Left atrial size was mildly dilated. Right Atrium: Right atrial size was mildly dilated. Pericardium: There is no evidence of pericardial effusion. Mitral Valve: The mitral valve is abnormal. There is moderate thickening of the mitral valve leaflet(s). There is moderate calcification of the mitral valve leaflet(s). Moderate mitral annular calcification. Trivial mitral valve  regurgitation. No evidence of mitral valve stenosis. Tricuspid Valve: The tricuspid valve is normal in structure. Tricuspid valve regurgitation is mild. Aortic Valve: The aortic valve is tricuspid. There is mild calcification of the aortic valve. There is mild thickening of the aortic valve. Aortic valve regurgitation is not visualized. Mild aortic valve sclerosis is present, with no evidence of aortic valve stenosis. Pulmonic Valve: The pulmonic valve was normal in structure. Pulmonic valve regurgitation is trivial. Aorta: The aortic root is normal in size and structure. IAS/Shunts: No atrial level shunt detected by color flow Doppler.  LEFT VENTRICLE PLAX 2D LVIDd:         2.70 cm LVIDs:         1.90 cm LV PW:         1.70 cm LV IVS:        1.40 cm LVOT diam:     1.70 cm LV SV:         42 LV SV Index:   26 LVOT Area:     2.27 cm  LV Volumes (MOD) LV vol d, MOD A2C: 25.7 ml LV vol d, MOD A4C: 35.5 ml LV vol s, MOD A2C: 6.8 ml LV vol s, MOD A4C: 13.9 ml LV SV MOD A2C:     18.9 ml LV SV MOD A4C:     35.5 ml LV SV MOD BP:      20.1 ml RIGHT VENTRICLE            IVC RV S prime:     9.36 cm/s  IVC diam: 2.70 cm TAPSE (M-mode): 1.6 cm LEFT ATRIUM             Index       RIGHT ATRIUM           Index LA diam:        4.60 cm 2.83 cm/m  RA Area:     14.70 cm LA Vol (A2C):  32.0 ml 19.70 ml/m RA Volume:   40.00 ml  24.63 ml/m LA Vol (A4C):   60.8 ml 37.44 ml/m LA Biplane Vol: 47.4 ml 29.18 ml/m  AORTIC VALVE             PULMONIC VALVE LVOT Vmax:   93.30 cm/s  PR End Diast Vel: 1.61 msec LVOT Vmean:  67.300 cm/s LVOT VTI:    0.186 m  AORTA Ao Root diam: 3.30 cm MITRAL VALVE                TRICUSPID VALVE MV Area (PHT): 4.21 cm     TR Peak grad:   25.2 mmHg MV Decel Time: 180 msec     TR Vmax:        251.00 cm/s MV E velocity: 102.00 cm/s                             SHUNTS                             Systemic VTI:  0.19 m                             Systemic Diam: 1.70 cm Gwyndolyn Kaufman MD Electronically signed by  Gwyndolyn Kaufman MD Signature Date/Time: 04/23/2020/5:40:11 PM    Final      Scheduled Meds: . apixaban  5 mg Oral BID  . brimonidine  1 drop Both Eyes BID  . diltiazem  180 mg Oral Daily  . ferrous sulfate  325 mg Oral BID  . furosemide  40 mg Intravenous Q12H  . insulin aspart  0-9 Units Subcutaneous TID WC  . latanoprost  1 drop Both Eyes QHS  . levothyroxine  88 mcg Oral QAC breakfast  . metoprolol succinate  100 mg Oral BID  . mirtazapine  15 mg Oral QHS  . pantoprazole  40 mg Oral Daily  . pramipexole  1 mg Oral QHS  . pregabalin  25 mg Oral Daily  . simvastatin  20 mg Oral QPM  . timolol  1 drop Both Eyes BID   Continuous Infusions: . sodium chloride Stopped (04/24/20 2205)  . cefTRIAXone (ROCEPHIN)  IV Stopped (04/24/20 2155)     LOS: 2 days   Time Spent in minutes   30 minutes  Romi Rathel D.O. on 04/25/2020 at 1:06 PM  Between 7am to 7pm - Please see pager noted on [email or web address]  After 7pm go to [email or web address]  And look for the night coverage person covering for me after hours  [facility] Office  [phone]

## 2020-04-25 NOTE — Progress Notes (Signed)
Patient K+ 2.6, MD notified

## 2020-04-25 NOTE — Evaluation (Signed)
Physical Therapy Evaluation Patient Details Name: Dana Bradford MRN: [id number] DOB: [birth year] Today's Date: 04/25/2020   History of Present Illness  87 y/o female presented to ED on 3/2 for bilateral foot/ankle swelling, redness, and warm to touch. Found to have acute exacerbation of CHF and R LE cellulitis. PMH: A. fib on chronic anticoagulation, CHF, hypertension, diabetes, venous stasis ulcers, stroke, restless leg, acute renal failure, anxiety and depression  Clinical Impression  PTA, patient lives alone and reports modI for mobility with RW. Patient presents with generalized weakness, decreased activity tolerance, impaired balance. Patient overall min guard-supervision for OOB mobility. Patient's goal is to walk with a cane. Patient will benefit from skilled PT services during acute stay to address listed deficits. Recommend OPPT following discharge to maximize functional mobility and independence.     Follow Up Recommendations Outpatient PT    Equipment Recommendations  None recommended by PT    Recommendations for Other Services       Precautions / Restrictions Precautions Precautions: Fall Restrictions Weight Bearing Restrictions: No      Mobility  Bed Mobility               General bed mobility comments: In recliner on arrival    Transfers Overall transfer level: Needs assistance Equipment used: Rolling Brentney Goldbach (2 wheeled) Transfers: Sit to/from Stand Sit to Stand: Min guard         General transfer comment: min guard for safety, increased time and effort  Ambulation/Gait Ambulation/Gait assistance: Supervision Gait Distance (Feet): 30 Feet Assistive device: Rolling Uzziel Russey (2 wheeled) Gait Pattern/deviations: Step-through pattern;Decreased stride length;Trunk flexed Gait velocity: decreased   General Gait Details: slow, steady pace. trunk and neck flexed throughout  Stairs            Wheelchair Mobility    Modified Rankin (Stroke Patients  Only)       Balance Overall balance assessment: Mild deficits observed, not formally tested                                           Pertinent Vitals/Pain Pain Assessment: Faces Faces Pain Scale: No hurt Pain Intervention(s): Monitored during session    Home Living Family/patient expects to be discharged to:: Private residence Living Arrangements: Alone Available Help at Discharge: Family;Available PRN/intermittently Type of Home: House Home Access: Stairs to enter Entrance Stairs-Rails: Right;Left;Can reach both Entrance Stairs-Number of Steps: 3 Home Layout: One level Home Equipment: Ilah Boule - 2 wheels;Cane - single point Additional Comments: Adjustable bed without rails, several canes including hurry-cane,    Prior Function Level of Independence: Independent with assistive device(s)         Comments: ambulates with RW. Daughter drives patient. Independent with ADLs and iADLs per patient     Hand Dominance        Extremity/Trunk Assessment   Upper Extremity Assessment Upper Extremity Assessment: Defer to OT evaluation    Lower Extremity Assessment Lower Extremity Assessment: Generalized weakness       Communication   Communication: HOH  Cognition Arousal/Alertness: Awake/alert Behavior During Therapy: WFL for tasks assessed/performed Overall Cognitive Status: Within Functional Limits for tasks assessed                                        General Comments  Exercises     Assessment/Plan    PT Assessment Patient needs continued PT services  PT Problem List Decreased strength;Decreased activity tolerance;Decreased balance;Decreased mobility       PT Treatment Interventions Gait training;Stair training;DME instruction;Functional mobility training;Therapeutic activities;Therapeutic exercise;Balance training;Patient/family education    PT Goals (Current goals can be found in the Care Plan section)  Acute  Rehab PT Goals Patient Stated Goal: to go home and walk with a cane PT Goal Formulation: With patient Time For Goal Achievement: 05/09/20 Potential to Achieve Goals: Good    Frequency Min 3X/week   Barriers to discharge        Co-evaluation               AM-PAC PT "6 Clicks" Mobility  Outcome Measure Help needed turning from your back to your side while in a flat bed without using bedrails?: A Little Help needed moving from lying on your back to sitting on the side of a flat bed without using bedrails?: A Little Help needed moving to and from a bed to a chair (including a wheelchair)?: A Little Help needed standing up from a chair using your arms (e.g., wheelchair or bedside chair)?: A Little Help needed to walk in hospital room?: A Little Help needed climbing 3-5 steps with a railing? : A Little 6 Click Score: 18    End of Session Equipment Utilized During Treatment: Gait belt Activity Tolerance: Patient tolerated treatment well Patient left: in chair;with call bell/phone within reach;with family/visitor present Nurse Communication: Mobility status PT Visit Diagnosis: Unsteadiness on feet (R26.81);Muscle weakness (generalized) (M62.81);Difficulty in walking, not elsewhere classified (R26.2)    Time: 5993-5701 PT Time Calculation (min) (ACUTE ONLY): 36 min   Charges:   PT Evaluation $PT Eval Low Complexity: 1 Low PT Treatments $Therapeutic Activity: 23-37 mins        Brailynn Breth A. Gilford Rile PT, DPT Acute Rehabilitation Services Pager [phone] Office [phone]   Linna Hoff 04/25/2020, 5:00 PM

## 2020-04-25 NOTE — Progress Notes (Signed)
HR drops to 40-50's non sustained, pt awake, alert, asymptomatic. MD aware.

## 2020-04-26 DIAGNOSIS — L03119 Cellulitis of unspecified part of limb: Secondary | ICD-10-CM | POA: Diagnosis not present

## 2020-04-26 DIAGNOSIS — I509 Heart failure, unspecified: Secondary | ICD-10-CM | POA: Diagnosis not present

## 2020-04-26 DIAGNOSIS — I5033 Acute on chronic diastolic (congestive) heart failure: Secondary | ICD-10-CM | POA: Diagnosis not present

## 2020-04-26 DIAGNOSIS — I480 Paroxysmal atrial fibrillation: Secondary | ICD-10-CM | POA: Diagnosis not present

## 2020-04-26 LAB — GLUCOSE, CAPILLARY
Glucose-Capillary: 134 mg/dL — ABNORMAL HIGH (ref 70–99)
Glucose-Capillary: 138 mg/dL — ABNORMAL HIGH (ref 70–99)
Glucose-Capillary: 105 mg/dL — ABNORMAL HIGH (ref 70–99)
Glucose-Capillary: 111 mg/dL — ABNORMAL HIGH (ref 70–99)

## 2020-04-26 LAB — BASIC METABOLIC PANEL
Anion gap: 12 (ref 5–15)
BUN: 44 mg/dL — ABNORMAL HIGH (ref 8–23)
CO2: 31 mmol/L (ref 22–32)
Calcium: 9.1 mg/dL (ref 8.9–10.3)
Chloride: 88 mmol/L — ABNORMAL LOW (ref 98–111)
Creatinine, Ser: 1.14 mg/dL — ABNORMAL HIGH (ref 0.44–1.00)
GFR, Estimated: 47 mL/min — ABNORMAL LOW (ref >60)
Glucose, Bld: 115 mg/dL — ABNORMAL HIGH (ref 70–99)
Potassium: 3.4 mmol/L — ABNORMAL LOW (ref 3.5–5.1)
Sodium: 131 mmol/L — ABNORMAL LOW (ref 135–145)

## 2020-04-26 MED ORDER — POTASSIUM CHLORIDE CRYS ER 20 MEQ PO TBCR
40.0000 meq | EXTENDED_RELEASE_TABLET | Freq: Once | ORAL | Status: AC
  Administered 2020-04-26: 40 meq via ORAL
  Filled 2020-04-26: qty 2

## 2020-04-26 MED ORDER — METOLAZONE 5 MG PO TABS
5.0000 mg | ORAL_TABLET | ORAL | Status: DC
  Administered 2020-04-27: 5 mg via ORAL
  Filled 2020-04-26: qty 1

## 2020-04-26 MED ORDER — FUROSEMIDE 40 MG PO TABS
40.0000 mg | ORAL_TABLET | Freq: Every day | ORAL | Status: DC
  Administered 2020-04-26 – 2020-04-27 (×2): 40 mg via ORAL
  Filled 2020-04-26 (×2): qty 1

## 2020-04-26 NOTE — Progress Notes (Signed)
PROGRESS NOTE    Dana Bradford  THY:[id number] DOB: Sep 07, 1930 DOA: 04/22/2020 PCP: Jonathon Resides, MD   Brief Narrative:  HPI on 04/22/2020 by Dr. Gean Birchwood Dana Bradford is a 87 y.o. female with history of A. fib, hypertension, diabetes mellitus, bilateral lower extremity edema has been having increasing lower extremity edema over the last couple weeks and patient's Lasix dose increased to double despite which swelling is worsened.  Patient also noticed increasing redness in the lower extremity with warmth concerning for infection over the last 24 hours.  Patient was advised to come to the ER.  Interim history Patient met with CHF exacerbation as well as right lower extremity cellulitis.  Currently on IV Lasix as well as IV ceftriaxone. Assessment & Plan   Acute CHF exacerbation, suspect diastolic  -Patient presented with lower extremity edema -Chest x-ray showed venous congestion.  BNP 908 -Patient had echocardiogram at outside facility back in February 2021 which showed an EF of 55 to 28%, diastolic parameters were indeterminate. -Echocardiogram EF 55 to 60%, no regional wall motion abnormalities, mild concentric LV H.  LV diastolic parameters are indeterminate.  RV systolic function mildly reduced, RV is mildly enlarged.  Bilateral atria mildly dilated. -Home medications of Lasix and Zaroxolyn currently held -Monitor intake and output, daily weights (UOP 1500cc over past 24 hours) -Was on IV Lasix, will transition back to home regimen of Lasix and Zaroxolyn and continue to monitor patient for an additional 24 hours  Right lower extremity cellulitis -Patient with edema and marked erythema, along with warmth -Placed on IV ceftriaxone -Lower extremity Doppler was unremarkable for DVT.  Although ultrasound did show enlarged lymph nodes in the right groin.  Atrial fibrillation, chronic -Continue Cardizem, Eliquis  Acute kidney injury -Creatinine on admission was 1.54 (baseline  appears to be less than 1, was 0.89 back in December 2021) -Creatinine down to 1.14 today -Continue to monitor BMP ix  Diabetes mellitus, type II -Continue insulin sliding scale, CBG monitoring -Metformin currently held  Hypothyroidism -TSH 0.556, continue Synthroid  Hypokalemia -Potassium improved, but continues to be low, currently 3.4 -Continue to replace and monitor BMP  Mild hyponatremia -Sodium currently 131, suspect secondary to Lasix -Continue to monitor BMP  Deconditioning -PT and OT recommending outpatient therapy -Discussed with TOC  DVT Prophylaxis Eliquis  Code Status: DNR  Family Communication: None at bedside  Disposition Plan:  Status is: Inpatient  Remains inpatient appropriate because:Hemodynamically unstable and IV treatments appropriate due to intensity of illness or inability to take PO   Dispo: The patient is from: Home              Anticipated d/c is to: Home              Patient currently is not medically stable to d/c.   Difficult to place patient No   Consultants None  Procedures  Lower extremity Doppler Echocardiogram  Antibiotics   Anti-infectives (From admission, onward)   Start     Dose/Rate Route Frequency Ordered Stop   04/23/20 2200  cefTRIAXone (ROCEPHIN) 1 g in sodium chloride 0.9 % 100 mL IVPB        1 g 200 mL/hr over 30 Minutes Intravenous Daily at bedtime 04/22/20 2245     04/22/20 2145  cefTRIAXone (ROCEPHIN) 1 g in sodium chloride 0.9 % 100 mL IVPB        1 g 200 mL/hr over 30 Minutes Intravenous  Once 04/22/20 2131 04/22/20 2244  Subjective:   Bryson Palen seen and examined today.  Patient feels sleepy this morning.  Although feels that her legs are doing better and the pain is much more tolerable.  Continues to have some discomfort in the right lower extremity.  Denies current chest pain, shortness of breath, abdominal pain, nausea or vomiting, diarrhea constipation, dizziness or headache.   Objective:    Vitals:   04/25/20 1942 04/25/20 1944 04/26/20 0304 04/26/20 1000  BP: (!) 82/58 (!) 94/59  95/65  Pulse: 83 86  65  Resp: 18     Temp: 98.1 F (36.7 C)     TempSrc: Oral     SpO2: 90%     Weight:   63.5 kg   Height:        Intake/Output Summary (Last 24 hours) at 04/26/2020 1339 Last data filed at 04/26/2020 0950 Gross per 24 hour  Intake 407.77 ml  Output 600 ml  Net -192.23 ml   Filed Weights   04/24/20 0344 04/25/20 0156 04/26/20 0304  Weight: 63.4 kg 62.8 kg 63.5 kg   Exam  General: Well developed, elderly, chronically ill-appearing, NAD  HEENT: NCAT, mucous membranes moist.   Cardiovascular: S1 S2 auscultated, irregular   Respiratory: Diminished breath sounds, no wheezing  Abdomen: Soft, nontender, nondistended, + bowel sounds  Extremities: warm dry without cyanosis clubbing.  LE edema improving.  Erythema on RLE, several dressings in place  Neuro: AAOx3, nonfocal  Psych: pleasant, appropriate mood and affect  Data Reviewed: I have personally reviewed following labs and imaging studies  CBC: Recent Labs  Lab 04/22/20 1756  WBC 9.4  NEUTROABS 6.0  HGB 12.9  HCT 39.1  MCV 94.2  PLT 165   Basic Metabolic Panel: Recent Labs  Lab 04/22/20 1756 04/23/20 0648 04/24/20 0459 04/25/20 0438 04/26/20 0252  NA 136 136 133* 132* 131*  K 3.1* 3.2* 3.8 2.6* 3.4*  CL 95* 92* 92* 88* 88*  CO2 '29 31 29 ' 33* 31  GLUCOSE 113* 125* 130* 113* 115*  BUN 59* 48* 47* 45* 44*  CREATININE 1.54* 1.09* 1.17* 1.14* 1.14*  CALCIUM 9.3 9.4 9.2 8.8* 9.1  MG  --  1.9  --  1.7  --    GFR: Estimated Creatinine Clearance: 27.4 mL/min (A) (by C-G formula based on SCr of 1.14 mg/dL (H)). Liver Function Tests: Recent Labs  Lab 04/22/20 1756  AST 24  ALT 24  ALKPHOS 95  BILITOT 0.8  PROT 8.2*  ALBUMIN 3.2*   No results for input(s): LIPASE, AMYLASE in the last 168 hours. No results for input(s): AMMONIA in the last 168 hours. Coagulation Profile: No results for  input(s): INR, PROTIME in the last 168 hours. Cardiac Enzymes: No results for input(s): CKTOTAL, CKMB, CKMBINDEX, TROPONINI in the last 168 hours. BNP (last 3 results) No results for input(s): PROBNP in the last 8760 hours. HbA1C: No results for input(s): HGBA1C in the last 72 hours. CBG: Recent Labs  Lab 04/25/20 1152 04/25/20 1628 04/25/20 2101 04/26/20 0620 04/26/20 1205  GLUCAP 148* 127* 166* 134* 138*   Lipid Profile: No results for input(s): CHOL, HDL, LDLCALC, TRIG, CHOLHDL, LDLDIRECT in the last 72 hours. Thyroid Function Tests: Recent Labs    04/23/20 1851  TSH 0.556   Anemia Panel: No results for input(s): VITAMINB12, FOLATE, FERRITIN, TIBC, IRON, RETICCTPCT in the last 72 hours. Urine analysis:    Component Value Date/Time   COLORURINE YELLOW 01/19/2020 0150   APPEARANCEUR CLEAR 01/19/2020 0150   LABSPEC 1.016  01/19/2020 0150   PHURINE 5.0 01/19/2020 0150   GLUCOSEU NEGATIVE 01/19/2020 0150   HGBUR MODERATE (A) 01/19/2020 0150   BILIRUBINUR NEGATIVE 01/19/2020 0150   KETONESUR NEGATIVE 01/19/2020 0150   PROTEINUR NEGATIVE 01/19/2020 0150   NITRITE NEGATIVE 01/19/2020 0150   LEUKOCYTESUR NEGATIVE 01/19/2020 0150   Sepsis Labs: '@LABRCNTIP' (procalcitonin:4,lacticidven:4)  ) Recent Results (from the past 240 hour(s))  SARS CORONAVIRUS 2 (TAT 6-24 HRS) Nasopharyngeal Nasopharyngeal Swab     Status: None   Collection Time: 04/22/20 10:13 PM   Specimen: Nasopharyngeal Swab  Result Value Ref Range Status   SARS Coronavirus 2 NEGATIVE NEGATIVE Final    Comment: (NOTE) SARS-CoV-2 target nucleic acids are NOT DETECTED.  The SARS-CoV-2 RNA is generally detectable in upper and lower respiratory specimens during the acute phase of infection. Negative results do not preclude SARS-CoV-2 infection, do not rule out co-infections with other pathogens, and should not be used as the sole basis for treatment or other patient management decisions. Negative results must  be combined with clinical observations, patient history, and epidemiological information. The expected result is Negative.  Fact Sheet for Patients: SugarRoll.be  Fact Sheet for Healthcare Providers: https://www.woods-mathews.com/  This test is not yet approved or cleared by the [place] FDA and  has been authorized for detection and/or diagnosis of SARS-CoV-2 by FDA under an Emergency Use Authorization (EUA). This EUA will remain  in effect (meaning this test can be used) for the duration of the COVID-19 declaration under Se ction 564(b)(1) of the Act, 21 U.S.C. section 360bbb-3(b)(1), unless the authorization is terminated or revoked sooner.  Performed at [facility], [place] [address], [place], [place] [address]       Radiology Studies: No results found.   Scheduled Meds: . apixaban  5 mg Oral BID  . brimonidine  1 drop Both Eyes BID  . diltiazem  180 mg Oral Daily  . ferrous sulfate  325 mg Oral BID  . furosemide  40 mg Oral Daily  . insulin aspart  0-9 Units Subcutaneous TID WC  . latanoprost  1 drop Both Eyes QHS  . levothyroxine  88 mcg Oral QAC breakfast  . [START ON 04/27/2020] metolazone  5 mg Oral Once per day on Mon Thu  . metoprolol succinate  100 mg Oral BID  . mirtazapine  15 mg Oral QHS  . pantoprazole  40 mg Oral Daily  . pramipexole  1 mg Oral QHS  . pregabalin  25 mg Oral Daily  . simvastatin  20 mg Oral QPM  . timolol  1 drop Both Eyes BID   Continuous Infusions: . sodium chloride Stopped (04/24/20 2205)  . cefTRIAXone (ROCEPHIN)  IV 1 g (04/25/20 2135)     LOS: 3 days   Time Spent in minutes   30 minutes  Maryann Mikhail D.O. on 04/26/2020 at 1:39 PM  Between 7am to 7pm - Please see pager noted on [email or web address]  After 7pm go to [email or web address]  And look for the night coverage person covering for me after hours  [facility] Office  [phone]

## 2020-04-26 NOTE — Plan of Care (Signed)

## 2020-04-26 NOTE — Evaluation (Signed)
Occupational Therapy Evaluation Patient Details Name: Dana Bradford MRN: [id number] DOB: [birth year] Today's Date: 04/26/2020    History of Present Illness 87 y/o female presented to ED on 3/2 for bilateral foot/ankle swelling, redness, and warm to touch. Found to have acute exacerbation of CHF and R LE cellulitis. PMH: A. fib on chronic anticoagulation, CHF, hypertension, diabetes, venous stasis ulcers, stroke, restless leg, acute renal failure, anxiety and depression   Clinical Impression   Pt admitted with the above diagnoses and presents with below problem list. Pt will benefit from continued acute OT to address the below listed deficits and maximize independence with basic ADLs prior to d/c home. At baseline, pt is mod I with ADLs, lives alone with family nearby. Pt currently needs up to min guard assist with LB ADLs and functional transfers/mobility utilizing rw.     Follow Up Recommendations  Home health OT;Supervision - Intermittent;Outpatient OT (unclear if pt has transportation for OP therapies or is home bound)    Equipment Recommendations  None recommended by OT    Recommendations for Other Services       Precautions / Restrictions Precautions Precautions: Fall Restrictions Weight Bearing Restrictions: No      Mobility Bed Mobility Overal bed mobility: Needs Assistance Bed Mobility: Supine to Sit;Sit to Supine     Supine to sit: Min guard Sit to supine: Min guard   General bed mobility comments: min guard for safety. Extra time and effort. Pt unable to scoot up in bed, needed +2 assist to boost up in supine.    Transfers Overall transfer level: Needs assistance Equipment used: Rolling walker (2 wheeled) Transfers: Sit to/from Stand Sit to Stand: Min guard         General transfer comment: min guard for safety, increased time and effort. To/from EOB and comfort height toilet.    Balance Overall balance assessment: Needs assistance Sitting-balance support:  No upper extremity supported;Feet supported Sitting balance-Leahy Scale: Fair     Standing balance support: Bilateral upper extremity supported Standing balance-Leahy Scale: Poor Standing balance comment: reliant on external support                           ADL either performed or assessed with clinical judgement   ADL Overall ADL's : Needs assistance/impaired Eating/Feeding: Set up;Sitting   Grooming: Set up;Min guard;Sitting;Standing   Upper Body Bathing: Set up;Sitting   Lower Body Bathing: Min guard;Sit to/from stand   Upper Body Dressing : Set up;Sitting   Lower Body Dressing: Min guard;Sit to/from stand   Toilet Transfer: Min guard;Ambulation;RW;Comfort height toilet;Grab bars   Toileting- Clothing Manipulation and Hygiene: Min guard;Set up;Sitting/lateral lean;Sit to/from stand     Tub/Shower Transfer Details (indicate cue type and reason): N/A Functional mobility during ADLs: Min guard;Rolling walker General ADL Comments: Pt completed bed mobility, in room functional mobility, toitlet transfer and pericare as detailed above.     Vision Baseline Vision/History: Wears glasses Wears Glasses: Reading only       Perception     Praxis      Pertinent Vitals/Pain Pain Assessment: Faces Faces Pain Scale: Hurts a little bit Pain Location: B feet Pain Descriptors / Indicators: Constant;Tender;Tingling;Throbbing Pain Intervention(s): Monitored during session     Hand Dominance Right   Extremity/Trunk Assessment Upper Extremity Assessment Upper Extremity Assessment: Generalized weakness   Lower Extremity Assessment Lower Extremity Assessment: Defer to PT evaluation       Communication Communication Communication: [facility]   Cognition  Arousal/Alertness: Awake/alert Behavior During Therapy: WFL for tasks assessed/performed Overall Cognitive Status: Within Functional Limits for tasks assessed                                      General Comments       Exercises     Shoulder Instructions      Home Living Family/patient expects to be discharged to:: Private residence Living Arrangements: Alone Available Help at Discharge: Family;Available PRN/intermittently Type of Home: House Home Access: Stairs to enter [employer] of Steps: 3 Entrance Stairs-Rails: Right;Left;Can reach both Home Layout: One level     Bathroom Shower/Tub: Occupational psychologist: Standard     Home Equipment: Environmental consultant - 2 wheels;Cane - single point   Additional Comments: Adjustable bed without rails, several canes including hurry-cane,      Prior Functioning/Environment Level of Independence: Independent with assistive device(s)        Comments: ambulates with RW. Daughter drives patient. Independent with ADLs and iADLs per patient        OT Problem List: Decreased strength;Decreased activity tolerance;Impaired balance (sitting and/or standing);Decreased knowledge of use of DME or AE;Decreased knowledge of precautions;Pain;Cardiopulmonary status limiting activity      OT Treatment/Interventions: Self-care/ADL training;Energy conservation;DME and/or AE instruction;Therapeutic activities;Balance training;Patient/family education    OT Goals(Current goals can be found in the care plan section) Acute Rehab OT Goals Patient Stated Goal: to go home and walk with a cane OT Goal Formulation: With patient Time For Goal Achievement: 05/10/20 Potential to Achieve Goals: Good ADL Goals Pt Will Perform Grooming: with set-up;sitting;with modified independence Pt Will Perform Lower Body Bathing: with modified independence;sit to/from stand;sitting/lateral leans Pt Will Perform Lower Body Dressing: with modified independence;sitting/lateral leans;sit to/from stand Pt Will Transfer to Toilet: with modified independence;ambulating Pt Will Perform Toileting - Clothing Manipulation and hygiene: with modified  independence;sit to/from stand;sitting/lateral leans  OT Frequency: Min 2X/week   Barriers to D/C:            Co-evaluation              AM-PAC OT "6 Clicks" Daily Activity     Outcome Measure Help from another person eating meals?: None Help from another person taking care of personal grooming?: None Help from another person toileting, which includes using toliet, bedpan, or urinal?: A Little Help from another person bathing (including washing, rinsing, drying)?: A Little Help from another person to put on and taking off regular upper body clothing?: None Help from another person to put on and taking off regular lower body clothing?: A Little 6 Click Score: 21   End of Session Equipment Utilized During Treatment: Rolling walker  Activity Tolerance: Patient tolerated treatment well Patient left: in bed;with call bell/phone within reach  OT Visit Diagnosis: Unsteadiness on feet (R26.81);Pain;Muscle weakness (generalized) (M62.81)                Time: 2637-8588 OT Time Calculation (min): 22 min Charges:  OT General Charges $OT Visit: 1 Visit OT Evaluation $OT Eval Low Complexity: [place], OT Acute Rehabilitation Services Pager: [phone] Office: [phone]    Hortencia Pilar 04/26/2020, 11:45 AM

## 2020-04-27 DIAGNOSIS — I509 Heart failure, unspecified: Secondary | ICD-10-CM | POA: Diagnosis not present

## 2020-04-27 DIAGNOSIS — I480 Paroxysmal atrial fibrillation: Secondary | ICD-10-CM | POA: Diagnosis not present

## 2020-04-27 DIAGNOSIS — N179 Acute kidney failure, unspecified: Secondary | ICD-10-CM | POA: Diagnosis not present

## 2020-04-27 DIAGNOSIS — L03119 Cellulitis of unspecified part of limb: Secondary | ICD-10-CM | POA: Diagnosis not present

## 2020-04-27 LAB — BASIC METABOLIC PANEL
Anion gap: 12 (ref 5–15)
BUN: 41 mg/dL — ABNORMAL HIGH (ref 8–23)
CO2: 27 mmol/L (ref 22–32)
Calcium: 9 mg/dL (ref 8.9–10.3)
Chloride: 92 mmol/L — ABNORMAL LOW (ref 98–111)
Creatinine, Ser: 1.23 mg/dL — ABNORMAL HIGH (ref 0.44–1.00)
GFR, Estimated: 43 mL/min — ABNORMAL LOW (ref >60)
Glucose, Bld: 113 mg/dL — ABNORMAL HIGH (ref 70–99)
Potassium: 3.4 mmol/L — ABNORMAL LOW (ref 3.5–5.1)
Sodium: 131 mmol/L — ABNORMAL LOW (ref 135–145)

## 2020-04-27 LAB — GLUCOSE, CAPILLARY
Glucose-Capillary: 118 mg/dL — ABNORMAL HIGH (ref 70–99)
Glucose-Capillary: 126 mg/dL — ABNORMAL HIGH (ref 70–99)

## 2020-04-27 MED ORDER — METOPROLOL SUCCINATE ER 50 MG PO TB24: 100.0000 mg | ORAL_TABLET | Freq: Every day | ORAL | 0 refills | Status: DC

## 2020-04-27 MED ORDER — POTASSIUM CHLORIDE CRYS ER 20 MEQ PO TBCR: 40.0000 meq | EXTENDED_RELEASE_TABLET | Freq: Once | ORAL | Status: DC

## 2020-04-27 NOTE — Progress Notes (Signed)
Physical Therapy Treatment Patient Details Name: Dana Bradford MRN: [id number] DOB: 1930/09/21 Today's Date: 04/27/2020    History of Present Illness 87 y/o female presented to ED on 3/2 for bilateral foot/ankle swelling, redness, and warm to touch. Found to have acute exacerbation of CHF and R LE cellulitis. PMH: A. fib on chronic anticoagulation, CHF, hypertension, diabetes, venous stasis ulcers, stroke, restless leg, acute renal failure, anxiety and depression    PT Comments    Patient progressing slowly towards PT goals. Requires min guard assist for bed mobility, transfers and gait training with use of RW for support. Increased time/effort for all movement. Pt inquiring about wanting to purchase a hospital bed for home. Tolerated standing at sink for ADl tasks and only needing UE support occasionally. Continues to demonstrate balance deficits and generalized weakness from lack of mobility in the hospital but declining follow up PT services. Plans to d/c home today. Will follow.    Follow Up Recommendations  Outpatient PT (pt declining services)     Equipment Recommendations  None recommended by PT    Recommendations for Other Services       Precautions / Restrictions Precautions Precautions: Fall Restrictions Weight Bearing Restrictions: No    Mobility  Bed Mobility Overal bed mobility: Needs Assistance Bed Mobility: Supine to Sit;Sit to Supine     Supine to sit: Min guard;HOB elevated Sit to supine: Min assist;HOB elevated   General bed mobility comments: Increased time/effort, use of rails to get to EOB. Assist to bring LLE into bed to return to supine.    Transfers Overall transfer level: Needs assistance Equipment used: Rolling walker (2 wheeled) Transfers: Sit to/from Stand Sit to Stand: Min guard         General transfer comment: min guard for safety, increased time and effort. Stood from [employer], from toilet x1.  Ambulation/Gait Ambulation/Gait  assistance: Min guard Gait Distance (Feet): 72 Feet Assistive device: Rolling walker (2 wheeled) Gait Pattern/deviations: Step-through pattern;Decreased stride length;Trunk flexed Gait velocity: decreased   General Gait Details: Slow, mildly unsteady gait with RW for support; 1 instance of instability but able to recover without assist.   Stairs             Wheelchair Mobility    Modified Rankin (Stroke Patients Only)       Balance Overall balance assessment: Needs assistance Sitting-balance support: No upper extremity supported;Feet supported Sitting balance-Leahy Scale: Fair     Standing balance support: During functional activity Standing balance-Leahy Scale: Fair Standing balance comment: Able to stand at sink and perform ADls with need for UE support occasionally.                            Cognition Arousal/Alertness: Awake/alert Behavior During Therapy: WFL for tasks assessed/performed Overall Cognitive Status: Within Functional Limits for tasks assessed                                 General Comments: HOH so requires repetition at times.      Exercises      General Comments General comments (skin integrity, edema, etc.): VSS on RA.      Pertinent Vitals/Pain Pain Assessment: No/denies pain    Home Living                      Prior Function  PT Goals (current goals can now be found in the care plan section) Progress towards PT goals: Progressing toward goals    Frequency    Min 3X/week      PT Plan Current plan remains appropriate    Co-evaluation              AM-PAC PT "6 Clicks" Mobility   Outcome Measure  Help needed turning from your back to your side while in a flat bed without using bedrails?: A Little Help needed moving from lying on your back to sitting on the side of a flat bed without using bedrails?: A Little Help needed moving to and from a bed to a chair (including a  wheelchair)?: A Little Help needed standing up from a chair using your arms (e.g., wheelchair or bedside chair)?: A Little Help needed to walk in hospital room?: A Little Help needed climbing 3-5 steps with a railing? : A Little 6 Click Score: 18    End of Session Equipment Utilized During Treatment: Gait belt Activity Tolerance: Patient tolerated treatment well Patient left: in bed;with call bell/phone within reach;with bed alarm set Nurse Communication: Mobility status PT Visit Diagnosis: Unsteadiness on feet (R26.81);Muscle weakness (generalized) (M62.81);Difficulty in walking, not elsewhere classified (R26.2)     Time: 0600-4599 PT Time Calculation (min) (ACUTE ONLY): 30 min  Charges:  $Gait Training: 8-22 mins $Therapeutic Activity: 8-22 mins                     Marisa Severin, PT, DPT Acute Rehabilitation Services Pager [phone] Office [place] 04/27/2020, 11:42 AM

## 2020-04-27 NOTE — TOC Transition Note (Signed)
Transition of Care [facility]) - CM/SW Discharge Note   Patient Details  Name: Rianne Degraaf MRN: [id number] Date of Birth: January 12, 1931  Transition of Care [facility] CM/SW Contact:  Zenon Mayo, RN Phone Number: 04/27/2020, 10:18 AM   Clinical Narrative:    NCM spoke with patient, asked if she wanted to be set up with outpt pt, she states no, she usually gets it at home. NCM asked if could set up the HHPT for her, she states no she does not feel she needs it and that someone decided that they would say she need this but if she wants  This she will go thru her PCP to get it set up.  She has not other needs.     Final next level of care: Home/Self Care Barriers to Discharge: No Barriers Identified   Patient Goals and CMS Choice Patient states their goals for this hospitalization and ongoing recovery are:: home      Discharge Placement                       Discharge Plan and Services                  DME Agency: NA       HH Arranged: Refused HH          Social Determinants of Health (SDOH) Interventions     Readmission Risk Interventions No flowsheet data found.

## 2020-04-27 NOTE — Progress Notes (Signed)
D/C instructions given and reviewed. No questions asked but encouraged to call with any concerns. Tele and IV removed, tolerated well. 

## 2020-04-27 NOTE — Discharge Summary (Signed)
Physician Discharge Summary  Dana Bradford ZOX:[id number] DOB: 10/08/1930 DOA: 04/22/2020  PCP: Jonathon Resides, MD  Admit date: 04/22/2020 Discharge date: 04/27/2020  Time spent: 45 minutes  Recommendations for Outpatient Follow-up:  Patient will be discharged to home with outpatient physical therapy.  Patient will need to follow up with primary care provider within one week of discharge, repeat BMP. Follow up with cardiology.  Patient should continue medications as prescribed.  Patient should follow a heart healthy diet.   Discharge Diagnoses:  Acute CHF exacerbation, suspect diastolic  Right lower extremity cellulitis Atrial fibrillation, chronic Acute kidney injury Diabetes mellitus, type II Hypothyroidism Hypokalemia Mild hyponatremia Deconditioning  Discharge Condition: Stable  Diet recommendation: heart healthy diet  Filed Weights   04/25/20 0156 04/26/20 0304 04/27/20 0353  Weight: 62.8 kg 63.5 kg 63.6 kg    History of present illness:    Hospital Course:  Acute CHF exacerbation, suspect diastolic  -Patient presented with lower extremity edema -Chest x-ray showed venous congestion.  BNP 908 -Patient had echocardiogram at outside facility back in February 2021 which showed an EF of 55 to 81%, diastolic parameters were indeterminate. -Echocardiogram EF 55 to 60%, no regional wall motion abnormalities, mild concentric LV H.  LV diastolic parameters are indeterminate.  RV systolic function mildly reduced, RV is mildly enlarged.  Bilateral atria mildly dilated. -Home medications of Lasix and Zaroxolyn currently held -Monitor intake and output, daily weights (UOP 1000cc over past 24 hours) -Was on IV Lasix, transitioned back to home regimen of Lasix and Zaroxolyn and continue to monitor patient for an additional 24 hours -Discussed wearing compression stockings, limiting her salt, and fluid intake, as well as keeping her legs elevated.  Patient and I discussed in dietary  modifications. -Follow up with cardiology -repeat BMP in one week  Right lower extremity cellulitis -Patient with edema and marked erythema, along with warmth -Placed on IV ceftriaxone -Lower extremity Doppler was unremarkable for DVT.  Although ultrasound did show enlarged lymph nodes in the right groin.  Atrial fibrillation, chronic with mild bradycardia -Continue Cardizem, metoprolol Eliquis -decreased dose of Metoprolol succinate to 50mg  daily (from 100 BID)  Acute kidney injury -Creatinine on admission was 1.54 (baseline appears to be less than 1, was 0.89 back in December 2021) -Creatinine down to 1.23 today -Repeat BMP in 1 week  Diabetes mellitus, type II -Continue insulin sliding scale, CBG monitoring -Metformin currently held  Hypothyroidism -TSH 0.556, continue Synthroid  Hypokalemia -Potassium improved, currently 3.4- given replacement -Repeat BMP in 1 week  Mild hyponatremia -Sodium currently 131, suspect secondary to Lasix -Asymptomatic  Deconditioning -PT and OT recommending outpatient therapy -Discussed with TOC    Code status: DNR  Consultants None  Procedures  Lower extremity Doppler Echocardiogram  Discharge Exam: Vitals:   04/26/20 2028 04/27/20 0811  BP: 91/60 100/67  Pulse: (!) 53 (!) 51  Resp: 19 18  Temp: 98.1 F (36.7 C)   SpO2: 94% 96%     General: Well developed, chronically ill-appearing, NAD   HEENT: NCAT, mucous membranes moist.  Cardiovascular: S1 S2 auscultated, irregular  Respiratory: Clear to auscultation bilaterally   Abdomen: Soft, nontender, nondistended, + bowel sounds  Extremities: warm dry without cyanosis clubbing. LE edema improving. R erythema. LE dressing in place  Neuro: AAOx3, nonfocal  Psych: Appropriate mood and affect, pleasant   Discharge Instructions Discharge Instructions    Diet - low sodium heart healthy   Complete by: As directed    Discharge instructions   Complete  by: As  directed    Patient will be discharged to home with outpatient physical therapy.  Patient will need to follow up with primary care provider within one week of discharge, repeat BMP. Follow up with cardiology.  Patient should continue medications as prescribed.  Patient should follow a heart healthy diet.   Increase activity slowly   Complete by: As directed      Allergies as of 04/27/2020      Reactions   Arthrotec [diclofenac-misoprostol] Shortness Of Breath, Diarrhea   Doxycycline Nausea And Vomiting, Rash   Significant enough to d/c treatment and refuse to take more Vomiting (intolerance) The first time she took it she had N/V and the second she had a rash.    Latex Rash, Other (See Comments)   Amlodipine    angioedema   Celebrex [celecoxib] Other (See Comments)   GI Bleeding   Vancomycin    Acute kidney failure   Diclofenac Diarrhea   Shellfish Allergy Itching, Rash      Medication List    TAKE these medications   acetaminophen 500 MG tablet Commonly known as: TYLENOL Take 2,000 mg by mouth daily as needed for mild pain or headache.   bismuth subsalicylate 010 UV/25DG suspension Commonly known as: PEPTO BISMOL Take 30 mLs by mouth every 6 (six) hours as needed for indigestion or diarrhea or loose stools.   brimonidine 0.2 % ophthalmic solution Commonly known as: ALPHAGAN Place 1 drop into both eyes 2 (two) times daily.   diltiazem 180 MG 24 hr capsule Commonly known as: TIAZAC Take 180 mg by mouth daily.   docusate sodium 100 MG capsule Commonly known as: COLACE Take 1 capsule (100 mg total) by mouth 2 (two) times daily.   Eliquis 5 MG Tabs tablet Generic drug: apixaban Take 5 mg by mouth 2 (two) times daily.   famotidine 40 MG tablet Commonly known as: PEPCID Take 40 mg by mouth at bedtime.   ferrous sulfate 325 (65 FE) MG tablet Take 325 mg by mouth 2 (two) times daily.   furosemide 20 MG tablet Commonly known as: LASIX Take 1 tablet (20 mg total) by  mouth daily as needed for fluid or edema (weight gain >3lbs in 1 day or >5 lbs in 2 days.).   furosemide 40 MG tablet Commonly known as: LASIX Take 40 mg by mouth daily.   latanoprost 0.005 % ophthalmic solution Commonly known as: XALATAN Place 1 drop into both eyes at bedtime.   levothyroxine 88 MCG tablet Commonly known as: SYNTHROID Take 88 mcg by mouth daily before breakfast.   metFORMIN 500 MG tablet Commonly known as: GLUCOPHAGE Take 500 mg by mouth daily.   methocarbamol 500 MG tablet Commonly known as: ROBAXIN Take 1 tablet (500 mg total) by mouth every 6 (six) hours as needed for muscle spasms.   metolazone 5 MG tablet Commonly known as: ZAROXOLYN Take 5 mg by mouth 2 (two) times a week.   metoprolol succinate 50 MG 24 hr tablet Commonly known as: TOPROL-XL Take 2 tablets (100 mg total) by mouth daily. Take with or immediately following a meal. What changed:   medication strength  when to take this   mirtazapine 15 MG disintegrating tablet Commonly known as: REMERON SOL-TAB Take 1 tablet (15 mg total) by mouth at bedtime.   ondansetron 4 MG tablet Commonly known as: ZOFRAN Take 1 tablet (4 mg total) by mouth every 6 (six) hours as needed for nausea.   Polyethyl Glycol-Propyl Glycol 0.4-0.3 %  Gel ophthalmic gel Commonly known as: SYSTANE Place 1 application into both eyes 4 (four) times daily as needed (Dry eyes).   polyethylene glycol 17 g packet Commonly known as: MIRALAX / GLYCOLAX Take 17 g by mouth daily. What changed:   when to take this  reasons to take this   Potassium Chloride ER 20 MEQ Tbcr Take 20 mEq by mouth daily.   pramipexole 1 MG tablet Commonly known as: MIRAPEX Take 1 mg by mouth at bedtime.   pregabalin 25 MG capsule Commonly known as: LYRICA Take 1 capsule (25 mg total) by mouth daily.   simvastatin 20 MG tablet Commonly known as: ZOCOR Take 20 mg by mouth every evening.   timolol 0.25 % ophthalmic solution Commonly  known as: BETIMOL Place 1 drop into both eyes 2 (two) times daily.   traMADol 50 MG tablet Commonly known as: ULTRAM Take 1-2 tablets (50-100 mg total) by mouth every 6 (six) hours as needed for moderate pain.   triamcinolone ointment 0.1 % Commonly known as: KENALOG Apply 1 application topically 2 (two) times daily as needed (skin irritation.).   vitamin C 500 MG tablet Commonly known as: ASCORBIC ACID Take 500 mg by mouth 2 (two) times daily.   Vitamin D3 50 MCG (2000 UT) Tabs Take 2,000 Units by mouth daily.      Allergies  Allergen Reactions  . Arthrotec [Diclofenac-Misoprostol] Shortness Of Breath and Diarrhea  . Doxycycline Nausea And Vomiting and Rash    Significant enough to d/c treatment and refuse to take more Vomiting (intolerance) The first time she took it she had N/V and the second she had a rash.    . Latex Rash and Other (See Comments)  . Amlodipine     angioedema  . Celebrex [Celecoxib] Other (See Comments)    GI Bleeding  . Vancomycin     Acute kidney failure  . Diclofenac Diarrhea  . Shellfish Allergy Itching and Rash      The results of significant diagnostics from this hospitalization (including imaging, microbiology, ancillary and laboratory) are listed below for reference.    Significant Diagnostic Studies: DG Chest 2 View  Result Date: 04/22/2020 CLINICAL DATA:  Shortness of breath EXAM: CHEST - 2 VIEW COMPARISON:  October 02, 2018 FINDINGS: The heart size and mediastinal contours are within normal limits. Aortic knob calcifications are seen. There is prominence of the central pulmonary vasculature. No large airspace consolidation or effusion. The visualized skeletal structures are unremarkable. IMPRESSION: Mild pulmonary vascular congestion. Electronically Signed   By: Prudencio Pair M.D.   On: 04/22/2020 18:38   ECHOCARDIOGRAM COMPLETE  Result Date: 04/23/2020    ECHOCARDIOGRAM REPORT   Patient Name:   [facility] Date of Exam: 04/23/2020  Medical Rec #:  [id number]     Height:       58.0 in Accession #:    [id number]    Weight:       152.8 lb Date of Birth:  10/24/1930     BSA:          1.624 m Patient Age:    35 years      BP:           94/75 mmHg Patient Gender: F             HR:           74 bpm. Exam Location:  Inpatient Procedure: 2D Echo, Cardiac Doppler and Color Doppler Indications:    I50.40* Unspecified combined systolic (  congestive) and diastolic                 (congestive) heart failure  History:        Patient has no prior history of Echocardiogram examinations.                 CHF, Abnormal ECG, Arrythmias:Atrial Fibrillation,                 Signs/Symptoms:Hypotension; Risk Factors:Hypertension and                 Dyslipidemia.  Sonographer:    Roseanna Rainbow RDCS Referring Phys: [place]  Sonographer Comments: Technically difficult study due to poor echo windows. IMPRESSIONS  1. Left ventricular ejection fraction, by estimation, is 55 to 60%. The left ventricle has normal function. The left ventricle has no regional wall motion abnormalities. There is mild concentric left ventricular hypertrophy. Left ventricular diastolic parameters are indeterminate.  2. Right ventricular systolic function is mildly reduced. The right ventricular size is mildly enlarged. There is normal pulmonary artery systolic pressure.  3. Left atrial size was mildly dilated.  4. Right atrial size was mildly dilated.  5. The mitral valve is abnormal. Trivial mitral valve regurgitation. No evidence of mitral stenosis. Moderate mitral annular calcification.  6. The aortic valve is tricuspid. There is mild calcification of the aortic valve. There is mild thickening of the aortic valve. Aortic valve regurgitation is not visualized. Mild aortic valve sclerosis is present, with no evidence of aortic valve stenosis. FINDINGS  Left Ventricle: Left ventricular ejection fraction, by estimation, is 55 to 60%. The left ventricle has normal function. The left  ventricle has no regional wall motion abnormalities. The left ventricular internal cavity size was normal in size. There is  mild concentric left ventricular hypertrophy. Left ventricular diastolic parameters are indeterminate. Right Ventricle: The right ventricular size is mildly enlarged. Right vetricular wall thickness was not well visualized. Right ventricular systolic function is mildly reduced. There is normal pulmonary artery systolic pressure. The tricuspid regurgitant velocity is 2.51 m/s, and with an assumed right atrial pressure of 8 mmHg, the estimated right ventricular systolic pressure is 61.4 mmHg. Left Atrium: Left atrial size was mildly dilated. Right Atrium: Right atrial size was mildly dilated. Pericardium: There is no evidence of pericardial effusion. Mitral Valve: The mitral valve is abnormal. There is moderate thickening of the mitral valve leaflet(s). There is moderate calcification of the mitral valve leaflet(s). Moderate mitral annular calcification. Trivial mitral valve regurgitation. No evidence of mitral valve stenosis. Tricuspid Valve: The tricuspid valve is normal in structure. Tricuspid valve regurgitation is mild. Aortic Valve: The aortic valve is tricuspid. There is mild calcification of the aortic valve. There is mild thickening of the aortic valve. Aortic valve regurgitation is not visualized. Mild aortic valve sclerosis is present, with no evidence of aortic valve stenosis. Pulmonic Valve: The pulmonic valve was normal in structure. Pulmonic valve regurgitation is trivial. Aorta: The aortic root is normal in size and structure. IAS/Shunts: No atrial level shunt detected by color flow Doppler.  LEFT VENTRICLE PLAX 2D LVIDd:         2.70 cm LVIDs:         1.90 cm LV PW:         1.70 cm LV IVS:        1.40 cm LVOT diam:     1.70 cm LV SV:         42 LV SV Index:  26 LVOT Area:     2.27 cm  LV Volumes (MOD) LV vol d, MOD A2C: 25.7 ml LV vol d, MOD A4C: 35.5 ml LV vol s, MOD A2C: 6.8  ml LV vol s, MOD A4C: 13.9 ml LV SV MOD A2C:     18.9 ml LV SV MOD A4C:     35.5 ml LV SV MOD BP:      20.1 ml RIGHT VENTRICLE            IVC RV S prime:     9.36 cm/s  IVC diam: 2.70 cm TAPSE (M-mode): 1.6 cm LEFT ATRIUM             Index       RIGHT ATRIUM           Index LA diam:        4.60 cm 2.83 cm/m  RA Area:     14.70 cm LA Vol (A2C):   32.0 ml 19.70 ml/m RA Volume:   40.00 ml  24.63 ml/m LA Vol (A4C):   60.8 ml 37.44 ml/m LA Biplane Vol: 47.4 ml 29.18 ml/m  AORTIC VALVE             PULMONIC VALVE LVOT Vmax:   93.30 cm/s  PR End Diast Vel: 1.61 msec LVOT Vmean:  67.300 cm/s LVOT VTI:    0.186 m  AORTA Ao Root diam: 3.30 cm MITRAL VALVE                TRICUSPID VALVE MV Area (PHT): 4.21 cm     TR Peak grad:   25.2 mmHg MV Decel Time: 180 msec     TR Vmax:        251.00 cm/s MV E velocity: 102.00 cm/s                             SHUNTS                             Systemic VTI:  0.19 m                             Systemic Diam: 1.70 cm Gwyndolyn Kaufman MD Electronically signed by Gwyndolyn Kaufman MD Signature Date/Time: 04/23/2020/5:40:11 PM    Final    VAS [place] LOWER EXTREMITY VENOUS (DVT)  Result Date: 04/23/2020  Lower Venous DVT Study Indications: Increased bilateral edema.  Anticoagulation: Eliquis. Comparison       01-19-2020 Bilateral lower extremity venous was negative for Study:           DVT. Performing Technologist: Darlin Coco RDMS, RVT  Examination Guidelines: A complete evaluation includes B-mode imaging, spectral Doppler, color Doppler, and power Doppler as needed of all accessible portions of each vessel. Bilateral testing is considered an integral part of a complete examination. Limited examinations for reoccurring indications may be performed as noted. The reflux portion of the exam is performed with the patient in reverse Trendelenburg.  +---------+---------------+---------+-----------+----------+--------------+ RIGHT    CompressibilityPhasicitySpontaneityPropertiesThrombus  Aging +---------+---------------+---------+-----------+----------+--------------+ CFV      Full           Yes      Yes                                 +---------+---------------+---------+-----------+----------+--------------+  SFJ      Full                                                        +---------+---------------+---------+-----------+----------+--------------+ FV Prox  Full                                                        +---------+---------------+---------+-----------+----------+--------------+ FV Mid   Full                                                        +---------+---------------+---------+-----------+----------+--------------+ FV DistalFull                                                        +---------+---------------+---------+-----------+----------+--------------+ PFV      Full                                                        +---------+---------------+---------+-----------+----------+--------------+ POP      Full           Yes      Yes                                 +---------+---------------+---------+-----------+----------+--------------+ PTV      Full                                                        +---------+---------------+---------+-----------+----------+--------------+ PERO     Full                                                        +---------+---------------+---------+-----------+----------+--------------+   +---------+---------------+---------+-----------+----------+--------------+ LEFT     CompressibilityPhasicitySpontaneityPropertiesThrombus Aging +---------+---------------+---------+-----------+----------+--------------+ CFV      Full           Yes      Yes                                 +---------+---------------+---------+-----------+----------+--------------+ SFJ      Full                                                         +---------+---------------+---------+-----------+----------+--------------+  FV Prox  Full                                                        +---------+---------------+---------+-----------+----------+--------------+ FV Mid   Full                                                        +---------+---------------+---------+-----------+----------+--------------+ FV DistalFull                                                        +---------+---------------+---------+-----------+----------+--------------+ PFV      Full                                                        +---------+---------------+---------+-----------+----------+--------------+ POP      Full           Yes      Yes                                 +---------+---------------+---------+-----------+----------+--------------+ PTV      Full                                                        +---------+---------------+---------+-----------+----------+--------------+ PERO     Full                                                        +---------+---------------+---------+-----------+----------+--------------+     Summary: RIGHT: - There is no evidence of deep vein thrombosis in the lower extremity.  - No cystic structure found in the popliteal fossa. - Ultrasound characteristics of enlarged lymph nodes are noted in the groin.  LEFT: - There is no evidence of deep vein thrombosis in the lower extremity.  - No cystic structure found in the popliteal fossa.  *See table(s) above for measurements and observations. Electronically signed by Monica Martinez MD on 04/23/2020 at 3:44:20 PM.    Final     Microbiology: Recent Results (from the past 240 hour(s))  SARS CORONAVIRUS 2 (TAT 6-24 HRS) Nasopharyngeal Nasopharyngeal Swab     Status: None   Collection Time: 04/22/20 10:13 PM   Specimen: Nasopharyngeal Swab  Result Value Ref Range Status   SARS Coronavirus 2 NEGATIVE NEGATIVE Final    Comment:  (NOTE) SARS-CoV-2 target nucleic acids are NOT DETECTED.  The SARS-CoV-2 RNA is generally detectable in upper and lower respiratory specimens during the acute phase of infection. Negative  results do not preclude SARS-CoV-2 infection, do not rule out co-infections with other pathogens, and should not be used as the sole basis for treatment or other patient management decisions. Negative results must be combined with clinical observations, patient history, and epidemiological information. The expected result is Negative.  Fact Sheet for Patients: SugarRoll.be  Fact Sheet for Healthcare Providers: https://www.woods-mathews.com/  This test is not yet approved or cleared by the [place] FDA and  has been authorized for detection and/or diagnosis of SARS-CoV-2 by FDA under an Emergency Use Authorization (EUA). This EUA will remain  in effect (meaning this test can be used) for the duration of the COVID-19 declaration under Se ction 564(b)(1) of the Act, 21 U.S.C. section 360bbb-3(b)(1), unless the authorization is terminated or revoked sooner.  Performed at [facility], [place] [address], [place], [place] [address]      Labs: Basic Metabolic Panel: Recent Labs  Lab 04/23/20 [phone] 04/24/20 0459 04/25/20 0438 04/26/20 0252 04/27/20 0513  NA 136 133* 132* 131* 131*  K 3.2* 3.8 2.6* 3.4* 3.4*  CL 92* 92* 88* 88* 92*  CO2 31 29 33* 31 27  GLUCOSE 125* 130* 113* 115* 113*  BUN 48* 47* 45* 44* 41*  CREATININE 1.09* 1.17* 1.14* 1.14* 1.23*  CALCIUM 9.4 9.2 8.8* 9.1 9.0  MG 1.9  --  1.7  --   --    Liver Function Tests: Recent Labs  Lab 04/22/20 1756  AST 24  ALT 24  ALKPHOS 95  BILITOT 0.8  PROT 8.2*  ALBUMIN 3.2*   No results for input(s): LIPASE, AMYLASE in the last 168 hours. No results for input(s): AMMONIA in the last 168 hours. CBC: Recent Labs  Lab 04/22/20 1756  WBC 9.4  NEUTROABS 6.0  HGB 12.9  HCT 39.1   MCV 94.2  PLT 376   Cardiac Enzymes: No results for input(s): CKTOTAL, CKMB, CKMBINDEX, TROPONINI in the last 168 hours. BNP: BNP (last 3 results) Recent Labs    08/25/19 0824 04/22/20 2131  BNP 644.1* 908.0*    ProBNP (last 3 results) No results for input(s): PROBNP in the last 8760 hours.  CBG: Recent Labs  Lab 04/26/20 0620 04/26/20 1205 04/26/20 1709 04/26/20 2119 04/27/20 0551  GLUCAP 134* 138* 105* 111* 118*       Signed:  Dennard Vezina  [facility] 04/27/2020, 10:01 AM

## 2020-04-27 NOTE — Consult Note (Signed)
[place] Nurse Consult Note: Patient receiving care in [place]. Reason for Consult: "LE wounds" Wound type: NO wounds present, only dried, flaking skin to BLE and feet. Pressure Injury POA: Yes/No/NA Measurement: Wound bed: Drainage (amount, consistency, odor)  Periwound: Dressing procedure/placement/frequency:  Wash BLE with soap and water. Lightly rub the flaking areas to remove as much of the dried skin and crust as possible. Pat day. Apply [place] Moisturizing ointment (pink and white tube). Thank you for the consult.  Discussed plan of care with the patient.  [place] nurse will not follow at this time.  Please re-consult the [facility] team if needed.  Val Riles, RN, MSN, CWOCN, CNS-BC, pager [phone]

## 2020-04-27 NOTE — Care Management Important Message (Signed)
Important Message  Patient Details  Name: Dana Bradford MRN: [id number] Date of Birth: 1930-05-14   Medicare Important Message Given:  Yes     Shelda Altes 04/27/2020, 9:58 AM

## 2020-04-27 NOTE — Plan of Care (Signed)
  Problem: Education: Goal: Knowledge of General Education information will improve Description: Including pain rating scale, medication(s)/side effects and non-pharmacologic comfort measures Outcome: Adequate for Discharge   

## 2020-05-01 DIAGNOSIS — Z91018 Allergy to other foods: Secondary | ICD-10-CM | POA: Insufficient documentation

## 2020-05-01 DIAGNOSIS — L97909 Non-pressure chronic ulcer of unspecified part of unspecified lower leg with unspecified severity: Secondary | ICD-10-CM | POA: Insufficient documentation

## 2020-05-01 DIAGNOSIS — N179 Acute kidney failure, unspecified: Secondary | ICD-10-CM | POA: Insufficient documentation

## 2020-05-01 DIAGNOSIS — R0600 Dyspnea, unspecified: Secondary | ICD-10-CM | POA: Insufficient documentation

## 2020-05-01 DIAGNOSIS — D649 Anemia, unspecified: Secondary | ICD-10-CM | POA: Insufficient documentation

## 2020-05-01 DIAGNOSIS — G2581 Restless legs syndrome: Secondary | ICD-10-CM | POA: Insufficient documentation

## 2020-05-01 DIAGNOSIS — J189 Pneumonia, unspecified organism: Secondary | ICD-10-CM | POA: Insufficient documentation

## 2020-05-01 DIAGNOSIS — I10 Essential (primary) hypertension: Secondary | ICD-10-CM | POA: Insufficient documentation

## 2020-05-01 DIAGNOSIS — C4490 Unspecified malignant neoplasm of skin, unspecified: Secondary | ICD-10-CM | POA: Insufficient documentation

## 2020-05-01 DIAGNOSIS — I4891 Unspecified atrial fibrillation: Secondary | ICD-10-CM | POA: Insufficient documentation

## 2020-05-01 DIAGNOSIS — I4821 Permanent atrial fibrillation: Secondary | ICD-10-CM | POA: Insufficient documentation

## 2020-05-01 DIAGNOSIS — F32A Depression, unspecified: Secondary | ICD-10-CM | POA: Insufficient documentation

## 2020-05-01 DIAGNOSIS — I83009 Varicose veins of unspecified lower extremity with ulcer of unspecified site: Secondary | ICD-10-CM | POA: Insufficient documentation

## 2020-05-01 DIAGNOSIS — E119 Type 2 diabetes mellitus without complications: Secondary | ICD-10-CM | POA: Insufficient documentation

## 2020-05-01 DIAGNOSIS — F419 Anxiety disorder, unspecified: Secondary | ICD-10-CM | POA: Insufficient documentation

## 2020-05-01 DIAGNOSIS — H409 Unspecified glaucoma: Secondary | ICD-10-CM | POA: Insufficient documentation

## 2020-05-01 DIAGNOSIS — M1990 Unspecified osteoarthritis, unspecified site: Secondary | ICD-10-CM | POA: Insufficient documentation

## 2020-05-11 NOTE — Progress Notes (Unsigned)
Cardiology Office Note:    Date:  05/12/2020   ID:  Dana Bradford, DOB 1930/10/01, MRN [id number]  PCP:  Dana Resides, MD  Cardiologist:  Dana More, MD    Referring MD: Dana Resides, MD    ASSESSMENT:    1. Chronic diastolic heart failure (Dana Bradford)   Bradford. Hypertensive heart and kidney disease with chronic diastolic congestive heart failure and stage 3 chronic kidney disease, unspecified whether stage 3a or 3b CKD (HCC)   3. Persistent atrial fibrillation (Dana Bradford)   4. Chronic anticoagulation    PLAN:    In order of problems listed above:  1. She is markedly improved with a net diuresis approaching 30 pounds from hospitalization.  At this time I think we can back off to take metolazone once a week reduced dose along with her loop diuretic provided we can date weigh her daily and have close clinical supervision.  Her calcium channel blocker is not needed controlled heart rate low blood pressure may be contributing to her edema and will discontinue.  I asked her to also take potassium 20 mEq daily. Bradford. See above changes in diuretic and potassium 3. Rate controlled discontinue calcium channel blocker favoring edema continue beta-blocker and reduce her anticoagulant with age and renal dysfunction   Next appointment: 6 weeks   Medication Adjustments/Labs and Tests Ordered: Current medicines are reviewed at length with the patient today.  Concerns regarding medicines are outlined above.  Orders Placed This Encounter  Procedures  . EKG 12-Lead   Meds ordered this encounter  Medications  . apixaban (ELIQUIS) Bradford.5 MG TABS tablet    Sig: Take 1 tablet (Bradford.5 mg total) by mouth Bradford (two) times daily.    Dispense:  180 tablet    Refill:  3  . metolazone (ZAROXOLYN) Bradford.5 MG tablet    Sig: Take 1 tablet (Bradford.5 mg total) by mouth daily.    Dispense:  90 tablet    Refill:  3  . potassium chloride SA (KLOR-CON) 20 MEQ tablet    Sig: Take 1 tablet (20 mEq total) by mouth daily.    Dispense:  90  tablet    Refill:  3    Chief Complaint  Patient presents with  . Congestive Heart Failure  . Atrial Fibrillation    To establish care    History of Present Illness:    Dana Bradford is a 87 y.o. female with a hx of tonic atrial fibrillation and heart failure referred after [facility] station and discharged 04/27/2020   She was seen at [facility] 04/07/2020 outpatient visit with persistent atrial fibrillation worsened edema and shortness of breath.  He was given metolazone and continued on a loop diuretic she has no specific diagnosis of heart failure.  Cardiogram and [facility] February 2021 showing EF 55 to 60% normal size and wall thickness Doppler parameters of indeterminant for filling pressures she had mild mitral regurgitation and mild left atrial enlargement.  He was admitted to [facility] 04/22/2020 [facility] 04/27/2020 with heart failure decompensation.  Chest x-ray showed vascular congestion BNP was elevated at 900 and repeat echocardiogram showed preserved ejection fraction with mild right ventricular dysfunction and enlargement and biatrial enlargement.  She continues in atrial fibrillation with a controlled rate anticoagulated with a calcium channel blocker metoprolol and had mild renal dysfunction on admission creatinine 1.54.  Post Bradford labs 04/30/2020 shows potassium 3.0 BUN 58 creatinine 1.35 GFR 38 cc/min  Post discharge she continues to do well with  very little edema but marked stasis changes of her legs.  She takes care of her self and has a neighbor looks after and brings her to the office.  She been seen by her PCP and I am asked whether she should take a higher dose of potassium and whether you should decrease her diuretics.  She is lost close to 30 pounds.  No complaints of shortness of breath chest pain palpitation or syncope. Past Medical History:  Diagnosis Date  . Acute kidney failure (Dana Bradford) 2016 to 2017 due to vacomycin   no kidney issues now  .  Anemia   . Anxiety   . Arthritis   . Atrial fibrillation (Dana Bradford)   . Depression   . Diabetes mellitus without complication (Dana Bradford)   . Dyspnea   . Food allergy    Fish (Hives)   . Glaucoma   . Hypertension   . Impaired fasting glucose   . Other specified acquired hypothyroidism   . Pneumonia   . Restless leg   . Restless leg   . Skin cancer    Forehead  . 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 Venous stasis ulcer (Dana Bradford)     Past Surgical History:  Procedure Laterality Date  . ABDOMINAL HYSTERECTOMY  1993   Uterine Prolapse partial  . MASTECTOMY  1974   right breast not sure if cancer 1960's  . REPLACEMENT TOTAL KNEE BILATERAL Bilateral   . SMALL TOE RIGHT FOOT REMOVED  04/2017   DUE TO INFECTION  . Spleenectomy  2000  . TOTAL HIP ARTHROPLASTY Right 02/28/2018   Procedure: RIGHT TOTAL HIP ARTHROPLASTY ANTERIOR APPROACH;  Surgeon: Gaynelle Arabian, MD;  Location: [facility];  Service: Orthopedics;  Laterality: Right;  163min  . TOTAL HIP ARTHROPLASTY Left 01/22/2020   Procedure: TOTAL HIP ARTHROPLASTY ANTERIOR APPROACH;  Surgeon: Gaynelle Arabian, MD;  Location: [facility];  Service: Orthopedics;  Laterality: Left;  165min  . TOTAL KNEE ARTHROPLASTY WITH REVISION COMPONENTS      Current Medications: Current Meds  Medication Sig  . acetaminophen (TYLENOL) 500 MG tablet Take Bradford,000 mg by mouth daily as needed for mild pain or headache.   Marland Kitchen apixaban (ELIQUIS) Bradford.5 MG TABS tablet Take 1 tablet (Bradford.5 mg total) by mouth Bradford (two) times daily.  . brimonidine (ALPHAGAN) 0.Bradford % ophthalmic solution Place 1 drop into both eyes Bradford (two) times daily.  . Cholecalciferol (VITAMIN D3) 50 MCG (2000 UT) TABS Take Bradford,000 Units by mouth daily.  . famotidine (PEPCID) 40 MG tablet Take 40 mg by mouth at bedtime.   . ferrous sulfate 325 (65 FE) MG tablet Take 325 mg by mouth Bradford (two) times daily.  . furosemide (LASIX) 40 MG tablet Take 40 mg by mouth daily.  Marland Kitchen latanoprost (XALATAN)  0.005 % ophthalmic solution Place 1 drop into both eyes at bedtime.  Marland Kitchen levothyroxine (SYNTHROID, LEVOTHROID) 88 MCG tablet Take 88 mcg by mouth daily before breakfast.  . metFORMIN (GLUCOPHAGE) 500 MG tablet Take 500 mg by mouth daily.   . metolazone (ZAROXOLYN) Bradford.5 MG tablet Take 1 tablet (Bradford.5 mg total) by mouth daily.  . metoprolol succinate (TOPROL-XL) 50 MG 24 hr tablet Take 50 mg by mouth daily.  Marland Kitchen omeprazole (PRILOSEC) 40 MG capsule Take 1 capsule by mouth daily.  . potassium chloride SA (KLOR-CON) 20 MEQ tablet Take 1 tablet (20 mEq total) by mouth daily.  . pramipexole (MIRAPEX) 1 MG tablet Take 1 mg  by mouth at bedtime.  . simvastatin (ZOCOR) 20 MG tablet Take 20 mg by mouth every evening.  . timolol (BETIMOL) 0.25 % ophthalmic solution Place 1 drop into both eyes Bradford (two) times daily.  Marland Kitchen triamcinolone ointment (KENALOG) 0.1 % Apply 1 application topically Bradford (two) times daily as needed (skin irritation.).  Marland Kitchen vitamin C (ASCORBIC ACID) 500 MG tablet Take 500 mg by mouth Bradford (two) times daily.  . [DISCONTINUED] diltiazem (TIAZAC) 180 MG 24 hr capsule Take 180 mg by mouth daily.  . [DISCONTINUED] ELIQUIS 5 MG TABS tablet Take 5 mg by mouth Bradford (two) times daily.  . [DISCONTINUED] metolazone (ZAROXOLYN) 5 MG tablet Take 5 mg by mouth Bradford (two) times a week.  . [DISCONTINUED] metoprolol succinate (TOPROL-XL) 50 MG 24 hr tablet Take 1 tablet by mouth daily.  . [DISCONTINUED] Potassium Chloride ER 20 MEQ TBCR Take 20 mEq by mouth daily.     Allergies:   Arthrotec [diclofenac-misoprostol], Doxycycline, Latex, Amlodipine, Celebrex [celecoxib], Vancomycin, Diclofenac, and Shellfish allergy   Social History   Socioeconomic History  . Marital status: Widowed    Spouse name: Not on file  . Number of children: Bradford  . Years of education: 41  . Highest education level: Not on file  Occupational History  . Occupation: Retired   Tobacco Use  . Smoking status: Former Smoker    Packs/day: 1.00    Years:  30.00    Pack years: 30.00    Types: Cigarettes  . Smokeless tobacco: Never Used  . Tobacco comment: qiot 50 yrs ago  Vaping Use  . Vaping Use: Never used  Substance and Sexual Activity  . Alcohol use: Not Currently  . Drug use: No  . Sexual activity: Not on file    Comment: Hysterectomy  Other Topics Concern  . Not on file  Social History Narrative   Marital Status: Widowed    Children:  Daughter Barbaraann Share) Son Jonnell Hentges)   Pets: None    Living Situation: Lives alone    Occupation: Retired    Education: Programmer, systems    Tobacco:  She quit smoking 35 years ago after having smoked 1 ppd for 20 years.      Alcohol Use:  One glass of wine twice a week     Drug Use:  None   Diet:  Regular   Exercise:  None   Hobbies: Crafting, Gardening.             Social Determinants of Health   Financial Resource Strain: Not on file  Food Insecurity: Not on file  Transportation Needs: Not on file  Physical Activity: Not on file  Stress: Not on file  Social Connections: Not on file     Family History: The patient's family history includes Cancer in her mother; Heart disease in her father; Hyperlipidemia in her father. ROS:   Please see the history of present illness.    All other systems reviewed and are negative.  EKGs/Labs/Other Studies Reviewed:    The following studies were reviewed today:  EKG:  EKG 04/23/2020 independently reviewed atrial fibrillation controlled rate nonspecific ST and T abnormality  Recent Labs: 3/Bradford/2022: ALT 24; B Natriuretic Peptide 908.0; Hemoglobin 12.9; Platelets 376 04/23/2020: TSH 0.556 04/25/2020: Magnesium 1.7 04/27/2020: BUN 41; Creatinine, Ser 1.23; Potassium 3.4; Sodium 131  Recent Lipid Panel    Component Value Date/Time   CHOL 187 02/01/2012 0000    Physical Exam:    VS:  BP 95/63  Pulse 79   Ht 4\' 10"  (1.473 m)   Wt 147 lb 0.6 oz (66.7 kg)   SpO2 99%   BMI 30.73 kg/m     Wt Readings from Last 3 Encounters:   05/12/20 147 lb 0.6 oz (66.7 kg)  04/27/20 140 lb 5.Bradford oz (63.6 kg)  01/19/20 152 lb 12.5 oz (69.3 kg)     GEN: She appears frail well nourished, well developed in no acute distress HEENT: Normal NECK: No JVD; No carotid bruits LYMPHATICS: No lymphadenopathy CARDIAC: S1 is variable rhythm is irregular no murmurs, rubs, gallops RESPIRATORY:  Clear to auscultation without rales, wheezing or rhonchi  ABDOMEN: Soft, non-tender, non-distended MUSCULOSKELETAL:  No edema; No deformity  SKIN: Warm and dry NEUROLOGIC:  Alert and oriented x 3 PSYCHIATRIC:  Normal affect    Signed, Dana More, MD  05/12/2020 3:22 PM    [facility]

## 2020-05-12 ENCOUNTER — Encounter: Payer: Self-pay | Admitting: Cardiology

## 2020-05-12 ENCOUNTER — Ambulatory Visit (INDEPENDENT_AMBULATORY_CARE_PROVIDER_SITE_OTHER): Payer: Medicare Other | Admitting: Cardiology

## 2020-05-12 ENCOUNTER — Other Ambulatory Visit: Payer: Self-pay

## 2020-05-12 VITALS — BP 95/63 | HR 79 | Ht <= 58 in | Wt 147.0 lb

## 2020-05-12 DIAGNOSIS — I5032 Chronic diastolic (congestive) heart failure: Secondary | ICD-10-CM

## 2020-05-12 DIAGNOSIS — Z7901 Long term (current) use of anticoagulants: Secondary | ICD-10-CM | POA: Diagnosis not present

## 2020-05-12 DIAGNOSIS — N183 Chronic kidney disease, stage 3 unspecified: Secondary | ICD-10-CM

## 2020-05-12 DIAGNOSIS — I4819 Other persistent atrial fibrillation: Secondary | ICD-10-CM | POA: Diagnosis not present

## 2020-05-12 DIAGNOSIS — I130 Hypertensive heart and chronic kidney disease with heart failure and stage 1 through stage 4 chronic kidney disease, or unspecified chronic kidney disease: Secondary | ICD-10-CM

## 2020-05-12 MED ORDER — METOLAZONE 2.5 MG PO TABS: 2.5000 mg | ORAL_TABLET | Freq: Every day | ORAL | 3 refills | Status: DC

## 2020-05-12 MED ORDER — APIXABAN 2.5 MG PO TABS: 2.5000 mg | ORAL_TABLET | Freq: Two times a day (BID) | ORAL | 3 refills | Status: DC

## 2020-05-12 MED ORDER — METOLAZONE 2.5 MG PO TABS: 2.5000 mg | ORAL_TABLET | ORAL | 3 refills | Status: DC

## 2020-05-12 MED ORDER — POTASSIUM CHLORIDE CRYS ER 20 MEQ PO TBCR: 20.0000 meq | EXTENDED_RELEASE_TABLET | Freq: Every day | ORAL | 3 refills | Status: DC

## 2020-05-12 NOTE — Patient Instructions (Addendum)
Medication Instructions:  Your physician has recommended you make the following change in your medication:  DECREASE: Eliquis 2.5 mg take one tablet by mouth twice daily.  DECREASE: Metolazone 2.5 mg take once weekly.  INCREASE: Potassium to 20 meq daily.  STOP: Diltiazem *If you need a refill on your cardiac medications before your next appointment, please call your pharmacy*   Lab Work: None If you have labs (blood work) drawn today and your tests are completely normal, you will receive your results only by: Marland Kitchen MyChart Message (if you have MyChart) OR . A paper copy in the mail If you have any lab test that is abnormal or we need to change your treatment, we will call you to review the results.   Testing/Procedures: NOne   Follow-Up: At [facility], you and your health needs are our priority.  As part of our continuing mission to provide you with exceptional heart care, we have created designated Provider Care Teams.  These Care Teams include your primary Cardiologist (physician) and Advanced Practice Providers (APPs -  Physician Assistants and Nurse Practitioners) who all work together to provide you with the care you need, when you need it.  We recommend signing up for the patient portal called "MyChart".  Sign up information is provided on this After Visit Summary.  MyChart is used to connect with patients for Virtual Visits (Telemedicine).  Patients are able to view lab/test results, encounter notes, upcoming appointments, etc.  Non-urgent messages can be sent to your provider as well.   To learn more about what you can do with MyChart, go to [email or web address].    Your next appointment:   6 week(s)  The format for your next appointment:   In Person  Provider:   Shirlee More, MD   Other Instructions

## 2020-05-26 DIAGNOSIS — N142 Nephropathy induced by unspecified drug, medicament or biological substance: Secondary | ICD-10-CM | POA: Insufficient documentation

## 2020-05-26 DIAGNOSIS — T3695XA Adverse effect of unspecified systemic antibiotic, initial encounter: Secondary | ICD-10-CM | POA: Insufficient documentation

## 2020-05-26 HISTORY — DX: Nephropathy induced by unspecified drug, medicament or biological substance: N14.2

## 2020-06-22 DIAGNOSIS — F32A Depression, unspecified: Secondary | ICD-10-CM | POA: Insufficient documentation

## 2020-06-22 DIAGNOSIS — F419 Anxiety disorder, unspecified: Secondary | ICD-10-CM | POA: Insufficient documentation

## 2020-07-01 ENCOUNTER — Encounter: Payer: Self-pay | Admitting: Cardiology

## 2020-07-01 ENCOUNTER — Ambulatory Visit (INDEPENDENT_AMBULATORY_CARE_PROVIDER_SITE_OTHER): Payer: Medicare Other | Admitting: Cardiology

## 2020-07-01 ENCOUNTER — Other Ambulatory Visit: Payer: Self-pay

## 2020-07-01 VITALS — BP 132/88 | HR 118 | Ht <= 58 in | Wt 146.0 lb

## 2020-07-01 DIAGNOSIS — I130 Hypertensive heart and chronic kidney disease with heart failure and stage 1 through stage 4 chronic kidney disease, or unspecified chronic kidney disease: Secondary | ICD-10-CM

## 2020-07-01 DIAGNOSIS — I4819 Other persistent atrial fibrillation: Secondary | ICD-10-CM | POA: Diagnosis not present

## 2020-07-01 DIAGNOSIS — N183 Chronic kidney disease, stage 3 unspecified: Secondary | ICD-10-CM

## 2020-07-01 DIAGNOSIS — I5032 Chronic diastolic (congestive) heart failure: Secondary | ICD-10-CM | POA: Diagnosis not present

## 2020-07-01 DIAGNOSIS — R0602 Shortness of breath: Secondary | ICD-10-CM

## 2020-07-01 DIAGNOSIS — Z7901 Long term (current) use of anticoagulants: Secondary | ICD-10-CM

## 2020-07-01 MED ORDER — METOPROLOL SUCCINATE ER 50 MG PO TB24: ORAL_TABLET | ORAL | 3 refills | Status: DC

## 2020-07-01 NOTE — Progress Notes (Signed)
Cardiology Office Note:    Date:  07/01/2020   ID:  Dana Bradford, DOB 09/16/1930, MRN [id number]  PCP:  Jonathon Resides, MD  Cardiologist:  Shirlee More, MD    Referring MD: Jonathon Resides, MD    ASSESSMENT:    1. Chronic diastolic heart failure ([place])   2. Hypertensive heart and kidney disease with chronic diastolic congestive heart failure and stage 3 chronic kidney disease, unspecified whether stage 3a or 3b CKD (HCC)   3. Persistent atrial fibrillation ([place])   4. Chronic anticoagulation    PLAN:    In order of problems listed above:  1. Overall she is doing quite well her heart failure is nicely compensated she has no volume overload we will continue her current loop and distal diuretic.  Recheck renal function today with her CKD I am concerned about atrial fibrillation the rate is a little bit more rapid than we would like we will increase the dose of her beta-blocker trend at home. 2. Stable BP at target we will increase beta-blocker for heart rate control 3. Increase beta-blocker continue reduced dose anticoagulant with age CKD   Next appointment: 3 months   Medication Adjustments/Labs and Tests Ordered: Current medicines are reviewed at length with the patient today.  Concerns regarding medicines are outlined above.  No orders of the defined types were placed in this encounter.  No orders of the defined types were placed in this encounter.   Chief Complaint  Patient presents with  . Follow-up  . Congestive Heart Failure    History of Present Illness:    Dana Bradford is a 87 y.o. female with a hx of diastolic heart failure hypertensive heart disease CKD stage III persistent atrial fibrillation and anticoagulation last seen 05/12/2020.   She was seen at [facility] health 04/07/2020 outpatient visit with persistent atrial fibrillation worsened edema and shortness of breath.  She was given metolazone and continued on a loop diuretic she has no specific diagnosis of  heart failure.  EchoCardiogram at [facility] February 2021 showing EF 55 to 60% normal size and wall thickness Doppler parameters of indeterminant for filling pressures she had mild mitral regurgitation and mild left atrial enlargement.   She was admitted to [facility] 04/22/2020 2 04/27/2020 with heart failure decompensation.  Chest x-ray showed vascular congestion BNP was elevated at 900 and repeat echocardiogram showed preserved ejection fraction with mild right ventricular dysfunction and enlargement and biatrial enlargement.  She continues in atrial fibrillation with a controlled rate anticoagulated with a calcium channel blocker metoprolol and had mild renal dysfunction on admission creatinine 1.54.   Post hospital labs 04/30/2020 shows potassium 3.0 BUN 58 creatinine 1.35 GFR 38 cc/min  Compliance with diet, lifestyle and medications: Yes  She is meticulous for medications self-care weights daily stable weight at home 1 to 2 pounds and tracks heart rate which was more rapid 130 bpm.  She has slowly steadily improved strength and endurance thought process and is able to do self-care and some light gardening work.  No edema orthopnea chest pain palpitation or syncope and tolerates her anticoagulant without bleeding complication. Past Medical History:  Diagnosis Date  . Acute kidney failure ([place]) 2016 to 2017 due to vacomycin   no kidney issues now  . Anemia   . Anxiety   . Arthritis   . Atrial fibrillation ([place])   . Depression   . Diabetes mellitus without complication ([place])   . Dyspnea   . Food allergy  Fish (Hives)   . Glaucoma   . Hypertension   . Impaired fasting glucose   . Other specified acquired hypothyroidism   . Pneumonia   . Restless leg   . Restless leg   . Skin cancer    Forehead  . 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 Venous stasis ulcer ([place])     Past Surgical History:  Procedure Laterality Date  . ABDOMINAL  HYSTERECTOMY  1993   Uterine Prolapse partial  . MASTECTOMY  1974   right breast not sure if cancer 1960's  . REPLACEMENT TOTAL KNEE BILATERAL Bilateral   . SMALL TOE RIGHT FOOT REMOVED  04/2017   DUE TO INFECTION  . Spleenectomy  2000  . TOTAL HIP ARTHROPLASTY Right 02/28/2018   Procedure: RIGHT TOTAL HIP ARTHROPLASTY ANTERIOR APPROACH;  Surgeon: Gaynelle Arabian, MD;  Location: [facility];  Service: Orthopedics;  Laterality: Right;  1102min  . TOTAL HIP ARTHROPLASTY Left 01/22/2020   Procedure: TOTAL HIP ARTHROPLASTY ANTERIOR APPROACH;  Surgeon: Gaynelle Arabian, MD;  Location: [facility];  Service: Orthopedics;  Laterality: Left;  168min  . TOTAL KNEE ARTHROPLASTY WITH REVISION COMPONENTS      Current Medications: Current Meds  Medication Sig  . apixaban (ELIQUIS) 2.5 MG TABS tablet Take 1 tablet (2.5 mg total) by mouth 2 (two) times daily.  . brimonidine (ALPHAGAN) 0.2 % ophthalmic solution Place 1 drop into both eyes 2 (two) times daily.  . Cholecalciferol (VITAMIN D3) 50 MCG (2000 UT) TABS Take 2,000 Units by mouth daily.  . famotidine (PEPCID) 40 MG tablet Take 40 mg by mouth at bedtime.   . ferrous sulfate 325 (65 FE) MG tablet Take 325 mg by mouth 2 (two) times daily.  . furosemide (LASIX) 40 MG tablet Take 40 mg by mouth daily.  Marland Kitchen latanoprost (XALATAN) 0.005 % ophthalmic solution Place 1 drop into both eyes at bedtime.  Marland Kitchen levothyroxine (SYNTHROID, LEVOTHROID) 88 MCG tablet Take 88 mcg by mouth daily before breakfast.  . metFORMIN (GLUCOPHAGE) 500 MG tablet Take 500 mg by mouth daily.   . metolazone (ZAROXOLYN) 2.5 MG tablet Take 1 tablet (2.5 mg total) by mouth once a week.  . metoprolol succinate (TOPROL-XL) 50 MG 24 hr tablet Take 50 mg by mouth daily.  Marland Kitchen omeprazole (PRILOSEC) 40 MG capsule Take 1 capsule by mouth daily.  . potassium chloride SA (KLOR-CON) 20 MEQ tablet Take 1 tablet (20 mEq total) by mouth daily.  . pramipexole (MIRAPEX) 1 MG tablet Take 1 mg by mouth at bedtime.  .  simvastatin (ZOCOR) 20 MG tablet Take 20 mg by mouth every evening.  . timolol (BETIMOL) 0.25 % ophthalmic solution Place 1 drop into both eyes 2 (two) times daily.  Marland Kitchen triamcinolone ointment (KENALOG) 0.1 % Apply 1 application topically 2 (two) times daily as needed (skin irritation.).  Marland Kitchen vitamin C (ASCORBIC ACID) 500 MG tablet Take 500 mg by mouth 2 (two) times daily.     Allergies:   Arthrotec [diclofenac-misoprostol], Doxycycline, Latex, Amlodipine, Celebrex [celecoxib], Vancomycin, Diclofenac, and Shellfish allergy   Social History   Socioeconomic History  . Marital status: Widowed    Spouse name: Not on file  . Number of children: 2  . Years of education: 40  . Highest education level: Not on file  Occupational History  . Occupation: Retired   Tobacco Use  . Smoking status: Former Smoker    Packs/day: 1.00    Years: 30.00  Pack years: 30.00    Types: Cigarettes  . Smokeless tobacco: Never Used  . Tobacco comment: qiot 50 yrs ago  Vaping Use  . Vaping Use: Never used  Substance and Sexual Activity  . Alcohol use: Not Currently  . Drug use: No  . Sexual activity: Not on file    Comment: Hysterectomy  Other Topics Concern  . Not on file  Social History Narrative   Marital Status: Widowed    Children:  Daughter Barbaraann Share) Son Faythe Heitzenrater)   Pets: None    Living Situation: Lives alone    Occupation: Retired    Education: Programmer, systems    Tobacco:  She quit smoking 35 years ago after having smoked 1 ppd for 20 years.      Alcohol Use:  One glass of wine twice a week     Drug Use:  None   Diet:  Regular   Exercise:  None   Hobbies: Crafting, Gardening.             Social Determinants of Health   Financial Resource Strain: Not on file  Food Insecurity: Not on file  Transportation Needs: Not on file  Physical Activity: Not on file  Stress: Not on file  Social Connections: Not on file     Family History: The patient's family history includes  Cancer in her mother; Heart disease in her father; Hyperlipidemia in her father. ROS:   Please see the history of present illness.    All other systems reviewed and are negative.  EKGs/Labs/Other Studies Reviewed:    The following studies were reviewed today:   Recent Labs: 04/22/2020: ALT 24; B Natriuretic Peptide 908.0; Hemoglobin 12.9; Platelets 376 04/23/2020: TSH 0.556 04/25/2020: Magnesium 1.7 04/27/2020: BUN 41; Creatinine, Ser 1.23; Potassium 3.4; Sodium 131  Recent Lipid Panel    Component Value Date/Time   CHOL 187 02/01/2012 0000    Physical Exam:    VS:  BP 132/88 (BP Location: Left Arm, Patient Position: Sitting, Cuff Size: Normal)   Pulse (!) 118   Ht 4\' 10"  (1.473 m)   Wt 146 lb (66.2 kg)   SpO2 97%   BMI 30.51 kg/m     Wt Readings from Last 3 Encounters:  07/01/20 146 lb (66.2 kg)  05/12/20 147 lb 0.6 oz (66.7 kg)  04/27/20 140 lb 5.2 oz (63.6 kg)     GEN: She still looks frail well nourished, well developed in no acute distress HEENT: Normal NECK: No JVD; No carotid bruits LYMPHATICS: No lymphadenopathy CARDIAC: Irregular rhythm variable first heart sound, no murmurs, rubs, gallops RESPIRATORY:  Clear to auscultation without rales, wheezing or rhonchi  ABDOMEN: Soft, non-tender, non-distended MUSCULOSKELETAL:  No edema; No deformity  SKIN: Warm and dry NEUROLOGIC:  Alert and oriented x 3 PSYCHIATRIC:  Normal affect    Signed, Shirlee More, MD  07/01/2020 3:34 PM    [facility]

## 2020-07-01 NOTE — Patient Instructions (Signed)
Medication Instructions:  Your physician has recommended you make the following change in your medication:  INCREASE: TOPROL XL Take one tablet by mouth daily in the morning and 1/2 tablet by mouth daily in the evening.   *If you need a refill on your cardiac medications before your next appointment, please call your pharmacy*   Lab Work: Your physician recommends that you return for lab work in: TODAY BMP, ProBNP If you have labs (blood work) drawn today and your tests are completely normal, you will receive your results only by: Marland Kitchen MyChart Message (if you have MyChart) OR . A paper copy in the mail If you have any lab test that is abnormal or we need to change your treatment, we will call you to review the results.   Testing/Procedures: None   Follow-Up: At [facility], you and your health needs are our priority.  As part of our continuing mission to provide you with exceptional heart care, we have created designated Provider Care Teams.  These Care Teams include your primary Cardiologist (physician) and Advanced Practice Providers (APPs -  Physician Assistants and Nurse Practitioners) who all work together to provide you with the care you need, when you need it.  We recommend signing up for the patient portal called "MyChart".  Sign up information is provided on this After Visit Summary.  MyChart is used to connect with patients for Virtual Visits (Telemedicine).  Patients are able to view lab/test results, encounter notes, upcoming appointments, etc.  Non-urgent messages can be sent to your provider as well.   To learn more about what you can do with MyChart, go to [email or web address].    Your next appointment:   3 month(s)  The format for your next appointment:   In Person  Provider:   Shirlee More, MD   Other Instructions

## 2020-07-02 LAB — PRO B NATRIURETIC PEPTIDE: NT-Pro BNP: 2179 pg/mL — ABNORMAL HIGH (ref 0–738)

## 2020-07-02 LAB — BASIC METABOLIC PANEL
BUN/Creatinine Ratio: 28 (ref 12–28)
BUN: 26 mg/dL (ref 8–27)
CO2: 26 mmol/L (ref 20–29)
Calcium: 9.6 mg/dL (ref 8.7–10.3)
Chloride: 101 mmol/L (ref 96–106)
Creatinine, Ser: 0.92 mg/dL (ref 0.57–1.00)
Glucose: 99 mg/dL (ref 65–99)
Potassium: 3.6 mmol/L (ref 3.5–5.2)
Sodium: 143 mmol/L (ref 134–144)
eGFR: 60 mL/min/{1.73_m2} (ref >59)

## 2020-07-07 ENCOUNTER — Telehealth: Payer: Self-pay

## 2020-07-07 NOTE — Telephone Encounter (Signed)
Left message on patients voicemail to please return our call.   

## 2020-07-08 ENCOUNTER — Telehealth: Payer: Self-pay

## 2020-07-08 NOTE — Telephone Encounter (Signed)
Left message on patients voicemail to please return our call.   

## 2020-07-09 ENCOUNTER — Telehealth: Payer: Self-pay

## 2020-07-09 NOTE — Telephone Encounter (Signed)
Spoke with patient regarding results and recommendation.  Patient verbalizes understanding and is agreeable to plan of care. Advised patient to call back with any issues or concerns.  

## 2020-10-20 ENCOUNTER — Ambulatory Visit: Payer: Medicare Other | Admitting: Cardiology

## 2020-11-30 ENCOUNTER — Ambulatory Visit
Admission: EM | Admit: 2020-11-30 | Discharge: 2020-11-30 | Disposition: A | Discharge status: Home / Self Care | Payer: Medicare Other | Attending: Emergency Medicine | Admitting: Emergency Medicine

## 2020-11-30 ENCOUNTER — Other Ambulatory Visit: Payer: Self-pay

## 2020-11-30 DIAGNOSIS — R300 Dysuria: Secondary | ICD-10-CM

## 2020-11-30 LAB — POCT URINALYSIS DIP (MANUAL ENTRY)
Bilirubin, UA: NEGATIVE
Blood, UA: NEGATIVE
Glucose, UA: NEGATIVE mg/dL
Ketones, POC UA: NEGATIVE mg/dL
Nitrite, UA: NEGATIVE
Protein Ur, POC: NEGATIVE mg/dL
Spec Grav, UA: 1.025 (ref 1.010–1.025)
Urobilinogen, UA: 0.2 E.U./dL
pH, UA: 5.5 (ref 5.0–8.0)

## 2020-11-30 MED ORDER — SULFAMETHOXAZOLE-TRIMETHOPRIM 800-160 MG PO TABS: 1.0000 | ORAL_TABLET | Freq: Two times a day (BID) | ORAL | 0 refills | Status: AC

## 2020-11-30 NOTE — Discharge Instructions (Addendum)
Your urinalysis today revealed cloudy urine.  As we discussed, I recommend that we treat you empirically for presumed urinary tract infection with an antibiotic called Bactrim.  Please take 1 tablet twice daily, morning and evening, for the next 5 days.  In the meantime, we will be sending your urine to the laboratory for culture to find the specific bacterial species causing the infection.  You will be notified of this result once is received, this takes usually 3 to 5 days.  If you have worsening symptoms, is recommended that he present to the emergency room for further emergency evaluation as this could be concerning for the infection leaving the bladder and moving towards the kidneys.  Thank you for your visit to urgent care today.

## 2020-11-30 NOTE — ED Provider Notes (Signed)
[facility]    CSN: [id number] Arrival date & time: 11/30/20  1514      History   Chief Complaint Chief Complaint  Patient presents with   Urinary Frequency    HPI Dana Bradford is a 87 y.o. female.   Patient is here with her daughter today.  Patient states she is having irritative urinary outflow tract symptoms including burning with urination, increased frequency of urination and sometimes incomplete emptying, sometimes incontinence of urine.  Patient denies fever, flank pain, pelvic pressure.  Patient states her current symptoms have been going on for few months, states she has been patiently waiting for them to go away, states that when she began having incontinence of urine she decided to come and have herself evaluated.  Patient's daughter states that she works at a veterinary clinic, states she obtained a sterile urine sample cup from her clinic, brings with her today a sample of her mother's urine.  Today is unremarkable other than her urine appears cloudy.  Daughter also states that patient has not been experiencing any signs of altered mental status.  The history is provided by the patient.   Past Medical History:  Diagnosis Date   Acute kidney failure ([place]) 2016 to 2017 due to vacomycin   no kidney issues now   Anemia    Anxiety    Arthritis    Atrial fibrillation ([place])    Depression    Diabetes mellitus without complication ([place])    Dyspnea    Food allergy    Fish (Hives)    Glaucoma    Hypertension    Impaired fasting glucose    Other specified acquired hypothyroidism    Pneumonia    Restless leg    Restless leg    Skin cancer    Forehead   Stroke ([place]) 05/2017   ocular stoke on plavix , right eye some vision loss   Venous stasis ulcer ([place])     Patient Active Problem List   Diagnosis Date Noted   Depression    Anxiety    Antibiotic-induced nephrotoxicity 05/26/2020   Chronic anticoagulation 05/12/2020   Venous stasis ulcer ([place])     Skin cancer    Restless leg    Pneumonia    Hypertension    Glaucoma    Food allergy    Dyspnea    Diabetes mellitus without complication (HCC)    Atrial fibrillation (HCC)    Arthritis    Anemia    Acute kidney failure ([place])    CHF exacerbation ([place]) 04/23/2020   Acute CHF (congestive heart failure) ([place]) 04/22/2020   Cellulitis of lower extremity 04/22/2020   ARF (acute renal failure) ([place]) 04/22/2020   CHF (congestive heart failure) ([place]) 04/22/2020   History of revision of total replacement of left hip joint 01/31/2020   Dehydration 01/19/2020   Hypotension 01/19/2020   Hypothyroidism 01/19/2020   Hyperlipidemia 01/19/2020   AF (paroxysmal atrial fibrillation) ([place]) 01/19/2020   Left leg cellulitis 01/18/2020   Degeneration of lumbar intervertebral disc 12/02/2019   Degenerative spondylolisthesis 12/02/2019   Scoliosis deformity of spine 12/02/2019   Pain in left knee 11/20/2019   Back pain 08/24/2019   Generalized weakness 08/24/2019   Chronic venous insufficiency 03/01/2019   Diabetic ulcer of toe of right foot associated with type 2 diabetes mellitus, with fat layer exposed ([place]) 02/08/2019   Venous stasis dermatitis of left lower extremity 06/05/2018   History of total hip replacement, right 04/02/2018   OA (osteoarthritis)  of hip 02/28/2018   Pain in joint of right hip 02/06/2018   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   Chronic allergic conjunctivitis 10/18/2017   Controlled type 2 diabetes mellitus with hyperglycemia, without long-term current use of insulin ([place]) 10/18/2017   Dry eyes, bilateral 10/18/2017   Hollenhorst plaque, right eye 10/18/2017   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 macular degeneration of left eye 10/18/2017   Primary open angle glaucoma (POAG) of both eyes, indeterminate stage 10/18/2017   Retinal artery branch occlusion of right eye 06/12/2017   Stroke ([place]) 05/2017   Lymphedema of both lower extremities 12/26/2014   Venous  hypertension of both lower extremities 12/26/2014   Acidosis, metabolic 71/24/5809   Adverse effect of vancomycin 11/29/2014   Hyperglycemia 11/29/2014   Infection and inflammatory reaction due to other internal joint prosthesis, initial encounter ([place]) 08/08/2014   Infection of prosthetic right knee joint ([place]) 07/23/2014   Edema 07/22/2014   Hypokalemia 07/22/2014   GERD (gastroesophageal reflux disease) 11/13/2012   Essential hypertension, benign 06/17/2012   Impaired fasting glucose 06/17/2012   Other specified acquired hypothyroidism 06/05/2012   Restless leg syndrome 06/05/2012    Past Surgical History:  Procedure Laterality Date   ABDOMINAL HYSTERECTOMY  1993   Uterine Prolapse partial   MASTECTOMY  1974   right breast not sure if cancer 1960's   REPLACEMENT TOTAL KNEE BILATERAL Bilateral    SMALL TOE RIGHT FOOT REMOVED  04/2017   DUE TO INFECTION   Spleenectomy  2000   TOTAL HIP ARTHROPLASTY Right 02/28/2018   Procedure: RIGHT TOTAL HIP ARTHROPLASTY ANTERIOR APPROACH;  Surgeon: Gaynelle Arabian, MD;  Location: [facility];  Service: Orthopedics;  Laterality: Right;  19min   TOTAL HIP ARTHROPLASTY Left 01/22/2020   Procedure: TOTAL HIP ARTHROPLASTY ANTERIOR APPROACH;  Surgeon: Gaynelle Arabian, MD;  Location: [facility];  Service: Orthopedics;  Laterality: Left;  157min   TOTAL KNEE ARTHROPLASTY WITH REVISION COMPONENTS      OB History   No obstetric history on file.      Home Medications    Prior to Admission medications   Medication Sig Start Date End Date Taking? Authorizing Provider  sulfamethoxazole-trimethoprim (BACTRIM DS) 800-160 MG tablet Take 1 tablet by mouth 2 (two) times daily for 5 days. 11/30/20 12/05/20 Yes Lynden Oxford Scales, PA-C  apixaban (ELIQUIS) 2.5 MG TABS tablet Take 1 tablet (2.5 mg total) by mouth 2 (two) times daily. 05/12/20   Richardo Priest, MD  brimonidine (ALPHAGAN) 0.2 % ophthalmic solution Place 1 drop into both eyes 2 (two) times daily.     [provider]  Cholecalciferol (VITAMIN D3) 50 MCG (2000 UT) TABS Take 2,000 Units by mouth daily.    [provider]  famotidine (PEPCID) 40 MG tablet Take 40 mg by mouth at bedtime.  08/20/19   [provider]  ferrous sulfate 325 (65 FE) MG tablet Take 325 mg by mouth 2 (two) times daily.    [provider]  furosemide (LASIX) 40 MG tablet Take 40 mg by mouth daily. 04/07/20   [provider]  latanoprost (XALATAN) 0.005 % ophthalmic solution Place 1 drop into both eyes at bedtime.    [provider]  levothyroxine (SYNTHROID, LEVOTHROID) 88 MCG tablet Take 88 mcg by mouth daily before breakfast.    [provider]  metFORMIN (GLUCOPHAGE) 500 MG tablet Take 500 mg by mouth daily.  06/11/19   [provider]  metFORMIN (GLUCOPHAGE-XR) 500 MG 24 hr tablet Take 500 mg  by mouth 2 (two) times daily. 08/26/20   [provider]  metolazone (ZAROXOLYN) 2.5 MG tablet Take 1 tablet (2.5 mg total) by mouth once a week. 05/12/20 08/10/20  Richardo Priest, MD  metoprolol succinate (TOPROL-XL) 50 MG 24 hr tablet Take one tablet by mouth daily in the morning and 1/2 tablet by mouth daily in the evening. 07/01/20   Richardo Priest, MD  omeprazole (PRILOSEC) 40 MG capsule Take 1 capsule by mouth daily. 04/30/20   [provider]  potassium chloride SA (KLOR-CON) 20 MEQ tablet Take 1 tablet (20 mEq total) by mouth daily. 05/12/20   Richardo Priest, MD  pramipexole (MIRAPEX) 1 MG tablet Take 1 mg by mouth at bedtime.    [provider]  simvastatin (ZOCOR) 20 MG tablet Take 20 mg by mouth every evening.    [provider]  timolol (BETIMOL) 0.25 % ophthalmic solution Place 1 drop into both eyes 2 (two) times daily.    [provider]  triamcinolone ointment (KENALOG) 0.1 % Apply 1 application topically 2 (two) times daily as needed (skin irritation.).    [provider]  vitamin C (ASCORBIC ACID)  500 MG tablet Take 500 mg by mouth 2 (two) times daily.    [provider]    Family History Family History  Problem Relation Age of Onset   Cancer Mother    Heart disease Father    Hyperlipidemia Father     Social History Social History   Tobacco Use   Smoking status: Former    Packs/day: 1.00    Years: 30.00    Pack years: 30.00    Types: Cigarettes   Smokeless tobacco: Never   Tobacco comments:    qiot 50 yrs ago  Vaping Use   Vaping Use: Never used  Substance Use Topics   Alcohol use: Not Currently   Drug use: No     Allergies   Arthrotec [diclofenac-misoprostol], Doxycycline, Latex, Amlodipine, Celebrex [celecoxib], Vancomycin, Diclofenac, and Shellfish allergy   Review of Systems Review of Systems Pertinent findings noted in history of present illness.    Physical Exam Triage Vital Signs ED Triage Vitals  Enc Vitals Group     BP 11/30/20 1537 106/83     Pulse Rate 11/30/20 1537 83     Resp 11/30/20 1537 20     Temp 11/30/20 1537 97.8 F (36.6 C)     Temp Source 11/30/20 1537 Oral     SpO2 11/30/20 1537 96 %     Weight --      Height --      Head Circumference --      Peak Flow --      Pain Score 11/30/20 1534 9     Pain Loc --      Pain Edu? --      Excl. in [place]? --    No data found.  Updated Vital Signs BP 106/83 (BP Location: Left Arm)   Pulse 83   Temp 97.8 F (36.6 C) (Oral)   Resp 20   SpO2 96%   Visual Acuity Right Eye Distance:   Left Eye Distance:   Bilateral Distance:    Right Eye Near:   Left Eye Near:    Bilateral Near:     Physical Exam Vitals and nursing note reviewed.  Constitutional:      Appearance: Normal appearance.  HENT:     Head: Normocephalic and atraumatic.  Eyes:     Conjunctiva/sclera:  Conjunctivae normal.  Cardiovascular:     Rate and Rhythm: Normal rate and regular rhythm.     Pulses: Normal pulses.     Heart sounds: Normal heart sounds.  Pulmonary:     Effort: Pulmonary effort is  normal.     Breath sounds: Normal breath sounds.  Abdominal:     General: Abdomen is flat. Bowel sounds are normal.     Palpations: Abdomen is soft.     Tenderness: There is no abdominal tenderness. There is no right CVA tenderness, left CVA tenderness or guarding.  Musculoskeletal:        General: Normal range of motion.     Cervical back: Normal range of motion and neck supple.     Right lower leg: Edema present.     Left lower leg: Edema present.  Skin:    General: Skin is warm and dry.  Neurological:     General: No focal deficit present.     Mental Status: She is alert and oriented to person, place, and time. Mental status is at baseline.  Psychiatric:        Mood and Affect: Mood normal.        Behavior: Behavior normal.     UC Treatments / Results  Labs (all labs ordered are listed, but only abnormal results are displayed) Labs Reviewed  POCT URINALYSIS DIP (MANUAL ENTRY) - Abnormal; Notable for the following components:      Result Value   Clarity, UA cloudy (*)    Leukocytes, UA Small (1+) (*)    All other components within normal limits  URINE CULTURE    EKG   Radiology No results found.  Procedures Procedures (including critical care time)  Medications Ordered in UC Medications - No data to display  Initial Impression / Assessment and Plan / UC Course  I have reviewed the triage vital signs and the nursing notes.  Pertinent labs & imaging results that were available during my care of the patient were reviewed by me and considered in my medical decision making (see chart for details).     Urine dip today is largely unremarkable with the exception of cloudy appearance.  Given patient's age and reported symptoms, however, I feel it is appropriate to begin treating her empirically for presumed urinary tract infection with broad-spectrum antibiotics. Final Clinical Impressions(s) / UC Diagnoses   Final diagnoses:  Dysuria     Discharge Instructions       Your urinalysis today revealed cloudy urine.  As we discussed, I recommend that we treat you empirically for presumed urinary tract infection with an antibiotic called Bactrim.  Please take 1 tablet twice daily, morning and evening, for the next 5 days.  In the meantime, we will be sending your urine to the laboratory for culture to find the specific bacterial species causing the infection.  You will be notified of this result once is received, this takes usually 3 to 5 days.  If you have worsening symptoms, is recommended that he present to the emergency room for further emergency evaluation as this could be concerning for the infection leaving the bladder and moving towards the kidneys.  Thank you for your visit to urgent care today.     ED Prescriptions     Medication Sig Dispense Auth. Provider   sulfamethoxazole-trimethoprim (BACTRIM DS) 800-160 MG tablet Take 1 tablet by mouth 2 (two) times daily for 5 days. 10 tablet Lynden Oxford Scales, PA-C      PDMP not  reviewed this encounter.   Lynden Oxford Scales, PA-C 11/30/20 [id number]

## 2020-11-30 NOTE — ED Triage Notes (Signed)
Pt reports possibly having a UTI (symptoms: lower abd pain, burning with urination, and urinary urgency).   Started: 2 months ago

## 2020-12-01 NOTE — Progress Notes (Signed)
Cardiology Office Note:    Date:  12/02/2020   ID:  [name], DOB 17-Jul-1930, MRN [id number]  PCP:  Jonathon Resides, MD  Cardiologist:  Shirlee More, MD    Referring MD: Jonathon Resides, MD    ASSESSMENT:    1. Hypertensive heart and kidney disease with chronic diastolic congestive heart failure and stage 3 chronic kidney disease, unspecified whether stage 3a or 3b CKD ([place])   2. Chronic diastolic heart failure (HCC)   3. Persistent atrial fibrillation ([place])   4. Chronic anticoagulation    PLAN:    In order of problems listed above:  Overall she is done exceptionally well but she is retained a bit of salt and water and will reduce have her take an extra tablet of furosemide early afternoon any day that she exceeds 2 pound increase from her baseline of 147 pounds and continue her current loop and proximal diuretic metolazone.  Recheck labs today renal function potassium hemoglobin with anticoagulation and proBNP level Stable rate controlled continue her beta-blocker Continue her anticoagulant   Next appointment: 4 months   Medication Adjustments/Labs and Tests Ordered: Current medicines are reviewed at length with the patient today.  Concerns regarding medicines are outlined above.  Orders Placed This Encounter  Procedures   CBC   Basic metabolic panel   Pro b natriuretic peptide (BNP)   No orders of the defined types were placed in this encounter.   Chief Complaint  Patient presents with   Follow-up   Congestive Heart Failure    History of Present Illness:    Dana Bradford is a 87 y.o. female with a hx of hypertensive heart disease with stage III CKD diastolic heart failure and persistent atrial fibrillation with anticoagulation last seen 07/01/2020 improved with compensated heart failure and her ventricular rate was elevated and her beta-blocker dose was increased.  Compliance with diet, lifestyle and medications: Yes  Her daughter is present participates  in evaluation decision making and supervises her mother She uses a pillbox compliant with medications She sodium restricts Weighs daily and her weight is drifted up 3 to 4 pounds. She notices a small amount of edema but no orthopnea shortness of breath chest pain palpitation or syncope. She is more active she does garden work She has had no falls She has had no bleeding from her anticoagulant Past Medical History:  Diagnosis Date   Acute kidney failure ([place]) 2016 to 2017 due to vacomycin   no kidney issues now   Anemia    Anxiety    Arthritis    Atrial fibrillation ([place])    Depression    Diabetes mellitus without complication (HCC)    Dyspnea    Food allergy    Fish (Hives)    Glaucoma    Hypertension    Impaired fasting glucose    Other specified acquired hypothyroidism    Pneumonia    Restless leg    Restless leg    Skin cancer    Forehead   Stroke ([place]) 05/2017   ocular stoke on plavix , right eye some vision loss   Venous stasis ulcer ([place])     Past Surgical History:  Procedure Laterality Date   ABDOMINAL HYSTERECTOMY  1993   Uterine Prolapse partial   MASTECTOMY  1974   right breast not sure if cancer 1960's   REPLACEMENT TOTAL KNEE BILATERAL Bilateral    SMALL TOE RIGHT FOOT REMOVED  04/2017   DUE TO INFECTION   Spleenectomy  2000   TOTAL HIP ARTHROPLASTY Right 02/28/2018   Procedure: RIGHT TOTAL HIP ARTHROPLASTY ANTERIOR APPROACH;  Surgeon: Gaynelle Arabian, MD;  [facility];  Service: Orthopedics;  Laterality: Right;  181min   TOTAL HIP ARTHROPLASTY Left 01/22/2020   Procedure: TOTAL HIP ARTHROPLASTY ANTERIOR APPROACH;  Surgeon: Gaynelle Arabian, MD;  [facility];  Service: Orthopedics;  Laterality: Left;  131min   TOTAL KNEE ARTHROPLASTY WITH REVISION COMPONENTS      Current Medications: Current Meds  Medication Sig   apixaban (ELIQUIS) 2.5 MG TABS tablet Take 1 tablet (2.5 mg total) by mouth 2 (two) times daily.   brimonidine (ALPHAGAN) 0.2 %  ophthalmic solution Place 1 drop into both eyes 2 (two) times daily.   Cholecalciferol (VITAMIN D3) 50 MCG (2000 UT) TABS Take 2,000 Units by mouth daily.   famotidine (PEPCID) 40 MG tablet Take 40 mg by mouth at bedtime.    ferrous sulfate 325 (65 FE) MG tablet Take 325 mg by mouth 2 (two) times daily.   furosemide (LASIX) 40 MG tablet Take 40 mg by mouth daily.   latanoprost (XALATAN) 0.005 % ophthalmic solution Place 1 drop into both eyes at bedtime.   levothyroxine (SYNTHROID, LEVOTHROID) 88 MCG tablet Take 88 mcg by mouth daily before breakfast.   metFORMIN (GLUCOPHAGE-XR) 500 MG 24 hr tablet Take 500 mg by mouth 2 (two) times daily.   metolazone (ZAROXOLYN) 2.5 MG tablet Take 1 tablet (2.5 mg total) by mouth once a week.   metoprolol succinate (TOPROL-XL) 50 MG 24 hr tablet Take one tablet by mouth daily in the morning and 1/2 tablet by mouth daily in the evening.   omeprazole (PRILOSEC) 40 MG capsule Take 1 capsule by mouth daily.   potassium chloride SA (KLOR-CON) 20 MEQ tablet Take 1 tablet (20 mEq total) by mouth daily.   pramipexole (MIRAPEX) 1 MG tablet Take 1 mg by mouth at bedtime.   simvastatin (ZOCOR) 20 MG tablet Take 20 mg by mouth every evening.   sulfamethoxazole-trimethoprim (BACTRIM DS) 800-160 MG tablet Take 1 tablet by mouth 2 (two) times daily for 5 days.   timolol (BETIMOL) 0.25 % ophthalmic solution Place 1 drop into both eyes 2 (two) times daily.   triamcinolone ointment (KENALOG) 0.1 % Apply 1 application topically 2 (two) times daily as needed (skin irritation.).   vitamin C (ASCORBIC ACID) 500 MG tablet Take 500 mg by mouth 2 (two) times daily.     Allergies:   Arthrotec [diclofenac-misoprostol], Doxycycline, Latex, Amlodipine, Celebrex [celecoxib], Vancomycin, Diclofenac, and Shellfish allergy   Social History   Socioeconomic History   Marital status: Widowed    Spouse name: Not on file   Number of children: 2   Years of education: 66   Highest education  level: Not on file  Occupational History   Occupation: Retired   Tobacco Use   Smoking status: Former    Packs/day: 1.00    Years: 30.00    Pack years: 30.00    Types: Cigarettes   Smokeless tobacco: Never   Tobacco comments:    qiot 50 yrs ago  Vaping Use   Vaping Use: Never used  Substance and Sexual Activity   Alcohol use: Not Currently   Drug use: No   Sexual activity: Not on file    Comment: Hysterectomy  Other Topics Concern   Not on file  Social History Narrative   Marital Status: Widowed    Children:  Daughter Barbaraann Share) Son Jennye Runquist)   Pets:  None    Living Situation: Lives alone    Occupation: Retired    Education: Programmer, systems    Tobacco:  She quit smoking 35 years ago after having smoked 1 ppd for 20 years.      Alcohol Use:  One glass of wine twice a week     Drug Use:  None   Diet:  Regular   Exercise:  None   Hobbies: Crafting, Gardening.             Social Determinants of Health   Financial Resource Strain: Not on file  Food Insecurity: Not on file  Transportation Needs: Not on file  Physical Activity: Not on file  Stress: Not on file  Social Connections: Not on file     Family History: The patient's family history includes Cancer in her mother; Heart disease in her father; Hyperlipidemia in her father. ROS:   Please see the history of present illness.    All other systems reviewed and are negative.  EKGs/Labs/Other Studies Reviewed:    The following studies were reviewed today:    Recent Labs: 04/22/2020: ALT 24; B Natriuretic Peptide 908.0; Hemoglobin 12.9; Platelets 376 04/23/2020: TSH 0.556 04/25/2020: Magnesium 1.7 07/01/2020: BUN 26; Creatinine, Ser 0.92; NT-Pro BNP 2,179; Potassium 3.6; Sodium 143  Recent Lipid Panel    Component Value Date/Time   CHOL 187 02/01/2012 0000    Physical Exam:    VS:  BP 134/90 (BP Location: Right Arm, Patient Position: Sitting)   Pulse (!) 104   Ht 4\' 10"  (1.473 m)   Wt 152 lb  (68.9 kg)   SpO2 97%   BMI 31.77 kg/m     Wt Readings from Last 3 Encounters:  12/02/20 152 lb (68.9 kg)  07/01/20 146 lb (66.2 kg)  05/12/20 147 lb 0.6 oz (66.7 kg)     GEN: Irregular rate and rhythm well nourished, well developed in no acute distress HEENT: Normal NECK: No JVD; No carotid bruits LYMPHATICS: No lymphadenopathy CARDIAC: Irregular rate and rhythm no murmurs, rubs, gallops RESPIRATORY:  Clear to auscultation without rales, wheezing or rhonchi  ABDOMEN: Soft, non-tender, non-distended MUSCULOSKELETAL: 1-2+ bilateral pitting no edema; No deformity  SKIN: Warm and dry NEUROLOGIC:  Alert and oriented x 3 PSYCHIATRIC:  Normal affect    Signed, Shirlee More, MD  12/02/2020 4:03 PM    [facility]

## 2020-12-02 ENCOUNTER — Other Ambulatory Visit: Payer: Self-pay

## 2020-12-02 ENCOUNTER — Encounter: Payer: Self-pay | Admitting: Cardiology

## 2020-12-02 ENCOUNTER — Ambulatory Visit (INDEPENDENT_AMBULATORY_CARE_PROVIDER_SITE_OTHER): Payer: Medicare Other | Admitting: Cardiology

## 2020-12-02 VITALS — BP 134/90 | HR 104 | Ht <= 58 in | Wt 152.0 lb

## 2020-12-02 DIAGNOSIS — I4819 Other persistent atrial fibrillation: Secondary | ICD-10-CM | POA: Diagnosis not present

## 2020-12-02 DIAGNOSIS — Z7901 Long term (current) use of anticoagulants: Secondary | ICD-10-CM

## 2020-12-02 DIAGNOSIS — N183 Chronic kidney disease, stage 3 unspecified: Secondary | ICD-10-CM

## 2020-12-02 DIAGNOSIS — I130 Hypertensive heart and chronic kidney disease with heart failure and stage 1 through stage 4 chronic kidney disease, or unspecified chronic kidney disease: Secondary | ICD-10-CM | POA: Diagnosis not present

## 2020-12-02 DIAGNOSIS — I5032 Chronic diastolic (congestive) heart failure: Secondary | ICD-10-CM

## 2020-12-02 LAB — URINE CULTURE

## 2020-12-02 NOTE — Patient Instructions (Addendum)
Medication Instructions:  Your physician has recommended you make the following change in your medication:  IF weight gets higher than 149 lb take one extra lasix (40mg ) that day  *If you need a refill on your cardiac medications before your next appointment, please call your pharmacy*   Lab Work: Your physician recommends that you return for lab work today: cbc, bmp, pro bnp  If you have labs (blood work) drawn today and your tests are completely normal, you will receive your results only by: [place] (if you have MyChart) OR A paper copy in the mail If you have any lab test that is abnormal or we need to change your treatment, we will call you to review the results.   Testing/Procedures: None   Follow-Up: At [facility], you and your health needs are our priority.  As part of our continuing mission to provide you with exceptional heart care, we have created designated Provider Care Teams.  These Care Teams include your primary Cardiologist (physician) and Advanced Practice Providers (APPs -  Physician Assistants and Nurse Practitioners) who all work together to provide you with the care you need, when you need it.  We recommend signing up for the patient portal called "MyChart".  Sign up information is provided on this After Visit Summary.  MyChart is used to connect with patients for Virtual Visits (Telemedicine).  Patients are able to view lab/test results, encounter notes, upcoming appointments, etc.  Non-urgent messages can be sent to your provider as well.   To learn more about what you can do with MyChart, go to [email or web address].    Your next appointment:   4 month(s)  The format for your next appointment:   In Person  Provider:   Shirlee More, MD   Other Instructions   Heart Failure  Weigh yourself every morning when you first wake up and record on a calender or note pad, bring this to your office visits. Using a pill tender can help with taking your  medications consistently.  Limit your fluid intake to 2 liters daily  Limit your sodium intake to less than 2-3 grams daily. Ask if you need dietary teaching.  If you gain more than 3 pounds (from your dry weight ), double your dose of diuretic for the day.  If you gain more than 5 pounds (from your dry weight), double your dose of lasix and call your heart failure doctor.  Please do not smoke tobacco since it is very bad for your heart.  Please do not drink alcohol since it can worsen your heart failure.Also avoid OTC nonsteroidal drugs, such as advil, aleve and motrin.  Try to exercise for at least 30 minutes every day because this will help your heart be more efficient. You may be eligible for supervised cardiac rehab, ask your physician.   KNOW YOUR HEART FAILURE ZONES  Green Zone (Your Goal):  No shortness of breath or trouble bleeding No weight gain of more than 3 pounds in one day or 5 pounds in a week No swelling in your ankles, feet, stomach, or hands No chest discomfort, heaviness or pain  Yellow Zone (Call the Doctor to get help!): Having 1 or more of the following: Weight gain of 3 pounds in 1 day or 5 pounds in 1 week More swelling of your feet, ankles, stomach, or hands It is harder to breath when lying down. You need to sit up Chest discomfort, heaviness, or pain You feel more tired or have less  energy than normal New or worsening dizziness Dry hacking cough You feel uneasy and you know something is not right  Red Zone (Call 911--EMERGENCY): Struggling to breathe. This does not go away when you sit up. Stronger and more regular amounts of chest discomfort New confusion or can't think clearly Fainting or near fainting   Resources form the American heart Association: RetroDivas.ch.jsp

## 2020-12-03 ENCOUNTER — Telehealth: Payer: Self-pay

## 2020-12-03 LAB — BASIC METABOLIC PANEL
BUN/Creatinine Ratio: 23 (ref 12–28)
BUN: 25 mg/dL (ref 8–27)
CO2: 27 mmol/L (ref 20–29)
Calcium: 9.6 mg/dL (ref 8.7–10.3)
Chloride: 96 mmol/L (ref 96–106)
Creatinine, Ser: 1.1 mg/dL — ABNORMAL HIGH (ref 0.57–1.00)
Glucose: 93 mg/dL (ref 70–99)
Potassium: 4.4 mmol/L (ref 3.5–5.2)
Sodium: 135 mmol/L (ref 134–144)
eGFR: 48 mL/min/{1.73_m2} — ABNORMAL LOW (ref >59)

## 2020-12-03 LAB — CBC
Hematocrit: 38.3 % (ref 34.0–46.6)
Hemoglobin: 12.8 g/dL (ref 11.1–15.9)
MCH: 32.2 pg (ref 26.6–33.0)
MCHC: 33.4 g/dL (ref 31.5–35.7)
MCV: 97 fL (ref 79–97)
Platelets: 244 10*3/uL (ref 150–450)
RBC: 3.97 x10E6/uL (ref 3.77–5.28)
RDW: 12.1 % (ref 11.7–15.4)
WBC: 7.3 10*3/uL (ref 3.4–10.8)

## 2020-12-03 LAB — PRO B NATRIURETIC PEPTIDE: NT-Pro BNP: 3828 pg/mL — ABNORMAL HIGH (ref 0–738)

## 2020-12-03 NOTE — Telephone Encounter (Signed)
-----   Message from Richardo Priest, MD sent at 12/03/2020  7:43 AM EDT ----- Good result I increased her diuretic yesterday

## 2020-12-03 NOTE — Telephone Encounter (Signed)
Left message on patients voicemail to please return our call.   

## 2020-12-03 NOTE — Telephone Encounter (Signed)
Spoke with patient regarding results and recommendation.  Patient verbalizes understanding and is agreeable to plan of care. Advised patient to call back with any issues or concerns.  

## 2020-12-09 ENCOUNTER — Ambulatory Visit
Admission: EM | Admit: 2020-12-09 | Discharge: 2020-12-09 | Disposition: A | Discharge status: Home / Self Care | Payer: Medicare Other | Attending: Emergency Medicine | Admitting: Emergency Medicine

## 2020-12-09 ENCOUNTER — Other Ambulatory Visit: Payer: Self-pay

## 2020-12-09 DIAGNOSIS — R300 Dysuria: Secondary | ICD-10-CM

## 2020-12-09 DIAGNOSIS — R82998 Other abnormal findings in urine: Secondary | ICD-10-CM

## 2020-12-09 DIAGNOSIS — N390 Urinary tract infection, site not specified: Secondary | ICD-10-CM

## 2020-12-09 DIAGNOSIS — N814 Uterovaginal prolapse, unspecified: Secondary | ICD-10-CM

## 2020-12-09 LAB — POCT URINALYSIS DIP (MANUAL ENTRY)
Bilirubin, UA: NEGATIVE
Glucose, UA: NEGATIVE mg/dL
Ketones, POC UA: NEGATIVE mg/dL
Nitrite, UA: NEGATIVE
Protein Ur, POC: NEGATIVE mg/dL
Spec Grav, UA: >=1.03 — AB (ref 1.010–1.025)
Urobilinogen, UA: 0.2 E.U./dL
pH, UA: 5 (ref 5.0–8.0)

## 2020-12-09 MED ORDER — SULFAMETHOXAZOLE-TRIMETHOPRIM 800-160 MG PO TABS: 1.0000 | ORAL_TABLET | Freq: Two times a day (BID) | ORAL | 0 refills | Status: AC

## 2020-12-09 MED ORDER — SULFAMETHOXAZOLE-TRIMETHOPRIM 400-80 MG PO TABS: 0.5000 | ORAL_TABLET | ORAL | 0 refills | Status: AC

## 2020-12-09 NOTE — ED Provider Notes (Addendum)
[facility]    CSN: [id number] Arrival date & time: 12/09/20  1402      History   Chief Complaint Chief Complaint  Patient presents with   uti    HPI Dana Bradford is a 87 y.o. female.   Patient presents with daughter again today with a chief complaint of urinary tract infection symptoms, she was last seen on November 30, 2020 for the same symptoms.  At that visit, she was treated empirically with Bactrim and urine was sent out for culture.  On December 02, 2020, per patient's chart notes, patient was advised that her urine culture showed multiple species and that she could discontinue Bactrim at that time. Patient states she did so even though she had been experiencing some relief while taking it for those 2 days.  Patient states that after stopping Bactrim, she felt that her symptoms returned.  Today, Patient reports burning with urination, increased frequency of urination and a sensation of incomplete emptying.  Patient denies fever, aches, chills, altered mental status, urine malodor, vaginal discharge, confusion at this time.    The history is provided by the patient and a relative.   Past Medical History:  Diagnosis Date   Acute kidney failure ([place]) 2016 to 2017 due to vacomycin   no kidney issues now   Anemia    Anxiety    Arthritis    Atrial fibrillation ([place])    Depression    Diabetes mellitus without complication ([place])    Dyspnea    Food allergy    Fish (Hives)    Glaucoma    Hypertension    Impaired fasting glucose    Other specified acquired hypothyroidism    Pneumonia    Restless leg    Restless leg    Skin cancer    Forehead   Stroke ([place]) 05/2017   ocular stoke on plavix , right eye some vision loss   Venous stasis ulcer ([place])     Patient Active Problem List   Diagnosis Date Noted   Depression    Anxiety    Antibiotic-induced nephrotoxicity 05/26/2020   Chronic anticoagulation 05/12/2020   Venous stasis ulcer ([place])    Skin cancer     Restless leg    Pneumonia    Hypertension    Glaucoma    Food allergy    Dyspnea    Diabetes mellitus without complication (HCC)    Atrial fibrillation (HCC)    Arthritis    Anemia    Acute kidney failure ([place])    CHF exacerbation ([place]) 04/23/2020   Acute CHF (congestive heart failure) ([place]) 04/22/2020   Cellulitis of lower extremity 04/22/2020   ARF (acute renal failure) ([place]) 04/22/2020   CHF (congestive heart failure) ([place]) 04/22/2020   History of revision of total replacement of left hip joint 01/31/2020   Dehydration 01/19/2020   Hypotension 01/19/2020   Hypothyroidism 01/19/2020   Hyperlipidemia 01/19/2020   AF (paroxysmal atrial fibrillation) ([place]) 01/19/2020   Left leg cellulitis 01/18/2020   Degeneration of lumbar intervertebral disc 12/02/2019   Degenerative spondylolisthesis 12/02/2019   Scoliosis deformity of spine 12/02/2019   Pain in left knee 11/20/2019   Back pain 08/24/2019   Generalized weakness 08/24/2019   Chronic venous insufficiency 03/01/2019   Diabetic ulcer of toe of right foot associated with type 2 diabetes mellitus, with fat layer exposed ([place]) 02/08/2019   Venous stasis dermatitis of left lower extremity 06/05/2018   History of total hip replacement, right 04/02/2018  OA (osteoarthritis) of hip 02/28/2018   Pain in joint of right hip 02/06/2018   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   Chronic allergic conjunctivitis 10/18/2017   Controlled type 2 diabetes mellitus with hyperglycemia, without long-term current use of insulin ([place]) 10/18/2017   Dry eyes, bilateral 10/18/2017   Hollenhorst plaque, right eye 10/18/2017   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 macular degeneration of left eye 10/18/2017   Primary open angle glaucoma (POAG) of both eyes, indeterminate stage 10/18/2017   Retinal artery branch occlusion of right eye 06/12/2017   Stroke ([place]) 05/2017   Lymphedema of both lower extremities 12/26/2014   Venous hypertension of  both lower extremities 12/26/2014   Acidosis, metabolic 40/97/3532   Adverse effect of vancomycin 11/29/2014   Hyperglycemia 11/29/2014   Infection and inflammatory reaction due to other internal joint prosthesis, initial encounter ([place]) 08/08/2014   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   Edema 07/22/2014   Hypokalemia 07/22/2014   GERD (gastroesophageal reflux disease) 11/13/2012   Essential hypertension, benign 06/17/2012   Impaired fasting glucose 06/17/2012   Other specified acquired hypothyroidism 06/05/2012   Restless leg syndrome 06/05/2012    Past Surgical History:  Procedure Laterality Date   ABDOMINAL HYSTERECTOMY  1993   Uterine Prolapse partial   MASTECTOMY  1974   right breast not sure if cancer 1960's   REPLACEMENT TOTAL KNEE BILATERAL Bilateral    SMALL TOE RIGHT FOOT REMOVED  04/2017   DUE TO INFECTION   Spleenectomy  2000   TOTAL HIP ARTHROPLASTY Right 02/28/2018   Procedure: RIGHT TOTAL HIP ARTHROPLASTY ANTERIOR APPROACH;  Surgeon: Gaynelle Arabian, MD;  Location: [facility];  Service: Orthopedics;  Laterality: Right;  172min   TOTAL HIP ARTHROPLASTY Left 01/22/2020   Procedure: TOTAL HIP ARTHROPLASTY ANTERIOR APPROACH;  Surgeon: Gaynelle Arabian, MD;  Location: [facility];  Service: Orthopedics;  Laterality: Left;  164min   TOTAL KNEE ARTHROPLASTY WITH REVISION COMPONENTS      OB History   No obstetric history on file.      Home Medications    Prior to Admission medications   Medication Sig Start Date End Date Taking? Authorizing Provider  apixaban (ELIQUIS) 2.5 MG TABS tablet Take 1 tablet (2.5 mg total) by mouth 2 (two) times daily. 05/12/20   Richardo Priest, MD  brimonidine (ALPHAGAN) 0.2 % ophthalmic solution Place 1 drop into both eyes 2 (two) times daily.    [provider]  Cholecalciferol (VITAMIN D3) 50 MCG (2000 UT) TABS Take 2,000 Units by mouth daily.    [provider]  famotidine (PEPCID) 40 MG tablet Take 40 mg by  mouth at bedtime.  08/20/19   [provider]  ferrous sulfate 325 (65 FE) MG tablet Take 325 mg by mouth 2 (two) times daily.    [provider]  furosemide (LASIX) 40 MG tablet Take 40 mg by mouth daily. 04/07/20   [provider]  latanoprost (XALATAN) 0.005 % ophthalmic solution Place 1 drop into both eyes at bedtime.    [provider]  levothyroxine (SYNTHROID, LEVOTHROID) 88 MCG tablet Take 88 mcg by mouth daily before breakfast.    [provider]  metFORMIN (GLUCOPHAGE-XR) 500 MG 24 hr tablet Take 500 mg by mouth 2 (two) times daily. 08/26/20   [provider]  metolazone (ZAROXOLYN) 2.5 MG tablet Take 1 tablet (2.5 mg total) by mouth once a week. 05/12/20 12/02/20  Richardo Priest, MD  metoprolol succinate (TOPROL-XL) 50 MG 24  hr tablet Take one tablet by mouth daily in the morning and 1/2 tablet by mouth daily in the evening. 07/01/20   Richardo Priest, MD  omeprazole (PRILOSEC) 40 MG capsule Take 1 capsule by mouth daily. 04/30/20   [provider]  potassium chloride SA (KLOR-CON) 20 MEQ tablet Take 1 tablet (20 mEq total) by mouth daily. 05/12/20   Richardo Priest, MD  pramipexole (MIRAPEX) 1 MG tablet Take 1 mg by mouth at bedtime.    [provider]  simvastatin (ZOCOR) 20 MG tablet Take 20 mg by mouth every evening.    [provider]  timolol (BETIMOL) 0.25 % ophthalmic solution Place 1 drop into both eyes 2 (two) times daily.    [provider]  triamcinolone ointment (KENALOG) 0.1 % Apply 1 application topically 2 (two) times daily as needed (skin irritation.).    [provider]  vitamin C (ASCORBIC ACID) 500 MG tablet Take 500 mg by mouth 2 (two) times daily.    [provider]    Family History Family History  Problem Relation Age of Onset   Cancer Mother    Heart disease Father    Hyperlipidemia Father     Social History Social History   Tobacco Use   Smoking  status: Former    Packs/day: 1.00    Years: 30.00    Pack years: 30.00    Types: Cigarettes   Smokeless tobacco: Never   Tobacco comments:    qiot 50 yrs ago  Vaping Use   Vaping Use: Never used  Substance Use Topics   Alcohol use: Not Currently   Drug use: No     Allergies   Arthrotec [diclofenac-misoprostol], Doxycycline, Latex, Amlodipine, Celebrex [celecoxib], Vancomycin, Diclofenac, and Shellfish allergy   Review of Systems Review of Systems Pertinent findings noted in history of present illness.    Physical Exam Triage Vital Signs ED Triage Vitals  Enc Vitals Group     BP 12/09/20 1414 117/85     Pulse Rate 12/09/20 1414 95     Resp 12/09/20 1414 20     Temp 12/09/20 1414 97.6 F (36.4 C)     Temp Source 12/09/20 1414 Oral     SpO2 12/09/20 1414 95 %     Weight --      Height --      Head Circumference --      Peak Flow --      Pain Score 12/09/20 1412 0     Pain Loc --      Pain Edu? --      Excl. in [place]? --    No data found.  Updated Vital Signs BP 117/85 (BP Location: Left Arm)   Pulse 95   Temp 97.6 F (36.4 C) (Oral)   Resp 20   SpO2 95%   Visual Acuity Right Eye Distance:   Left Eye Distance:   Bilateral Distance:    Right Eye Near:   Left Eye Near:    Bilateral Near:     Physical Exam Vitals and nursing note reviewed.  Constitutional:      Appearance: Normal appearance.  HENT:     Head: Normocephalic and atraumatic.  Eyes:     Conjunctiva/sclera: Conjunctivae normal.  Cardiovascular:     Rate and Rhythm: Normal rate and regular rhythm.     Pulses: Normal pulses.     Heart sounds: Normal heart sounds.  Pulmonary:     Effort: Pulmonary effort is normal.  Breath sounds: Normal breath sounds.  Abdominal:     General: Abdomen is flat. Bowel sounds are normal.     Palpations: Abdomen is soft.     Tenderness: There is no abdominal tenderness. There is no right CVA tenderness, left CVA tenderness or guarding.  Musculoskeletal:         General: Normal range of motion.     Cervical back: Normal range of motion and neck supple.     Right lower leg: Edema present.     Left lower leg: Edema present.  Skin:    General: Skin is warm and dry.  Neurological:     General: No focal deficit present.     Mental Status: She is alert and oriented to person, place, and time. Mental status is at baseline.  Psychiatric:        Mood and Affect: Mood normal.        Behavior: Behavior normal.     UC Treatments / Results  Labs (all labs ordered are listed, but only abnormal results are displayed) Labs Reviewed  POCT URINALYSIS DIP (MANUAL ENTRY) - Abnormal; Notable for the following components:      Result Value   Color, UA light yellow (*)    Spec Grav, UA >=1.030 (*)    Blood, UA trace-intact (*)    Leukocytes, UA Large (3+) (*)    All other components within normal limits  URINE CULTURE    EKG   Radiology No results found.  Procedures Procedures (including critical care time)  Medications Ordered in UC Medications - No data to display  Initial Impression / Assessment and Plan / UC Course  I have reviewed the triage vital signs and the nursing notes.  Pertinent labs & imaging results that were available during my care of the patient were reviewed by me and considered in my medical decision making (see chart for details).  Patient had elevated specific gravity along with large amount of leukocytes in her urine today.  There is also trace amount of blood.  Unfortunately, patient was not able to provide enough urine for culture despite multiple cups of water provided and extended stay in the urgent care.  Ultimately, patient agreed to repeat of broad-spectrum antibiotics with a recommendation to begin prophylactic antibiotics.  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  Patient states she has been seen by gynecology and urology both have advised surgery which she states she has declined given her age and lack of competence in the surgeon with whom she consulted.  I have provided her with a name of a female gynecologist with whom she may feel more comfortable.  Patient and her daughter verbalized understanding and agreement of plan as discussed.  All questions were addressed during visit.  Please see discharge instructions below for further details of plan.  Final Clinical Impressions(s) / UC Diagnoses   Final diagnoses:  Dysuria  Leukocytes in urine     Discharge Instructions      The urinalysis performed in the office today was positive for a significant amount of white blood cells and an elevated specific gravity both of which are suggestive of bacteria being present.  As we discussed, we will send a urine sample to the lab for culture.  In the meantime while we are waiting for the results,  please be sure you are drinking at least 60 ounces of fluid every day, 15-20 of those ounces as cranberry juice and the rest as water.  When we receive the results of your urine culture, we will prescribe the best antibiotic to treat the specific organism.     ED Prescriptions   None    PDMP not reviewed this encounter.   Lynden Oxford Scales, PA-C 12/09/20 [place], [place], [place] 12/11/20 [phone]

## 2020-12-09 NOTE — Discharge Instructions (Addendum)
The urinalysis performed in the office today was positive for a significant amount of white blood cells and an elevated specific gravity both of which are suggestive of bacteria being present.  As we discussed, we will treat you empirically for acute urinary tract infection knowing that we will be unable to know the actual organism because her urine cups are entirely too small for you to provide an adequate amount of urine for culture.  We will resume Bactrim and have you complete a full 7-day course, 1 double strength tablet twice daily.  Once you have completed this you will transition to a preventive dose of Bactrim which will be 1/2 single strength tablet.  I have provided 2 separate prescriptions, 1 for each.  In the meantime, I recommend that you reach out to wonderful gynecologist, Dr. Dorien Chihuahua at [facility]. The number for scheduling appointments is [phone] and they are located in [facility].

## 2020-12-09 NOTE — ED Triage Notes (Signed)
Pt states she is having UTI symptoms still from prior visit.

## 2020-12-09 NOTE — ED Notes (Signed)
Unable to collect urine culture at this time from patient.

## 2021-02-23 ENCOUNTER — Other Ambulatory Visit (HOSPITAL_BASED_OUTPATIENT_CLINIC_OR_DEPARTMENT_OTHER): Payer: Self-pay

## 2021-02-23 DIAGNOSIS — N1832 Chronic kidney disease, stage 3b: Secondary | ICD-10-CM

## 2021-02-23 HISTORY — DX: Chronic kidney disease, stage 3b: N18.32

## 2021-02-23 MED ORDER — LEVOTHYROXINE SODIUM 88 MCG PO TABS
ORAL_TABLET | ORAL | 3 refills | Status: DC
  Filled 2021-02-23: qty 90, 90d supply, fill #0

## 2021-02-23 MED ORDER — FAMOTIDINE 40 MG PO TABS
ORAL_TABLET | ORAL | 3 refills | Status: DC
  Filled 2021-02-23: qty 90, 90d supply, fill #0

## 2021-02-23 MED ORDER — PRAMIPEXOLE DIHYDROCHLORIDE 1 MG PO TABS
ORAL_TABLET | ORAL | 3 refills | Status: DC
  Filled 2021-02-23: qty 90, 90d supply, fill #0

## 2021-02-23 MED ORDER — METFORMIN HCL ER 500 MG PO TB24
ORAL_TABLET | ORAL | 2 refills | Status: DC
  Filled 2021-02-23: qty 180, 90d supply, fill #0

## 2021-02-23 MED ORDER — SIMVASTATIN 20 MG PO TABS
ORAL_TABLET | ORAL | 3 refills | Status: DC
  Filled 2021-02-23: qty 90, 90d supply, fill #0

## 2021-02-24 ENCOUNTER — Other Ambulatory Visit (HOSPITAL_BASED_OUTPATIENT_CLINIC_OR_DEPARTMENT_OTHER): Payer: Self-pay

## 2021-02-24 MED ORDER — SULFAMETHOXAZOLE-TRIMETHOPRIM 400-80 MG PO TABS
ORAL_TABLET | ORAL | 1 refills | Status: DC
  Filled 2021-02-24: qty 20, 90d supply, fill #0
  Filled 2021-05-27: qty 20, 90d supply, fill #1

## 2021-03-17 ENCOUNTER — Ambulatory Visit (INDEPENDENT_AMBULATORY_CARE_PROVIDER_SITE_OTHER): Payer: Medicare Other | Admitting: Cardiology

## 2021-03-17 ENCOUNTER — Other Ambulatory Visit: Payer: Self-pay

## 2021-03-17 ENCOUNTER — Encounter: Payer: Self-pay | Admitting: Cardiology

## 2021-03-17 ENCOUNTER — Other Ambulatory Visit (HOSPITAL_BASED_OUTPATIENT_CLINIC_OR_DEPARTMENT_OTHER): Payer: Self-pay

## 2021-03-17 VITALS — BP 102/70 | HR 90 | Ht <= 58 in | Wt 153.0 lb

## 2021-03-17 DIAGNOSIS — N183 Chronic kidney disease, stage 3 unspecified: Secondary | ICD-10-CM

## 2021-03-17 DIAGNOSIS — I130 Hypertensive heart and chronic kidney disease with heart failure and stage 1 through stage 4 chronic kidney disease, or unspecified chronic kidney disease: Secondary | ICD-10-CM | POA: Diagnosis not present

## 2021-03-17 DIAGNOSIS — I5032 Chronic diastolic (congestive) heart failure: Secondary | ICD-10-CM

## 2021-03-17 DIAGNOSIS — Z7901 Long term (current) use of anticoagulants: Secondary | ICD-10-CM | POA: Diagnosis not present

## 2021-03-17 DIAGNOSIS — I4819 Other persistent atrial fibrillation: Secondary | ICD-10-CM

## 2021-03-17 DIAGNOSIS — M10242 Drug-induced gout, left hand: Secondary | ICD-10-CM

## 2021-03-17 MED ORDER — SIMVASTATIN 20 MG PO TABS: ORAL_TABLET | ORAL | 3 refills | Status: DC

## 2021-03-17 MED ORDER — COLCHICINE 0.6 MG PO TABS
0.6000 mg | ORAL_TABLET | Freq: Every day | ORAL | 3 refills | Status: DC
  Filled 2021-03-17: qty 90, 90d supply, fill #0

## 2021-03-17 MED ORDER — COLCHICINE 0.6 MG PO TABS: 0.6000 mg | ORAL_TABLET | Freq: Every day | ORAL | 3 refills | Status: DC

## 2021-03-17 MED ORDER — APIXABAN 5 MG PO TABS: 5.0000 mg | ORAL_TABLET | Freq: Two times a day (BID) | ORAL | 3 refills | Status: DC

## 2021-03-17 MED ORDER — METOLAZONE 2.5 MG PO TABS: 2.5000 mg | ORAL_TABLET | ORAL | 3 refills | Status: DC

## 2021-03-17 MED ORDER — FUROSEMIDE 40 MG PO TABS: 40.0000 mg | ORAL_TABLET | Freq: Two times a day (BID) | ORAL | 3 refills | Status: DC

## 2021-03-17 MED ORDER — POTASSIUM CHLORIDE CRYS ER 20 MEQ PO TBCR: 20.0000 meq | EXTENDED_RELEASE_TABLET | Freq: Every day | ORAL | 3 refills | Status: DC

## 2021-03-17 MED ORDER — METOPROLOL SUCCINATE ER 50 MG PO TB24: ORAL_TABLET | ORAL | 3 refills | Status: DC

## 2021-03-17 NOTE — Progress Notes (Signed)
Cardiology Office Note:    Date:  03/17/2021   ID:  Blima Jaimes, DOB 1931/02/11, MRN [id number]  PCP:  Jonathon Resides, MD  Cardiologist:  Shirlee More, MD    Referring MD: Jonathon Resides, MD    ASSESSMENT:    1. Hypertensive heart and kidney disease with chronic diastolic congestive heart failure and stage 3 chronic kidney disease, unspecified whether stage 3a or 3b CKD ([place])   2. Chronic diastolic heart failure (HCC)   3. Persistent atrial fibrillation ([place])   4. Chronic anticoagulation   5. Acute drug-induced gout of left hand    PLAN:    In order of problems listed above:  Overall she is doing well despite mild weight gain she has no evidence of decompensated heart failure continue her furosemide now twice daily recent renal function stable 3 weeks ago creatinine 1.05 GFR 51 cc potassium 4.0 Continue current beta-blocker heart rate is well controlled goal resting less than 100-110 she has an apple watch and I have asked her neighbor to set the upper rate limit at 134 L Continue her anticoagulant Start colchicine daily if unresponsive a short course of prednisone   Next appointment: 3 months   Medication Adjustments/Labs and Tests Ordered: Current medicines are reviewed at length with the patient today.  Concerns regarding medicines are outlined above.  No orders of the defined types were placed in this encounter.  No orders of the defined types were placed in this encounter.   Chief Complaint  Patient presents with   Follow-up   Congestive Heart Failure   Atrial Fibrillation    History of Present Illness:    Dana Bradford is a 88 y.o. female with a hx of hypertensive heart disease with stage III CKD diastolic heart failure and persistent atrial fibrillation anticoagulated last seen 12/02/2020. She is meticulously assisted by neighbor who manages her health care Compliance with diet, lifestyle and medications: Yes  Overall doing well weight is up a couple  pounds but she is not edematous or short of breath. Compliant with her diuretic and Eliquis with no bleeding Recent labs are very reassuring normal renal function potassium and hemoglobin is normalized She can reduce her iron to 1 every other day She has gout at the base of her left thumb Past Medical History:  Diagnosis Date   Acute kidney failure ([place]) 2016 to 2017 due to vacomycin   no kidney issues now   Anemia    Anxiety    Arthritis    Atrial fibrillation ([place])    Depression    Diabetes mellitus without complication (HCC)    Dyspnea    Food allergy    Fish (Hives)    Glaucoma    Hypertension    Impaired fasting glucose    Other specified acquired hypothyroidism    Pneumonia    Restless leg    Restless leg    Skin cancer    Forehead   Stroke ([place]) 05/2017   ocular stoke on plavix , right eye some vision loss   Venous stasis ulcer ([place])     Past Surgical History:  Procedure Laterality Date   ABDOMINAL HYSTERECTOMY  1993   Uterine Prolapse partial   MASTECTOMY  1974   right breast not sure if cancer 1960's   REPLACEMENT TOTAL KNEE BILATERAL Bilateral    SMALL TOE RIGHT FOOT REMOVED  04/2017   DUE TO INFECTION   Spleenectomy  2000   TOTAL HIP ARTHROPLASTY Right 02/28/2018   Procedure:  RIGHT TOTAL HIP ARTHROPLASTY ANTERIOR APPROACH;  Surgeon: Gaynelle Arabian, MD;  Location: [facility];  Service: Orthopedics;  Laterality: Right;  124min   TOTAL HIP ARTHROPLASTY Left 01/22/2020   Procedure: TOTAL HIP ARTHROPLASTY ANTERIOR APPROACH;  Surgeon: Gaynelle Arabian, MD;  Location: [facility];  Service: Orthopedics;  Laterality: Left;  175min   TOTAL KNEE ARTHROPLASTY WITH REVISION COMPONENTS      Current Medications: Current Meds  Medication Sig   brimonidine (ALPHAGAN) 0.2 % ophthalmic solution Place 1 drop into both eyes 2 (two) times daily.   Cholecalciferol (VITAMIN D3) 50 MCG (2000 UT) TABS Take 2,000 Units by mouth daily.   famotidine (PEPCID) 40 MG tablet Take 1 tablet (40 mg  total) by mouth nightly as needed for Heartburn.   ferrous sulfate 325 (65 FE) MG tablet Take 325 mg by mouth 3 (three) times a week.   latanoprost (XALATAN) 0.005 % ophthalmic solution Place 1 drop into both eyes at bedtime.   levothyroxine (SYNTHROID) 88 MCG tablet Take 1 tablet (88 mcg total) by mouth daily.   metFORMIN (GLUCOPHAGE-XR) 500 MG 24 hr tablet Take 1-2 tablets by mouth at bedtime   metolazone (ZAROXOLYN) 2.5 MG tablet Take 1 tablet (2.5 mg total) by mouth once a week.   metoprolol succinate (TOPROL-XL) 50 MG 24 hr tablet Take one tablet by mouth daily in the morning and 1/2 tablet by mouth daily in the evening.   omeprazole (PRILOSEC) 40 MG capsule Take 1 capsule by mouth daily.   potassium chloride SA (KLOR-CON) 20 MEQ tablet Take 1 tablet (20 mEq total) by mouth daily.   pramipexole (MIRAPEX) 1 MG tablet Take 1 tablet by mouth every night   simvastatin (ZOCOR) 20 MG tablet Take 1 tablet (20 mg total) by mouth nightly.   sulfamethoxazole-trimethoprim (BACTRIM) 400-80 MG tablet Take 1/2 tablet by mouth 3 times weekly.   timolol (BETIMOL) 0.25 % ophthalmic solution Place 1 drop into both eyes 2 (two) times daily.   triamcinolone ointment (KENALOG) 0.1 % Apply 1 application topically 2 (two) times daily as needed (skin irritation.).   vitamin C (ASCORBIC ACID) 500 MG tablet Take 500 mg by mouth 2 (two) times daily.   [DISCONTINUED] apixaban (ELIQUIS) 2.5 MG TABS tablet Take 1 tablet (2.5 mg total) by mouth 2 (two) times daily.   [DISCONTINUED] furosemide (LASIX) 40 MG tablet Take 40 mg by mouth daily. Additional 40 mg afternoon if weight > 150 lbs     Allergies:   Arthrotec [diclofenac-misoprostol], Doxycycline, Latex, Amlodipine, Celebrex [celecoxib], Vancomycin, Diclofenac, and Shellfish allergy   Social History   Socioeconomic History   Marital status: Widowed    Spouse name: Not on file   Number of children: 2   Years of education: 76   Highest education level: Not on  file  Occupational History   Occupation: Retired   Tobacco Use   Smoking status: Former    Packs/day: 1.00    Years: 30.00    Pack years: 30.00    Types: Cigarettes   Smokeless tobacco: Never   Tobacco comments:    qiot 50 yrs ago  Vaping Use   Vaping Use: Never used  Substance and Sexual Activity   Alcohol use: Not Currently   Drug use: No   Sexual activity: Not on file    Comment: Hysterectomy  Other Topics Concern   Not on file  Social History Narrative   Marital Status: Widowed    Children:  Daughter Barbaraann Share) Son Sherrian Nunnelley)  Pets: None    Living Situation: Lives alone    Occupation: Retired    Education: Programmer, systems    Tobacco:  She quit smoking 35 years ago after having smoked 1 ppd for 20 years.      Alcohol Use:  One glass of wine twice a week     Drug Use:  None   Diet:  Regular   Exercise:  None   Hobbies: Crafting, Gardening.             Social Determinants of Health   Financial Resource Strain: Not on file  Food Insecurity: Not on file  Transportation Needs: Not on file  Physical Activity: Not on file  Stress: Not on file  Social Connections: Not on file     Family History: The patient's family history includes Cancer in her mother; Heart disease in her father; Hyperlipidemia in her father. ROS:   Please see the history of present illness.    All other systems reviewed and are negative.  EKGs/Labs/Other Studies Reviewed:    The following studies were reviewed today:   Recent Labs: 04/22/2020: ALT 24; B Natriuretic Peptide 908.0 04/23/2020: TSH 0.556 04/25/2020: Magnesium 1.7 12/02/2020: BUN 25; Creatinine, Ser 1.10; Hemoglobin 12.8; NT-Pro BNP 3,828; Platelets 244; Potassium 4.4; Sodium 135  Recent Lipid Panel    Component Value Date/Time   CHOL 187 02/01/2012 0000    Physical Exam:    VS:  BP 102/70 (BP Location: Left Arm)    Pulse 90    Ht 4\' 10"  (1.473 m)    Wt 153 lb (69.4 kg)    SpO2 96%    BMI 31.98 kg/m     Wt  Readings from Last 3 Encounters:  03/17/21 153 lb (69.4 kg)  12/02/20 152 lb (68.9 kg)  07/01/20 146 lb (66.2 kg)     GEN:  Well nourished, well developed in no acute distress HEENT: Normal NECK: No JVD; No carotid bruits LYMPHATICS: No lymphadenopathy CARDIAC: Irregular rate and rhythm no murmurs, rubs, gallops RESPIRATORY:  Clear to auscultation without rales, wheezing or rhonchi  ABDOMEN: Soft, non-tender, non-distended MUSCULOSKELETAL:  No edema; No deformity she has warmth swelling tenderness base of the left thumb consistent with gout SKIN: Warm and dry NEUROLOGIC:  Alert and oriented x 3 PSYCHIATRIC:  Normal affect    Signed, Shirlee More, MD  03/17/2021 2:51 PM    [facility]

## 2021-03-17 NOTE — Patient Instructions (Signed)
Medication Instructions:  Your physician has recommended you make the following change in your medication:  START: Colchicine 0.6 mg take one tablet by mouth daily.  INCREASE: Furosemide 40 mg take one tablet by mouth twice daily.  DECREASE: Iron take only on Monday, Wednesday, and Friday.  *If you need a refill on your cardiac medications before your next appointment, please call your pharmacy*   Lab Work: None If you have labs (blood work) drawn today and your tests are completely normal, you will receive your results only by: [place] (if you have MyChart) OR A paper copy in the mail If you have any lab test that is abnormal or we need to change your treatment, we will call you to review the results.   Testing/Procedures: None   Follow-Up: At [facility], you and your health needs are our priority.  As part of our continuing mission to provide you with exceptional heart care, we have created designated Provider Care Teams.  These Care Teams include your primary Cardiologist (physician) and Advanced Practice Providers (APPs -  Physician Assistants and Nurse Practitioners) who all work together to provide you with the care you need, when you need it.  We recommend signing up for the patient portal called "MyChart".  Sign up information is provided on this After Visit Summary.  MyChart is used to connect with patients for Virtual Visits (Telemedicine).  Patients are able to view lab/test results, encounter notes, upcoming appointments, etc.  Non-urgent messages can be sent to your provider as well.   To learn more about what you can do with MyChart, go to [email or web address].    Your next appointment:   3 month(s)  The format for your next appointment:   In Person  Provider:   Shirlee More, MD    Other Instructions

## 2021-04-19 ENCOUNTER — Encounter: Payer: Self-pay | Admitting: Cardiology

## 2021-04-20 MED ORDER — COLCHICINE 0.6 MG PO TABS: 0.6000 mg | ORAL_TABLET | Freq: Every day | ORAL | 3 refills | Status: DC

## 2021-05-04 ENCOUNTER — Ambulatory Visit: Payer: Medicare Other | Admitting: Cardiology

## 2021-05-13 ENCOUNTER — Other Ambulatory Visit (HOSPITAL_BASED_OUTPATIENT_CLINIC_OR_DEPARTMENT_OTHER): Payer: Self-pay

## 2021-05-28 ENCOUNTER — Other Ambulatory Visit (HOSPITAL_BASED_OUTPATIENT_CLINIC_OR_DEPARTMENT_OTHER): Payer: Self-pay

## 2021-06-21 NOTE — Progress Notes (Signed)
?Cardiology Office Note:   ? ?Date:  06/22/2021  ? ?ID:  Dana Bradford, DOB [birth year], MRN [id number] ? ?PCP:  Jonathon Resides, MD (Inactive)  ?Cardiologist:  Shirlee More, MD   ? ?Referring MD: Jonathon Resides, MD  ? ? ?ASSESSMENT:   ? ?1. Hypertensive heart and kidney disease with chronic diastolic congestive heart failure and stage 3 chronic kidney disease, unspecified whether stage 3a or 3b CKD ([place])   ?2. Persistent atrial fibrillation ([place])   ?3. Chronic anticoagulation   ?4. Chronic gout of left hand, unspecified cause   ? ?PLAN:   ? ?In order of problems listed above: ? ?She continues to do well with heart failure nicely compensated New York Heart Association class I she has no edema she will continue her current loop diuretic we will recheck function today and also takes metolazone once weekly.  She has a good friend neighbor who oversees her medications and looks in on her ?Rate is controlled continue her beta-blocker and current anticoagulant ?With the severity of her gout and the debilitation I will keep her on low-dose colchicine but recheck her renal function ?Continue her statin ? ? ?Next appointment: 4 months ? ? ?Medication Adjustments/Labs and Tests Ordered: ?Current medicines are reviewed at length with the patient today.  Concerns regarding medicines are outlined above.  ?No orders of the defined types were placed in this encounter. ? ?No orders of the defined types were placed in this encounter. ? ? ?Follow-up for heart failure atrial fibrillation, last visit she was very bothered with gout ? ? ?History of Present Illness:   ? ?Dana Bradford is a 88 y.o. female with a hx of hypertensive heart disease with stage III CKD and diastolic heart failure and persistent atrial fibrillation with chronic anticoagulation last seen 03/17/2021 with mildly decompensated heart failure and acute gout of her left hand. ? ?Compliance with diet, lifestyle and medications: Yes ? ?Is a joy to see her in the office ?Her  gout resolved and she will stay on low-dose colchicine ?Weight stable not short of breath she has an Visual merchandiser and has had no alarms for high rate or low low rate activity ?Very active does heavy housework garden work even unfortunately gets on a stepladder to change curtains but she has agreed to have a another family member with her when she does these activities ?She is New York Heart Association class I no edema shortness of breath chest pain palpitation or syncope ?She takes reduced dose anticoagulant with age and has had no bleeding complication ?Past Medical History:  ?Diagnosis Date  ? Acute kidney failure ([place]) 2016 to 2017 due to vacomycin  ? no kidney issues now  ? Anemia   ? Anxiety   ? Arthritis   ? Atrial fibrillation ([place])   ? Depression   ? Diabetes mellitus without complication ([place])   ? Dyspnea   ? Food allergy   ? Fish ([place])   ? Glaucoma   ? Hypertension   ? Impaired fasting glucose   ? Other specified acquired hypothyroidism   ? Pneumonia   ? Restless leg   ? Restless leg   ? Skin cancer   ? Forehead  ? Stroke ([place]) 05/2017  ? ocular stoke on plavix , right eye some vision loss  ? Venous stasis ulcer ([place])   ? ? ?Past Surgical History:  ?Procedure Laterality Date  ? ABDOMINAL HYSTERECTOMY  1993  ? Uterine Prolapse partial  ? MASTECTOMY  1974  ?  right breast not sure if cancer 1960's  ? REPLACEMENT TOTAL KNEE BILATERAL Bilateral   ? SMALL TOE RIGHT FOOT REMOVED  04/2017  ? DUE TO INFECTION  ? Spleenectomy  2000  ? TOTAL HIP ARTHROPLASTY Right 02/28/2018  ? Procedure: RIGHT TOTAL HIP ARTHROPLASTY ANTERIOR APPROACH;  Surgeon: Gaynelle Arabian, MD;  [facility];  Service: Orthopedics;  Laterality: Right;  114mn  ? TOTAL HIP ARTHROPLASTY Left 01/22/2020  ? Procedure: TOTAL HIP ARTHROPLASTY ANTERIOR APPROACH;  Surgeon: AGaynelle Arabian MD;  [facility];  Service: Orthopedics;  Laterality: Left;  1076m  ? TOTAL KNEE ARTHROPLASTY WITH REVISION COMPONENTS    ? ? ?Current Medications: ?Current  Meds  ?Medication Sig  ? apixaban (ELIQUIS) 5 MG TABS tablet Take 1 tablet (5 mg total) by mouth 2 (two) times daily. (Patient taking differently: Take by mouth 2 (two) times daily. Take 0.5 tablet (2.5 mg)  twice daily)  ? brimonidine (ALPHAGAN) 0.2 % ophthalmic solution Place 1 drop into both eyes 2 (two) times daily.  ? Cholecalciferol (VITAMIN D3) 50 MCG (2000 UT) TABS Take 2,000 Units by mouth daily.  ? colchicine 0.6 MG tablet Take 1 tablet (0.6 mg total) by mouth daily.  ? famotidine (PEPCID) 40 MG tablet Take 1 tablet (40 mg total) by mouth nightly as needed for Heartburn.  ? ferrous sulfate 325 (65 FE) MG tablet Take 325 mg by mouth 3 (three) times a week.  ? furosemide (LASIX) 40 MG tablet Take 1 tablet (40 mg total) by mouth 2 (two) times daily.  ? latanoprost (XALATAN) 0.005 % ophthalmic solution Place 1 drop into both eyes at bedtime.  ? levothyroxine (SYNTHROID) 88 MCG tablet Take 1 tablet (88 mcg total) by mouth daily.  ? metFORMIN (GLUCOPHAGE-XR) 500 MG 24 hr tablet Take 1-2 tablets by mouth at bedtime  ? metolazone (ZAROXOLYN) 2.5 MG tablet Take 1 tablet (2.5 mg total) by mouth once a week.  ? metoprolol succinate (TOPROL-XL) 50 MG 24 hr tablet Take one tablet by mouth daily in the morning and 1/2 tablet by mouth daily in the evening.  ? omeprazole (PRILOSEC) 40 MG capsule Take 1 capsule by mouth daily.  ? potassium chloride SA (KLOR-CON M) 20 MEQ tablet Take 1 tablet (20 mEq total) by mouth daily.  ? pramipexole (MIRAPEX) 1 MG tablet Take 1 tablet by mouth every night  ? simvastatin (ZOCOR) 20 MG tablet Take 1 tablet (20 mg total) by mouth nightly.  ? sulfamethoxazole-trimethoprim (BACTRIM) 400-80 MG tablet Take 1/2 tablet by mouth 3 times weekly.  ? timolol (BETIMOL) 0.25 % ophthalmic solution Place 1 drop into both eyes 2 (two) times daily.  ? triamcinolone ointment (KENALOG) 0.1 % Apply 1 application topically 2 (two) times daily as needed (skin irritation.).  ? vitamin C (ASCORBIC ACID) 500 MG  tablet Take 500 mg by mouth 3 (three) times a week. Monday , Wednesday and Friday  ?  ? ?Allergies:   Arthrotec [diclofenac-misoprostol], Doxycycline, Latex, Amlodipine, Celebrex [celecoxib], Vancomycin, Diclofenac, and Shellfish allergy  ? ?Social History  ? ?Socioeconomic History  ? Marital status: Widowed  ?  Spouse name: Not on file  ? Number of children: 2  ? Years of education: 1270? Highest education level: Not on file  ?Occupational History  ? Occupation: Retired   ?Tobacco Use  ? Smoking status: Former  ?  Packs/day: 1.00  ?  Years: 30.00  ?  Pack years: 30.00  ?  Types: Cigarettes  ?  Passive exposure:  Past  ? Smokeless tobacco: Never  ? Tobacco comments:  ?  qiot 50 yrs ago  ?Vaping Use  ? Vaping Use: Never used  ?Substance and Sexual Activity  ? Alcohol use: Not Currently  ? Drug use: No  ? Sexual activity: Not on file  ?  Comment: Hysterectomy  ?Other Topics Concern  ? Not on file  ?Social History Narrative  ? Marital Status: Widowed   ? Children:  Daughter Barbaraann Share) Son Stepahnie Campo)  ? Pets: None   ? Living Situation: Lives alone   ? Occupation: Retired   ? Education: [employer]   ? Tobacco:  She quit smoking 35 years ago after having smoked 1 ppd for 20 years.     ? Alcohol Use:  One glass of wine twice a week    ? Drug Use:  None  ? Diet:  Regular  ? Exercise:  None  ? Hobbies: Crafting, Gardening.   ?   ?   ?   ? ?Social Determinants of Health  ? ?Financial Resource Strain: Not on file  ?Food Insecurity: Not on file  ?Transportation Needs: Not on file  ?Physical Activity: Not on file  ?Stress: Not on file  ?Social Connections: Not on file  ?  ? ?Family History: ?The patient's family history includes Cancer in her mother; Heart disease in her father; Hyperlipidemia in her father. ?ROS:   ?Please see the history of present illness.    ?All other systems reviewed and are negative. ? ?EKGs/Labs/Other Studies Reviewed:   ? ?The following studies were reviewed today: ? ? ?Recent  Labs: ?12/02/2020: BUN 25; Creatinine, Ser 1.10; Hemoglobin 12.8; NT-Pro BNP 3,828; Platelets 244; Potassium 4.4; Sodium 135  ?Recent Lipid Panel ?   ?Component Value Date/Time  ? CHOL 187 02/01/2012 0000  ? ? ?P

## 2021-06-22 ENCOUNTER — Ambulatory Visit (INDEPENDENT_AMBULATORY_CARE_PROVIDER_SITE_OTHER): Payer: Medicare Other | Admitting: Cardiology

## 2021-06-22 ENCOUNTER — Encounter: Payer: Self-pay | Admitting: Cardiology

## 2021-06-22 VITALS — BP 124/82 | HR 110 | Ht <= 58 in | Wt 153.0 lb

## 2021-06-22 DIAGNOSIS — I5032 Chronic diastolic (congestive) heart failure: Secondary | ICD-10-CM | POA: Diagnosis not present

## 2021-06-22 DIAGNOSIS — N183 Chronic kidney disease, stage 3 unspecified: Secondary | ICD-10-CM | POA: Diagnosis not present

## 2021-06-22 DIAGNOSIS — I130 Hypertensive heart and chronic kidney disease with heart failure and stage 1 through stage 4 chronic kidney disease, or unspecified chronic kidney disease: Secondary | ICD-10-CM | POA: Diagnosis not present

## 2021-06-22 DIAGNOSIS — I4819 Other persistent atrial fibrillation: Secondary | ICD-10-CM | POA: Diagnosis not present

## 2021-06-22 DIAGNOSIS — Z7901 Long term (current) use of anticoagulants: Secondary | ICD-10-CM | POA: Diagnosis not present

## 2021-06-22 DIAGNOSIS — M1A0420 Idiopathic chronic gout, left hand, without tophus (tophi): Secondary | ICD-10-CM

## 2021-06-22 NOTE — Patient Instructions (Signed)
Medication Instructions:  ?Your physician recommends that you continue on your current medications as directed. Please refer to the Current Medication list given to you today. ? ?*If you need a refill on your cardiac medications before your next appointment, please call your pharmacy* ? ? ?Lab Work: ?Your physician recommends that you return for lab work in:  ? ?Labs today in [address]: BMP, Pro BNP ? ?If you have labs (blood work) drawn today and your tests are completely normal, you will receive your results only by: ?MyChart Message (if you have MyChart) OR ?A paper copy in the mail ?If you have any lab test that is abnormal or we need to change your treatment, we will call you to review the results. ? ? ?Testing/Procedures: ?None ? ? ?Follow-Up: ?At [facility], you and your health needs are our priority.  As part of our continuing mission to provide you with exceptional heart care, we have created designated Provider Care Teams.  These Care Teams include your primary Cardiologist (physician) and Advanced Practice Providers (APPs -  Physician Assistants and Nurse Practitioners) who all work together to provide you with the care you need, when you need it. ? ?We recommend signing up for the patient portal called "MyChart".  Sign up information is provided on this After Visit Summary.  MyChart is used to connect with patients for Virtual Visits (Telemedicine).  Patients are able to view lab/test results, encounter notes, upcoming appointments, etc.  Non-urgent messages can be sent to your provider as well.   ?To learn more about what you can do with MyChart, go to [email or web address].   ? ?Your next appointment:   ?4 month(s) ? ?The format for your next appointment:   ?In Person ? ?Provider:   ?Shirlee More, MD  ? ? ?Other Instructions ?None ? ?Important Information About Sugar ? ? ? ? ? ? ?

## 2021-06-25 DIAGNOSIS — N390 Urinary tract infection, site not specified: Secondary | ICD-10-CM | POA: Insufficient documentation

## 2021-06-25 DIAGNOSIS — Z0000 Encounter for general adult medical examination without abnormal findings: Secondary | ICD-10-CM | POA: Insufficient documentation

## 2021-06-25 HISTORY — DX: Urinary tract infection, site not specified: N39.0

## 2021-07-27 ENCOUNTER — Other Ambulatory Visit: Payer: Self-pay

## 2021-07-27 ENCOUNTER — Telehealth: Payer: Self-pay | Admitting: Cardiology

## 2021-07-27 MED ORDER — PRAVASTATIN SODIUM 20 MG PO TABS: 20.0000 mg | ORAL_TABLET | Freq: Every day | ORAL | 3 refills | Status: DC

## 2021-07-27 NOTE — Telephone Encounter (Signed)
Patient returned Richard's call.

## 2021-07-27 NOTE — Telephone Encounter (Signed)
Called patient and informed her that Dr. Bettina Gavia wanted to have her stop taking her simvistatin and to take pravastatin instead. Patient agreed with this plan and had no further questions at this time. Pravastatin medication sent to the patients pharmacy via [place].

## 2021-08-10 ENCOUNTER — Other Ambulatory Visit (HOSPITAL_BASED_OUTPATIENT_CLINIC_OR_DEPARTMENT_OTHER): Payer: Self-pay

## 2021-08-10 MED ORDER — AMOXICILLIN-POT CLAVULANATE 875-125 MG PO TABS
ORAL_TABLET | ORAL | 0 refills | Status: DC
  Filled 2021-08-10: qty 20, 10d supply, fill #0

## 2021-08-17 ENCOUNTER — Other Ambulatory Visit (HOSPITAL_BASED_OUTPATIENT_CLINIC_OR_DEPARTMENT_OTHER): Payer: Self-pay

## 2021-08-17 MED ORDER — AMOXICILLIN-POT CLAVULANATE 875-125 MG PO TABS
ORAL_TABLET | ORAL | 0 refills | Status: DC
  Filled 2021-08-17: qty 20, 10d supply, fill #0

## 2021-08-18 IMAGING — DX DG CHEST 1V PORT
1 series · 1 of 1 positions shown · non-contrast
Comparison: 12/01/2014

CLINICAL DATA: Weakness

EXAM:
PORTABLE CHEST 1 VIEW

[chest]
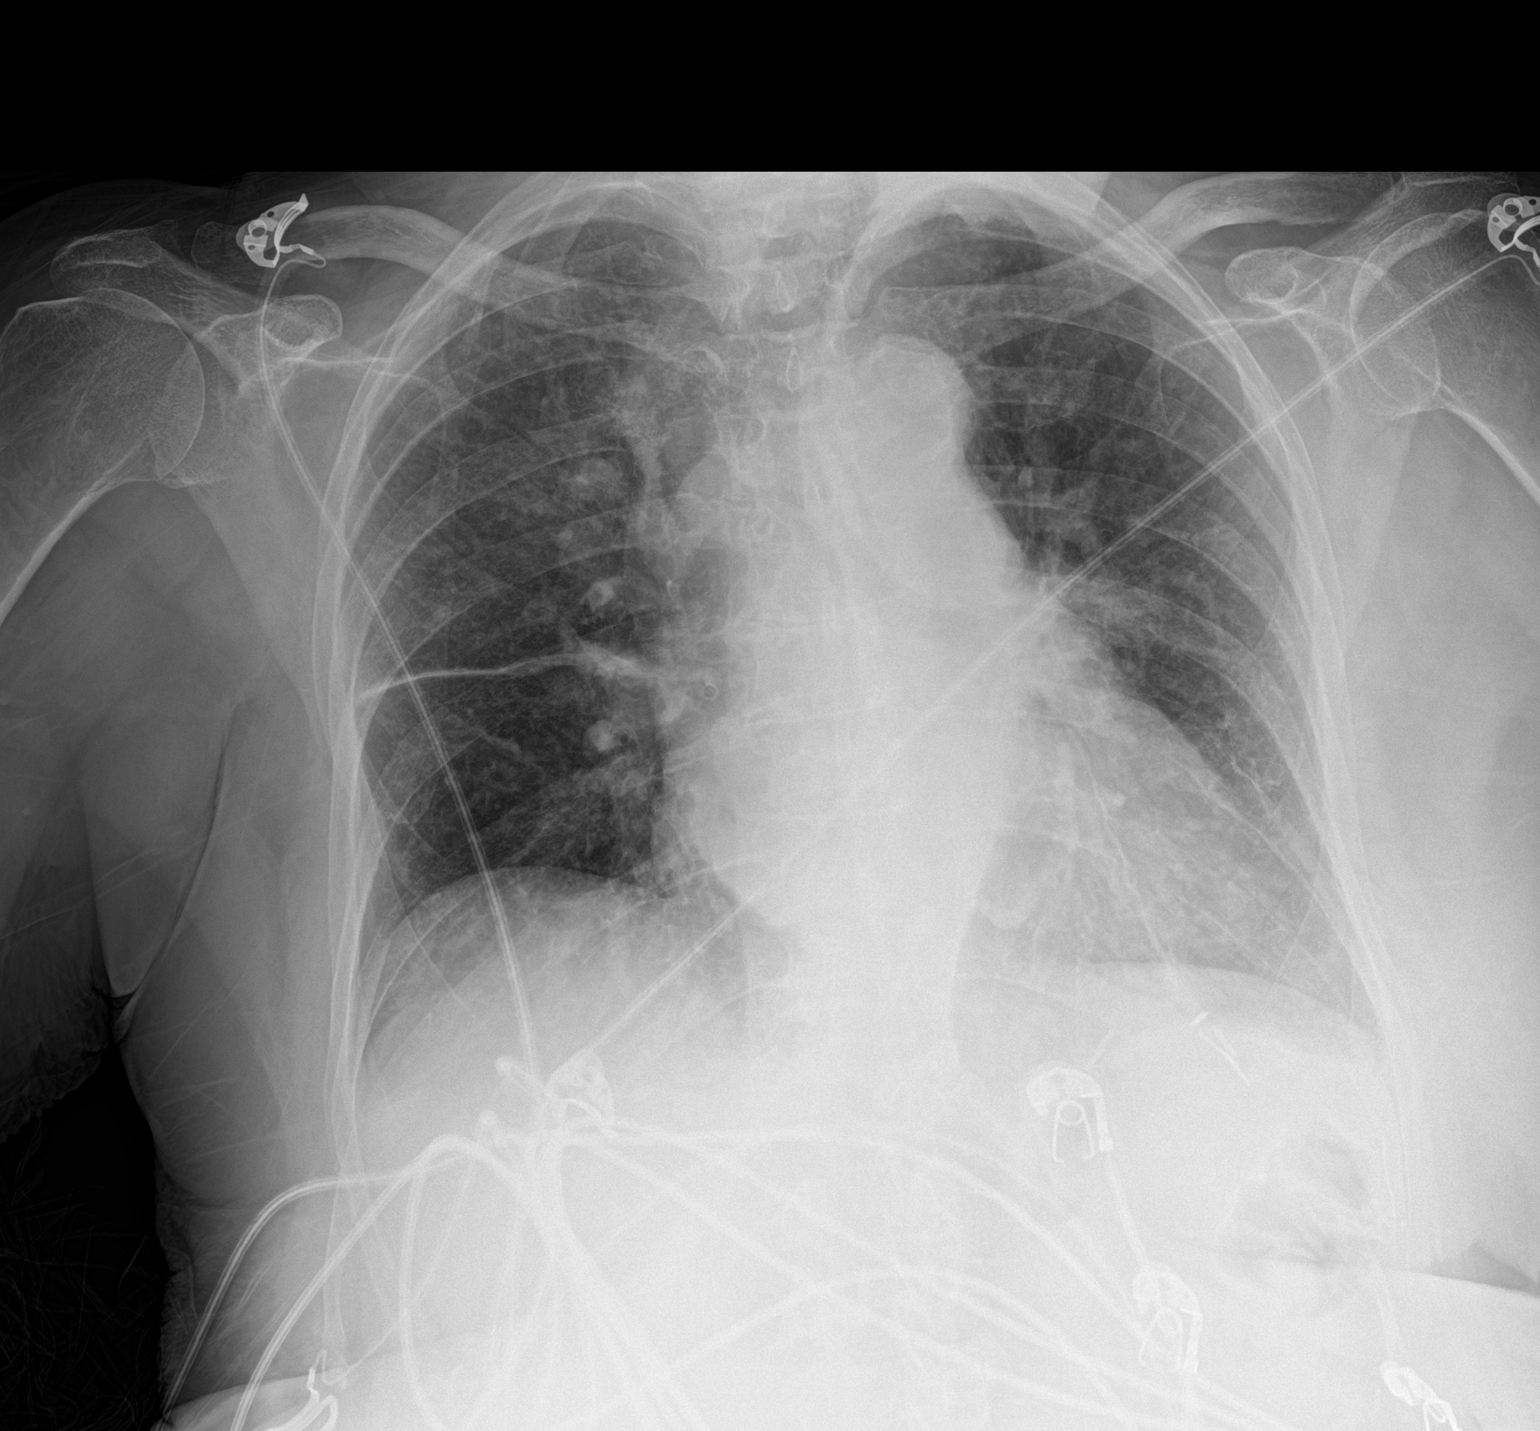

[1 of 1 positions shown; findings below may reference images not displayed]

FINDINGS: Linear scarring or atelectasis in the right mid lung. Mild
cardiomegaly with central vascular congestion. Linear atelectasis or
scar in the left base. No consolidation or effusion. Aortic
atherosclerosis. No pneumothorax.
IMPRESSION: Cardiomegaly with mild central vascular congestion.

## 2021-08-26 ENCOUNTER — Other Ambulatory Visit (HOSPITAL_BASED_OUTPATIENT_CLINIC_OR_DEPARTMENT_OTHER): Payer: Self-pay

## 2021-08-26 DIAGNOSIS — M8580 Other specified disorders of bone density and structure, unspecified site: Secondary | ICD-10-CM | POA: Insufficient documentation

## 2021-08-26 DIAGNOSIS — Z780 Asymptomatic menopausal state: Secondary | ICD-10-CM | POA: Insufficient documentation

## 2021-08-26 DIAGNOSIS — M25571 Pain in right ankle and joints of right foot: Secondary | ICD-10-CM

## 2021-08-26 HISTORY — DX: Other specified disorders of bone density and structure, unspecified site: M85.80

## 2021-08-26 HISTORY — DX: Asymptomatic menopausal state: Z78.0

## 2021-08-26 HISTORY — DX: Pain in right ankle and joints of right foot: M25.571

## 2021-08-26 MED ORDER — AMOXICILLIN-POT CLAVULANATE 875-125 MG PO TABS
ORAL_TABLET | ORAL | 0 refills | Status: DC
  Filled 2021-08-26: qty 20, 10d supply, fill #0

## 2021-09-01 LAB — HM DEXA SCAN: HM Dexa Scan: NORMAL

## 2021-09-09 ENCOUNTER — Other Ambulatory Visit (HOSPITAL_BASED_OUTPATIENT_CLINIC_OR_DEPARTMENT_OTHER): Payer: Self-pay

## 2021-09-09 MED ORDER — CEPHALEXIN 500 MG PO CAPS
ORAL_CAPSULE | ORAL | 0 refills | Status: DC
  Filled 2021-09-09: qty 20, 10d supply, fill #0

## 2021-09-09 MED ORDER — SULFAMETHOXAZOLE-TRIMETHOPRIM 400-80 MG PO TABS
ORAL_TABLET | ORAL | 1 refills | Status: DC
  Filled 2021-09-09: qty 18, 84d supply, fill #0
  Filled 2021-09-10: qty 20, 90d supply, fill #0

## 2021-09-10 ENCOUNTER — Other Ambulatory Visit (HOSPITAL_BASED_OUTPATIENT_CLINIC_OR_DEPARTMENT_OTHER): Payer: Self-pay

## 2021-09-14 ENCOUNTER — Other Ambulatory Visit (HOSPITAL_BASED_OUTPATIENT_CLINIC_OR_DEPARTMENT_OTHER): Payer: Self-pay

## 2021-09-14 MED ORDER — CEPHALEXIN 500 MG PO CAPS
ORAL_CAPSULE | ORAL | 0 refills | Status: DC
  Filled 2021-09-15: qty 30, 10d supply, fill #0

## 2021-09-15 ENCOUNTER — Other Ambulatory Visit (HOSPITAL_BASED_OUTPATIENT_CLINIC_OR_DEPARTMENT_OTHER): Payer: Self-pay

## 2021-09-16 DIAGNOSIS — M810 Age-related osteoporosis without current pathological fracture: Secondary | ICD-10-CM

## 2021-09-16 HISTORY — DX: Age-related osteoporosis without current pathological fracture: M81.0

## 2021-10-19 DIAGNOSIS — R944 Abnormal results of kidney function studies: Secondary | ICD-10-CM | POA: Insufficient documentation

## 2021-11-01 ENCOUNTER — Ambulatory Visit: Payer: Medicare Other | Attending: Cardiology | Admitting: Cardiology

## 2021-11-01 ENCOUNTER — Encounter: Payer: Self-pay | Admitting: Cardiology

## 2021-11-01 VITALS — BP 116/82 | HR 104 | Ht <= 58 in | Wt 147.1 lb

## 2021-11-01 DIAGNOSIS — I130 Hypertensive heart and chronic kidney disease with heart failure and stage 1 through stage 4 chronic kidney disease, or unspecified chronic kidney disease: Secondary | ICD-10-CM | POA: Diagnosis present

## 2021-11-01 DIAGNOSIS — Z7901 Long term (current) use of anticoagulants: Secondary | ICD-10-CM

## 2021-11-01 DIAGNOSIS — M1A2420 Drug-induced chronic gout, left hand, without tophus (tophi): Secondary | ICD-10-CM

## 2021-11-01 DIAGNOSIS — N183 Chronic kidney disease, stage 3 unspecified: Secondary | ICD-10-CM

## 2021-11-01 DIAGNOSIS — I4819 Other persistent atrial fibrillation: Secondary | ICD-10-CM

## 2021-11-01 DIAGNOSIS — I5032 Chronic diastolic (congestive) heart failure: Secondary | ICD-10-CM

## 2021-11-01 NOTE — Patient Instructions (Signed)
Medication Instructions:  Your physician recommends that you continue on your current medications as directed. Please refer to the Current Medication list given to you today.  *If you need a refill on your cardiac medications before your next appointment, please call your pharmacy*   Lab Work: Your physician recommends that you return for lab work in:   Labs today in [place] [address]: BMP, Pro BNP  If you have labs (blood work) drawn today and your tests are completely normal, you will receive your results only by: MyChart Message (if you have MyChart) OR A paper copy in the mail If you have any lab test that is abnormal or we need to change your treatment, we will call you to review the results.   Testing/Procedures: None   Follow-Up: At [facility], you and your health needs are our priority.  As part of our continuing mission to provide you with exceptional heart care, we have created designated Provider Care Teams.  These Care Teams include your primary Cardiologist (physician) and Advanced Practice Providers (APPs -  Physician Assistants and Nurse Practitioners) who all work together to provide you with the care you need, when you need it.  We recommend signing up for the patient portal called "MyChart".  Sign up information is provided on this After Visit Summary.  MyChart is used to connect with patients for Virtual Visits (Telemedicine).  Patients are able to view lab/test results, encounter notes, upcoming appointments, etc.  Non-urgent messages can be sent to your provider as well.   To learn more about what you can do with MyChart, go to [email or web address].    Your next appointment:   6 month(s)  The format for your next appointment:   In Person  Provider:   Jenne Campus, MD    Other Instructions None  Important Information About Sugar

## 2021-11-01 NOTE — Progress Notes (Signed)
Cardiology Office Note:    Date:  11/01/2021   ID:  Dana Bradford, DOB [birth year], MRN [id number]  PCP:  Jonathon Resides, MD  Cardiologist:  Shirlee More, MD    Referring MD: Jonathon Resides, MD    ASSESSMENT:    1. Hypertensive heart and kidney disease with chronic diastolic congestive heart failure and stage 3 chronic kidney disease, unspecified whether stage 3a or 3b CKD (HCC)   2. Persistent atrial fibrillation ([place])   3. Chronic anticoagulation   4. 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  Concerns regarding medicines are outlined above.  Orders Placed This Encounter  Procedures   Basic Metabolic Panel (BMET)   Pro b natriuretic peptide   No orders of the defined types were placed in this encounter.   Chief Complaint  Patient presents with   Follow-up   Congestive Heart Failure   Atrial Fibrillation    History of Present Illness:    Dana Bradford is a 88 y.o. female with a hx of hypertensive heart disease and stage III CKD and diastolic heart failure persistent atrial fibrillation gout and hyperlipidemia and chronic anticoagulation last seen 06/22/2021 compensated heart failure and improvement in her chronic gout with long-term colchicine therapy.  Compliance with diet, lifestyle and medications: Yes  She had a venous ulcer lower extremity and fortunately healed with conservative therapy She is doing well with no recurrent edema shortness of breath  chest pain palpitation or syncope She tolerates her anticoagulant without bleeding and her statin without muscle pain or weakness Past Medical History:  Diagnosis Date   Acute kidney failure ([place]) 2016 to 2017 due to vacomycin   no kidney issues now   Anemia    Anxiety    Arthritis    Atrial fibrillation ([place])    Depression    Diabetes mellitus without complication (HCC)    Dyspnea    Food allergy    Fish (Hives)    Glaucoma    Hypertension    Impaired fasting glucose    Other specified acquired hypothyroidism    Pneumonia    Restless leg    Restless leg    Skin cancer    Forehead   Stroke ([place]) 05/2017   ocular stoke on plavix , right eye some vision loss   Venous stasis ulcer ([place])     Past Surgical History:  Procedure Laterality Date   ABDOMINAL HYSTERECTOMY  1993   Uterine Prolapse partial   MASTECTOMY  1974   right breast not sure if cancer 1960's   REPLACEMENT TOTAL KNEE BILATERAL Bilateral    SMALL TOE RIGHT FOOT REMOVED  04/2017   DUE TO INFECTION   Spleenectomy  2000   TOTAL HIP ARTHROPLASTY Right 02/28/2018   Procedure: RIGHT TOTAL HIP ARTHROPLASTY ANTERIOR APPROACH;  Surgeon: Gaynelle Arabian, MD;  Location: [facility];  Service: Orthopedics;  Laterality: Right;  130mn   TOTAL HIP ARTHROPLASTY Left 01/22/2020   Procedure: TOTAL HIP ARTHROPLASTY ANTERIOR APPROACH;  Surgeon: AGaynelle Arabian MD;  Location:  [facility];  Service: Orthopedics;  Laterality: Left;  126mn   TOTAL KNEE ARTHROPLASTY WITH REVISION COMPONENTS      Current Medications: Current Meds  Medication Sig   apixaban (ELIQUIS) 5 MG TABS tablet Take 1 tablet (5 mg total) by mouth 2 (two) times daily. (Patient taking differently: Take by mouth 2 (two) times daily. Take 0.5 tablet (2.5 mg)  twice daily)   Cholecalciferol (VITAMIN D3) 50 MCG (2000 UT) TABS Take 2,000 Units by mouth daily.   colchicine 0.6 MG tablet Take 1 tablet (0.6 mg total) by mouth daily.   famotidine (PEPCID) 40 MG tablet Take 1 tablet  (40 mg total) by mouth nightly as needed for Heartburn.   ferrous sulfate 325 (65 FE) MG tablet Take 325 mg by mouth 3 (three) times a week.   furosemide (LASIX) 40 MG tablet Take 1 tablet (40 mg total) by mouth 2 (two) times daily. (Patient taking differently: Take 40 mg by mouth daily.)   levothyroxine (SYNTHROID) 88 MCG tablet Take 1 tablet (88 mcg total) by mouth daily.   metFORMIN (GLUCOPHAGE-XR) 500 MG 24 hr tablet Take 1-2 tablets by mouth at bedtime   metolazone (ZAROXOLYN) 2.5 MG tablet Take 1 tablet (2.5 mg total) by mouth once a week.   metoprolol succinate (TOPROL-XL) 50 MG 24 hr tablet Take one tablet by mouth daily in the morning and 1/2 tablet by mouth daily in the evening.   omeprazole (PRILOSEC) 40 MG capsule Take 1 capsule by mouth daily.   potassium chloride SA (KLOR-CON M) 20 MEQ tablet Take 1 tablet (20 mEq total) by mouth daily.   pramipexole (MIRAPEX) 1 MG tablet Take 1 tablet by mouth every night (Patient taking differently: Take 1 mg by mouth at bedtime as needed (Restless legs).)   pravastatin (PRAVACHOL) 20 MG tablet Take 1 tablet (20 mg total) by mouth daily. (Patient taking differently: Take 20 mg by mouth every evening.)   sulfamethoxazole-trimethoprim (BACTRIM) 400-80 MG tablet Take 1/2 tablet by mouth, 3 times weekly.   triamcinolone ointment (KENALOG) 0.1 % Apply 1 application topically 2 (two) times daily as needed (skin irritation.).   vitamin C (ASCORBIC ACID) 500 MG tablet Take 500 mg by mouth 3 (three) times a week. Monday , Wednesday and Friday     Allergies:   Arthrotec [diclofenac-misoprostol], Doxycycline, Latex, Amlodipine, Celebrex [celecoxib], Vancomycin, Diclofenac, and Shellfish allergy   Social History   Socioeconomic History   Marital status: Widowed    Spouse name: Not on file   Number of children: 2   Years of education: 141  Highest education level: Not on file  Occupational History   Occupation: Retired   Tobacco Use   Smoking status:  Former    Packs/day: 1.00    Years: 30.00    Total pack years: 30.00    Types: Cigarettes    Passive exposure: Past   Smokeless tobacco: Never   Tobacco comments:    qiot 50 yrs ago  Vaping Use   Vaping Use: Never used  Substance and Sexual Activity   Alcohol use: Not Currently   Drug use: No   Sexual activity: Not on file    Comment: Hysterectomy  Other Topics Concern   Not on file  Social History Narrative   Marital Status: Widowed    Children:  Daughter (Barbaraann Share Son (Briarrose Shor   Pets: None    Living Situation: Lives alone    Occupation: Retired    Education: HProgrammer, systems  Tobacco:  She quit smoking 35 years ago after having smoked 1 ppd for 20 years.      Alcohol Use:  One glass of wine twice a week     Drug Use:  None   Diet:  Regular   Exercise:  None   Hobbies: Crafting, Gardening.             Social Determinants of Health   Financial Resource Strain: Not on file  Food Insecurity: Not on file  Transportation Needs: Not on file  Physical Activity: Not on file  Stress: Not on file  Social Connections: Not on file     Family History: The patient's family history includes Cancer in her mother; Heart disease in her father; Hyperlipidemia in her father. ROS:   Please see the history of present illness.    All other systems reviewed and are negative.  EKGs/Labs/Other Studies Reviewed:    The following studies were reviewed today:  Recent Labs:  Labs done 1 month ago [facility] showed a hemoglobin of 13.2 platelets normal 223,000 CMP with potassium 3.5 sodium 138 creatinine 1.09 GFR 49 cc/min 06/24/2021 TSH normal 1.12 02/19/2021 cholesterol 153 LDL 83 triglycerides 87 non-HDL cholesterol 95 triglycerides 87  Physical Exam:    VS:  BP 116/82 (BP Location: Left Arm, Patient Position: Sitting)   Pulse (!) 104   Ht '4\' 10"'$  (1.473 m)   Wt 147 lb 1.9 oz (66.7 kg)   SpO2 95%   BMI 30.75 kg/m     Wt Readings from Last 3  Encounters:  11/01/21 147 lb 1.9 oz (66.7 kg)  06/22/21 153 lb (69.4 kg)  03/17/21 153 lb (69.4 kg)     GEN: She appears her age well nourished, well developed in no acute distress HEENT: Normal NECK: No JVD; No carotid bruits LYMPHATICS: No lymphadenopathy CARDIAC: Irregular rate and rhythm  no murmurs, rubs, gallops RESPIRATORY:  Clear to auscultation without rales, wheezing or rhonchi  ABDOMEN: Soft, non-tender, non-distended MUSCULOSKELETAL:  No edema; No deformity  SKIN: Warm and dry NEUROLOGIC:  Alert and oriented x 3 PSYCHIATRIC:  Normal affect    Signed, Shirlee More, MD  11/01/2021 2:50 PM    [facility]

## 2021-11-02 LAB — BASIC METABOLIC PANEL
BUN/Creatinine Ratio: 23 (ref 12–28)
BUN: 27 mg/dL (ref 8–27)
CO2: 27 mmol/L (ref 20–29)
Calcium: 9.3 mg/dL (ref 8.7–10.3)
Chloride: 96 mmol/L (ref 96–106)
Creatinine, Ser: 1.19 mg/dL — ABNORMAL HIGH (ref 0.57–1.00)
Glucose: 101 mg/dL — ABNORMAL HIGH (ref 70–99)
Potassium: 3.9 mmol/L (ref 3.5–5.2)
Sodium: 138 mmol/L (ref 134–144)
eGFR: 44 mL/min/{1.73_m2} — ABNORMAL LOW (ref >59)

## 2021-11-02 LAB — PRO B NATRIURETIC PEPTIDE: NT-Pro BNP: 2943 pg/mL — ABNORMAL HIGH (ref 0–738)

## 2021-12-27 ENCOUNTER — Other Ambulatory Visit (HOSPITAL_BASED_OUTPATIENT_CLINIC_OR_DEPARTMENT_OTHER): Payer: Self-pay

## 2021-12-27 MED ORDER — MUPIROCIN 2 % EX OINT
1.0000 | TOPICAL_OINTMENT | Freq: Two times a day (BID) | CUTANEOUS | 3 refills | Status: DC | PRN
  Filled 2021-12-27: qty 22, 15d supply, fill #0

## 2021-12-27 MED ORDER — CEPHALEXIN 500 MG PO CAPS
500.0000 mg | ORAL_CAPSULE | Freq: Three times a day (TID) | ORAL | 0 refills | Status: DC
  Filled 2021-12-27: qty 21, 7d supply, fill #0

## 2022-01-03 ENCOUNTER — Other Ambulatory Visit (HOSPITAL_BASED_OUTPATIENT_CLINIC_OR_DEPARTMENT_OTHER): Payer: Self-pay

## 2022-01-03 MED ORDER — CIPROFLOXACIN HCL 500 MG PO TABS
500.0000 mg | ORAL_TABLET | Freq: Two times a day (BID) | ORAL | 0 refills | Status: DC
  Filled 2022-01-03: qty 14, 7d supply, fill #0

## 2022-01-17 IMAGING — RF DG HIP (WITH PELVIS) OPERATIVE*L*
1 series · 2 of 2 positions shown · non-contrast
Comparison: Pelvic radiograph dated 01/18/2020.

CLINICAL DATA: 89-year-old female status post left hip
arthroplasty.

EXAM:
OPERATIVE left HIP (WITH PELVIS IF PERFORMED) one view
TECHNIQUE: Fluoroscopic spot image(s) were submitted for interpretation
post-operatively.

[Series 1: unknown protocol · 0.20mm/px · 2 of 2 slices shown]
[im 1/2]
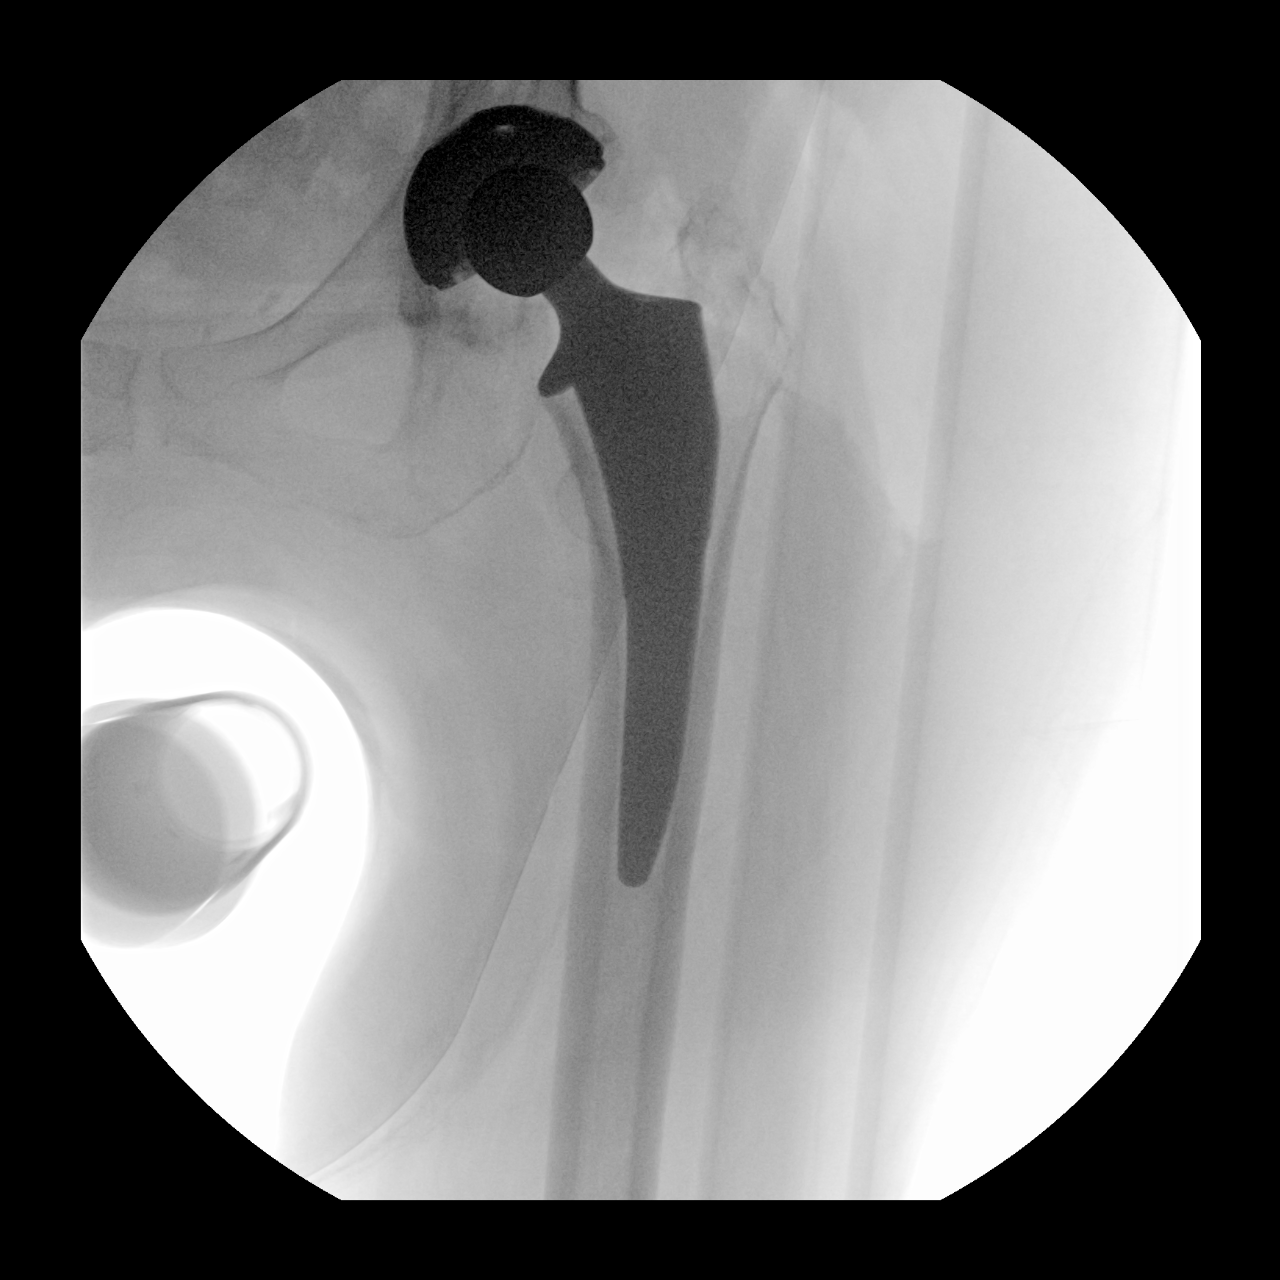
[im 2/2]
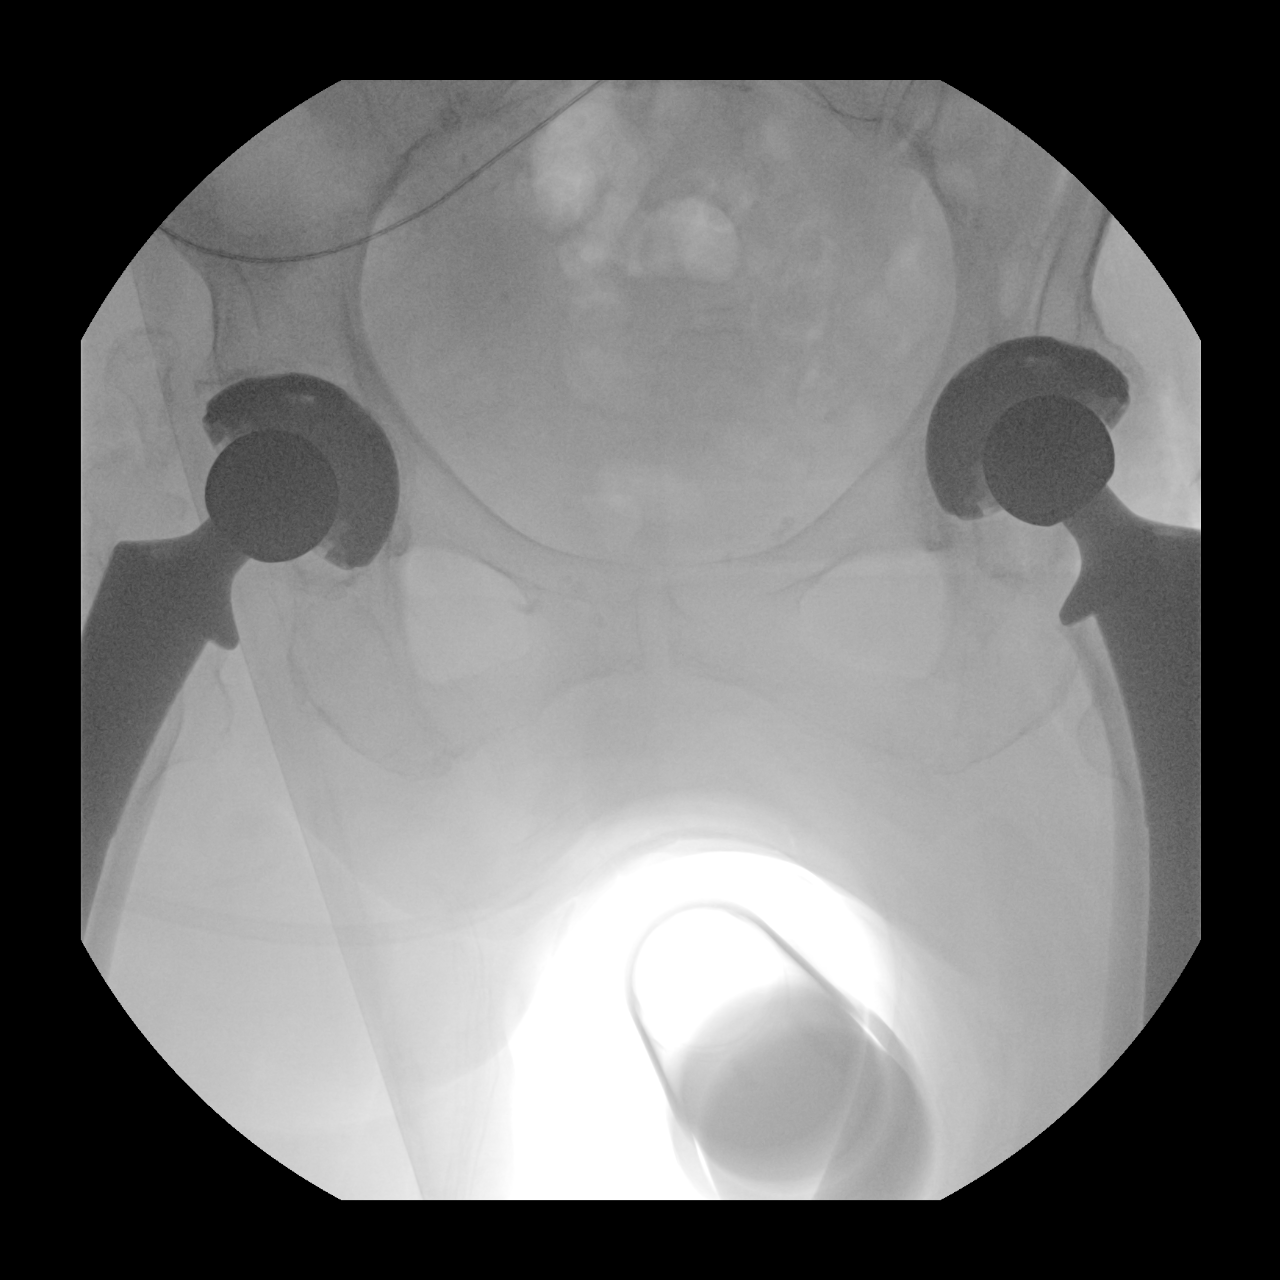

[2 of 2 positions shown; findings below may reference images not displayed]

FINDINGS: Two intraoperative fluoroscopic images provided. The total
fluoroscopic time is 10 second and cumulative air Karma of 1.4 mGy.
There has been left hip arthroplasty. A right hip arthroplasty is
also noted.
IMPRESSION: Status post left hip arthroplasty.

## 2022-01-25 ENCOUNTER — Other Ambulatory Visit (HOSPITAL_BASED_OUTPATIENT_CLINIC_OR_DEPARTMENT_OTHER): Payer: Self-pay

## 2022-01-25 MED ORDER — CIPROFLOXACIN HCL 250 MG PO TABS
250.0000 mg | ORAL_TABLET | Freq: Two times a day (BID) | ORAL | 0 refills | Status: DC
  Filled 2022-01-25: qty 20, 10d supply, fill #0

## 2022-01-26 ENCOUNTER — Ambulatory Visit: Payer: Medicare Other | Admitting: Cardiology

## 2022-01-27 ENCOUNTER — Other Ambulatory Visit (HOSPITAL_BASED_OUTPATIENT_CLINIC_OR_DEPARTMENT_OTHER): Payer: Self-pay

## 2022-01-27 MED ORDER — SANTYL 250 UNIT/GM EX OINT: TOPICAL_OINTMENT | CUTANEOUS | 1 refills | Status: DC

## 2022-01-27 MED ORDER — COLLAGENASE 250 UNIT/GM EX OINT
TOPICAL_OINTMENT | CUTANEOUS | 1 refills | Status: DC
  Filled 2022-01-27: qty 30, 30d supply, fill #0

## 2022-01-31 ENCOUNTER — Other Ambulatory Visit: Payer: Self-pay | Admitting: Cardiology

## 2022-01-31 NOTE — Telephone Encounter (Signed)
Rx refill sent to pharmacy. 

## 2022-02-22 ENCOUNTER — Ambulatory Visit: Payer: Medicare Other | Attending: Cardiology | Admitting: Cardiology

## 2022-02-22 ENCOUNTER — Encounter: Payer: Self-pay | Admitting: Cardiology

## 2022-02-22 ENCOUNTER — Ambulatory Visit: Payer: Medicare Other | Admitting: Cardiology

## 2022-02-22 VITALS — BP 112/62 | HR 86 | Ht <= 58 in | Wt 146.0 lb

## 2022-02-22 DIAGNOSIS — I872 Venous insufficiency (chronic) (peripheral): Secondary | ICD-10-CM | POA: Insufficient documentation

## 2022-02-22 DIAGNOSIS — I4821 Permanent atrial fibrillation: Secondary | ICD-10-CM | POA: Diagnosis present

## 2022-02-22 DIAGNOSIS — I10 Essential (primary) hypertension: Secondary | ICD-10-CM | POA: Diagnosis present

## 2022-02-22 DIAGNOSIS — E1165 Type 2 diabetes mellitus with hyperglycemia: Secondary | ICD-10-CM | POA: Diagnosis present

## 2022-02-22 DIAGNOSIS — R0609 Other forms of dyspnea: Secondary | ICD-10-CM | POA: Insufficient documentation

## 2022-02-22 NOTE — Addendum Note (Signed)
Addended by: Jacobo Forest D on: 02/22/2022 02:39 PM   Modules accepted: Orders

## 2022-02-22 NOTE — Patient Instructions (Addendum)
Medication Instructions:  Your physician recommends that you continue on your current medications as directed. Please refer to the Current Medication list given to you today.  *If you need a refill on your cardiac medications before your next appointment, please call your pharmacy*   Lab Work: 2nd [place]. CMP, Direct LDL- today   Testing/Procedures: Your physician has requested that you have an echocardiogram. Echocardiography is a painless test that uses sound waves to create images of your heart. It provides your doctor with information about the size and shape of your heart and how well your heart's chambers and valves are working. This procedure takes approximately one hour. There are no restrictions for this procedure. Please do NOT wear cologne, perfume, aftershave, or lotions (deodorant is allowed). Please arrive 15 minutes prior to your appointment time.    Follow-Up: At [facility], you and your health needs are our priority.  As part of our continuing mission to provide you with exceptional heart care, we have created designated Provider Care Teams.  These Care Teams include your primary Cardiologist (physician) and Advanced Practice Providers (APPs -  Physician Assistants and Nurse Practitioners) who all work together to provide you with the care you need, when you need it.  We recommend signing up for the patient portal called "MyChart".  Sign up information is provided on this After Visit Summary.  MyChart is used to connect with patients for Virtual Visits (Telemedicine).  Patients are able to view lab/test results, encounter notes, upcoming appointments, etc.  Non-urgent messages can be sent to your provider as well.   To learn more about what you can do with MyChart, go to [email or web address].    Your next appointment:   6 month(s)  The format for your next appointment:   In Person  Provider:   Jenne Campus, MD    Other Instructions NA

## 2022-02-22 NOTE — Progress Notes (Signed)
Cardiology Office Note:    Date:  02/22/2022   ID:  Dana Bradford, DOB Feb 28, 1930, MRN [id number]  PCP:  Jonathon Resides, MD (Inactive)  Cardiologist:  Jenne Campus, MD    Referring MD: Jonathon Resides, MD   Chief Complaint  Patient presents with   Leg Swelling   Shortness of Breath    History of Present Illness:    Dana Bradford is a 89 y.o. female with past medical history significant for essential hypertension, diastolic congestive heart failure, chronic atrial fibrillation, anticoagulated, she would like to establish as a patient my practice.  Her prior cardiologist Dr. Bettina Gavia retired.  She is doing fair.  Described to have some swelling of lower extremities specially evening time.  She does use some elastic wraps on her pretibial area which seems to be helping.  Denies have any chest pain tightness squeezing pressure burning chest, she is trying to be active.  She said she will only go but she walks to my office and the walker.  Past Medical History:  Diagnosis Date   Acute kidney failure ([place]) 2016 to 2017 due to vacomycin   no kidney issues now   Anemia    Anxiety    Arthritis    Atrial fibrillation ([place])    Depression    Diabetes mellitus without complication ([place])    Dyspnea    Food allergy    Fish (Hives)    Glaucoma    Hypertension    Impaired fasting glucose    Other specified acquired hypothyroidism    Pneumonia    Restless leg    Restless leg    Skin cancer    Forehead   Stroke ([place]) 05/2017   ocular stoke on plavix , right eye some vision loss   Venous stasis ulcer ([place])     Past Surgical History:  Procedure Laterality Date   ABDOMINAL HYSTERECTOMY  1993   Uterine Prolapse partial   MASTECTOMY  1974   right breast not sure if cancer 1960's   REPLACEMENT TOTAL KNEE BILATERAL Bilateral    SMALL TOE RIGHT FOOT REMOVED  04/2017   DUE TO INFECTION   Spleenectomy  2000   TOTAL HIP ARTHROPLASTY Right 02/28/2018   Procedure: RIGHT TOTAL HIP  ARTHROPLASTY ANTERIOR APPROACH;  Surgeon: Gaynelle Arabian, MD;  Location: [facility];  Service: Orthopedics;  Laterality: Right;  175mn   TOTAL HIP ARTHROPLASTY Left 01/22/2020   Procedure: TOTAL HIP ARTHROPLASTY ANTERIOR APPROACH;  Surgeon: AGaynelle Arabian MD;  Location: [facility];  Service: Orthopedics;  Laterality: Left;  1055m   TOTAL KNEE ARTHROPLASTY WITH REVISION COMPONENTS      Current Medications: Current Meds  Medication Sig   apixaban (ELIQUIS) 5 MG TABS tablet Take 1 tablet (5 mg total) by mouth 2 (two) times daily. (Patient taking differently: Take 5 mg by mouth 2 (two) times daily. Take 0.5 tablet (2.5 mg)  twice daily)   Cholecalciferol (VITAMIN D3) 50 MCG (2000 UT) TABS Take 2,000 Units by mouth daily.   colchicine 0.6 MG tablet Take 1 tablet (0.6 mg total) by mouth daily.   famotidine (PEPCID) 40 MG tablet Take 1 tablet (40 mg total) by mouth nightly as needed for Heartburn. (Patient taking differently: Take 40 mg by mouth at bedtime as needed for heartburn.)   furosemide (LASIX) 40 MG tablet Take 1 tablet (40 mg total) by mouth 2 (two) times daily. (Patient taking differently: Take 40 mg by mouth in the morning.)   levothyroxine (SYNTHROID) 88 MCG  tablet Take 1 tablet (88 mcg total) by mouth daily. (Patient taking differently: Take 88 mcg by mouth daily before breakfast.)   metFORMIN (GLUCOPHAGE-XR) 500 MG 24 hr tablet Take 1-2 tablets by mouth at bedtime (Patient taking differently: Take 500 mg by mouth at bedtime.)   metolazone (ZAROXOLYN) 2.5 MG tablet Take 1 tablet (2.5 mg total) by mouth once a week. (Patient taking differently: Take 2.5 mg by mouth once a week. monday's)   metoprolol succinate (TOPROL-XL) 50 MG 24 hr tablet Take 1 tablet (50 mg total) by mouth as directed. TAKE 1 TABLET IN THE MORNING AND ONE-HALF (1/2) TABLET IN THE EVENING, DAILY   omeprazole (PRILOSEC) 40 MG capsule Take 1 capsule by mouth in the morning.   potassium chloride SA (KLOR-CON M20) 20 MEQ tablet  TAKE 1 TABLET DAILY   pramipexole (MIRAPEX) 1 MG tablet Take 1 tablet by mouth every night (Patient taking differently: Take 1 mg by mouth at bedtime as needed (Restless legs).)   pravastatin (PRAVACHOL) 20 MG tablet Take 1 tablet (20 mg total) by mouth daily. (Patient taking differently: Take 20 mg by mouth every evening.)   sulfamethoxazole-trimethoprim (BACTRIM) 400-80 MG tablet Take 1/2 tablet by mouth, 3 times weekly. (Patient taking differently: Take 0.5 tablets by mouth 3 (three) times a week.)   triamcinolone ointment (KENALOG) 0.1 % Apply 1 application topically 2 (two) times daily as needed (skin irritation.).   vitamin C (ASCORBIC ACID) 500 MG tablet Take 500 mg by mouth 3 (three) times a week. Monday , Wednesday and Friday   [DISCONTINUED] cephALEXin (KEFLEX) 500 MG capsule Take 1 capsule (500 mg total) by mouth 3 (three) times daily with meals for 1 week   [DISCONTINUED] ciprofloxacin (CIPRO) 250 MG tablet Take 1 tablet (250 mg total) by mouth 2 (two) times daily for 10 days.   [DISCONTINUED] ciprofloxacin (CIPRO) 500 MG tablet Take 1 tablet (500 mg total) by mouth 2 (two) times daily with food for 1 week.   [DISCONTINUED] collagenase (SANTYL) 250 UNIT/GM ointment Apply topically to left leg wounds daily. (Patient taking differently: Apply 1 Application topically daily.)   [DISCONTINUED] collagenase (SANTYL) 250 UNIT/GM ointment Apply topically to left leg wounds daily. (Patient taking differently: Apply 1 Application topically daily.)   [DISCONTINUED] ferrous sulfate 325 (65 FE) MG tablet Take 325 mg by mouth 3 (three) times a week.   [DISCONTINUED] mupirocin ointment (BACTROBAN) 2 % Apply 1 Application to affected area(s) topically 2 (two) times daily as needed. (Patient taking differently: Apply 1 Application topically 2 (two) times daily as needed (wounds).)     Allergies:   Arthrotec [diclofenac-misoprostol], Doxycycline, Latex, Amlodipine, Celebrex [celecoxib], Vancomycin,  Diclofenac, and Shellfish allergy   Social History   Socioeconomic History   Marital status: Widowed    Spouse name: Not on file   Number of children: 2   Years of education: 34   Highest education level: Not on file  Occupational History   Occupation: Retired   Tobacco Use   Smoking status: Former    Packs/day: 1.00    Years: 30.00    Total pack years: 30.00    Types: Cigarettes    Passive exposure: Past   Smokeless tobacco: Never   Tobacco comments:    qiot 50 yrs ago  Vaping Use   Vaping Use: Never used  Substance and Sexual Activity   Alcohol use: Not Currently   Drug use: No   Sexual activity: Not on file    Comment: Hysterectomy  Other  Topics Concern   Not on file  Social History Narrative   Marital Status: Widowed    Children:  Daughter Barbaraann Share) Son Ruta Capece)   Pets: None    Living Situation: Lives alone    Occupation: Retired    Education: Programmer, systems    Tobacco:  She quit smoking 35 years ago after having smoked 1 ppd for 20 years.      Alcohol Use:  One glass of wine twice a week     Drug Use:  None   Diet:  Regular   Exercise:  None   Hobbies: Crafting, Gardening.             Social Determinants of Health   Financial Resource Strain: Not on file  Food Insecurity: Not on file  Transportation Needs: Not on file  Physical Activity: Not on file  Stress: Not on file  Social Connections: Not on file     Family History: The patient's family history includes Cancer in her mother; Heart disease in her father; Hyperlipidemia in her father. ROS:   Please see the history of present illness.    All 14 point review of systems negative except as described per history of present illness  EKGs/Labs/Other Studies Reviewed:      Recent Labs: 11/01/2021: BUN 27; Creatinine, Ser 1.19; NT-Pro BNP 2,943; Potassium 3.9; Sodium 138  Recent Lipid Panel    Component Value Date/Time   CHOL 187 02/01/2012 0000    Physical Exam:    VS:  BP  112/62 (BP Location: Right Arm, Patient Position: Sitting)   Pulse 86   Ht '4\' 10"'$  (1.473 m)   Wt 146 lb (66.2 kg)   SpO2 98%   BMI 30.51 kg/m     Wt Readings from Last 3 Encounters:  02/22/22 146 lb (66.2 kg)  11/01/21 147 lb 1.9 oz (66.7 kg)  06/22/21 153 lb (69.4 kg)     GEN:  Well nourished, well developed in no acute distress HEENT: Normal NECK: No JVD; No carotid bruits LYMPHATICS: No lymphadenopathy CARDIAC: Irregularly irregular, no murmurs, no rubs, no gallops RESPIRATORY:  Clear to auscultation without rales, wheezing or rhonchi  ABDOMEN: Soft, non-tender, non-distended MUSCULOSKELETAL:  No edema; No deformity  SKIN: Warm and dry LOWER EXTREMITIES: no swelling NEUROLOGIC:  Alert and oriented x 3 PSYCHIATRIC:  Normal affect   ASSESSMENT:    1. Permanent atrial fibrillation ([place])   2. Essential hypertension, benign   3. Chronic venous insufficiency   4. Controlled type 2 diabetes mellitus with hyperglycemia, without long-term current use of insulin (HCC)    PLAN:    In order of problems listed above:  Permanent atrial fibrillation rate controlled she is on small dose of anticoagulation because of her advanced age and fragility.  She is only on 2.5 Eliquis twice daily.  Rate appears to be controlled.  She will have echocardiogram done to check left ventricle ejection fraction Essential hypertension blood pressure well-controlled today continue present management. Chronic venous insufficiency with chronic swelling of lower extremities.  Will ask her to have proBNP done to see what part of it could be related to her heart and what role diuretics play in this clinical situation. 4. Type 2 diabetes.  To be followed by antimedicine team.  Apparently stable 5.  Dyslipidemia was looking for fasting lipid profile last number I have is from December 2022 with LDL of 83.  She is on pravastatin 20 I will check direct LDL today  Medication  Adjustments/Labs and Tests  Ordered: Current medicines are reviewed at length with the patient today.  Concerns regarding medicines are outlined above.  No orders of the defined types were placed in this encounter.  Medication changes: No orders of the defined types were placed in this encounter.   Signed, Park Liter, MD, [facility] 02/22/2022 2:27 PM    [place]

## 2022-02-23 LAB — COMPREHENSIVE METABOLIC PANEL

## 2022-02-23 LAB — LDL CHOLESTEROL, DIRECT

## 2022-02-24 LAB — COMPREHENSIVE METABOLIC PANEL

## 2022-02-24 LAB — PRO B NATRIURETIC PEPTIDE: NT-Pro BNP: 280 pg/mL (ref 0–738)

## 2022-02-24 LAB — LDL CHOLESTEROL, DIRECT

## 2022-03-03 ENCOUNTER — Ambulatory Visit: Payer: Medicare Other | Admitting: Cardiology

## 2022-03-06 ENCOUNTER — Encounter: Payer: Self-pay | Admitting: Cardiology

## 2022-03-06 DIAGNOSIS — E785 Hyperlipidemia, unspecified: Secondary | ICD-10-CM

## 2022-03-06 DIAGNOSIS — I4821 Permanent atrial fibrillation: Secondary | ICD-10-CM

## 2022-03-07 MED ORDER — FUROSEMIDE 40 MG PO TABS: 40.0000 mg | ORAL_TABLET | Freq: Two times a day (BID) | ORAL | 3 refills | Status: DC

## 2022-03-07 MED ORDER — PRAVASTATIN SODIUM 20 MG PO TABS: 20.0000 mg | ORAL_TABLET | Freq: Every day | ORAL | 3 refills | Status: DC

## 2022-03-07 MED ORDER — COLCHICINE 0.6 MG PO TABS: 0.6000 mg | ORAL_TABLET | Freq: Every day | ORAL | 3 refills | Status: DC

## 2022-03-07 MED ORDER — METOPROLOL SUCCINATE ER 50 MG PO TB24: 50.0000 mg | ORAL_TABLET | ORAL | 0 refills | Status: DC

## 2022-03-07 MED ORDER — POTASSIUM CHLORIDE CRYS ER 20 MEQ PO TBCR: 20.0000 meq | EXTENDED_RELEASE_TABLET | Freq: Every day | ORAL | 3 refills | Status: DC

## 2022-03-07 MED ORDER — METOLAZONE 2.5 MG PO TABS: 2.5000 mg | ORAL_TABLET | ORAL | 3 refills | Status: DC

## 2022-03-07 MED ORDER — APIXABAN 2.5 MG PO TABS: 2.5000 mg | ORAL_TABLET | Freq: Two times a day (BID) | ORAL | 3 refills | Status: DC

## 2022-03-08 ENCOUNTER — Ambulatory Visit: Payer: Medicare Other | Admitting: Cardiology

## 2022-03-17 ENCOUNTER — Other Ambulatory Visit (HOSPITAL_BASED_OUTPATIENT_CLINIC_OR_DEPARTMENT_OTHER): Payer: Self-pay

## 2022-03-28 ENCOUNTER — Ambulatory Visit (HOSPITAL_BASED_OUTPATIENT_CLINIC_OR_DEPARTMENT_OTHER)
Admission: RE | Admit: 2022-03-28 | Discharge: 2022-03-28 | Disposition: A | Discharge status: Home / Self Care | Payer: Medicare Other | Source: Ambulatory Visit | Attending: Cardiology | Admitting: Cardiology

## 2022-03-28 DIAGNOSIS — R600 Localized edema: Secondary | ICD-10-CM

## 2022-03-28 DIAGNOSIS — R0609 Other forms of dyspnea: Secondary | ICD-10-CM | POA: Diagnosis present

## 2022-03-29 LAB — ECHOCARDIOGRAM COMPLETE
AR max vel: 1.95 cm2
AV Area VTI: 1.86 cm2
AV Area mean vel: 2.01 cm2
AV Mean grad: 2 mmHg
AV Peak grad: 4.7 mmHg
Ao pk vel: 1.08 m/s
Area-P 1/2: 7.29 cm2
S' Lateral: 2.3 cm

## 2022-04-04 ENCOUNTER — Telehealth: Payer: Self-pay

## 2022-04-04 NOTE — Telephone Encounter (Signed)
LVM per DPR- per Dr. Wendy Poet note regarding normal results. Encouraged to call with any questions. Routed to PCP.

## 2022-04-25 ENCOUNTER — Encounter: Payer: Self-pay | Admitting: Cardiology

## 2022-04-25 ENCOUNTER — Telehealth: Payer: Self-pay | Admitting: Cardiology

## 2022-04-25 NOTE — Telephone Encounter (Signed)
Left message for the patient to call back.

## 2022-04-25 NOTE — Telephone Encounter (Signed)
ERROR

## 2022-04-25 NOTE — Telephone Encounter (Signed)
Pt c/o swelling: STAT is pt has developed SOB within 24 hours  If swelling, where is the swelling located? Legs and feet   How much weight have you gained and in what time span? Not sure   Have you gained 3 pounds in a day or 5 pounds in a week? Not sure   Do you have a log of your daily weights (if so, list)? Did not take   Are you currently taking a fluid pill? Yes   Are you currently SOB? No   Have you traveled recently?  No   Pt daughter states pt has been swelling in her legs and ankles. Denies any SOB. Please advise.

## 2022-04-27 ENCOUNTER — Other Ambulatory Visit (HOSPITAL_BASED_OUTPATIENT_CLINIC_OR_DEPARTMENT_OTHER): Payer: Self-pay

## 2022-04-27 MED ORDER — AMOXICILLIN-POT CLAVULANATE 875-125 MG PO TABS
1.0000 | ORAL_TABLET | Freq: Two times a day (BID) | ORAL | 0 refills | Status: DC
  Filled 2022-04-27: qty 20, 10d supply, fill #0

## 2022-04-27 MED ORDER — CLOTRIMAZOLE-BETAMETHASONE 1-0.05 % EX CREA
TOPICAL_CREAM | CUTANEOUS | 1 refills | Status: DC
  Filled 2022-04-27: qty 15, 10d supply, fill #0

## 2022-05-04 ENCOUNTER — Other Ambulatory Visit (HOSPITAL_BASED_OUTPATIENT_CLINIC_OR_DEPARTMENT_OTHER): Payer: Self-pay

## 2022-05-04 MED ORDER — AMOXICILLIN-POT CLAVULANATE 875-125 MG PO TABS
1.0000 | ORAL_TABLET | Freq: Two times a day (BID) | ORAL | 0 refills | Status: DC
  Filled 2022-05-04: qty 20, 10d supply, fill #0

## 2022-05-04 MED ORDER — CLOTRIMAZOLE-BETAMETHASONE 1-0.05 % EX CREA
1.0000 | TOPICAL_CREAM | Freq: Every day | CUTANEOUS | 5 refills | Status: DC | PRN
  Filled 2022-05-04: qty 30, 15d supply, fill #0

## 2022-05-05 ENCOUNTER — Ambulatory Visit: Payer: Medicare Other | Admitting: Cardiology

## 2022-06-27 ENCOUNTER — Encounter: Payer: Self-pay | Admitting: Family Medicine

## 2022-06-27 ENCOUNTER — Ambulatory Visit (INDEPENDENT_AMBULATORY_CARE_PROVIDER_SITE_OTHER): Payer: Medicare Other | Admitting: Family Medicine

## 2022-06-27 VITALS — BP 110/71 | HR 76 | Ht <= 58 in | Wt 144.0 lb

## 2022-06-27 DIAGNOSIS — G2581 Restless legs syndrome: Secondary | ICD-10-CM

## 2022-06-27 DIAGNOSIS — I10 Essential (primary) hypertension: Secondary | ICD-10-CM | POA: Diagnosis not present

## 2022-06-27 DIAGNOSIS — M810 Age-related osteoporosis without current pathological fracture: Secondary | ICD-10-CM

## 2022-06-27 DIAGNOSIS — E1165 Type 2 diabetes mellitus with hyperglycemia: Secondary | ICD-10-CM | POA: Diagnosis not present

## 2022-06-27 DIAGNOSIS — K219 Gastro-esophageal reflux disease without esophagitis: Secondary | ICD-10-CM

## 2022-06-27 DIAGNOSIS — I509 Heart failure, unspecified: Secondary | ICD-10-CM

## 2022-06-27 DIAGNOSIS — N390 Urinary tract infection, site not specified: Secondary | ICD-10-CM

## 2022-06-27 DIAGNOSIS — E039 Hypothyroidism, unspecified: Secondary | ICD-10-CM | POA: Diagnosis not present

## 2022-06-27 DIAGNOSIS — Z7984 Long term (current) use of oral hypoglycemic drugs: Secondary | ICD-10-CM

## 2022-06-27 DIAGNOSIS — L97909 Non-pressure chronic ulcer of unspecified part of unspecified lower leg with unspecified severity: Secondary | ICD-10-CM

## 2022-06-27 DIAGNOSIS — I4821 Permanent atrial fibrillation: Secondary | ICD-10-CM | POA: Diagnosis not present

## 2022-06-27 DIAGNOSIS — N1832 Chronic kidney disease, stage 3b: Secondary | ICD-10-CM

## 2022-06-27 DIAGNOSIS — I83009 Varicose veins of unspecified lower extremity with ulcer of unspecified site: Secondary | ICD-10-CM

## 2022-06-27 DIAGNOSIS — E119 Type 2 diabetes mellitus without complications: Secondary | ICD-10-CM

## 2022-06-27 MED ORDER — OMEPRAZOLE 40 MG PO CPDR
40.0000 mg | DELAYED_RELEASE_CAPSULE | Freq: Every morning | ORAL | 1 refills | Status: DC
Start: 2022-06-27 — End: 2022-12-26

## 2022-06-27 MED ORDER — SULFAMETHOXAZOLE-TRIMETHOPRIM 400-80 MG PO TABS: ORAL_TABLET | ORAL | 1 refills | Status: DC

## 2022-06-27 MED ORDER — FAMOTIDINE 40 MG PO TABS
ORAL_TABLET | ORAL | 1 refills | Status: DC
Start: 2022-06-27 — End: 2023-01-23

## 2022-06-27 MED ORDER — METFORMIN HCL ER 500 MG PO TB24
500.0000 mg | ORAL_TABLET | Freq: Every day | ORAL | 0 refills | Status: DC
Start: 2022-06-27 — End: 2022-09-27

## 2022-06-27 MED ORDER — PRAMIPEXOLE DIHYDROCHLORIDE 1 MG PO TABS
ORAL_TABLET | ORAL | 1 refills | Status: DC
Start: 2022-06-27 — End: 2023-01-23

## 2022-06-27 NOTE — Patient Instructions (Signed)
Thank you for choosing [facility] for your Primary Care needs. I am excited for the opportunity to partner with you to meet your health care goals. It was a pleasure meeting you today!  Information on diet, exercise, and health maintenance recommendations are listed below. This is information to help you be sure you are on track for optimal health and monitoring.   Please look over this and let us know if you have any questions or if you have completed any of the health maintenance outside of [facility] so that we can be sure your records are up to date.  ___________________________________________________________  MyChart:  For all urgent or time sensitive needs we ask that you please call the office to avoid delays. Our number is [phone]. MyChart is not constantly monitored and due to the large volume of messages a day, replies may take up to 72 business hours.  MyChart Policy: MyChart allows for you to see your visit notes, after visit summary, provider recommendations, lab and tests results, make an appointment, request refills, and contact your provider or the office for non-urgent questions or concerns. Providers are seeing patients during normal business hours and do not have built in time to review MyChart messages.  We ask that you allow a minimum of 3 business days for responses to MyChart messages. For this reason, please do not send urgent requests through MyChart. Please call the office at [phone]. New and ongoing conditions may require a visit. We have virtual and in-person visits available for your convenience.  Complex MyChart concerns may require a visit. Your provider may request you schedule a virtual or in-person visit to ensure we are providing the best care possible. MyChart messages sent after 11:00 AM on Friday will not be received by the provider until Monday morning.    Lab and Test Results: You will receive your lab and test  results on MyChart as soon as they are completed and results have been sent by the lab or testing facility. Due to this service, you will receive your results BEFORE your provider.  I review lab and test results each morning prior to seeing patients. Some results require collaboration with other providers to ensure you are receiving the most appropriate care. For this reason, we ask that you please allow a minimum of 3-5 business days from the time that ALL results have been received for your provider to receive and review lab and test results and contact you about these.  Most lab and test result comments from the provider will be sent through MyChart. Your provider may recommend changes to the plan of care, follow-up visits, repeat testing, ask questions, or request an office visit to discuss these results. You may reply directly to this message or call the office to provide information for the provider or set up an appointment. In some instances, you will be called with test results and recommendations. Please let us know if this is preferred and we will make note of this in your chart to provide this for you.    If you have not heard a response to your lab or test results in 5 business days from all results returning to MyChart, please call the office to let us know. We ask that you please avoid calling prior to this time unless there is an emergent concern. Due to high call volumes, this can delay the resulting process.  After Hours: For all non-emergency after hours needs, please   call the office at [phone] and select the option to reach the on-call  service. On-call services are shared between multiple [facility] offices and therefore it will not be possible to speak directly with your provider. On-call providers may provide medical advice and recommendations, but are unable to provide refills for maintenance medications.  For all emergency or urgent medical needs after normal business hours, we  recommend that you seek care at the closest Urgent Care or Emergency Department to ensure appropriate treatment in a timely manner.  [facility] has a 24 hour emergency room located on the ground floor for your convenience.   Urgent Concerns During the Business Day Providers are seeing patients from 8AM to 5PM with a busy schedule and are most often not able to respond to non-urgent calls until the end of the day or the next business day. If you should have URGENT concerns during the day, please call and speak to the nurse or schedule a same day appointment so that we can address your concern without delay.   Thank you, again, for choosing me as your health care partner. I appreciate your trust and look forward to learning more about you!   Tyjai Charbonnet B. Jacolyn Joaquin, DNP, FNP-C  ___________________________________________________________  Health Maintenance Recommendations Screening Testing Mammogram Every 1-2 years based on history and risk factors Starting at age 50 Pap Smear Ages 21-39 every 3 years Ages 30-65 every 5 years with HPV testing More frequent testing may be required based on results and history Colon Cancer Screening Every 1-10 years based on test performed, risk factors, and history Starting at age 45 Bone Density Screening Every 2-10 years based on history Starting at age 65 for women Recommendations for men differ based on medication usage, history, and risk factors AAA Screening One time ultrasound Men 65-75 years old who have ever smoked Lung Cancer Screening Low Dose Lung CT every 12 months Age 50-80 years with a 20 pack-year smoking history who still smoke or who have quit within the last 15 years  Screening Labs Routine  Labs: Complete Blood Count (CBC), Complete Metabolic Panel (CMP), Cholesterol (Lipid Panel) Every 6-12 months based on history and medications May be recommended more frequently based on current conditions or previous results Hemoglobin  A1c Lab Every 3-12 months based on history and previous results Starting at age 45 or earlier with diagnosis of diabetes, high cholesterol, BMI >26, and/or risk factors Frequent monitoring for patients with diabetes to ensure blood sugar control Thyroid Panel  Every 6 months based on history, symptoms, and risk factors May be repeated more often if on medication HIV One time testing for all patients 13 and older May be repeated more frequently for patients with increased risk factors or exposure Hepatitis C One time testing for all patients 18 and older May be repeated more frequently for patients with increased risk factors or exposure Gonorrhea, Chlamydia Every 12 months for all sexually active persons 13-24 years Additional monitoring may be recommended for those who are considered high risk or who have symptoms PSA Men 40-54 years old with risk factors Additional screening may be recommended from age 55-69 based on risk factors, symptoms, and history  Vaccine Recommendations Tetanus Booster All adults every 10 years Flu Vaccine All patients 6 months and older every year COVID Vaccine All patients 12 years and older Initial dosing with booster May recommend additional booster based on age and health history HPV Vaccine 2 doses all patients age 9-26 Dosing may be considered   for patients over 26 Shingles Vaccine (Shingrix) 2 doses all adults 50 years and older Pneumonia (Pneumovax 23) All adults 65 years and older May recommend earlier dosing based on health history Pneumonia (Prevnar 13) All adults 65 years and older Dosed 1 year after Pneumovax 23 Pneumonia (Prevnar 20) All adults 65 years and older (adults 19-64 with certain conditions or risk factors) 1 dose  For those who have not received Prevnar 13 vaccine previously   Additional Screening, Testing, and Vaccinations may be recommended on an individualized basis based on family history, health history, risk  factors, and/or exposure.  __________________________________________________________  Diet Recommendations for All Patients  I recommend that all patients maintain a diet low in saturated fats, carbohydrates, and cholesterol. While this can be challenging at first, it is not impossible and small changes can make big differences.  Things to try: Decreasing the amount of soda, sweet tea, and/or juice to one or less per day and replace with water While water is always the first choice, if you do not like water you may consider adding a water additive without sugar to improve the taste other sugar free drinks Replace potatoes with a brightly colored vegetable  Use healthy oils, such as canola oil or olive oil, instead of butter or hard margarine Limit your bread intake to two pieces or less a day Replace regular pasta with low carb pasta options Bake, broil, or grill foods instead of frying Monitor portion sizes  Eat smaller, more frequent meals throughout the day instead of large meals  An important thing to remember is, if you love foods that are not great for your health, you don't have to give them up completely. Instead, allow these foods to be a reward when you have done well. Allowing yourself to still have special treats every once in a while is a nice way to tell yourself thank you for working hard to keep yourself healthy.   Also remember that every day is a new day. If you have a bad day and "fall off the wagon", you can still climb right back up and keep moving along on your journey!  We have resources available to help you!  Some websites that may be helpful include: www.MyPlate.gov  Www.VeryWellFit.com _____________________________________________________________  Activity Recommendations for All Patients  I recommend that all adults get at least 20 minutes of moderate physical activity that elevates your heart rate at least 5 days out of the week.  Some examples  include: Walking or jogging at a pace that allows you to carry on a conversation Cycling (stationary bike or outdoors) Water aerobics Yoga Weight lifting Dancing If physical limitations prevent you from putting stress on your joints, exercise in a pool or seated in a chair are excellent options.  Do determine your MAXIMUM heart rate for activity: 220 - YOUR AGE = MAX Heart Rate   Remember! Do not push yourself too hard.  Start slowly and build up your pace, speed, weight, time in exercise, etc.  Allow your body to rest between exercise and get good sleep. You will need more water than normal when you are exerting yourself. Do not wait until you are thirsty to drink. Drink with a purpose of getting in at least 8, 8 ounce glasses of water a day plus more depending on how much you exercise and sweat.    If you begin to develop dizziness, chest pain, abdominal pain, jaw pain, shortness of breath, headache, vision changes, lightheadedness, or other concerning symptoms,   stop the activity and allow your body to rest. If your symptoms are severe, seek emergency evaluation immediately. If your symptoms are concerning, but not severe, please let us know so that we can recommend further evaluation.     

## 2022-06-27 NOTE — Assessment & Plan Note (Signed)
Previously well controlled Continue Synthroid at current dose  Recheck TSH and adjust Synthroid as indicated   

## 2022-06-27 NOTE — Assessment & Plan Note (Signed)
Stable today. Asymptomatic. Continue current meds and following with cardiology. Updating labs today

## 2022-06-27 NOTE — Assessment & Plan Note (Signed)
Normal BMD July 2023 Discussed supportive measures, continue supplementation

## 2022-06-27 NOTE — Assessment & Plan Note (Signed)
Blood pressure is at goal for age and co-morbidities.   Recommendations: continue metoprolol  - BP goal <130/80 - monitor and log blood pressures at home - check around the same time each day in a relaxed setting - Limit salt to <2000 mg/day - Follow DASH eating plan (heart healthy diet) - limit alcohol to 2 standard drinks per day for men and 1 per day for women - avoid tobacco products - get at least 2 hours of regular aerobic exercise weekly Patient aware of signs/symptoms requiring further/urgent evaluation. Labs updated today. Following with cardiology

## 2022-06-27 NOTE — Assessment & Plan Note (Signed)
Stable rate today. Asymptomatic. Continue current meds and following with cardiology.

## 2022-06-27 NOTE — Assessment & Plan Note (Signed)
No acute concerns today. Following with [facility].  Declined assessment today, compression socks in place

## 2022-06-27 NOTE — Assessment & Plan Note (Signed)
Doing well on Bactrim for prevention. No acute concerns.

## 2022-06-27 NOTE — Progress Notes (Addendum)
New Patient Office Visit  Subjective    Patient ID: Dana Bradford, female    DOB: 15-Nov-1930  Age: 89 y.o. MRN: [id number]  CC:  Chief Complaint  Patient presents with   Establish Care    HPI Annete Jeremy presents to establish care. She was previously with PCP at Dr. Camie Patience office. She lives alone, but across the street from her daughter.   She has several chronic conditions as described below, but no acute concerns today.   A.fib, CHF, HTN: - Eliquis 2.5 mg BID, metoprolol succinate 50 mg daily, furosemide 40 mg BID (potassium 20 mEq daily), metolazone 2.5 mg weekly, pravastatin 20 mg daily - Diagnosed with Afib about 3 years ago. She follows with [facility] every 6 months or so.  - Reports occasional BLE edema, but well controlled overall. Wears compression socks. States she follows with the wound clinic for venous stasis ulcers and infectious disease recently started her on daily cefadroxil 500 mg - seeing them again later this month. No new concerns.   Gout: - colchicine 0.6 mg daily (mostly flares in thumbs, no problems recently)  Iron deficiency: - supplementation twice daily 3 days per week   Hypothyroidism: - levothyroxine 88 mcg daily  - asymptomatic. No concerns today.   Diabetes: - metformin 500 mg with dinner - Reports stable and no symptoms. She eats a general diet mostly.   GERD: - Reports well controlled on Pepcid, Prilosec and lifestyle measures.   Restless legs: - She gets good relief with pramipexole 1 mg nightly. No new concerns.   Hx of frequently UTIs: - She has been taking bactrim 400-80 mg, half tablet three times per week and that seems to be decreasing UTIs. She reports history of vaginal prolapse as well.      Outpatient Encounter Medications as of 06/27/2022  Medication Sig   apixaban (ELIQUIS) 2.5 MG TABS tablet Take 1 tablet (2.5 mg total) by mouth 2 (two) times daily.   cefadroxil (DURICEF) 500 MG capsule Take 500 mg by mouth  daily.   Cholecalciferol (VITAMIN D3) 50 MCG (2000 UT) TABS Take 2,000 Units by mouth daily.   clotrimazole-betamethasone (LOTRISONE) cream Apply 1 Application topically daily as needed or as directed   colchicine 0.6 MG tablet Take 1 tablet (0.6 mg total) by mouth daily.   ferrous sulfate 325 (65 FE) MG tablet Take 325 mg by mouth 2 (two) times daily with a meal.   furosemide (LASIX) 40 MG tablet Take 1 tablet (40 mg total) by mouth 2 (two) times daily.   metolazone (ZAROXOLYN) 2.5 MG tablet Take 1 tablet (2.5 mg total) by mouth once a week. monday's   metoprolol succinate (TOPROL-XL) 50 MG 24 hr tablet Take 1 tablet (50 mg total) by mouth as directed. TAKE 1 TABLET IN THE MORNING AND ONE-HALF (1/2) TABLET IN THE EVENING, DAILY   potassium chloride SA (KLOR-CON M20) 20 MEQ tablet Take 1 tablet (20 mEq total) by mouth daily.   pravastatin (PRAVACHOL) 20 MG tablet Take 1 tablet (20 mg total) by mouth daily.   triamcinolone ointment (KENALOG) 0.1 % Apply 1 application topically 2 (two) times daily as needed (skin irritation.).   vitamin C (ASCORBIC ACID) 500 MG tablet Take 500 mg by mouth 3 (three) times a week. Monday , Wednesday and Friday   [DISCONTINUED] famotidine (PEPCID) 40 MG tablet Take 1 tablet (40 mg total) by mouth nightly as needed for Heartburn. (Patient taking differently: Take 40 mg by mouth at bedtime as  needed for heartburn.)   [DISCONTINUED] levothyroxine (SYNTHROID) 88 MCG tablet Take 1 tablet (88 mcg total) by mouth daily. (Patient taking differently: Take 88 mcg by mouth daily before breakfast.)   [DISCONTINUED] metFORMIN (GLUCOPHAGE-XR) 500 MG 24 hr tablet Take 1-2 tablets by mouth at bedtime (Patient taking differently: Take 500 mg by mouth at bedtime.)   [DISCONTINUED] omeprazole (PRILOSEC) 40 MG capsule Take 1 capsule by mouth in the morning.   [DISCONTINUED] pramipexole (MIRAPEX) 1 MG tablet Take 1 tablet by mouth every night (Patient taking differently: Take 1 mg by mouth at  bedtime as needed (Restless legs).)   [DISCONTINUED] sulfamethoxazole-trimethoprim (BACTRIM) 400-80 MG tablet Take 1/2 tablet by mouth, 3 times weekly. (Patient taking differently: Take 0.5 tablets by mouth 3 (three) times a week.)   famotidine (PEPCID) 40 MG tablet Take 1 tablet (40 mg total) by mouth nightly as needed for Heartburn.   levothyroxine (SYNTHROID) 88 MCG tablet Take 1 tablet (88 mcg total) by mouth daily before breakfast.   metFORMIN (GLUCOPHAGE-XR) 500 MG 24 hr tablet Take 1 tablet (500 mg total) by mouth at bedtime.   omeprazole (PRILOSEC) 40 MG capsule Take 1 capsule (40 mg total) by mouth in the morning.   pramipexole (MIRAPEX) 1 MG tablet Take 1 tablet by mouth every night   sulfamethoxazole-trimethoprim (BACTRIM) 400-80 MG tablet Take 1/2 tablet by mouth, 3 times weekly.   [DISCONTINUED] amoxicillin-clavulanate (AUGMENTIN) 875-125 MG tablet Take 1 tablet by mouth 2 (two) times daily for 10 days   [DISCONTINUED] clotrimazole-betamethasone (LOTRISONE) cream Apply topically to affected area daily-bilateral lower legs   No facility-administered encounter medications on file as of 06/27/2022.    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 , right eye some vision loss   Venous stasis ulcer (HCC)     Past Surgical History:  Procedure Laterality Date   ABDOMINAL HYSTERECTOMY  1993   Uterine Prolapse partial   MASTECTOMY  1974   right breast not sure if cancer 1960's   REPLACEMENT TOTAL KNEE BILATERAL  Bilateral    SMALL TOE RIGHT FOOT REMOVED  04/2017   DUE TO INFECTION   Spleenectomy  2000   TOTAL HIP ARTHROPLASTY Right 02/28/2018   Procedure: RIGHT TOTAL HIP ARTHROPLASTY ANTERIOR APPROACH;  Surgeon: Ollen Gross, MD;  Location: [facility];  Service: Orthopedics;  Laterality: Right;    TOTAL HIP ARTHROPLASTY Left 01/22/2020   Procedure: TOTAL HIP ARTHROPLASTY ANTERIOR APPROACH;  Surgeon: Ollen Gross, MD;  Location: [facility];  Service: Orthopedics;  Laterality: Left;    TOTAL KNEE ARTHROPLASTY WITH REVISION COMPONENTS      Family History  Problem Relation Age of Onset   Cancer Mother    Heart disease Father    Hyperlipidemia Father    Aneurysm Sister    Kidney disease Brother     Social  History   Socioeconomic History   Marital status: Widowed    Spouse name: Not on file   Number of children: 2   Years of education: 28   Highest education level: Not on file  Occupational History   Occupation: Retired   Tobacco Use   Smoking status: Former    Packs/day: 1.00    Years: 30.00    Additional pack years: 0.00    Total pack years: 30.00    Types: Cigarettes    Quit date: 1974    Years since quitting: 50.4    Passive exposure: Past   Smokeless tobacco: Never   Tobacco comments:    qiot 50 yrs ago  Vaping Use   Vaping Use: Never used  Substance and Sexual Activity   Alcohol use: Not Currently   Drug use: No   Sexual activity: Not Currently    Birth control/protection: Surgical    Comment: Hysterectomy  Other Topics Concern   Not on file  Social History Narrative   Marital Status: Widowed    Children:  Daughter Skipper Cliche) Son Nickisha Keelin)   Pets: None    Living Situation: Lives alone    Occupation: Retired    Education: Engineer, agricultural    Tobacco:  She quit smoking 35 years ago after having smoked 1 ppd for 20 years.      Alcohol Use:  One glass of wine twice a week     Drug Use:  None   Diet:  Regular   Exercise:  None   Hobbies:  Crafting, Gardening.             Social Determinants of Health   Financial Resource Strain: Not on file  Food Insecurity: Not on file  Transportation Needs: Not on file  Physical Activity: Not on file  Stress: Not on file  Social Connections: Not on file  Intimate Partner Violence: Not on file    ROS All review of systems negative except what is listed in the HPI      Objective    BP 110/71   Pulse 76   Ht 4\' 10"  (1.473 m)   Wt 144 lb (65.3 kg)   SpO2 100%   BMI 30.10 kg/m   Physical Exam Vitals reviewed.  Constitutional:      Appearance: Normal appearance. She is not ill-appearing.  Cardiovascular:     Rate and Rhythm: Normal rate. Rhythm irregular.     Pulses: Normal pulses.     Heart sounds: Normal heart sounds.  Pulmonary:     Effort: Pulmonary effort is normal.     Breath sounds: Normal breath sounds.  Musculoskeletal:     Right lower leg: No edema.     Left lower leg: No edema.  Skin:    General: Skin is warm and dry.  Neurological:     Mental Status: She is alert and oriented to person, place, and time.  Psychiatric:        Mood and Affect: Mood normal.        Behavior: Behavior normal.        Thought Content: Thought content normal.        Judgment: Judgment normal.            Assessment & Plan:   Problem List Items Addressed This Visit     Restless leg syndrome    Doing well on Mirapex. No new concerns.       Relevant Medications   pramipexole (MIRAPEX) 1  MG tablet   Essential hypertension, benign    Blood pressure is at goal for age and co-morbidities.   Recommendations: continue metoprolol  - BP goal <130/80 - monitor and log blood pressures at home - check around the same time each day in a relaxed setting - Limit salt to <2000 mg/day - Follow DASH eating plan (heart healthy diet) - limit alcohol to 2 standard drinks per day for men and 1 per day for women - avoid tobacco products - get at least 2 hours of regular aerobic  exercise weekly Patient aware of signs/symptoms requiring further/urgent evaluation. Labs updated today. Following with cardiology       Relevant Orders   CBC with Differential/Platelet (Completed)   Comprehensive metabolic panel (Completed)   Lipid panel (Completed)   TSH (Completed)   GERD (gastroesophageal reflux disease)   Relevant Medications   famotidine (PEPCID) 40 MG tablet   omeprazole (PRILOSEC) 40 MG capsule   Hypothyroidism (Chronic)    Previously well controlled Continue Synthroid at current dose  Recheck TSH and adjust Synthroid as indicated       Relevant Medications   levothyroxine (SYNTHROID) 88 MCG tablet   CHF (congestive heart failure) (HCC)    Stable today. Asymptomatic. Continue current meds and following with cardiology. Updating labs today       Relevant Orders   Comprehensive metabolic panel (Completed)   Pro b natriuretic peptide (Completed)   Venous stasis ulcer (HCC) (Chronic)    No acute concerns today. Following with [facility].  Declined assessment today, compression socks in place       Permanent atrial fibrillation (HCC) - Primary    Stable rate today. Asymptomatic. Continue current meds and following with cardiology.       Controlled type 2 diabetes mellitus with hyperglycemia, without long-term current use of insulin (HCC) (Chronic)    Labs today Refilling metformin  Discussed lifestyle measures       Relevant Medications   metFORMIN (GLUCOPHAGE-XR) 500 MG 24 hr tablet   Osteoporosis (Chronic)    Normal BMD July 2023 Discussed supportive measures, continue supplementation       Frequent UTI    Doing well on Bactrim for prevention. No acute concerns.       Relevant Medications   cefadroxil (DURICEF) 500 MG capsule   sulfamethoxazole-trimethoprim (BACTRIM) 400-80 MG tablet   Stage 3b chronic kidney disease (HCC)   Relevant Orders   Basic metabolic panel    Return in about 6 months (around  12/28/2022) for routine follow-up; schedle AWV.   Clayborne Dana, NP

## 2022-06-27 NOTE — Assessment & Plan Note (Signed)
Labs today Refilling metformin  Discussed lifestyle measures

## 2022-06-27 NOTE — Assessment & Plan Note (Signed)
Doing well on Mirapex. No new concerns.

## 2022-06-28 LAB — CBC WITH DIFFERENTIAL/PLATELET
Basophils Absolute: 0 10*3/uL (ref 0.0–0.1)
Basophils Relative: 0.3 % (ref 0.0–3.0)
Eosinophils Absolute: 0.2 10*3/uL (ref 0.0–0.7)
Eosinophils Relative: 3.7 % (ref 0.0–5.0)
HCT: 40.7 % (ref 36.0–46.0)
Hemoglobin: 13.9 g/dL (ref 12.0–15.0)
Lymphocytes Relative: 45.8 % (ref 12.0–46.0)
Lymphs Abs: 2.7 10*3/uL (ref 0.7–4.0)
MCHC: 34.1 g/dL (ref 30.0–36.0)
MCV: 96 fl (ref 78.0–100.0)
Monocytes Absolute: 0.6 10*3/uL (ref 0.1–1.0)
Monocytes Relative: 10.9 % (ref 3.0–12.0)
Neutro Abs: 2.3 10*3/uL (ref 1.4–7.7)
Neutrophils Relative %: 39.3 % — ABNORMAL LOW (ref 43.0–77.0)
Platelets: 222 10*3/uL (ref 150.0–400.0)
RBC: 4.24 Mil/uL (ref 3.87–5.11)
RDW: 13.7 % (ref 11.5–15.5)
WBC: 5.9 10*3/uL (ref 4.0–10.5)

## 2022-06-28 LAB — LIPID PANEL
Cholesterol: 148 mg/dL (ref 0–200)
HDL: 53.1 mg/dL (ref >39.00)
LDL Cholesterol: 71 mg/dL (ref 0–99)
NonHDL: 94.68
Total CHOL/HDL Ratio: 3
Triglycerides: 116 mg/dL (ref 0.0–149.0)
VLDL: 23.2 mg/dL (ref 0.0–40.0)

## 2022-06-28 LAB — COMPREHENSIVE METABOLIC PANEL
ALT: 20 U/L (ref 0–35)
AST: 20 U/L (ref 0–37)
Albumin: 4 g/dL (ref 3.5–5.2)
Alkaline Phosphatase: 69 U/L (ref 39–117)
BUN: 30 mg/dL — ABNORMAL HIGH (ref 6–23)
CO2: 31 mEq/L (ref 19–32)
Calcium: 8.9 mg/dL (ref 8.4–10.5)
Chloride: 98 mEq/L (ref 96–112)
Creatinine, Ser: 1.21 mg/dL — ABNORMAL HIGH (ref 0.40–1.20)
GFR: 39.66 mL/min — ABNORMAL LOW (ref >60.00)
Glucose, Bld: 92 mg/dL (ref 70–99)
Potassium: 3.5 mEq/L (ref 3.5–5.1)
Sodium: 138 mEq/L (ref 135–145)
Total Bilirubin: 0.5 mg/dL (ref 0.2–1.2)
Total Protein: 7.2 g/dL (ref 6.0–8.3)

## 2022-06-28 LAB — HEMOGLOBIN A1C: Hgb A1c MFr Bld: 6.5 % (ref 4.6–6.5)

## 2022-06-28 LAB — TSH: TSH: 0.48 u[IU]/mL (ref 0.35–5.50)

## 2022-06-29 LAB — PRO B NATRIURETIC PEPTIDE: NT-Pro BNP: 2696 pg/mL — ABNORMAL HIGH (ref 0–738)

## 2022-06-29 MED ORDER — LEVOTHYROXINE SODIUM 88 MCG PO TABS
88.0000 ug | ORAL_TABLET | Freq: Every day | ORAL | 3 refills | Status: DC
Start: 2022-06-29 — End: 2023-06-26

## 2022-06-29 NOTE — Addendum Note (Signed)
Addended by: Hyman Hopes B on: 06/29/2022 08:31 AM   Modules accepted: Orders

## 2022-07-01 ENCOUNTER — Telehealth: Payer: Self-pay

## 2022-07-01 NOTE — Telephone Encounter (Signed)
I tried calling the patient daughter for several days now regarding BMS application. I will wait until she returns my call.

## 2022-07-07 ENCOUNTER — Other Ambulatory Visit: Payer: Self-pay | Admitting: Cardiology

## 2022-07-28 ENCOUNTER — Encounter: Payer: Self-pay | Admitting: Family Medicine

## 2022-07-28 ENCOUNTER — Telehealth: Payer: Self-pay | Admitting: Family Medicine

## 2022-07-28 NOTE — Telephone Encounter (Signed)
Will need an appt here for an exam and then we will order what is needed. Thanks!

## 2022-07-28 NOTE — Telephone Encounter (Signed)
Patients daughter has called stating they have found a lump in mothers breast and would like to know the next step as far as getting a mammogram or what she should do. Please advise   Dana [phone]

## 2022-08-01 ENCOUNTER — Encounter: Payer: Self-pay | Admitting: Family Medicine

## 2022-08-01 ENCOUNTER — Ambulatory Visit (INDEPENDENT_AMBULATORY_CARE_PROVIDER_SITE_OTHER): Payer: Medicare Other | Admitting: Family Medicine

## 2022-08-01 VITALS — BP 136/84 | HR 89 | Ht <= 58 in | Wt 150.0 lb

## 2022-08-01 DIAGNOSIS — N6321 Unspecified lump in the left breast, upper outer quadrant: Secondary | ICD-10-CM | POA: Diagnosis not present

## 2022-08-01 NOTE — Progress Notes (Signed)
   Acute Office Visit  Subjective:     Patient ID: Dana Bradford, female    DOB: 1930-11-28, 89 y.o.   MRN: [id number]  Chief Complaint  Patient presents with   Breast Mass    HPI Patient is in today for breast concern.   Discussed the use of AI scribe software for clinical note transcription with the patient, who gave verbal consent to proceed.  History of Present Illness   The patient, with a history of right-sided mastectomy 60 years ago, presents with a new left-sided breast lump discovered one week ago. The lump, described as the size of a walnut, is located in the upper left quadrant of the breast. It is non-tender and was discovered incidentally when the patient had an itch. There are no associated skin changes, nipple changes, or discharge. The patient has never had a mammogram or ultrasound of the left breast.  The patient underwent a right-sided mastectomy at the age of 24, with no follow-up treatment or explanation given. The patient's daughter reports that a biopsy was performed prior to the mastectomy, but the patient was never given the results. The patient has no known history of lymph node involvement.         ROS All review of systems negative except what is listed in the HPI      Objective:    BP 136/84   Pulse 89   Ht 4\' 10"  (1.473 m)   Wt 150 lb (68 kg)   SpO2 98%   BMI 31.35 kg/m    Physical Exam Vitals reviewed.  Constitutional:      Appearance: Normal appearance. She is not ill-appearing.  Cardiovascular:     Rate and Rhythm: Normal rate. Rhythm irregular.     Pulses: Normal pulses.     Heart sounds: Normal heart sounds.  Pulmonary:     Effort: Pulmonary effort is normal.     Breath sounds: Normal breath sounds.  Chest:     Chest wall: No swelling or tenderness.  Breasts:    Right: Absent.     Left: No swelling, nipple discharge or skin change.    Musculoskeletal:     Right lower leg: No edema.     Left lower leg: No edema.   Skin:    General: Skin is warm and dry.  Neurological:     Mental Status: She is alert and oriented to person, place, and time.  Psychiatric:        Mood and Affect: Mood normal.        Behavior: Behavior normal.        Thought Content: Thought content normal.        Judgment: Judgment normal.     No results found for any visits on 08/01/22.      Assessment & Plan:   Problem List Items Addressed This Visit   None Visit Diagnoses     Mass of upper outer quadrant of left breast    -  Primary Mammogram to evaluated. Patient aware of signs/symptoms requiring further/urgent evaluation.    Relevant Orders   MM 3D DIAGNOSTIC MAMMOGRAM UNILATERAL LEFT BREAST         No orders of the defined types were placed in this encounter.   Return if symptoms worsen or fail to improve.  Clayborne Dana, NP

## 2022-08-07 ENCOUNTER — Encounter: Payer: Self-pay | Admitting: Family Medicine

## 2022-08-08 ENCOUNTER — Other Ambulatory Visit: Payer: Self-pay | Admitting: Family Medicine

## 2022-08-08 DIAGNOSIS — N6321 Unspecified lump in the left breast, upper outer quadrant: Secondary | ICD-10-CM

## 2022-08-09 ENCOUNTER — Telehealth: Payer: Self-pay

## 2022-08-09 ENCOUNTER — Ambulatory Visit: Payer: Medicare Other | Admitting: Cardiology

## 2022-08-09 ENCOUNTER — Telehealth: Payer: Self-pay | Admitting: Cardiology

## 2022-08-09 NOTE — Telephone Encounter (Signed)
Patient daughter came by [facility] office on 08/08/22- stated she dropped off a patient assistance form for [employer] 6 weeks ago to be completed and returned to pt so they can mail/fax. Requesting call back regarding this.

## 2022-08-09 NOTE — Telephone Encounter (Signed)
Message addressed earlier 

## 2022-08-09 NOTE — Telephone Encounter (Addendum)
Spoke with Dana Bradford, she is aware proof of income needed, they do not wish to provide. I explained if documents are sent separately, my experience is the company usually delays processing the application. She will speak to the patient and decide what direction she want to go. I made miss Dana Bradford aware I had made multiple attempt to reach out to her in May as she requested on the documents she dropped off.

## 2022-08-15 ENCOUNTER — Ambulatory Visit
Admission: RE | Admit: 2022-08-15 | Discharge: 2022-08-15 | Disposition: A | Discharge status: Home / Self Care | Payer: Medicare Other | Source: Ambulatory Visit | Attending: Family Medicine | Admitting: Family Medicine

## 2022-08-15 ENCOUNTER — Other Ambulatory Visit: Payer: Self-pay | Admitting: Family Medicine

## 2022-08-15 DIAGNOSIS — N6321 Unspecified lump in the left breast, upper outer quadrant: Secondary | ICD-10-CM

## 2022-08-26 ENCOUNTER — Ambulatory Visit
Admission: RE | Admit: 2022-08-26 | Discharge: 2022-08-26 | Disposition: A | Discharge status: Home / Self Care | Payer: Medicare Other | Source: Ambulatory Visit | Attending: Family Medicine | Admitting: Family Medicine

## 2022-08-26 DIAGNOSIS — N6321 Unspecified lump in the left breast, upper outer quadrant: Secondary | ICD-10-CM

## 2022-08-26 HISTORY — PX: BREAST BIOPSY: SHX20

## 2022-08-31 DIAGNOSIS — H6123 Impacted cerumen, bilateral: Secondary | ICD-10-CM

## 2022-08-31 HISTORY — DX: Impacted cerumen, bilateral: H61.23

## 2022-09-13 ENCOUNTER — Other Ambulatory Visit: Payer: Self-pay | Admitting: Surgery

## 2022-09-13 DIAGNOSIS — Z853 Personal history of malignant neoplasm of breast: Secondary | ICD-10-CM

## 2022-09-14 ENCOUNTER — Telehealth: Payer: Self-pay | Admitting: Radiation Oncology

## 2022-09-14 NOTE — Telephone Encounter (Signed)
Called patient's daughter to schedule patient for a consultation w. Dr. Mitzi Hansen. Scheduled patient, appointment delay due to patient's daughter requesting next available afternoon appointment.

## 2022-09-26 ENCOUNTER — Other Ambulatory Visit: Payer: Self-pay | Admitting: Family Medicine

## 2022-09-26 DIAGNOSIS — C50812 Malignant neoplasm of overlapping sites of left female breast: Secondary | ICD-10-CM | POA: Insufficient documentation

## 2022-09-26 DIAGNOSIS — E119 Type 2 diabetes mellitus without complications: Secondary | ICD-10-CM

## 2022-09-26 HISTORY — DX: Malignant neoplasm of overlapping sites of left female breast: C50.812

## 2022-09-26 NOTE — Progress Notes (Signed)
Radiation Oncology         ([phone]) [phone] ________________________________  Name: Dana Bradford        MRN: [id number]  Date of Service: 09/29/2022 DOB: 07/13/1930  WU:[id number], [name], NP  Abigail Miyamoto, MD     REFERRING PHYSICIAN: Abigail Miyamoto, MD   DIAGNOSIS: The encounter diagnosis was Malignant neoplasm of overlapping sites of left breast in female, estrogen receptor positive (HCC).   HISTORY OF PRESENT ILLNESS: Dana Bradford is a 89 y.o. female seen at the request of Dr. Magnus Ivan for a new diagnosis of left breast cancer. The patient has a history of right breast cancer treated with mastectomy more than 50 years ago. It is unclear if she had additional therapy. *** She was noted to have a palpable mass noted a few weeks ago and this was noted on diagnostic imaging in the 12:00 position of the left breast. This measured 2 cm, and the axilla was negative for adenopathy. She also had a second lesion that was biopsied on 08/26/22 and benign and felt to be concordant. A biopsy of the 12:00 site showed grade 2 invasive ductal carcinoma and the tumor was ER/PR positive, HER2 negative with Ki 67 of 20%. She has been offered a left lumpectomy and will see cardiology prior for clearance to undergo surgery. She's seen to discuss adjuvant treatment.     PREVIOUS RADIATION THERAPY: {EXAM; YES/NO:19492::"No"}   PAST MEDICAL HISTORY:  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 , right eye some vision loss   Venous stasis  ulcer (HCC)        PAST SURGICAL HISTORY: Past Surgical History:  Procedure Laterality Date   ABDOMINAL HYSTERECTOMY  1993   Uterine Prolapse partial   BREAST BIOPSY Left 08/26/2022   MM LT BREAST BX W LOC DEV 1ST LESION IMAGE BX SPEC STEREO GUIDE 08/26/2022 [facility]   BREAST BIOPSY Left 08/26/2022   [place] LT BREAST BX W LOC DEV 1ST LESION IMG BX SPEC US GUIDE 08/26/2022 [facility]   MASTECTOMY  1974   right breast not sure if cancer 1960's   REPLACEMENT TOTAL KNEE BILATERAL Bilateral    SMALL TOE RIGHT FOOT REMOVED  04/2017   DUE TO INFECTION   Spleenectomy  2000   TOTAL HIP ARTHROPLASTY Right 02/28/2018   Procedure: RIGHT TOTAL HIP ARTHROPLASTY ANTERIOR APPROACH;  Surgeon: Ollen Gross, MD;  Location: [facility];  Service: Orthopedics;  Laterality: Right;    TOTAL HIP ARTHROPLASTY Left 01/22/2020   Procedure: TOTAL HIP ARTHROPLASTY ANTERIOR APPROACH;  Surgeon: Ollen Gross, MD;  Location: [facility];  Service: Orthopedics;  Laterality: Left;    TOTAL KNEE ARTHROPLASTY WITH REVISION COMPONENTS       FAMILY HISTORY:  Family History  Problem Relation Age of Onset   Cancer Mother    Heart disease Father  Hyperlipidemia Father    Aneurysm Sister    Kidney disease Brother      SOCIAL HISTORY:  reports that she quit smoking about 50 years ago. Her smoking use included cigarettes. She started smoking about 80 years ago. She has a 30 pack-year smoking history. She has been exposed to tobacco smoke. She has never used smokeless tobacco. She reports that she does not currently use alcohol. She reports that she does not use drugs.   ALLERGIES: Arthrotec [diclofenac-misoprostol], Doxycycline, Latex, Amlodipine, Celebrex [celecoxib], Vancomycin, Diclofenac, and Shellfish allergy   MEDICATIONS:  Current Outpatient Medications  Medication Sig Dispense Refill   apixaban (ELIQUIS) 2.5 MG TABS tablet Take 1 tablet (2.5 mg total) by mouth 2 (two) times daily. 180 tablet 3    Cholecalciferol (VITAMIN D3) 50 MCG (2000 UT) TABS Take 2,000 Units by mouth daily.     clotrimazole-betamethasone (LOTRISONE) cream Apply 1 Application topically daily as needed or as directed 45 g 5   colchicine 0.6 MG tablet Take 1 tablet (0.6 mg total) by mouth daily. 90 tablet 3   famotidine (PEPCID) 40 MG tablet Take 1 tablet (40 mg total) by mouth nightly as needed for Heartburn. 90 tablet 1   ferrous sulfate 325 (65 FE) MG tablet Take 325 mg by mouth 2 (two) times daily with a meal.     furosemide (LASIX) 40 MG tablet Take 1 tablet (40 mg total) by mouth 2 (two) times daily. 180 tablet 3   levothyroxine (SYNTHROID) 88 MCG tablet Take 1 tablet (88 mcg total) by mouth daily before breakfast. 90 tablet 3   metFORMIN (GLUCOPHAGE-XR) 500 MG 24 hr tablet Take 1 tablet (500 mg total) by mouth at bedtime. 90 tablet 0   metolazone (ZAROXOLYN) 2.5 MG tablet Take 1 tablet (2.5 mg total) by mouth once a week. monday's 12 tablet 3   metoprolol succinate (TOPROL-XL) 50 MG 24 hr tablet TAKE 1 TABLET IN THE MORNING AND ONE-HALF (1/2) TABLET IN THE EVENING DAILY AS DIRECTED 135 tablet 3   omeprazole (PRILOSEC) 40 MG capsule Take 1 capsule (40 mg total) by mouth in the morning. 90 capsule 1   potassium chloride SA (KLOR-CON M20) 20 MEQ tablet Take 1 tablet (20 mEq total) by mouth daily. 90 tablet 3   pramipexole (MIRAPEX) 1 MG tablet Take 1 tablet by mouth every night 90 tablet 1   pravastatin (PRAVACHOL) 20 MG tablet Take 1 tablet (20 mg total) by mouth daily. 90 tablet 3   sulfamethoxazole-trimethoprim (BACTRIM) 400-80 MG tablet Take 1/2 tablet by mouth, 3 times weekly. 18 tablet 1   triamcinolone ointment (KENALOG) 0.1 % Apply 1 application topically 2 (two) times daily as needed (skin irritation.).     vitamin C (ASCORBIC ACID) 500 MG tablet Take 500 mg by mouth 3 (three) times a week. Monday , Wednesday and Friday     No current facility-administered medications for this visit.     REVIEW OF  SYSTEMS: On review of systems, the patient reports that she is doing ***     PHYSICAL EXAM:  Wt Readings from Last 3 Encounters:  08/01/22 150 lb (68 kg)  06/27/22 144 lb (65.3 kg)  02/22/22 146 lb (66.2 kg)   Temp Readings from Last 3 Encounters:  12/09/20 97.6 F (36.4 C) (Oral)  11/30/20 97.8 F (36.6 C) (Oral)  04/27/20 98 F (36.7 C) (Oral)   BP Readings from Last 3 Encounters:  08/01/22 136/84  06/27/22 110/71  02/22/22 112/62   Pulse Readings  from Last 3 Encounters:  08/01/22 89  06/27/22 76  02/22/22 86    In general this is a well appearing *** female in no acute distress. She's alert and oriented x4 and appropriate throughout the examination. Cardiopulmonary assessment is negative for acute distress and she exhibits normal effort. Bilateral breast exam is deferred.    ECOG = ***  0 - Asymptomatic (Fully active, able to carry on all predisease activities without restriction)  1 - Symptomatic but completely ambulatory (Restricted in physically strenuous activity but ambulatory and able to carry out work of a light or sedentary nature. For example, light housework, office work)  2 - Symptomatic, <50% in bed during the day (Ambulatory and capable of all self care but unable to carry out any work activities. Up and about more than 50% of waking hours)  3 - Symptomatic, >50% in bed, but not bedbound (Capable of only limited self-care, confined to bed or chair 50% or more of waking hours)  4 - Bedbound (Completely disabled. Cannot carry on any self-care. Totally confined to bed or chair)  5 - Death   Santiago Glad MM, Creech RH, Tormey DC, et al. [phone]). "Toxicity and response criteria of the [facility]". Am. Evlyn Clines. Oncol. 5 (6): 649-55    LABORATORY DATA:  Lab Results  Component Value Date   WBC 5.9 06/27/2022   HGB 13.9 06/27/2022   HCT 40.7 06/27/2022   MCV 96.0 06/27/2022   PLT 222.0 06/27/2022   Lab Results  Component Value Date    NA 138 06/27/2022   K 3.5 06/27/2022   CL 98 06/27/2022   CO2 31 06/27/2022   Lab Results  Component Value Date   ALT 20 06/27/2022   AST 20 06/27/2022   ALKPHOS 69 06/27/2022   BILITOT 0.5 06/27/2022      RADIOGRAPHY: No results found.     IMPRESSION/PLAN: 1. Stage IA, [id number], grade 2, ER/PR positive invasive ductal carcinoma of the left breast. Dr. Mitzi Hansen discusses the pathology findings and reviews the nature of early stage breast disease. The consensus from the breast conference includes breast conservation with lumpectomy. Dr. Mitzi Hansen also discusses cases in which radiation may be optional for favorable cases based on final pathology.  We will follow-up with her final results of surgery, but if recommended Dr. Mitzi Hansen would anticipate a course of 4 weeks of radiotherapy  external radiotherapy to the breast  to reduce risks of local recurrence followed by antiestrogen therapy. We discussed the risks, benefits, short, and long term effects of radiotherapy, as well as the curative intent, and the patient is interested in proceeding. Dr. Mitzi Hansen discusses the delivery and logistics of radiotherapy and anticipates a course of *** weeks of radiotherapy. We will see her back a few weeks after surgery to discuss the simulation process and anticipate we starting radiotherapy about 4-6 weeks after surgery.   2. Possible genetic predisposition to malignancy. The patient is a candidate for genetic testing given *** personal and family history. She will meet with our geneticist today in clinic.   In a visit lasting *** minutes, greater than 50% of the time was spent face to face reviewing her case, as well as in preparation of, discussing, and coordinating the patient's care.  The above documentation reflects my direct findings during this shared patient visit. Please see the separate note by Dr. Mitzi Hansen on this date for the remainder of the patient's plan of care.    Osker Mason,  PAC    **  Disclaimer: This note was dictated with voice recognition software. Similar sounding words can inadvertently be transcribed and this note may contain transcription errors which may not have been corrected upon publication of note.**

## 2022-09-28 NOTE — Progress Notes (Signed)
New Breast Cancer Diagnosis: Left Breast   Did patient present with symptoms (if so, please note symptoms) or screening mammography?:Palpable mass     Location and Extent of disease :left breast. Located at 12 o'clock position, measured 2 cm in greatest dimension. Adenopathy no.  Histology per Pathology Report: grade 2, Invasive Ductal Carcinoma  Receptor Status: ER(positive), PR (positive), Her2-neu (negative), Ki-(20%)   Surgeon and surgical plan, if any:  Dr. Magnus Ivan 09/13/2022 -From a surgical standpoint we discussed breast conservation versus mastectomy. She is interested in breast conservation.  -We will need to get cardiac clearance for surgery and she will need to stop her blood thinning medication for 2 days preoperatively.  -We will also refer to medical and radiation oncology.    Medical oncologist, treatment if any:   Dr. Pamelia Hoit 10/03/2022   Family History of Breast/Ovarian/Prostate Cancer:   Lymphedema issues, if any:      Pain issues, if any:     SAFETY ISSUES: Prior radiation? No Pacemaker/ICD? No Possible current pregnancy? Hysterectomy Is the patient on methotrexate?   Current Complaints / other details:   -Right Breast treated with mastectomy more than 50 years ago.

## 2022-09-29 ENCOUNTER — Ambulatory Visit
Admission: RE | Admit: 2022-09-29 | Discharge: 2022-09-29 | Disposition: A | Discharge status: Home / Self Care | Payer: Medicare Other | Source: Ambulatory Visit | Attending: Radiation Oncology | Admitting: Radiation Oncology

## 2022-09-29 DIAGNOSIS — C50812 Malignant neoplasm of overlapping sites of left female breast: Secondary | ICD-10-CM

## 2022-10-03 ENCOUNTER — Inpatient Hospital Stay: Payer: Medicare Other | Attending: Hematology and Oncology | Admitting: Hematology and Oncology

## 2022-10-03 ENCOUNTER — Other Ambulatory Visit: Payer: Self-pay

## 2022-10-03 ENCOUNTER — Inpatient Hospital Stay: Payer: Medicare Other

## 2022-10-03 VITALS — BP 119/71 | HR 94 | Temp 98.8°F | Resp 17 | Wt 146.3 lb

## 2022-10-03 DIAGNOSIS — Z170 Estrogen receptor positive status [ER+]: Secondary | ICD-10-CM | POA: Diagnosis not present

## 2022-10-03 DIAGNOSIS — Z87891 Personal history of nicotine dependence: Secondary | ICD-10-CM | POA: Insufficient documentation

## 2022-10-03 DIAGNOSIS — C50812 Malignant neoplasm of overlapping sites of left female breast: Secondary | ICD-10-CM | POA: Insufficient documentation

## 2022-10-03 DIAGNOSIS — Z90710 Acquired absence of both cervix and uterus: Secondary | ICD-10-CM | POA: Insufficient documentation

## 2022-10-03 DIAGNOSIS — Z9011 Acquired absence of right breast and nipple: Secondary | ICD-10-CM | POA: Insufficient documentation

## 2022-10-03 NOTE — Progress Notes (Signed)
[facility] CONSULT NOTE  Patient Care Team: Clayborne Dana, NP as PCP - General (Family Medicine)  CHIEF COMPLAINTS/PURPOSE OF CONSULTATION:  Newly diagnosed breast cancer  HISTORY OF PRESENTING ILLNESS:  Dana Bradford 89 y.o. female is here because of recent diagnosis of left breast cancer.  Patient felt a lump in the left breast which led to mammogram and ultrasound that revealed a 2 cm mass and an indeterminate 0.5 cm mass.  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  She was seen by Dr. Magnus Ivan who recommended lumpectomy.  She had met with radiation oncology and is here today to discuss adjuvant treatment plan.  I reviewed her records extensively and collaborated the history with the patient.  SUMMARY OF ONCOLOGIC HISTORY: Oncology History  Malignant neoplasm of overlapping sites of left female breast (HCC)  08/26/2022 Initial Diagnosis   Painless palpable left breast lump (right mastectomy 50-60 years ago): 1.5 cm mass by ultrasound measured 2 cm, indeterminate 0.5 cm left breast mass (fibroadenoma); biopsy of the 2 cm mass: Grade 2 IDC ER 95%, PR 95%, Ki67 20%, HER2 0 by [facility]    09/26/2022 Cancer Staging   Staging form: Breast, AJCC 8th Edition - Clinical: Stage IA (cT1c, cN0, cM0, G2, ER+, PR+, HER2-) - Signed by Ronny Bacon, PA-C on 09/26/2022 Stage prefix: Initial diagnosis Method of lymph node assessment: Clinical Histologic grading system: 3 grade system      MEDICAL HISTORY:  Past Medical History:  Diagnosis Date   Acute kidney failure (HCC) 2016 to 2017 due to vacomycin   no kidney issues now   Anemia    Anxiety    Arthritis    Atrial fibrillation (HCC)    Depression    Diabetes mellitus without complication (HCC)    Dyspnea    Food allergy    Fish (Hives)    Glaucoma    Hypertension    Impaired fasting  glucose    Other specified acquired hypothyroidism    Pneumonia    Restless leg    Restless leg    Skin cancer    Forehead   Stroke (HCC) 05/2017   ocular stoke on plavix , right eye some vision loss   Venous stasis ulcer (HCC)     SURGICAL HISTORY: Past Surgical History:  Procedure Laterality Date   ABDOMINAL HYSTERECTOMY  1993   Uterine Prolapse partial   BREAST BIOPSY Left 08/26/2022   MM LT BREAST BX W LOC DEV 1ST LESION IMAGE BX SPEC STEREO GUIDE 08/26/2022 [facility]   BREAST BIOPSY Left 08/26/2022   [place] LT BREAST BX W LOC DEV 1ST LESION IMG BX SPEC US GUIDE 08/26/2022 [facility]   MASTECTOMY  1974   right breast not sure if cancer 1960's   REPLACEMENT TOTAL KNEE BILATERAL Bilateral    SMALL TOE RIGHT FOOT REMOVED  04/2017   DUE TO INFECTION   Spleenectomy  2000   TOTAL HIP ARTHROPLASTY Right 02/28/2018   Procedure: RIGHT TOTAL HIP ARTHROPLASTY ANTERIOR APPROACH;  Surgeon: Ollen Gross, MD;  Location: [facility];  Service: Orthopedics;  Laterality: Right;    TOTAL HIP ARTHROPLASTY Left 01/22/2020   Procedure: TOTAL HIP ARTHROPLASTY ANTERIOR APPROACH;  Surgeon: Ollen Gross, MD;  Location: [facility];  Service: Orthopedics;  Laterality: Left;    TOTAL KNEE ARTHROPLASTY WITH REVISION COMPONENTS  SOCIAL HISTORY: Social History   Socioeconomic History   Marital status: Widowed    Spouse name: Not on file   Number of children: 2   Years of education: 88   Highest education level: Not on file  Occupational History   Occupation: Retired   Tobacco Use   Smoking status: Former    Current packs/day: 0.00    Average packs/day: 1 pack/day for 30.0 years (30.0 ttl pk-yrs)    Types: Cigarettes    Start date: 78    Quit date: 68    Years since quitting: 50.6    Passive exposure: Past   Smokeless tobacco: Never   Tobacco comments:    qiot 50 yrs ago  Vaping Use   Vaping status: Never Used  Substance and Sexual Activity   Alcohol use: Not  Currently   Drug use: No   Sexual activity: Not Currently    Birth control/protection: Surgical    Comment: Hysterectomy  Other Topics Concern   Not on file  Social History Narrative   Marital Status: Widowed    Children:  Daughter Skipper Cliche) Son Eddye Wiesenfeld)   Pets: None    Living Situation: Lives alone    Occupation: Retired    Education: Engineer, agricultural    Tobacco:  She quit smoking 35 years ago after having smoked 1 ppd for 20 years.      Alcohol Use:  One glass of wine twice a week     Drug Use:  None   Diet:  Regular   Exercise:  None   Hobbies: Crafting, Gardening.             Social Determinants of Health   Financial Resource Strain: Low Risk  (07/20/2022)   Received from [facility], [facility]   Overall Financial Resource Strain (CARDIA)    Difficulty of Paying Living Expenses: Not hard at all  Food Insecurity: No Food Insecurity (07/20/2022)   Received from [facility], [facility]   Hunger Vital Sign    Worried About Running Out of Food in the Last Year: Never true    Ran Out of Food in the Last Year: Never true  Transportation Needs: No Transportation Needs (07/20/2022)   Received from [facility], [facility]   PRAPARE - Transportation    Lack of Transportation (Medical): No    Lack of Transportation (Non-Medical): No  Physical Activity: Unknown (09/14/2021)   Received from [facility] visits prior to 04/23/2022.   Exercise Vital Sign    Days of Exercise per Week: Patient refused    Minutes of Exercise per Session: Not on file  Stress: Patient Declined (09/14/2021)   [facility] prior to 04/23/2022., [facility] visits prior to 04/23/2022.   [employer] of Occupational Health - Occupational Stress Questionnaire    Feeling of Stress : Patient declined  Social Connections: Patient Declined (09/14/2021)   [facility]   Social Connection and Isolation Panel [NHANES]    Frequency of Communication with Friends and Family: Patient declined    Frequency of Social Gatherings with Friends and Family: Patient declined    Attends Religious Services: Patient declined    Active Member of Clubs or Organizations: Patient declined    Attends Banker Meetings: Patient declined    Marital Status: Patient declined  Intimate Partner Violence: Unknown (06/23/2021)   [facility] prior to 04/23/2022., [facility] visits prior to 04/23/2022.   Humiliation, Afraid, Rape, and Kick questionnaire    Fear of Current or Ex-Partner: Patient refused    Emotionally Abused: Patient refused    Physically Abused: Patient refused    Sexually Abused: Patient refused    FAMILY HISTORY: Family History  Problem Relation Age of Onset   Cancer Mother    Heart disease Father    Hyperlipidemia Father    Aneurysm Sister    Kidney disease Brother     ALLERGIES:  is allergic to arthrotec [diclofenac-misoprostol], doxycycline, latex, amlodipine, celebrex [celecoxib], vancomycin, diclofenac, and shellfish allergy.  MEDICATIONS:  Current Outpatient Medications  Medication Sig Dispense Refill   apixaban (ELIQUIS) 2.5 MG TABS tablet Take 1 tablet (2.5 mg total) by mouth 2 (two) times daily. 180 tablet 3   Cholecalciferol (VITAMIN D3) 50 MCG (2000 UT) TABS Take 2,000 Units by mouth daily.     clotrimazole-betamethasone (LOTRISONE) cream Apply 1 Application topically daily as needed or as directed 45 g 5   colchicine 0.6 MG tablet Take 1 tablet (0.6 mg total) by mouth daily. 90 tablet 3   famotidine (PEPCID) 40 MG tablet Take 1 tablet (40 mg total) by mouth nightly as needed for Heartburn. 90 tablet 1   ferrous sulfate 325 (65 FE) MG tablet Take 325 mg by mouth 2 (two) times daily with a meal.     furosemide (LASIX) 40 MG tablet Take 1 tablet  (40 mg total) by mouth 2 (two) times daily. 180 tablet 3   levothyroxine (SYNTHROID) 88 MCG tablet Take 1 tablet (88 mcg total) by mouth daily before breakfast. 90 tablet 3   metFORMIN (GLUCOPHAGE-XR) 500 MG 24 hr tablet TAKE 1 TABLET AT BEDTIME 90 tablet 3   metolazone (ZAROXOLYN) 2.5 MG tablet Take 1 tablet (2.5 mg total) by mouth once a week. monday's 12 tablet 3   metoprolol succinate (TOPROL-XL) 50 MG 24 hr tablet TAKE 1 TABLET IN THE MORNING AND ONE-HALF (1/2) TABLET IN THE EVENING DAILY AS DIRECTED 135 tablet 3   omeprazole (PRILOSEC) 40 MG capsule Take 1 capsule (40 mg total) by mouth in the morning. 90 capsule 1   potassium chloride SA (KLOR-CON M20) 20 MEQ tablet Take 1 tablet (20 mEq total) by mouth daily. 90 tablet 3   pramipexole (MIRAPEX) 1 MG tablet Take 1 tablet by mouth every night 90 tablet 1   pravastatin (PRAVACHOL) 20 MG tablet Take 1 tablet (20 mg total) by mouth daily. 90 tablet 3   sulfamethoxazole-trimethoprim (BACTRIM) 400-80 MG tablet Take 1/2 tablet by mouth, 3 times weekly. 18 tablet 1   triamcinolone ointment (KENALOG) 0.1 % Apply 1 application topically 2 (two) times daily as needed (skin irritation.).     vitamin C (ASCORBIC ACID) 500 MG tablet Take 500 mg by mouth 3 (three) times a week. Monday , Wednesday and Friday     No current facility-administered medications for this visit.    REVIEW OF SYSTEMS:   Constitutional: Denies fevers, chills or abnormal night sweats Breast: Palpable lump in the left breast All other systems were reviewed with the patient and are negative.  PHYSICAL EXAMINATION: ECOG PERFORMANCE STATUS: 1 - Symptomatic but completely ambulatory  Vitals:   10/03/22 1303  BP: 119/71  Pulse: 94  Resp: 17  Temp: 98.8 F (37.1 C)  SpO2: 94%   Filed Weights   10/03/22 1303  Weight: 146 lb 4.8 oz (66.4  kg)    GENERAL:alert, no distress and comfortable    LABORATORY DATA:  I have reviewed the data as listed Lab Results  Component  Value Date   WBC 5.9 06/27/2022   HGB 13.9 06/27/2022   HCT 40.7 06/27/2022   MCV 96.0 06/27/2022   PLT 222.0 06/27/2022   Lab Results  Component Value Date   NA 138 06/27/2022   K 3.5 06/27/2022   CL 98 06/27/2022   CO2 31 06/27/2022    RADIOGRAPHIC STUDIES: I have personally reviewed the radiological reports and agreed with the findings in the report.  ASSESSMENT AND PLAN:  Malignant neoplasm of overlapping sites of left female breast (HCC) 08/26/2022: Painless palpable left breast lump (right mastectomy 50-60 years ago): 1.5 cm mass by ultrasound measured 2 cm, indeterminate 0.5 cm left breast mass (fibroadenoma); biopsy of the 2 cm mass: Grade 2 IDC ER 95%, PR 95%, Ki67 20%, HER2 0 by [facility]  Pathology and radiology counseling: Discussed with the patient, the details of pathology including the type of breast cancer,the clinical staging, the significance of ER, PR and HER-2/neu receptors and the implications for treatment. After reviewing the pathology in detail, we proceeded to discuss the different treatment options    Treatment plan: Breast conserving surgery Followed by adjuvant antiestrogen therapy (optional)  Anastrozole counseling: We discussed the risks and benefits of anti-estrogen therapy with aromatase inhibitors. These include but not limited to insomnia, hot flashes, mood changes, vaginal dryness, bone density loss, and weight gain. We strongly believe that the benefits far outweigh the risks. Patient understands these risks and consented to starting treatment. Planned treatment duration is 5 years.  I sent a message to cardiology to see if her appointment can be expedited.  Patient is anxious to get her surgery done. Return to clinic after surgery     All questions were answered. The patient knows to call the clinic with any problems, questions or concerns.    Tamsen Meek, MD 10/03/22

## 2022-10-03 NOTE — Assessment & Plan Note (Signed)
08/26/2022: Painless palpable left breast lump (right mastectomy 50-60 years ago): 1.5 cm mass by ultrasound measured 2 cm, indeterminate 0.5 cm left breast mass (fibroadenoma); biopsy of the 2 cm mass: Grade 2 IDC ER 95%, PR 95%, Ki67 20%, HER2 0 by [facility]  Pathology and radiology counseling: Discussed with the patient, the details of pathology including the type of breast cancer,the clinical staging, the significance of ER, PR and HER-2/neu receptors and the implications for treatment. After reviewing the pathology in detail, we proceeded to discuss the different treatment options    Treatment plan: Breast conserving surgery Followed by adjuvant antiestrogen therapy (optional)  Anastrozole counseling: We discussed the risks and benefits of anti-estrogen therapy with aromatase inhibitors. These include but not limited to insomnia, hot flashes, mood changes, vaginal dryness, bone density loss, and weight gain. We strongly believe that the benefits far outweigh the risks. Patient understands these risks and consented to starting treatment. Planned treatment duration is 5 years.  Return to clinic after surgery

## 2022-10-04 ENCOUNTER — Encounter: Payer: Self-pay | Admitting: *Deleted

## 2022-10-10 ENCOUNTER — Ambulatory Visit: Payer: Medicare Other | Admitting: Cardiology

## 2022-10-12 NOTE — Progress Notes (Unsigned)
Cardiology Office Note:    Date:  10/13/2022   ID:  Bradford Bradford, DOB 1930/07/24, MRN [id number]  PCP:  Bradford Dana, NP  Cardiologist:  Norman Herrlich, MD    Referring MD: Bradford Dana, NP    ASSESSMENT:    1. Hypertensive heart and kidney disease with chronic diastolic congestive heart failure and stage 3 chronic kidney disease, unspecified whether stage 3a or 3b CKD (HCC)   2. Permanent atrial fibrillation (HCC)   3. Chronic anticoagulation   4. Mixed hyperlipidemia    PLAN:    In order of problems listed above:  Cardiac perspective she is optimized for planned surgical procedure with instructions to pause her anticoagulant to full calendars days prior to surgery in general resume 48 hours afterwards but up to the discretion of the surgeon when to restart Heart failure is compensated she is optimized for surgical procedure Atrial fibrillation is rate controlled with her beta-blocker please continue perioperatively Please use a monitored bed postoperatively for observation check EKG postoperative day 1 and issues contact heart care Continue her statin lipids are at target   Next appointment: 6 months   Medication Adjustments/Labs and Tests Ordered: Current medicines are reviewed at length with the patient today.  Concerns regarding medicines are outlined above.  Orders Placed This Encounter  Procedures   EKG 12-Lead   No orders of the defined types were placed in this encounter.    History of Present Illness:    Bradford Bradford is a 89 y.o. female with a hx of hypertensive heart disease and stage III CKD complicated by diastolic heart failure persistent atrial fibrillation with chronic anticoagulation hyperlipidemia and gout last seen by me 11/01/2021.  She had an echocardiogram in 03/28/2022 showing normal left circular size wall thickness EF 55 to 60% and indeterminate diastolic function due to atrial fibrillation right ventricle is normal in size and function left  atrium was severely dilated she had mild mitral mild to moderate tricuspid regurgitation her proBNP level 7 months ago was low marked improvement from previous severe elevation with decompensated heart failure  Compliance with diet, lifestyle and medications: Yes  She is pending a lumpectomy for breast cancer Cardiac perspective really has done well she is not short of breath she has intermittent edema and takes both her loop diuretic as well as metolazone once a week No chest pain palpitation or syncope And directed to admit her anticoagulant to fill calendar days prior to surgery and generally off for 48 hours afterwards but perhaps longer depending on postoperative bleeding Please see here that should be placed in observation overnight I think it is appropriate with her heart disease EKG shows rate controlled atrial fibrillation 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 , right eye some vision loss   Venous stasis ulcer (HCC)  Current Medications: Current Meds  Medication Sig   apixaban (ELIQUIS) 2.5 MG TABS tablet Take 1 tablet (2.5 mg total) by mouth 2 (two) times daily.   Cholecalciferol (VITAMIN D3) 50 MCG (2000 UT) TABS Take 2,000 Units by mouth daily.   clotrimazole-betamethasone (LOTRISONE) cream Apply 1 Application topically daily as needed or as directed (Patient taking differently: Apply 1 Application topically daily as needed (rash).)   colchicine 0.6 MG tablet Take 1 tablet (0.6 mg total) by mouth  daily.   famotidine (PEPCID) 40 MG tablet Take 1 tablet (40 mg total) by mouth nightly as needed for Heartburn. (Patient taking differently: Take 40 mg by mouth at bedtime as needed for heartburn or indigestion.)   ferrous sulfate 325 (65 FE) MG tablet Take 325 mg by mouth 2 (two) times daily with a meal.   furosemide (LASIX) 40 MG tablet Take 1 tablet (40 mg total) by mouth 2 (two) times daily.   levothyroxine (SYNTHROID) 88 MCG tablet Take 1 tablet (88 mcg total) by mouth daily before breakfast.   metFORMIN (GLUCOPHAGE-XR) 500 MG 24 hr tablet TAKE 1 TABLET AT BEDTIME   metolazone (ZAROXOLYN) 2.5 MG tablet Take 1 tablet (2.5 mg total) by mouth once a week. monday's   metoprolol succinate (TOPROL-XL) 50 MG 24 hr tablet TAKE 1 TABLET IN THE MORNING AND ONE-HALF (1/2) TABLET IN THE EVENING DAILY AS DIRECTED   omeprazole (PRILOSEC) 40 MG capsule Take 1 capsule (40 mg total) by mouth in the morning.   potassium chloride SA (KLOR-CON M20) 20 MEQ tablet Take 1 tablet (20 mEq total) by mouth daily.   pramipexole (MIRAPEX) 1 MG tablet Take 1 tablet by mouth every night   pravastatin (PRAVACHOL) 20 MG tablet Take 1 tablet (20 mg total) by mouth daily.   sulfamethoxazole-trimethoprim (BACTRIM) 400-80 MG tablet Take 1/2 tablet by mouth, 3 times weekly.   triamcinolone ointment (KENALOG) 0.1 % Apply 1 application topically 2 (two) times daily as needed (skin irritation.).   vitamin C (ASCORBIC ACID) 500 MG tablet Take 500 mg by mouth 3 (three) times a week. Monday , Wednesday and Friday      EKGs/Labs/Other Studies Reviewed:    The following studies were reviewed today:  Cardiac Studies & Procedures       ECHOCARDIOGRAM  ECHOCARDIOGRAM COMPLETE 03/28/2022  Narrative ECHOCARDIOGRAM REPORT    Patient Name:   Bradford Bradford Date of Exam: 03/28/2022 Medical Rec #:  [id number]     Height:       58.0 in Accession #:    [id number]    Weight:       146.0 lb Date of Birth:  1930-11-28     BSA:           1.593 m Patient Age:    89 years      BP:           112/62 mmHg Patient Gender: F             HR:           91 bpm. Exam Location:  [place]  Procedure: 2D Echo, Color Doppler and Cardiac Doppler  Indications:    R06.02 SOB; R60.0 Lower extremity edema  History:        Patient has prior history of Echocardiogram examinations, most recent 04/23/2020. CHF, Stroke, Signs/Symptoms:Shortness of Breath and Edema; Risk Factors:Hypertension, Diabetes, CKD stage 3b, Former Smoker and Dyslipidemia. Patient denies chest pain. She does have SOB with bilateral leg edema. Patient is on  Eliquis.  Sonographer:    Carlos American RVT, RDCS (AE), RDMS Referring Phys: [phone] Georgeanna Lea   Sonographer Comments: Technically difficult study due to poor echo windows. Image acquisition challenging due to patient body habitus and severe kyphosis. IMPRESSIONS   1. Left ventricular ejection fraction, by estimation, is 55 to 60%. The left ventricle has normal function. The left ventricle has no regional wall motion abnormalities. Left ventricular diastolic parameters are indeterminate. 2. Right ventricular systolic function is normal. The right ventricular size is normal. 3. Left atrial size was severely dilated. 4. The mitral valve is normal in structure. Mild mitral valve regurgitation. No evidence of mitral stenosis. 5. Tricuspid valve regurgitation is mild to moderate. 6. The aortic valve is normal in structure. Aortic valve regurgitation is not visualized. No aortic stenosis is present. 7. The inferior vena cava is normal in size with greater than 50% respiratory variability, suggesting right atrial pressure of 3 mmHg.  Comparison(s): EF 55%, mild LVH, moderate MAC.  FINDINGS Left Ventricle: Left ventricular ejection fraction, by estimation, is 55 to 60%. The left ventricle has normal function. The left ventricle has no regional wall motion abnormalities. The left ventricular internal cavity size  was normal in size. There is no left ventricular hypertrophy. Left ventricular diastolic parameters are indeterminate.  Right Ventricle: The right ventricular size is normal. No increase in right ventricular wall thickness. Right ventricular systolic function is normal.  Left Atrium: Left atrial size was severely dilated.  Right Atrium: Right atrial size was normal in size.  Pericardium: There is no evidence of pericardial effusion.  Mitral Valve: The mitral valve is normal in structure. Mild mitral valve regurgitation. No evidence of mitral valve stenosis.  Tricuspid Valve: The tricuspid valve is normal in structure. Tricuspid valve regurgitation is mild to moderate. No evidence of tricuspid stenosis.  Aortic Valve: The aortic valve is normal in structure. Aortic valve regurgitation is not visualized. No aortic stenosis is present. Aortic valve mean gradient measures 2.0 mmHg. Aortic valve peak gradient measures 4.7 mmHg. Aortic valve area, by VTI measures 1.86 cm.  Pulmonic Valve: The pulmonic valve was normal in structure. Pulmonic valve regurgitation is mild. No evidence of pulmonic stenosis.  Aorta: The aortic root is normal in size and structure.  Venous: The inferior vena cava is normal in size with greater than 50% respiratory variability, suggesting right atrial pressure of 3 mmHg.  IAS/Shunts: No atrial level shunt detected by color flow Doppler.   LEFT VENTRICLE PLAX 2D LVIDd:         3.30 cm   Diastology LVIDs:         2.30 cm   LV e' medial:    5.19 cm/s LV PW:         0.90 cm   LV E/e' medial:  23.1 LV IVS:        1.00 cm   LV e' lateral:   7.87 cm/s LVOT diam:     1.70 cm   LV E/e' lateral: 15.2 LV SV:         39 LV SV Index:   24 LVOT Area:     2.27 cm   RIGHT VENTRICLE RV S prime:     7.05 cm/s TAPSE (M-mode): 1.6 cm  LEFT ATRIUM              Index        RIGHT ATRIUM           Index LA diam:  4.10 cm  2.57 cm/m   RA Area:     18.60 cm LA Vol  (A2C):   93.0 ml  58.38 ml/m  RA Volume:   53.40 ml  33.52 ml/m LA Vol (A4C):   101.0 ml 63.40 ml/m LA Biplane Vol: 96.5 ml  60.57 ml/m AORTIC VALVE                    PULMONIC VALVE AV Area (Vmax):    1.95 cm     PV Vmax:          1.04 m/s AV Area (Vmean):   2.01 cm     PV Peak grad:     4.4 mmHg AV Area (VTI):     1.86 cm     PR End Diast Vel: 3.66 msec AV Vmax:           108.00 cm/s AV Vmean:          71.600 cm/s AV VTI:            0.209 m AV Peak Grad:      4.7 mmHg AV Mean Grad:      2.0 mmHg LVOT Vmax:         92.80 cm/s LVOT Vmean:        63.400 cm/s LVOT VTI:          0.171 m LVOT/AV VTI ratio: 0.82  AORTA Ao Root diam: 3.10 cm Ao Asc diam:  3.30 cm  MITRAL VALVE                TRICUSPID VALVE MV Area (PHT): 7.29 cm     TR Peak grad:   30.5 mmHg MV Decel Time: 104 msec     TR Vmax:        276.00 cm/s MV E velocity: 120.00 cm/s SHUNTS Systemic VTI:  0.17 m Systemic Diam: 1.70 cm  Gypsy Balsam MD Electronically signed by Gypsy Balsam MD Signature Date/Time: 03/29/2022/12:27:17 PM    Final             EKG Interpretation Date/Time:  Thursday October 13 2022 15:34:38 EDT Ventricular Rate:  82 PR Interval:    QRS Duration:  78 QT Interval:  408 QTC Calculation: 476 R Axis:   76  Text Interpretation: Atrial fibrillation CONTROLLED VENTRICULAR RESPONSE Nonspecific T wave abnormality When compared with ECG of 22-Apr-2020 17:56, No significant change was found Confirmed by Norman Herrlich ([id number]) on 10/13/2022 3:36:16 PM   Recent Labs: 06/27/2022: ALT 20; BUN 30; Creatinine, Ser 1.21; Hemoglobin 13.9; NT-Pro BNP 2,696; Platelets 222.0; Potassium 3.5; Sodium 138; TSH 0.48  Recent Lipid Panel    Component Value Date/Time   CHOL 148 06/27/2022 1446   TRIG 116.0 06/27/2022 1446   HDL 53.10 06/27/2022 1446   CHOLHDL 3 06/27/2022 1446   VLDL 23.2 06/27/2022 1446   LDLCALC 71 06/27/2022 1446   LDLDIRECT CANCELED 02/22/2022 1448    Physical Exam:    VS:   BP 120/80 (BP Location: Right Arm, Patient Position: Sitting, Cuff Size: Normal)   Pulse 82   Ht 4\' 10"  (1.473 m)   Wt 145 lb (65.8 kg)   SpO2 96%   BMI 30.31 kg/m     Wt Readings from Last 3 Encounters:  10/13/22 145 lb (65.8 kg)  10/03/22 146 lb 4.8 oz (66.4 kg)  08/01/22 150 lb (68 kg)     GEN:  Well nourished, well developed in no acute distress HEENT: Normal NECK: No JVD;  No carotid bruits LYMPHATICS: No lymphadenopathy CARDIAC: Irregular rate and rhythm  RESPIRATORY:  Clear to auscultation without rales, wheezing or rhonchi  ABDOMEN: Soft, non-tender, non-distended MUSCULOSKELETAL: 1+ bilateral lower extremity pitting edema; No deformity  SKIN: Warm and dry NEUROLOGIC:  Alert and oriented x 3 PSYCHIATRIC:  Normal affect    Signed, Norman Herrlich, MD  10/13/2022 4:00 PM    [facility]

## 2022-10-13 ENCOUNTER — Ambulatory Visit: Payer: Medicare Other | Attending: Cardiology | Admitting: Cardiology

## 2022-10-13 ENCOUNTER — Encounter: Payer: Self-pay | Admitting: Cardiology

## 2022-10-13 VITALS — BP 120/80 | HR 82 | Ht <= 58 in | Wt 145.0 lb

## 2022-10-13 DIAGNOSIS — Z7901 Long term (current) use of anticoagulants: Secondary | ICD-10-CM | POA: Diagnosis present

## 2022-10-13 DIAGNOSIS — E782 Mixed hyperlipidemia: Secondary | ICD-10-CM | POA: Diagnosis present

## 2022-10-13 DIAGNOSIS — I4821 Permanent atrial fibrillation: Secondary | ICD-10-CM | POA: Insufficient documentation

## 2022-10-13 DIAGNOSIS — I5032 Chronic diastolic (congestive) heart failure: Secondary | ICD-10-CM | POA: Insufficient documentation

## 2022-10-13 DIAGNOSIS — I130 Hypertensive heart and chronic kidney disease with heart failure and stage 1 through stage 4 chronic kidney disease, or unspecified chronic kidney disease: Secondary | ICD-10-CM | POA: Insufficient documentation

## 2022-10-13 DIAGNOSIS — N183 Chronic kidney disease, stage 3 unspecified: Secondary | ICD-10-CM | POA: Diagnosis present

## 2022-10-13 NOTE — Patient Instructions (Addendum)
Medication Instructions:  Your physician recommends that you continue on your current medications as directed. Please refer to the Current Medication list given to you today.  *If you need a refill on your cardiac medications before your next appointment, please call your pharmacy*   Lab Work: None If you have labs (blood work) drawn today and your tests are completely normal, you will receive your results only by: MyChart Message (if you have MyChart) OR A paper copy in the mail If you have any lab test that is abnormal or we need to change your treatment, we will call you to review the results.   Testing/Procedures: None   Follow-Up: At [facility], you and your health needs are our priority.  As part of our continuing mission to provide you with exceptional heart care, we have created designated Provider Care Teams.  These Care Teams include your primary Cardiologist (physician) and Advanced Practice Providers (APPs -  Physician Assistants and Nurse Practitioners) who all work together to provide you with the care you need, when you need it.  We recommend signing up for the patient portal called "MyChart".  Sign up information is provided on this After Visit Summary.  MyChart is used to connect with patients for Virtual Visits (Telemedicine).  Patients are able to view lab/test results, encounter notes, upcoming appointments, etc.  Non-urgent messages can be sent to your provider as well.   To learn more about what you can do with MyChart, go to [email or web address].    Your next appointment:   3 month(s)  Provider:   Norman Herrlich, MD    Other Instructions Prior to surgery hold Eliquis 2 full days

## 2022-10-19 ENCOUNTER — Other Ambulatory Visit: Payer: Self-pay | Admitting: Surgery

## 2022-10-19 DIAGNOSIS — Z853 Personal history of malignant neoplasm of breast: Secondary | ICD-10-CM

## 2022-10-20 ENCOUNTER — Encounter: Payer: Self-pay | Admitting: *Deleted

## 2022-10-20 ENCOUNTER — Other Ambulatory Visit: Payer: Self-pay

## 2022-10-20 ENCOUNTER — Encounter (HOSPITAL_BASED_OUTPATIENT_CLINIC_OR_DEPARTMENT_OTHER): Payer: Self-pay | Admitting: Surgery

## 2022-10-21 ENCOUNTER — Encounter (HOSPITAL_BASED_OUTPATIENT_CLINIC_OR_DEPARTMENT_OTHER)
Admission: RE | Admit: 2022-10-21 | Discharge: 2022-10-21 | Disposition: A | Discharge status: Home / Self Care | Payer: Medicare Other | Source: Ambulatory Visit | Attending: Surgery | Admitting: Surgery

## 2022-10-21 DIAGNOSIS — Z01818 Encounter for other preprocedural examination: Secondary | ICD-10-CM | POA: Insufficient documentation

## 2022-10-21 LAB — BASIC METABOLIC PANEL
Anion gap: 17 — ABNORMAL HIGH (ref 5–15)
BUN: 27 mg/dL — ABNORMAL HIGH (ref 8–23)
CO2: 27 mmol/L (ref 22–32)
Calcium: 9.1 mg/dL (ref 8.9–10.3)
Chloride: 96 mmol/L — ABNORMAL LOW (ref 98–111)
Creatinine, Ser: 1.07 mg/dL — ABNORMAL HIGH (ref 0.44–1.00)
GFR, Estimated: 49 mL/min — ABNORMAL LOW (ref >60)
Glucose, Bld: 100 mg/dL — ABNORMAL HIGH (ref 70–99)
Potassium: 3.9 mmol/L (ref 3.5–5.1)
Sodium: 140 mmol/L (ref 135–145)

## 2022-10-21 MED ORDER — CHLORHEXIDINE GLUCONATE CLOTH 2 % EX PADS: 6.0000 | MEDICATED_PAD | Freq: Once | CUTANEOUS | Status: DC

## 2022-10-21 NOTE — Progress Notes (Signed)

## 2022-10-25 ENCOUNTER — Ambulatory Visit
Admission: RE | Admit: 2022-10-25 | Discharge: 2022-10-25 | Disposition: A | Discharge status: Home / Self Care | Payer: Medicare Other | Source: Ambulatory Visit | Attending: Surgery | Admitting: Surgery

## 2022-10-25 ENCOUNTER — Telehealth: Payer: Self-pay

## 2022-10-25 DIAGNOSIS — Z853 Personal history of malignant neoplasm of breast: Secondary | ICD-10-CM

## 2022-10-25 HISTORY — PX: BREAST BIOPSY: SHX20

## 2022-10-25 NOTE — Telephone Encounter (Signed)
Following application status on message 08/09/2022.  I have not received any of the needed documents to process the patient application nor have I heard from Bea Graff with a status of our last conversation documented on 08/09/2022.   Application closed

## 2022-10-25 NOTE — Anesthesia Preprocedure Evaluation (Signed)
Anesthesia Evaluation  Patient identified by MRN, date of birth, ID band Patient awake    Reviewed: Allergy & Precautions, NPO status , Patient's Chart, lab work & pertinent test results, reviewed documented beta blocker date and time   History of Anesthesia Complications Negative for: history of anesthetic complications  Airway Mallampati: II  TM Distance: >3 FB Neck ROM: Full    Dental  (+) Edentulous Upper   Pulmonary former smoker   Pulmonary exam normal        Cardiovascular hypertension, Pt. on medications and Pt. on home beta blockers +CHF  Normal cardiovascular exam+ dysrhythmias (on Eliquis) Atrial Fibrillation      Neuro/Psych   Anxiety Depression    RLS CVA (2019)    GI/Hepatic Neg liver ROS,GERD  Medicated,,  Endo/Other  diabetes, Type 2, Oral Hypoglycemic AgentsHypothyroidism    Renal/GU Renal InsufficiencyRenal disease     Musculoskeletal  (+) Arthritis ,    Abdominal   Peds  Hematology   Anesthesia Other Findings glaucoma  Reproductive/Obstetrics                              Anesthesia Physical Anesthesia Plan  ASA: 2  Anesthesia Plan: General   Post-op Pain Management: Tylenol PO (pre-op)*   Induction: Intravenous  PONV Risk Score and Plan: 3 and Treatment may vary due to age or medical condition, Ondansetron and Propofol infusion  Airway Management Planned: LMA  Additional Equipment: None  Intra-op Plan:   Post-operative Plan: Extubation in OR  Informed Consent: I have reviewed the patients History and Physical, chart, labs and discussed the procedure including the risks, benefits and alternatives for the proposed anesthesia with the patient or authorized representative who has indicated his/her understanding and acceptance.     Dental advisory given  Plan Discussed with: CRNA  Anesthesia Plan Comments:         Anesthesia Quick Evaluation

## 2022-10-26 NOTE — H&P (Signed)
REFERRING PHYSICIAN: Hyman Hopes, NP PROVIDER: Wayne Both, MD MRN: [id number] DOB: 1930-12-15  Subjective   Chief Complaint: New Consultation and Breast Cancer  History of Present Illness: Dana Bradford is a 89 y.o. female who is seen today as an office consultation for evaluation of New Consultation and Breast Cancer  This is an 89 year old female who reports feeling a mass in her left breast approximately 2 months ago. She underwent mammograms and ultrasound showing a 2 cm mass at the 12 o'clock position of the left breast 7 cm from the nipple. There is a separate smaller mass is well in the breast. Both masses were biopsied. The smaller mass was a fibroadenoma. The larger mesh was an invasive ductal carcinoma. It was 95% ER/PR positive, HER2 negative, and had a Ki-67 of 20%. Ultrasound the axilla was negative. She currently lives alone but her daughter lives across the street. She has a history of congestive heart failure and is on Eliquis for A-fib but follows with the cardiology regularly. She has no chest pain or shortness of breath. She denies nipple discharge. There is no family history of breast cancer.  Review of Systems: A complete review of systems was obtained from the patient. I have reviewed this information and discussed as appropriate with the patient. See HPI as well for other ROS.  ROS   Medical History: Past Medical History:  Diagnosis Date  Anemia  Arthritis  CHF (congestive heart failure) (CMS/HHS-HCC)  GERD (gastroesophageal reflux disease)  Glaucoma (increased eye pressure)  History of cancer  History of stroke  Hypertension  Thyroid disease   There is no problem list on file for this patient.  Past Surgical History:  Procedure Laterality Date  JOINT REPLACEMENT  MASTECTOMY  SPLENECTOMY    Allergies  Allergen Reactions  Amlodipine Angioedema, Other (See Comments) and Swelling  Angio edema  angioedema  Other reaction(s): Angioedema  (ALLERGY/intolerance)  Celecoxib Diarrhea and Other (See Comments)  "celebrex" dark stools  GI Bleeding GI Bleeding  GI Bleeding  Vancomycin Other (See Comments)  Acute kidney failure   Other reaction(s): Other (See Comments) Acute Renal Failure   Acute kidney failure  Acute Renal Failure   Other reaction(s): Other (See Comments) Acute Renal Failure  Doxycycline Other (See Comments), Nausea And Vomiting and Rash  Significant enough to d/c treatment and refuse to take more  Significant enough to d/c treatment and refuse to take more Vomiting (intolerance) The first time she took it she had N/V and the second she had a rash.   Other reaction(s): Vomiting (intolerance) The first time she took it she had N/V and the second she had a rash.   Current Outpatient Medications on File Prior to Visit  Medication Sig Dispense Refill  apixaban (ELIQUIS) 2.5 mg tablet Take 2.5 mg by mouth 2 (two) times daily  ascorbic acid, vitamin C, (VITAMIN C) 500 MG tablet Take 500 mg by mouth once daily  cefadroxil (DURICEF) 500 mg capsule  cholecalciferol (VITAMIN D3) 2,000 unit tablet Take 2,000 Units by mouth once daily  colchicine 0.6 mg Cap Take by mouth  famotidine (PEPCID) 40 MG tablet Take 40 mg by mouth at bedtime as needed  FUROsemide (LASIX) 40 MG tablet Take 1 tablet by mouth 2 (two) times daily  levothyroxine (SYNTHROID) 88 MCG tablet Take 88 mcg by mouth every morning before breakfast (0630)  metFORMIN (GLUCOPHAGE-XR) 500 MG XR tablet Take 500 mg by mouth 2 (two) times daily with meals  metOLazone (ZAROXOLYN) 2.5 MG tablet  Take by mouth every 7 (seven) days  metoprolol succinate (TOPROL-XL) 50 MG XL tablet Take 50 mg by mouth once daily  omeprazole (PRILOSEC) 40 MG DR capsule Take 40 mg by mouth once daily  pramipexole (MIRAPEX) 1 MG tablet Take by mouth  pravastatin (PRAVACHOL) 20 MG tablet  sulfamethoxazole-trimethoprim (BACTRIM SS) 400-80 mg tablet Indications: bacterial urinary  tract infection  triamcinolone 0.1 % ointment Apply topically   No current facility-administered medications on file prior to visit.   Family History  Problem Relation Age of Onset  High blood pressure (Hypertension) Father  Hyperlipidemia (Elevated cholesterol) Father  Coronary Artery Disease (Blocked arteries around heart) Father  Stroke Sister    Social History   Tobacco Use  Smoking Status Former  Types: Cigarettes  Smokeless Tobacco Never    Social History   Socioeconomic History  Marital status: Widowed  Tobacco Use  Smoking status: Former  Types: Cigarettes  Smokeless tobacco: Never  Substance and Sexual Activity  Alcohol use: Not Currently  Drug use: Never   Social Determinants of Health   Financial Resource Strain: Low Risk (07/20/2022)  Received from [facility]  Overall Financial Resource Strain (CARDIA)  Difficulty of Paying Living Expenses: Not hard at all  Food Insecurity: No Food Insecurity (07/20/2022)  Received from [facility]  Hunger Vital Sign  Worried About Running Out of Food in the Last Year: Never true  Ran Out of Food in the Last Year: Never true  Transportation Needs: No Transportation Needs (07/20/2022)  Received from [facility] - Transportation  Lack of Transportation (Medical): No  Lack of Transportation (Non-Medical): No  Physical Activity: Unknown (09/14/2021)  Received from [facility] visits prior to 04/23/2022.  Exercise Vital Sign  Days of Exercise per Week: Patient refused  Stress: Patient Declined (09/14/2021)  Received from [facility] visits prior to 04/23/2022.  [employer] of Occupational Health - Occupational Stress Questionnaire  Feeling of Stress : Patient declined  Social Connections: Unknown (09/14/2021)  Received from [facility] visits prior to 04/23/2022.  Social Advertising account executive [NHANES]  Frequency of  Communication with Friends and Family: Patient refused  Frequency of Social Gatherings with Friends and Family: Patient refused  Attends Religious Services: Patient refused  Database administrator or Organizations: Patient refused  Attends Engineer, structural: Patient refused  Marital Status: Patient refused   Objective:   Vitals:   BP: 132/82  Pulse: 88  Temp: 36.1 C (97 F)  SpO2: 96%  Weight: 67.1 kg (148 lb)  Height: 144.8 cm (4\' 9" )  PainSc: 0-No pain   Body mass index is 32.03 kg/m.  Physical Exam   She appears well on exam and is conversant and follows commands  She has large breast bilaterally. I have a difficult time feeling the mass very well secondary to the bruising and ecchymosis at the 12 o'clock position of the left breast. There is also some thickness around the areola and bruising at the 6 o'clock position of the breast.  There is no axillary adenopathy on either side  Labs, Imaging and Diagnostic Testing: I have reviewed her mammograms, ultrasound, and pathology results  Assessment and Plan:   Diagnoses and all orders for this visit:  Invasive ductal carcinoma of breast, left (CMS/HHS-HCC)   I discussed the diagnosis with the patient and her daughter and gave him a copy of the pathology results. We discussed the multidisciplinary approach  to breast cancer. From a surgical standpoint we discussed breast conservation versus mastectomy. She is interested in breast conservation. As I cannot feel this as well as I had hoped, I would recommend a radioactive seed guided left breast lumpectomy from a surgical standpoint. I explained the surgical procedure in detail. We discussed the risk which includes but is not limited to bleeding, infection, the need for further surgery if margins are positive, cardiopulmonary issues, postoperative recovery, etc. We will need to get cardiac clearance for surgery and she will need to stop her blood thinning medication  for 2 days preoperatively. We will also refer to medical and radiation oncology.  She and her daughter understand agree with the plans.

## 2022-10-27 ENCOUNTER — Ambulatory Visit (HOSPITAL_BASED_OUTPATIENT_CLINIC_OR_DEPARTMENT_OTHER)
Admission: RE | Admit: 2022-10-27 | Discharge: 2022-10-28 | Disposition: A | Discharge status: Home / Self Care | Payer: Medicare Other | Attending: Surgery | Admitting: Surgery

## 2022-10-27 ENCOUNTER — Other Ambulatory Visit: Payer: Self-pay

## 2022-10-27 ENCOUNTER — Ambulatory Visit (HOSPITAL_BASED_OUTPATIENT_CLINIC_OR_DEPARTMENT_OTHER): Payer: Medicare Other | Admitting: Anesthesiology

## 2022-10-27 ENCOUNTER — Encounter (HOSPITAL_BASED_OUTPATIENT_CLINIC_OR_DEPARTMENT_OTHER): Payer: Self-pay | Admitting: Surgery

## 2022-10-27 ENCOUNTER — Ambulatory Visit
Admission: RE | Admit: 2022-10-27 | Discharge: 2022-10-27 | Disposition: A | Discharge status: Home / Self Care | Payer: Medicare Other | Source: Ambulatory Visit | Attending: Surgery | Admitting: Surgery

## 2022-10-27 ENCOUNTER — Encounter (HOSPITAL_BASED_OUTPATIENT_CLINIC_OR_DEPARTMENT_OTHER)
Admission: RE | Disposition: A | Discharge status: Home / Self Care | Payer: Self-pay | Source: Home / Self Care | Attending: Surgery

## 2022-10-27 DIAGNOSIS — C50812 Malignant neoplasm of overlapping sites of left female breast: Secondary | ICD-10-CM

## 2022-10-27 DIAGNOSIS — Z01818 Encounter for other preprocedural examination: Secondary | ICD-10-CM

## 2022-10-27 DIAGNOSIS — Z87891 Personal history of nicotine dependence: Secondary | ICD-10-CM | POA: Insufficient documentation

## 2022-10-27 DIAGNOSIS — C50912 Malignant neoplasm of unspecified site of left female breast: Secondary | ICD-10-CM | POA: Insufficient documentation

## 2022-10-27 DIAGNOSIS — I4891 Unspecified atrial fibrillation: Secondary | ICD-10-CM | POA: Insufficient documentation

## 2022-10-27 DIAGNOSIS — Z8673 Personal history of transient ischemic attack (TIA), and cerebral infarction without residual deficits: Secondary | ICD-10-CM | POA: Diagnosis not present

## 2022-10-27 DIAGNOSIS — I509 Heart failure, unspecified: Secondary | ICD-10-CM | POA: Insufficient documentation

## 2022-10-27 DIAGNOSIS — N6325 Unspecified lump in the left breast, overlapping quadrants: Secondary | ICD-10-CM | POA: Insufficient documentation

## 2022-10-27 DIAGNOSIS — I110 Hypertensive heart disease with heart failure: Secondary | ICD-10-CM | POA: Insufficient documentation

## 2022-10-27 DIAGNOSIS — E119 Type 2 diabetes mellitus without complications: Secondary | ICD-10-CM | POA: Diagnosis not present

## 2022-10-27 DIAGNOSIS — Z7984 Long term (current) use of oral hypoglycemic drugs: Secondary | ICD-10-CM | POA: Diagnosis not present

## 2022-10-27 DIAGNOSIS — Z170 Estrogen receptor positive status [ER+]: Secondary | ICD-10-CM | POA: Insufficient documentation

## 2022-10-27 DIAGNOSIS — Z7901 Long term (current) use of anticoagulants: Secondary | ICD-10-CM | POA: Insufficient documentation

## 2022-10-27 DIAGNOSIS — Z853 Personal history of malignant neoplasm of breast: Secondary | ICD-10-CM

## 2022-10-27 DIAGNOSIS — Z79899 Other long term (current) drug therapy: Secondary | ICD-10-CM | POA: Insufficient documentation

## 2022-10-27 DIAGNOSIS — K219 Gastro-esophageal reflux disease without esophagitis: Secondary | ICD-10-CM | POA: Insufficient documentation

## 2022-10-27 HISTORY — PX: BREAST LUMPECTOMY WITH RADIOACTIVE SEED LOCALIZATION: SHX6424

## 2022-10-27 LAB — GLUCOSE, CAPILLARY
Glucose-Capillary: 99 mg/dL (ref 70–99)
Glucose-Capillary: 109 mg/dL — ABNORMAL HIGH (ref 70–99)

## 2022-10-27 SURGERY — BREAST LUMPECTOMY WITH RADIOACTIVE SEED LOCALIZATION
Anesthesia: General | Site: Breast | Laterality: Left

## 2022-10-27 MED ORDER — ONDANSETRON HCL 4 MG/2ML IJ SOLN
INTRAMUSCULAR | Status: AC
  Filled 2022-10-27: qty 2

## 2022-10-27 MED ORDER — FAMOTIDINE 20 MG PO TABS: 40.0000 mg | ORAL_TABLET | Freq: Every evening | ORAL | Status: DC | PRN

## 2022-10-27 MED ORDER — LACTATED RINGERS IV SOLN: INTRAVENOUS | Status: DC

## 2022-10-27 MED ORDER — FENTANYL CITRATE (PF) 100 MCG/2ML IJ SOLN
INTRAMUSCULAR | Status: AC
  Filled 2022-10-27: qty 2

## 2022-10-27 MED ORDER — PROPOFOL 10 MG/ML IV BOLUS
INTRAVENOUS | Status: DC | PRN
Start: 2022-10-27 — End: 2022-10-27
  Administered 2022-10-27: 80 mg via INTRAVENOUS
  Administered 2022-10-27: 20 mg via INTRAVENOUS

## 2022-10-27 MED ORDER — DIPHENHYDRAMINE HCL 12.5 MG/5ML PO ELIX: 12.5000 mg | ORAL_SOLUTION | Freq: Four times a day (QID) | ORAL | Status: DC | PRN

## 2022-10-27 MED ORDER — METFORMIN HCL ER 500 MG PO TB24
500.0000 mg | ORAL_TABLET | Freq: Every day | ORAL | Status: DC
  Filled 2022-10-27: qty 1

## 2022-10-27 MED ORDER — CEFAZOLIN SODIUM-DEXTROSE 2-4 GM/100ML-% IV SOLN
INTRAVENOUS | Status: AC
  Filled 2022-10-27: qty 100

## 2022-10-27 MED ORDER — ACETAMINOPHEN 500 MG PO TABS: 1000.0000 mg | ORAL_TABLET | Freq: Once | ORAL | Status: DC

## 2022-10-27 MED ORDER — LIDOCAINE HCL (CARDIAC) PF 100 MG/5ML IV SOSY
PREFILLED_SYRINGE | INTRAVENOUS | Status: DC | PRN
  Administered 2022-10-27: 100 mg via INTRAVENOUS

## 2022-10-27 MED ORDER — TRAMADOL HCL 50 MG PO TABS: 50.0000 mg | ORAL_TABLET | Freq: Four times a day (QID) | ORAL | Status: DC | PRN

## 2022-10-27 MED ORDER — POTASSIUM CHLORIDE CRYS ER 20 MEQ PO TBCR
20.0000 meq | EXTENDED_RELEASE_TABLET | Freq: Every day | ORAL | Status: DC
  Filled 2022-10-27 (×2): qty 1

## 2022-10-27 MED ORDER — DIPHENHYDRAMINE HCL 50 MG/ML IJ SOLN: 12.5000 mg | Freq: Four times a day (QID) | INTRAMUSCULAR | Status: DC | PRN

## 2022-10-27 MED ORDER — MORPHINE SULFATE (PF) 4 MG/ML IV SOLN: 1.0000 mg | INTRAVENOUS | Status: DC | PRN

## 2022-10-27 MED ORDER — PRAMIPEXOLE DIHYDROCHLORIDE 1 MG PO TABS
1.0000 mg | ORAL_TABLET | Freq: Every day | ORAL | Status: DC
  Filled 2022-10-27 (×2): qty 1

## 2022-10-27 MED ORDER — ACETAMINOPHEN 325 MG RE SUPP: 650.0000 mg | Freq: Four times a day (QID) | RECTAL | Status: DC | PRN

## 2022-10-27 MED ORDER — DROPERIDOL 2.5 MG/ML IJ SOLN: 0.6250 mg | Freq: Once | INTRAMUSCULAR | Status: DC | PRN

## 2022-10-27 MED ORDER — BUPIVACAINE-EPINEPHRINE 0.5% -1:200000 IJ SOLN
INTRAMUSCULAR | Status: DC | PRN
  Administered 2022-10-27: 20 mL

## 2022-10-27 MED ORDER — ONDANSETRON HCL 4 MG/2ML IJ SOLN
INTRAMUSCULAR | Status: DC | PRN
  Administered 2022-10-27: 4 mg via INTRAVENOUS

## 2022-10-27 MED ORDER — EPHEDRINE 5 MG/ML INJ
INTRAVENOUS | Status: AC
  Filled 2022-10-27: qty 15

## 2022-10-27 MED ORDER — PHENYLEPHRINE HCL (PRESSORS) 10 MG/ML IV SOLN
INTRAVENOUS | Status: DC | PRN
  Administered 2022-10-27 (×2): 80 ug via INTRAVENOUS

## 2022-10-27 MED ORDER — DEXAMETHASONE SODIUM PHOSPHATE 4 MG/ML IJ SOLN
INTRAMUSCULAR | Status: DC | PRN
  Administered 2022-10-27: 4 mg via INTRAVENOUS

## 2022-10-27 MED ORDER — FENTANYL CITRATE (PF) 100 MCG/2ML IJ SOLN: 25.0000 ug | INTRAMUSCULAR | Status: DC | PRN

## 2022-10-27 MED ORDER — ACETAMINOPHEN 325 MG PO TABS
650.0000 mg | ORAL_TABLET | Freq: Four times a day (QID) | ORAL | Status: DC | PRN
  Administered 2022-10-27: 650 mg via ORAL
  Filled 2022-10-27: qty 2

## 2022-10-27 MED ORDER — PRAVASTATIN SODIUM 10 MG PO TABS
20.0000 mg | ORAL_TABLET | Freq: Every day | ORAL | Status: DC
  Filled 2022-10-27 (×2): qty 2

## 2022-10-27 MED ORDER — ONDANSETRON HCL 4 MG/2ML IJ SOLN: 4.0000 mg | Freq: Four times a day (QID) | INTRAMUSCULAR | Status: DC | PRN

## 2022-10-27 MED ORDER — PHENYLEPHRINE 80 MCG/ML (10ML) SYRINGE FOR IV PUSH (FOR BLOOD PRESSURE SUPPORT)
PREFILLED_SYRINGE | INTRAVENOUS | Status: AC
  Filled 2022-10-27: qty 30

## 2022-10-27 MED ORDER — ONDANSETRON 4 MG PO TBDP: 4.0000 mg | ORAL_TABLET | Freq: Four times a day (QID) | ORAL | Status: DC | PRN

## 2022-10-27 MED ORDER — KETOROLAC TROMETHAMINE 30 MG/ML IJ SOLN
INTRAMUSCULAR | Status: AC
  Filled 2022-10-27: qty 1

## 2022-10-27 MED ORDER — FENTANYL CITRATE (PF) 100 MCG/2ML IJ SOLN
INTRAMUSCULAR | Status: DC | PRN
  Administered 2022-10-27: 50 ug via INTRAVENOUS

## 2022-10-27 MED ORDER — ACETAMINOPHEN 500 MG PO TABS
1000.0000 mg | ORAL_TABLET | ORAL | Status: AC
  Administered 2022-10-27: 1000 mg via ORAL

## 2022-10-27 MED ORDER — CEFAZOLIN SODIUM-DEXTROSE 2-4 GM/100ML-% IV SOLN
2.0000 g | INTRAVENOUS | Status: AC
  Administered 2022-10-27: 2 g via INTRAVENOUS

## 2022-10-27 MED ORDER — FUROSEMIDE 40 MG PO TABS
40.0000 mg | ORAL_TABLET | Freq: Two times a day (BID) | ORAL | Status: DC
  Filled 2022-10-27 (×2): qty 1

## 2022-10-27 MED ORDER — ACETAMINOPHEN 500 MG PO TABS
ORAL_TABLET | ORAL | Status: AC
  Filled 2022-10-27: qty 2

## 2022-10-27 SURGICAL SUPPLY — 47 items
ADH SKN CLS APL DERMABOND .7 (GAUZE/BANDAGES/DRESSINGS) ×1
APL PRP STRL LF DISP 70% ISPRP (MISCELLANEOUS) ×1
APPLIER CLIP 9.375 MED OPEN (MISCELLANEOUS)
APR CLP MED 9.3 20 MLT OPN (MISCELLANEOUS)
BINDER BREAST XLRG (GAUZE/BANDAGES/DRESSINGS) IMPLANT
BINDER BREAST XXLRG (GAUZE/BANDAGES/DRESSINGS) IMPLANT
BLADE SURG 15 STRL LF DISP TIS (BLADE) ×1 IMPLANT
BLADE SURG 15 STRL SS (BLADE) ×1
CANISTER SUC SOCK COL 7IN (MISCELLANEOUS) IMPLANT
CANISTER SUCT 1200ML W/VALVE (MISCELLANEOUS) IMPLANT
CHLORAPREP W/TINT 26 (MISCELLANEOUS) ×1 IMPLANT
CLIP APPLIE 9.375 MED OPEN (MISCELLANEOUS) IMPLANT
COVER BACK TABLE 60X90IN (DRAPES) ×1 IMPLANT
COVER MAYO STAND STRL (DRAPES) ×1 IMPLANT
COVER PROBE CYLINDRICAL 5X96 (MISCELLANEOUS) ×1 IMPLANT
DERMABOND ADVANCED .7 DNX12 (GAUZE/BANDAGES/DRESSINGS) ×1 IMPLANT
DRAPE LAPAROSCOPIC ABDOMINAL (DRAPES) ×1 IMPLANT
DRAPE UTILITY XL STRL (DRAPES) ×1 IMPLANT
ELECT REM PT RETURN 9FT ADLT (ELECTROSURGICAL) ×1
ELECTRODE REM PT RTRN 9FT ADLT (ELECTROSURGICAL) ×1 IMPLANT
GAUZE SPONGE 4X4 12PLY STRL LF (GAUZE/BANDAGES/DRESSINGS) IMPLANT
GLOVE BIOGEL PI IND STRL 6.5 (GLOVE) IMPLANT
GLOVE SURG SS PI 6.5 STRL IVOR (GLOVE) IMPLANT
GLOVE SURG SYN 7.5 E (GLOVE) ×1
GLOVE SURG SYN 7.5 PF PI (GLOVE) IMPLANT
GOWN STRL REUS W/ TWL LRG LVL3 (GOWN DISPOSABLE) ×1 IMPLANT
GOWN STRL REUS W/ TWL XL LVL3 (GOWN DISPOSABLE) ×1 IMPLANT
GOWN STRL REUS W/TWL LRG LVL3 (GOWN DISPOSABLE) ×1
GOWN STRL REUS W/TWL XL LVL3 (GOWN DISPOSABLE) ×1
KIT MARKER MARGIN INK (KITS) ×1 IMPLANT
NDL HYPO 25X1 1.5 SAFETY (NEEDLE) ×1 IMPLANT
NEEDLE HYPO 25X1 1.5 SAFETY (NEEDLE) ×1
NS IRRIG 1000ML POUR BTL (IV SOLUTION) IMPLANT
PACK BASIN DAY SURGERY FS (CUSTOM PROCEDURE TRAY) ×1 IMPLANT
PENCIL SMOKE EVACUATOR (MISCELLANEOUS) ×1 IMPLANT
SLEEVE SCD COMPRESS KNEE MED (STOCKING) ×1 IMPLANT
SPIKE FLUID TRANSFER (MISCELLANEOUS) IMPLANT
SPONGE T-LAP 4X18 ~~LOC~~+RFID (SPONGE) ×1 IMPLANT
SUT MNCRL AB 4-0 PS2 18 (SUTURE) ×1 IMPLANT
SUT SILK 2 0 SH (SUTURE) IMPLANT
SUT VIC AB 3-0 SH 27 (SUTURE) ×1
SUT VIC AB 3-0 SH 27X BRD (SUTURE) ×1 IMPLANT
SYR CONTROL 10ML LL (SYRINGE) ×1 IMPLANT
TOWEL GREEN STERILE FF (TOWEL DISPOSABLE) ×1 IMPLANT
TRAY FAXITRON CT DISP (TRAY / TRAY PROCEDURE) ×1 IMPLANT
TUBE CONNECTING 20X1/4 (TUBING) IMPLANT
YANKAUER SUCT BULB TIP NO VENT (SUCTIONS) IMPLANT

## 2022-10-27 NOTE — Interval H&P Note (Signed)
History and Physical Interval Note: no change in H and P  10/27/2022 11:53 AM  Dana Bradford  has presented today for surgery, with the diagnosis of LEFT BREAST CANCER.  The various methods of treatment have been discussed with the patient and family. After consideration of risks, benefits and other options for treatment, the patient has consented to  Procedure(s) with comments: LEFT BREAST LUMPECTOMY WITH RADIOACTIVE SEED LOCALIZATION (Left) - LMA as a surgical intervention.  The patient's history has been reviewed, patient examined, no change in status, stable for surgery.  I have reviewed the patient's chart and labs.  Questions were answered to the patient's satisfaction.     Abigail Miyamoto

## 2022-10-27 NOTE — Anesthesia Procedure Notes (Signed)
Procedure Name: LMA Insertion Date/Time: 10/27/2022 1:36 PM  Performed by: Earmon Phoenix, CRNAPre-anesthesia Checklist: Patient identified, Emergency Drugs available, Suction available, Patient being monitored and Timeout performed Patient Re-evaluated:Patient Re-evaluated prior to induction Oxygen Delivery Method: Circle system utilized Preoxygenation: Pre-oxygenation with 100% oxygen Induction Type: IV induction Ventilation: Mask ventilation without difficulty LMA: LMA inserted LMA Size: 4.0 Number of attempts: 1 Placement Confirmation: positive ETCO2 and breath sounds checked- equal and bilateral Tube secured with: Tape Dental Injury: Teeth and Oropharynx as per pre-operative assessment

## 2022-10-27 NOTE — Op Note (Signed)
LEFT BREAST LUMPECTOMY WITH RADIOACTIVE SEED LOCALIZATION  Procedure Note  Lanelle Hashemi 10/27/2022   Pre-op Diagnosis: LEFT BREAST CANCER     Post-op Diagnosis: same  Procedure(s): LEFT BREAST LUMPECTOMY WITH RADIOACTIVE SEED LOCALIZATION  Surgeon(s): Abigail Miyamoto, MD  Anesthesia: General  Staff:  Circulator: Maryan Rued, RN Scrub Person: Rolla Etienne  Estimated Blood Loss: Minimal               Specimens: sent to path  Procedure: The patient was brought to the operating identifies the correct patient.  She was placed upon the operating table general anesthesia was induced.  Her left breast was prepped and draped in usual sterile fashion.  Identified the radioactive seed at the 12 position 7 cm from the nipple.  There was a palpable underlying mass.  I anesthetized skin with Marcaine and made a longitudinal incision over this area with the scalpel.  I then dissected down to the breast tissue with the cautery.  The mass was palpable and I stay widely around it going to near the chest wall posteriorly.  The anterior margin is skin.  Once the lumpectomy is completely removed I marked the margins with paint.  An x-ray was performed confirming the radioactive seed and previous biopsy clip were in the specimen.  Specimen was sent to pathology for evaluation.  I achieved hemostasis with the cautery.  I injected the wound further with Marcaine.  I then closed the subcutaneous tissue with interrupted 3-0 Vicryl sutures and closed the skin with a running 4-0 Monocryl.  Dermabond was then applied.  The patient tolerated the procedure well.  All the counts were correct at the end of the procedure.  The patient was then extubated in the operating room and taken in a stable condition to the recovery room.          Abigail Miyamoto   Date: 10/27/2022  Time: 2:09 PM

## 2022-10-27 NOTE — Transfer of Care (Signed)
Immediate Anesthesia Transfer of Care Note  Patient: Dana Bradford  Procedure(s) Performed: LEFT BREAST LUMPECTOMY WITH RADIOACTIVE SEED LOCALIZATION (Left: Breast)  Patient Location: PACU  Anesthesia Type:General  Level of Consciousness: awake, oriented, and patient cooperative  Airway & Oxygen Therapy: Patient Spontanous Breathing and Patient connected to nasal cannula oxygen  Post-op Assessment: Report given to RN and Post -op Vital signs reviewed and stable  Post vital signs: Reviewed and stable  Last Vitals:  Vitals Value Taken Time  BP    Temp    Pulse    Resp    SpO2      Last Pain:  Vitals:   10/27/22 1214  TempSrc: Tympanic  PainSc: 0-No pain      Patients Stated Pain Goal: 6 (10/27/22 1214)  Complications: No notable events documented.

## 2022-10-27 NOTE — Anesthesia Postprocedure Evaluation (Signed)
Anesthesia Post Note  Patient: Dana Bradford  Procedure(s) Performed: LEFT BREAST LUMPECTOMY WITH RADIOACTIVE SEED LOCALIZATION (Left: Breast)     Patient location during evaluation: PACU Anesthesia Type: General Level of consciousness: awake and alert Pain management: pain level controlled Vital Signs Assessment: post-procedure vital signs reviewed and stable Respiratory status: spontaneous breathing, nonlabored ventilation and respiratory function stable Cardiovascular status: blood pressure returned to baseline Postop Assessment: no apparent nausea or vomiting Anesthetic complications: no   No notable events documented.  Last Vitals:  Vitals:   10/27/22 1430 10/27/22 1445  BP: 110/83 107/73  Pulse: 96 94  Resp: 17 16  Temp:    SpO2: 97% 95%    Last Pain:  Vitals:   10/27/22 1445  TempSrc:   PainSc: 0-No pain                 Shanda Howells

## 2022-10-28 ENCOUNTER — Encounter (HOSPITAL_BASED_OUTPATIENT_CLINIC_OR_DEPARTMENT_OTHER): Payer: Self-pay | Admitting: Surgery

## 2022-10-28 NOTE — Discharge Instructions (Signed)
[facility] Office Phone Number [phone]  BREAST BIOPSY/ PARTIAL MASTECTOMY: POST OP INSTRUCTIONS  Always review your discharge instruction sheet given to you by the facility where your surgery was performed.  IF YOU HAVE DISABILITY OR FAMILY LEAVE FORMS, YOU MUST BRING THEM TO THE OFFICE FOR PROCESSING.  DO NOT GIVE THEM TO YOUR DOCTOR.  A prescription for pain medication may be given to you upon discharge.  Take your pain medication as prescribed, if needed.  If narcotic pain medicine is not needed, then you may take acetaminophen (Tylenol) or ibuprofen (Advil) as needed. Take your usually prescribed medications unless otherwise directed If you need a refill on your pain medication, please contact your pharmacy.  They will contact our office to request authorization.  Prescriptions will not be filled after 5pm or on week-ends. You should eat very light the first 24 hours after surgery, such as soup, crackers, pudding, etc.  Resume your normal diet the day after surgery. Most patients will experience some swelling and bruising in the breast.  Ice packs and a good support bra will help.  Swelling and bruising can take several days to resolve.  It is common to experience some constipation if taking pain medication after surgery.  Increasing fluid intake and taking a stool softener will usually help or prevent this problem from occurring.  A mild laxative (Milk of Magnesia or Miralax) should be taken according to package directions if there are no bowel movements after 48 hours. Unless discharge instructions indicate otherwise, you may remove your bandages 24-48 hours after surgery, and you may shower at that time.  You may have steri-strips (small skin tapes) in place directly over the incision.  These strips should be left on the skin for 7-10 days.  If your surgeon used skin glue on the incision, you may shower in 24 hours.  The glue will flake off over the next 2-3 weeks.  Any  sutures or staples will be removed at the office during your follow-up visit. ACTIVITIES:  You may resume regular daily activities (gradually increasing) beginning the next day.  Wearing a good support bra or sports bra minimizes pain and swelling.  You may have sexual intercourse when it is comfortable. You may drive when you no longer are taking prescription pain medication, you can comfortably wear a seatbelt, and you can safely maneuver your car and apply brakes. RETURN TO WORK:  ______________________________________________________________________________________ Dana Bradford should see your doctor in the office for a follow-up appointment approximately two weeks after your surgery.  Your doctor's nurse will typically make your follow-up appointment when she calls you with your pathology report.  Expect your pathology report 2-3 business days after your surgery.  You may call to check if you do not hear from [place] after three days. OTHER INSTRUCTIONS: __OK TO RESUME ELIQUIS TODAY  ICE PACK AND TYLENOL FOR PAIN OK TO SHOWER TODAY_____________________________________________________________________________________________ _____________________________________________________________________________________________________________________________________ _____________________________________________________________________________________________________________________________________ _____________________________________________________________________________________________________________________________________  WHEN TO CALL YOUR DOCTOR: Fever over 101.0 Nausea and/or vomiting. Extreme swelling or bruising. Continued bleeding from incision. Increased pain, redness, or drainage from the incision.  The clinic staff is available to answer your questions during regular business hours.  Please don't hesitate to call and ask to speak to one of the nurses for clinical concerns.  If you have a medical  emergency, go to the nearest emergency room or call 911.  A surgeon from [facility] is always on call at the hospital.  For further questions, please visit [email or web address]

## 2022-10-28 NOTE — Discharge Summary (Signed)
Physician Discharge Summary  Patient ID: Dana Bradford MRN: [id number] DOB/AGE: May 19, 1930 89 y.o.  Admit date: 10/27/2022 Discharge date: 10/28/2022  Admission Diagnoses: left breast cancer  Discharge Diagnoses:  Active Problems:   * No active hospital problems. * Left breast cancer   Discharged Condition: good  Hospital Course: uneventful post operative recovery.  Discharged home POD#1  Consults: None  Significant Diagnostic Studies:   Treatments: surgery: radioactive seed guided left breast lumpectomy  Discharge Exam: Blood pressure 115/82, pulse 64, temperature 98 F (36.7 C), resp. rate 16, height 4\' 10"  (1.473 m), weight 67 kg, SpO2 94%. General appearance: alert, cooperative, and no distress Resp: clear to auscultation bilaterally Incision/Wound: left breast incision clean, no hematoma  Disposition: Discharge disposition: 01-Home or Self Care        Allergies as of 10/28/2022       Reactions   Arthrotec [diclofenac-misoprostol] Shortness Of Breath, Diarrhea   Doxycycline Nausea And Vomiting, Rash   Significant enough to d/c treatment and refuse to take more Vomiting (intolerance) The first time she took it she had N/V and the second she had a rash.     Latex Rash, Other (See Comments)   Amlodipine    angioedema   Celebrex [celecoxib] Other (See Comments)   GI Bleeding   Vancomycin    Acute kidney failure   Diclofenac Diarrhea   Shellfish Allergy Itching, Rash        Medication List     TAKE these medications    apixaban 2.5 MG Tabs tablet Commonly known as: ELIQUIS Take 1 tablet (2.5 mg total) by mouth 2 (two) times daily.   ascorbic acid 500 MG tablet Commonly known as: VITAMIN C Take 500 mg by mouth 3 (three) times a week. Monday , Wednesday and Friday   clotrimazole-betamethasone cream Commonly known as: LOTRISONE Apply 1 Application topically daily as needed or as directed What changed: reasons to take this   colchicine 0.6 MG  tablet Take 1 tablet (0.6 mg total) by mouth daily.   famotidine 40 MG tablet Commonly known as: PEPCID Take 1 tablet (40 mg total) by mouth nightly as needed for Heartburn. What changed:  how much to take how to take this when to take this reasons to take this   ferrous sulfate 325 (65 FE) MG tablet Take 325 mg by mouth 2 (two) times daily with a meal.   furosemide 40 MG tablet Commonly known as: LASIX Take 1 tablet (40 mg total) by mouth 2 (two) times daily.   levothyroxine 88 MCG tablet Commonly known as: SYNTHROID Take 1 tablet (88 mcg total) by mouth daily before breakfast.   metFORMIN 500 MG 24 hr tablet Commonly known as: GLUCOPHAGE-XR TAKE 1 TABLET AT BEDTIME   metolazone 2.5 MG tablet Commonly known as: ZAROXOLYN Take 1 tablet (2.5 mg total) by mouth once a week. monday's   metoprolol succinate 50 MG 24 hr tablet Commonly known as: TOPROL-XL TAKE 1 TABLET IN THE MORNING AND ONE-HALF (1/2) TABLET IN THE EVENING DAILY AS DIRECTED   omeprazole 40 MG capsule Commonly known as: PRILOSEC Take 1 capsule (40 mg total) by mouth in the morning.   potassium chloride SA 20 MEQ tablet Commonly known as: Klor-Con M20 Take 1 tablet (20 mEq total) by mouth daily.   pramipexole 1 MG tablet Commonly known as: MIRAPEX Take 1 tablet by mouth every night   pravastatin 20 MG tablet Commonly known as: PRAVACHOL Take 1 tablet (20 mg total) by mouth daily.  triamcinolone ointment 0.1 % Commonly known as: KENALOG Apply 1 application topically 2 (two) times daily as needed (skin irritation.).   Vitamin D3 50 MCG (2000 UT) Tabs Take 2,000 Units by mouth daily.        Follow-up Information     Abigail Miyamoto, MD. Schedule an appointment as soon as possible for a visit in 4 week(s).   Specialty: General Surgery Contact information: [address] [place] [address] [phone]                 Signed: Abigail Miyamoto 10/28/2022, 7:41  AM

## 2022-10-31 LAB — SURGICAL PATHOLOGY

## 2022-11-01 ENCOUNTER — Encounter: Payer: Self-pay | Admitting: *Deleted

## 2022-11-01 ENCOUNTER — Telehealth: Payer: Self-pay | Admitting: Radiation Oncology

## 2022-11-01 NOTE — Telephone Encounter (Signed)
I called and spoke with the patient's daughter after reviewing Ms. Blank's pathology report. Given the grade 2 and T2 tumor size, she would meet criteria for adjuvant radiation. The patient's daughter is also waiting to follow up with the other providers and would like to meet back with Dr. Pamelia Hoit to discuss antiestrogen therapy. We will follow up with their discussion, and would meet back with the patient and her daughter to discuss proceeding with radiation as well as Ms. Huston Foley feels her mother is open to radiation.      Osker Mason, PAC

## 2022-11-02 ENCOUNTER — Encounter: Payer: Self-pay | Admitting: Hematology and Oncology

## 2022-11-03 ENCOUNTER — Encounter: Payer: Self-pay | Admitting: *Deleted

## 2022-11-03 ENCOUNTER — Telehealth: Payer: Self-pay | Admitting: *Deleted

## 2022-11-03 NOTE — Telephone Encounter (Signed)
Spoke with daughter Elnita Maxwell and confirmed post op appt for 9/20 at 1215pm.

## 2022-11-11 ENCOUNTER — Inpatient Hospital Stay: Payer: Medicare Other | Attending: Hematology and Oncology | Admitting: Hematology and Oncology

## 2022-11-11 VITALS — BP 129/92 | HR 92 | Temp 97.5°F | Resp 18 | Ht <= 58 in | Wt 146.0 lb

## 2022-11-11 DIAGNOSIS — C50812 Malignant neoplasm of overlapping sites of left female breast: Secondary | ICD-10-CM | POA: Insufficient documentation

## 2022-11-11 DIAGNOSIS — Z170 Estrogen receptor positive status [ER+]: Secondary | ICD-10-CM | POA: Diagnosis not present

## 2022-11-11 NOTE — Assessment & Plan Note (Signed)
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 I discussed the margins as well as lymph node surgeries. We also discussed the final staging along with previously performed ER/PR and HER-2/neu testing.  Treatment plan: Followed by adjuvant antiestrogen therapy (optional) Discussed the pros cons of antiestrogen therapy with anastrozole  Return to clinic in 3 months for survivorship care plan visit

## 2022-11-11 NOTE — Progress Notes (Signed)
Patient Care Team: Clayborne Dana, NP as PCP - General (Family Medicine) Pershing Proud, RN as Oncology Nurse Navigator Donnelly Angelica, RN as Oncology Nurse Navigator Serena Croissant, MD as Consulting Physician (Hematology and Oncology)  DIAGNOSIS:  Encounter Diagnosis  Name Primary?   Malignant neoplasm of overlapping sites of left breast in female, estrogen receptor positive (HCC) Yes    SUMMARY OF ONCOLOGIC HISTORY: Oncology History  Malignant neoplasm of overlapping sites of left female breast (HCC)  08/26/2022 Initial Diagnosis   Painless palpable left breast lump (right mastectomy 50-60 years ago): 1.5 cm mass by ultrasound measured 2 cm, indeterminate 0.5 cm left breast mass (fibroadenoma); biopsy of the 2 cm mass: Grade 2 IDC ER 95%, PR 95%, Ki67 20%, HER2 0 by [facility]    09/26/2022 Cancer Staging   Staging form: Breast, AJCC 8th Edition - Clinical: Stage IA (cT1c, cN0, cM0, G2, ER+, PR+, HER2-) - Signed by Ronny Bacon, PA-C on 09/26/2022 Stage prefix: Initial diagnosis Method of lymph node assessment: Clinical Histologic grading system: 3 grade system   10/27/2022 Surgery   Left lumpectomy: 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  History of Present Illness   The patient, with a recent diagnosis of ductal breast cancer, presents for a post-operative consultation following successful surgical removal of the tumor. The final size of the tumor was 2.3 centimeters, with a grade of 2. The patient reports satisfaction with the surgical team and the procedure, noting that the surgical site is healing well. The pathology report indicates that all cancerous and precancerous cells were successfully removed, with negative resection margins. The cancer was found to be estrogen and  progesterone receptor positive, and HER2 negative, with a low growth rate of 20%. The patient is currently considered cancer-free.  The patient also has a history of osteoporosis and joint stiffness, which are relevant in considering potential side effects of further treatment options. The patient expresses a preference for avoiding treatments that could exacerbate these conditions.         ALLERGIES:  is allergic to arthrotec [diclofenac-misoprostol], doxycycline, latex, amlodipine, celebrex [celecoxib], vancomycin, diclofenac, and shellfish allergy.  MEDICATIONS:  Current Outpatient Medications  Medication Sig Dispense Refill   apixaban (ELIQUIS) 2.5 MG TABS tablet Take 1 tablet (2.5 mg total) by mouth 2 (two) times daily. 180 tablet 3   Cholecalciferol (VITAMIN D3) 50 MCG (2000 UT) TABS Take 2,000 Units by mouth daily.     clotrimazole-betamethasone (LOTRISONE) cream Apply 1 Application topically daily as needed or as directed (Patient taking differently: Apply 1 Application topically daily as needed (rash).) 45 g 5   colchicine 0.6 MG tablet Take 1 tablet (0.6 mg total) by mouth daily. 90 tablet 3   famotidine (PEPCID) 40 MG tablet Take 1 tablet (40 mg total) by mouth nightly as needed for Heartburn. (Patient taking differently: Take 40 mg by mouth at bedtime as needed for heartburn or indigestion.) 90 tablet 1   ferrous sulfate 325 (65 FE) MG tablet Take 325 mg by mouth 2 (two) times daily with a meal.     furosemide (LASIX) 40 MG tablet Take 1 tablet (40 mg total) by mouth 2 (two) times daily. 180 tablet 3   levothyroxine (SYNTHROID) 88 MCG tablet Take 1 tablet (  88 mcg total) by mouth daily before breakfast. 90 tablet 3   metFORMIN (GLUCOPHAGE-XR) 500 MG 24 hr tablet TAKE 1 TABLET AT BEDTIME 90 tablet 3   metolazone (ZAROXOLYN) 2.5 MG tablet Take 1 tablet (2.5 mg total) by mouth once a week. monday's 12 tablet 3   metoprolol succinate (TOPROL-XL) 50 MG 24 hr tablet TAKE 1 TABLET IN THE  MORNING AND ONE-HALF (1/2) TABLET IN THE EVENING DAILY AS DIRECTED 135 tablet 3   omeprazole (PRILOSEC) 40 MG capsule Take 1 capsule (40 mg total) by mouth in the morning. 90 capsule 1   potassium chloride SA (KLOR-CON M20) 20 MEQ tablet Take 1 tablet (20 mEq total) by mouth daily. 90 tablet 3   pramipexole (MIRAPEX) 1 MG tablet Take 1 tablet by mouth every night 90 tablet 1   pravastatin (PRAVACHOL) 20 MG tablet Take 1 tablet (20 mg total) by mouth daily. 90 tablet 3   triamcinolone ointment (KENALOG) 0.1 % Apply 1 application topically 2 (two) times daily as needed (skin irritation.).     vitamin C (ASCORBIC ACID) 500 MG tablet Take 500 mg by mouth 3 (three) times a week. Monday , Wednesday and Friday     No current facility-administered medications for this visit.    PHYSICAL EXAMINATION: ECOG PERFORMANCE STATUS: 1 - Symptomatic but completely ambulatory  Vitals:   11/11/22 1208  BP: (!) 129/92  Pulse: 92  Resp: 18  Temp: (!) 97.5 F (36.4 C)  SpO2: 96%   Filed Weights   11/11/22 1208  Weight: 146 lb (66.2 kg)    LABORATORY DATA:  I have reviewed the data as listed    Latest Ref Rng & Units 10/21/2022    1:44 PM 06/27/2022    2:46 PM 02/22/2022    2:48 PM  CMP  Glucose 70 - 99 mg/dL 010  92  CANCELED   BUN 8 - 23 mg/dL 27  30  CANCELED   Creatinine 0.44 - 1.00 mg/dL 2.72  5.36  CANCELED   Sodium 135 - 145 mmol/L 140  138  CANCELED   Potassium 3.5 - 5.1 mmol/L 3.9  3.5  CANCELED   Chloride 98 - 111 mmol/L 96  98  CANCELED   CO2 22 - 32 mmol/L 27  31  CANCELED   Calcium 8.9 - 10.3 mg/dL 9.1  8.9  CANCELED   Total Protein 6.0 - 8.3 g/dL  7.2  CANCELED   Total Bilirubin 0.2 - 1.2 mg/dL  0.5  CANCELED   Alkaline Phos 39 - 117 U/L  69  CANCELED   AST 0 - 37 U/L  20  CANCELED   ALT 0 - 35 U/L  20  CANCELED     Lab Results  Component Value Date   WBC 5.9 06/27/2022   HGB 13.9 06/27/2022   HCT 40.7 06/27/2022   MCV 96.0 06/27/2022   PLT 222.0 06/27/2022   NEUTROABS 2.3  06/27/2022    ASSESSMENT & PLAN:  Malignant neoplasm of overlapping sites of left female breast (HCC) 10/27/2022:Left lumpectomy: 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 I discussed the margins as well as lymph node surgeries. We also discussed the final staging along with previously performed ER/PR and HER-2/neu testing.  Treatment plan: Followed by adjuvant antiestrogen therapy (optional) Discussed the pros cons of antiestrogen therapy  with anastrozole She decided that she doesn't want to do HT at this time  Return to clinic in 3 months for survivorship care plan visit ------------------------------------- Assessment and Plan    Breast Cancer (Ductal Carcinoma) Successful surgical removal of 2.3 cm tumor, with negative resection margins indicating no residual disease. Pathology report showed Grade 2, ER/PR positive, HER2 negative, with low growth rate (20%). No lymphovascular invasion. Final stage is 1b. Patient is currently considered cancer-free. - No further surgical intervention required at this time. - Regular mammograms to monitor for recurrence.  Adjuvant Hormone Therapy Discussed the option of Anastrozole 1mg  daily for 5 years to reduce risk of recurrence. Potential side effects include hot flashes, joint stiffness, and risk of osteoporosis. Given patient's age and current symptoms, the potential risks may outweigh the benefits. - Patient opted not to start Anastrozole at this time. Open to reconsider in the future.  Adjuvant Radiation Therapy Discussed the option of short-term radiation therapy to reduce risk of local recurrence. Potential side effects include redness, sunburn-like symptoms, and fatigue. - Patient opted not to pursue radiation therapy at this time.  Follow-up/Survivorship Care - Schedule appointment  with nurse practitioner in 3-4 months for survivorship care planning, including setting up next mammogram and discussing lifestyle modifications.          No orders of the defined types were placed in this encounter.  The patient has a good understanding of the overall plan. she agrees with it. she will call with any problems that may develop before the next visit here. Total time spent: 30 mins including face to face time and time spent for planning, charting and co-ordination of care   Tamsen Meek, MD 11/11/22

## 2022-11-14 ENCOUNTER — Encounter: Payer: Self-pay | Admitting: *Deleted

## 2022-11-14 DIAGNOSIS — C50812 Malignant neoplasm of overlapping sites of left female breast: Secondary | ICD-10-CM

## 2022-11-15 ENCOUNTER — Encounter: Payer: Self-pay | Admitting: Radiation Oncology

## 2022-11-15 NOTE — Progress Notes (Signed)
Noted discussion with Dr. Pamelia Hoit that pt has decided to forgo radiation therapy after our last call with her daughter.

## 2022-12-26 ENCOUNTER — Other Ambulatory Visit: Payer: Self-pay | Admitting: Family Medicine

## 2022-12-26 DIAGNOSIS — K219 Gastro-esophageal reflux disease without esophagitis: Secondary | ICD-10-CM

## 2023-01-17 ENCOUNTER — Other Ambulatory Visit (HOSPITAL_BASED_OUTPATIENT_CLINIC_OR_DEPARTMENT_OTHER): Payer: Self-pay

## 2023-01-17 ENCOUNTER — Ambulatory Visit (INDEPENDENT_AMBULATORY_CARE_PROVIDER_SITE_OTHER): Payer: Medicare Other | Admitting: Physician Assistant

## 2023-01-17 ENCOUNTER — Encounter: Payer: Self-pay | Admitting: Physician Assistant

## 2023-01-17 ENCOUNTER — Ambulatory Visit: Payer: Medicare Other | Admitting: Cardiology

## 2023-01-17 VITALS — BP 124/76 | HR 88 | Temp 97.9°F | Ht <= 58 in | Wt 147.2 lb

## 2023-01-17 DIAGNOSIS — L03115 Cellulitis of right lower limb: Secondary | ICD-10-CM | POA: Diagnosis not present

## 2023-01-17 MED ORDER — CEPHALEXIN 250 MG PO CAPS
250.0000 mg | ORAL_CAPSULE | Freq: Four times a day (QID) | ORAL | 0 refills | Status: AC
  Filled 2023-01-17: qty 28, 7d supply, fill #0

## 2023-01-17 MED ORDER — TRAMADOL HCL 50 MG PO TABS
50.0000 mg | ORAL_TABLET | Freq: Three times a day (TID) | ORAL | 0 refills | Status: AC | PRN
Start: 2023-01-17 — End: 2023-01-22
  Filled 2023-01-17: qty 15, 5d supply, fill #0

## 2023-01-17 NOTE — Progress Notes (Signed)
Established patient visit   Patient: Dana Bradford   DOB: 02/13/1931   89 y.o. Female  MRN: [id number] Visit Date: 01/17/2023  Today's healthcare provider: Alfredia Ferguson, PA-C   Cc. Right leg swelling, redness, pain, wound  Subjective     Pt reports a chronic nonhealing right lower extremity wound, now more painful, swollen, with spreading redness. Used to follow with wound care but stopped over the summer due to improvement.  She wears compression garments  Medications: Outpatient Medications Prior to Visit  Medication Sig   apixaban (ELIQUIS) 2.5 MG TABS tablet Take 1 tablet (2.5 mg total) by mouth 2 (two) times daily.   Cholecalciferol (VITAMIN D3) 50 MCG (2000 UT) TABS Take 2,000 Units by mouth daily.   clotrimazole-betamethasone (LOTRISONE) cream Apply 1 Application topically daily as needed or as directed (Patient taking differently: Apply 1 Application topically daily as needed (rash).)   colchicine 0.6 MG tablet Take 1 tablet (0.6 mg total) by mouth daily.   famotidine (PEPCID) 40 MG tablet Take 1 tablet (40 mg total) by mouth nightly as needed for Heartburn. (Patient taking differently: Take 40 mg by mouth at bedtime as needed for heartburn or indigestion.)   ferrous sulfate 325 (65 FE) MG tablet Take 325 mg by mouth 2 (two) times daily with a meal.   furosemide (LASIX) 40 MG tablet Take 1 tablet (40 mg total) by mouth 2 (two) times daily.   levothyroxine (SYNTHROID) 88 MCG tablet Take 1 tablet (88 mcg total) by mouth daily before breakfast.   metFORMIN (GLUCOPHAGE-XR) 500 MG 24 hr tablet TAKE 1 TABLET AT BEDTIME   metolazone (ZAROXOLYN) 2.5 MG tablet Take 1 tablet (2.5 mg total) by mouth once a week. monday's   metoprolol succinate (TOPROL-XL) 50 MG 24 hr tablet TAKE 1 TABLET IN THE MORNING AND ONE-HALF (1/2) TABLET IN THE EVENING DAILY AS DIRECTED   omeprazole (PRILOSEC) 40 MG capsule Take 1 capsule (40 mg total) by mouth daily.   potassium chloride SA (KLOR-CON M20)  20 MEQ tablet Take 1 tablet (20 mEq total) by mouth daily.   pramipexole (MIRAPEX) 1 MG tablet Take 1 tablet by mouth every night   pravastatin (PRAVACHOL) 20 MG tablet Take 1 tablet (20 mg total) by mouth daily.   triamcinolone ointment (KENALOG) 0.1 % Apply 1 application topically 2 (two) times daily as needed (skin irritation.).   vitamin C (ASCORBIC ACID) 500 MG tablet Take 500 mg by mouth 3 (three) times a week. Monday , Wednesday and Friday   No facility-administered medications prior to visit.    Review of Systems  Constitutional:  Negative for fatigue and fever.  Respiratory:  Negative for cough and shortness of breath.   Cardiovascular:  Negative for chest pain and leg swelling.  Gastrointestinal:  Negative for abdominal pain.  Skin:  Positive for wound.  Neurological:  Negative for dizziness and headaches.       Objective    BP 124/76   Pulse 88   Temp 97.9 F (36.6 C) (Oral)   Ht 4\' 10"  (1.473 m)   Wt 147 lb 4 oz (66.8 kg)   SpO2 97%   BMI 30.78 kg/m    Physical Exam Vitals reviewed.  Constitutional:      Appearance: She is not ill-appearing.  HENT:     Head: Normocephalic.  Eyes:     Conjunctiva/sclera: Conjunctivae normal.  Cardiovascular:     Rate and Rhythm: Normal rate.  Pulmonary:     Effort:  Pulmonary effort is normal. No respiratory distress.  Skin:    Comments: Right LE with erythema up to the knee, some mild edema, but pt was wearing double compression garments.   Right lateral malleolus with a 1-2 cm ulceration w/ granulation tissue. Right medial malleolus with a 1 cm area of small skin breakdown  Neurological:     General: No focal deficit present.     Mental Status: She is alert and oriented to person, place, and time.  Psychiatric:        Mood and Affect: Mood normal.        Behavior: Behavior normal.     No results found for any visits on 01/17/23.  Assessment & Plan    Cellulitis of right leg -     traMADol HCl; Take 1 tablet (50  mg total) by mouth every 8 (eight) hours as needed for up to 5 days.  Dispense: 15 tablet; Refill: 0 -     Cephalexin; Take 1 capsule (250 mg total) by mouth 4 (four) times daily for 7 days.  Dispense: 28 capsule; Refill: 0   Cont compression, elevation, movement. Rx keflex qid, tramadol for pain  Pt advised to hydrate.  She would like to stay out of wound care if abx fixes infection, advised if no improvement she f/b with wound center  Return if symptoms worsen or fail to improve.       Alfredia Ferguson, PA-C  [facility] [phone] (phone) [phone] (fax)  [facility]

## 2023-01-18 ENCOUNTER — Ambulatory Visit: Payer: Medicare Other | Admitting: Family Medicine

## 2023-01-20 ENCOUNTER — Other Ambulatory Visit: Payer: Self-pay | Admitting: Family Medicine

## 2023-01-20 ENCOUNTER — Other Ambulatory Visit: Payer: Self-pay | Admitting: Cardiology

## 2023-01-20 DIAGNOSIS — G2581 Restless legs syndrome: Secondary | ICD-10-CM

## 2023-01-20 DIAGNOSIS — K219 Gastro-esophageal reflux disease without esophagitis: Secondary | ICD-10-CM

## 2023-02-01 ENCOUNTER — Encounter: Payer: Self-pay | Admitting: Cardiology

## 2023-02-01 ENCOUNTER — Encounter: Payer: Medicare Other | Admitting: Adult Health

## 2023-02-01 NOTE — Progress Notes (Unsigned)
Cardiology Office Note:    Date:  02/02/2023   ID:  Dana Bradford, DOB May 14, 1930, MRN [id number]  PCP:  Clayborne Dana, NP  Cardiologist:  Norman Herrlich, MD    Referring MD: Clayborne Dana, NP    ASSESSMENT:    1. Hypertensive heart and kidney disease with chronic diastolic congestive heart failure and stage 3 chronic kidney disease, unspecified whether stage 3a or 3b CKD (HCC)   2. Permanent atrial fibrillation (HCC)   3. Chronic anticoagulation   4. Venous stasis ulcer, unspecified site, unspecified ulcer stage, unspecified whether varicose veins present (HCC)    PLAN:    In order of problems listed above:  Heart failure is worsened she now has a venous ulcer that is oozing we will increase her diuretics taking her metolazone more frequently along with her furosemide recheck renal function proBNP today Rate is mildly elevated I think due to congestive heart failure continue her current beta-blocker and reduced dose anticoagulant\ Referral Dr. Otilio Saber boot   Next appointment: 3 months   Medication Adjustments/Labs and Tests Ordered: Current medicines are reviewed at length with the patient today.  Concerns regarding medicines are outlined above.  No orders of the defined types were placed in this encounter.  No orders of the defined types were placed in this encounter.    History of Present Illness:    Dana Bradford is a 89 y.o. female with a hx of hypertensive heart disease stage III CKD chronic diastolic heart failure persistent chronic atrial fibrillation with anticoagulation hyperlipidemia and gout last seen 10/13/2022.  Compliance with diet, lifestyle and medications: Unfortunately no fault of her own her weight is up about 5 pounds she has more edema and has a venous ulcer right lower extremity that is recurrent she is supervised and is very compliant with her medications and dietary sodium restriction She is not having a shortness of breath orthopnea  palpitation or chest pain Past Medical History:  Diagnosis Date   Acute CHF (congestive heart failure) (HCC) 04/22/2020   Acute kidney failure (HCC) 2016 to 2017 due to vacomycin   no kidney issues now   Anemia    Anxiety    Arthritis    Atrial fibrillation (HCC)    CHF (congestive heart failure) (HCC) 04/22/2020   CHF exacerbation (HCC) 04/23/2020   Chronic venous insufficiency 03/01/2019   Last Assessment & Plan:   Formatting of this note might be different from the original.  CVI:       Frequent leg elevation above heart level for 30 minutes each session throughout the day     Controlling swelling is lifelong goal to reduce risk of recurrent venous leg ulcers.       Avoid sodium and processed foods to reduce swelling.      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  Relevant Hx:  Course:  Daily Update:  Today's Plan:     Skin cancer    Forehead   Stroke (HCC) 05/2017   ocular stoke on plavix , right eye some vision loss   Venous hypertension of both lower extremities 12/26/2014   Venous stasis dermatitis of left lower extremity 06/05/2018   Venous stasis ulcer (HCC)     Current Medications: Current Meds  Medication Sig   apixaban (ELIQUIS) 2.5 MG TABS tablet Take 1 tablet (2.5 mg total) by mouth 2 (two) times daily.   Cholecalciferol (VITAMIN D3) 50 MCG (2000 UT) TABS Take 2,000 Units by mouth daily.    clotrimazole-betamethasone (LOTRISONE) cream Apply 1 Application topically daily as needed or as directed (Patient taking differently: Apply 1 Application topically daily as needed (rash).)   colchicine 0.6 MG tablet Take 1 tablet (0.6 mg total) by mouth daily.   famotidine (PEPCID) 40 MG tablet TAKE 1 TABLET NIGHTLY AS NEEDED FOR HEARTBURN (Patient taking differently: Take 40 mg by mouth See admin instructions. TAKE 1 TABLET NIGHTLY AS NEEDED FOR HEARTBURN)   ferrous sulfate 325 (65 FE) MG tablet Take 325 mg by mouth 2 (two) times daily with a meal.   furosemide (LASIX) 40 MG tablet Take 1 tablet (40 mg total) by mouth 2 (two) times daily.   levothyroxine (SYNTHROID) 88 MCG tablet Take 1 tablet (88 mcg total) by mouth daily before breakfast.   metFORMIN (GLUCOPHAGE-XR) 500 MG 24 hr tablet TAKE 1 TABLET AT BEDTIME   metolazone (ZAROXOLYN) 2.5 MG tablet Take 1 tablet (2.5 mg total) by mouth once a week. monday's   metoprolol succinate (TOPROL-XL) 50 MG 24 hr tablet TAKE 1 TABLET IN THE MORNING AND ONE-HALF (1/2) TABLET IN THE EVENING DAILY AS DIRECTED (Patient taking differently: Take 50 mg by mouth See admin instructions. 1 TABLET AM AND 1/2 TAB PM)   omeprazole (PRILOSEC) 40 MG capsule Take 1 capsule (40 mg total) by mouth daily.   potassium chloride SA (KLOR-CON M20) 20 MEQ tablet Take 1 tablet (20 mEq total) by mouth daily.   pramipexole (MIRAPEX) 1 MG tablet Take 1 tablet (1 mg total) by mouth every evening.   pravastatin (PRAVACHOL) 20 MG tablet TAKE 1 TABLET DAILY   timolol (TIMOPTIC) 0.5 % ophthalmic solution Place 1 drop into both eyes daily.   triamcinolone ointment (KENALOG) 0.1 % Apply 1 application topically 2 (two) times daily as needed (skin irritation.).   vitamin C (ASCORBIC ACID) 500 MG tablet Take 500 mg by mouth 3 (three) times a week. Monday , Wednesday and Friday      EKGs/Labs/Other Studies Reviewed:    The following studies were reviewed today:  Cardiac Studies &  Procedures      ECHOCARDIOGRAM  ECHOCARDIOGRAM COMPLETE 03/28/2022  Narrative ECHOCARDIOGRAM REPORT    Patient Name:   [facility] Date of Exam: 03/28/2022 Medical Rec #:  [id number]     Height:       58.0 in Accession #:    [id number]    Weight:       146.0 lb Date of Birth:  1930-04-15     BSA:          1.593 m Patient Age:    89 years      BP:           112/62 mmHg Patient Gender: F             HR:  91 bpm. Exam Location:  [place]  Procedure: 2D Echo, Color Doppler and Cardiac Doppler  Indications:    R06.02 SOB; R60.0 Lower extremity edema  History:        Patient has prior history of Echocardiogram examinations, most recent 04/23/2020. CHF, Stroke, Signs/Symptoms:Shortness of Breath and Edema; Risk Factors:Hypertension, Diabetes, CKD stage 3b, Former Smoker and Dyslipidemia. Patient denies chest pain. She does have SOB with bilateral leg edema. Patient is on Eliquis.  Sonographer:    Carlos American RVT, RDCS (AE), RDMS Referring Phys: [phone] Georgeanna Lea   Sonographer Comments: Technically difficult study due to poor echo windows. Image acquisition challenging due to patient body habitus and severe kyphosis. IMPRESSIONS   1. Left ventricular ejection fraction, by estimation, is 55 to 60%. The left ventricle has normal function. The left ventricle has no regional wall motion abnormalities. Left ventricular diastolic parameters are indeterminate. 2. Right ventricular systolic function is normal. The right ventricular size is normal. 3. Left atrial size was severely dilated. 4. The mitral valve is normal in structure. Mild mitral valve regurgitation. No evidence of mitral stenosis. 5. Tricuspid valve regurgitation is mild to moderate. 6. The aortic valve is normal in structure. Aortic valve regurgitation is not visualized. No aortic stenosis is present. 7. The inferior vena cava is normal in size with greater than 50% respiratory variability, suggesting right  atrial pressure of 3 mmHg.  Comparison(s): EF 55%, mild LVH, moderate MAC.  FINDINGS Left Ventricle: Left ventricular ejection fraction, by estimation, is 55 to 60%. The left ventricle has normal function. The left ventricle has no regional wall motion abnormalities. The left ventricular internal cavity size was normal in size. There is no left ventricular hypertrophy. Left ventricular diastolic parameters are indeterminate.  Right Ventricle: The right ventricular size is normal. No increase in right ventricular wall thickness. Right ventricular systolic function is normal.  Left Atrium: Left atrial size was severely dilated.  Right Atrium: Right atrial size was normal in size.  Pericardium: There is no evidence of pericardial effusion.  Mitral Valve: The mitral valve is normal in structure. Mild mitral valve regurgitation. No evidence of mitral valve stenosis.  Tricuspid Valve: The tricuspid valve is normal in structure. Tricuspid valve regurgitation is mild to moderate. No evidence of tricuspid stenosis.  Aortic Valve: The aortic valve is normal in structure. Aortic valve regurgitation is not visualized. No aortic stenosis is present. Aortic valve mean gradient measures 2.0 mmHg. Aortic valve peak gradient measures 4.7 mmHg. Aortic valve area, by VTI measures 1.86 cm.  Pulmonic Valve: The pulmonic valve was normal in structure. Pulmonic valve regurgitation is mild. No evidence of pulmonic stenosis.  Aorta: The aortic root is normal in size and structure.  Venous: The inferior vena cava is normal in size with greater than 50% respiratory variability, suggesting right atrial pressure of 3 mmHg.  IAS/Shunts: No atrial level shunt detected by color flow Doppler.   LEFT VENTRICLE PLAX 2D LVIDd:         3.30 cm   Diastology LVIDs:         2.30 cm   LV e' medial:    5.19 cm/s LV PW:         0.90 cm   LV E/e' medial:  23.1 LV IVS:        1.00 cm   LV e' lateral:   7.87 cm/s LVOT  diam:     1.70 cm   LV E/e' lateral: 15.2 LV SV:  39 LV SV Index:   24 LVOT Area:     2.27 cm   RIGHT VENTRICLE RV S prime:     7.05 cm/s TAPSE (M-mode): 1.6 cm  LEFT ATRIUM              Index        RIGHT ATRIUM           Index LA diam:        4.10 cm  2.57 cm/m   RA Area:     18.60 cm LA Vol (A2C):   93.0 ml  58.38 ml/m  RA Volume:   53.40 ml  33.52 ml/m LA Vol (A4C):   101.0 ml 63.40 ml/m LA Biplane Vol: 96.5 ml  60.57 ml/m AORTIC VALVE                    PULMONIC VALVE AV Area (Vmax):    1.95 cm     PV Vmax:          1.04 m/s AV Area (Vmean):   2.01 cm     PV Peak grad:     4.4 mmHg AV Area (VTI):     1.86 cm     PR End Diast Vel: 3.66 msec AV Vmax:           108.00 cm/s AV Vmean:          71.600 cm/s AV VTI:            0.209 m AV Peak Grad:      4.7 mmHg AV Mean Grad:      2.0 mmHg LVOT Vmax:         92.80 cm/s LVOT Vmean:        63.400 cm/s LVOT VTI:          0.171 m LVOT/AV VTI ratio: 0.82  AORTA Ao Root diam: 3.10 cm Ao Asc diam:  3.30 cm  MITRAL VALVE                TRICUSPID VALVE MV Area (PHT): 7.29 cm     TR Peak grad:   30.5 mmHg MV Decel Time: 104 msec     TR Vmax:        276.00 cm/s MV E velocity: 120.00 cm/s SHUNTS Systemic VTI:  0.17 m Systemic Diam: 1.70 cm  Gypsy Balsam MD Electronically signed by Gypsy Balsam MD Signature Date/Time: 03/29/2022/12:27:17 PM    Final                 Recent Labs: 06/27/2022: ALT 20; Hemoglobin 13.9; NT-Pro BNP 2,696; Platelets 222.0; TSH 0.48 10/21/2022: BUN 27; Creatinine, Ser 1.07; Potassium 3.9; Sodium 140  Recent Lipid Panel    Component Value Date/Time   CHOL 148 06/27/2022 1446   TRIG 116.0 06/27/2022 1446   HDL 53.10 06/27/2022 1446   CHOLHDL 3 06/27/2022 1446   VLDL 23.2 06/27/2022 1446   LDLCALC 71 06/27/2022 1446   LDLDIRECT CANCELED 02/22/2022 1448    Physical Exam:    VS:  BP 120/82 (BP Location: Right Arm, Patient Position: Sitting)   Pulse (!) 110   Ht 4\' 10"   (1.473 m)   Wt 146 lb 6.4 oz (66.4 kg)   SpO2 91%   BMI 30.60 kg/m     Wt Readings from Last 3 Encounters:  02/02/23 146 lb 6.4 oz (66.4 kg)  01/17/23 147 lb 4 oz (66.8 kg)  11/11/22 146 lb (66.2 kg)     GEN:  Well nourished, well developed in no acute distress HEENT: Normal NECK: No JVD; No carotid bruits LYMPHATICS: No lymphadenopathy CARDIAC: Irregular rate and rhythm  RESPIRATORY:  Clear to auscultation without rales, wheezing or rhonchi  ABDOMEN: Soft, non-tender, non-distended MUSCULOSKELETAL: Marked bilateral lower extremity edema edema; No deformity  SKIN: Warm and dry NEUROLOGIC:  Alert and oriented x 3 PSYCHIATRIC:  Normal affect    Signed, Norman Herrlich, MD  02/02/2023 3:45 PM    [facility]

## 2023-02-02 ENCOUNTER — Encounter: Payer: Self-pay | Admitting: Cardiology

## 2023-02-02 ENCOUNTER — Ambulatory Visit: Payer: Medicare Other | Attending: Cardiology | Admitting: Cardiology

## 2023-02-02 VITALS — BP 120/82 | HR 110 | Ht <= 58 in | Wt 146.4 lb

## 2023-02-02 DIAGNOSIS — I130 Hypertensive heart and chronic kidney disease with heart failure and stage 1 through stage 4 chronic kidney disease, or unspecified chronic kidney disease: Secondary | ICD-10-CM | POA: Diagnosis present

## 2023-02-02 DIAGNOSIS — I83009 Varicose veins of unspecified lower extremity with ulcer of unspecified site: Secondary | ICD-10-CM | POA: Insufficient documentation

## 2023-02-02 DIAGNOSIS — L97909 Non-pressure chronic ulcer of unspecified part of unspecified lower leg with unspecified severity: Secondary | ICD-10-CM | POA: Insufficient documentation

## 2023-02-02 DIAGNOSIS — I4821 Permanent atrial fibrillation: Secondary | ICD-10-CM | POA: Diagnosis present

## 2023-02-02 DIAGNOSIS — N183 Chronic kidney disease, stage 3 unspecified: Secondary | ICD-10-CM | POA: Insufficient documentation

## 2023-02-02 DIAGNOSIS — Z7901 Long term (current) use of anticoagulants: Secondary | ICD-10-CM | POA: Diagnosis present

## 2023-02-02 DIAGNOSIS — I5032 Chronic diastolic (congestive) heart failure: Secondary | ICD-10-CM | POA: Insufficient documentation

## 2023-02-02 MED ORDER — METOLAZONE 2.5 MG PO TABS: ORAL_TABLET | ORAL | 3 refills | Status: DC

## 2023-02-02 NOTE — Addendum Note (Signed)
Addended by: Roxanne Mins I on: 02/02/2023 04:26 PM   Modules accepted: Orders

## 2023-02-02 NOTE — Patient Instructions (Signed)
Medication Instructions:  Your physician has recommended you make the following change in your medication:   START: Metalazone 2.5 mg every 5 days. Start taking tomorrow.  *If you need a refill on your cardiac medications before your next appointment, please call your pharmacy*   Lab Work: Your physician recommends that you return for lab work in:   Labs today: BMP, Pro BNP  If you have labs (blood work) drawn today and your tests are completely normal, you will receive your results only by: MyChart Message (if you have MyChart) OR A paper copy in the mail If you have any lab test that is abnormal or we need to change your treatment, we will call you to review the results.   Testing/Procedures: None   Follow-Up: At [facility], you and your health needs are our priority.  As part of our continuing mission to provide you with exceptional heart care, we have created designated Provider Care Teams.  These Care Teams include your primary Cardiologist (physician) and Advanced Practice Providers (APPs -  Physician Assistants and Nurse Practitioners) who all work together to provide you with the care you need, when you need it.  We recommend signing up for the patient portal called "MyChart".  Sign up information is provided on this After Visit Summary.  MyChart is used to connect with patients for Virtual Visits (Telemedicine).  Patients are able to view lab/test results, encounter notes, upcoming appointments, etc.  Non-urgent messages can be sent to your provider as well.   To learn more about what you can do with MyChart, go to [email or web address].    Your next appointment:   3 month(s)  Provider:   Norman Herrlich, MD    Other Instructions None

## 2023-02-03 ENCOUNTER — Inpatient Hospital Stay: Payer: Medicare Other | Attending: Hematology and Oncology | Admitting: Adult Health

## 2023-02-03 ENCOUNTER — Encounter: Payer: Self-pay | Admitting: Cardiology

## 2023-02-03 VITALS — BP 130/89 | HR 105 | Temp 97.7°F | Resp 18 | Wt 145.4 lb

## 2023-02-03 DIAGNOSIS — Z90710 Acquired absence of both cervix and uterus: Secondary | ICD-10-CM | POA: Insufficient documentation

## 2023-02-03 DIAGNOSIS — C50812 Malignant neoplasm of overlapping sites of left female breast: Secondary | ICD-10-CM

## 2023-02-03 DIAGNOSIS — Z1721 Progesterone receptor positive status: Secondary | ICD-10-CM | POA: Insufficient documentation

## 2023-02-03 DIAGNOSIS — I4821 Permanent atrial fibrillation: Secondary | ICD-10-CM

## 2023-02-03 DIAGNOSIS — Z170 Estrogen receptor positive status [ER+]: Secondary | ICD-10-CM | POA: Diagnosis not present

## 2023-02-03 DIAGNOSIS — Z9011 Acquired absence of right breast and nipple: Secondary | ICD-10-CM | POA: Diagnosis not present

## 2023-02-03 DIAGNOSIS — Z1732 Human epidermal growth factor receptor 2 negative status: Secondary | ICD-10-CM | POA: Insufficient documentation

## 2023-02-03 DIAGNOSIS — Z87891 Personal history of nicotine dependence: Secondary | ICD-10-CM | POA: Diagnosis not present

## 2023-02-03 LAB — BASIC METABOLIC PANEL
BUN/Creatinine Ratio: 22 (ref 12–28)
BUN: 24 mg/dL (ref 10–36)
CO2: 27 mmol/L (ref 20–29)
Calcium: 9.2 mg/dL (ref 8.7–10.3)
Chloride: 98 mmol/L (ref 96–106)
Creatinine, Ser: 1.07 mg/dL — ABNORMAL HIGH (ref 0.57–1.00)
Glucose: 100 mg/dL — ABNORMAL HIGH (ref 70–99)
Potassium: 4.1 mmol/L (ref 3.5–5.2)
Sodium: 140 mmol/L (ref 134–144)
eGFR: 49 mL/min/{1.73_m2} — ABNORMAL LOW (ref >59)

## 2023-02-03 LAB — PRO B NATRIURETIC PEPTIDE: NT-Pro BNP: 3695 pg/mL — ABNORMAL HIGH (ref 0–738)

## 2023-02-03 MED ORDER — COLCHICINE 0.6 MG PO TABS: 0.6000 mg | ORAL_TABLET | Freq: Every day | ORAL | 3 refills | Status: DC

## 2023-02-03 MED ORDER — POTASSIUM CHLORIDE CRYS ER 20 MEQ PO TBCR: 20.0000 meq | EXTENDED_RELEASE_TABLET | Freq: Every day | ORAL | 3 refills | Status: DC

## 2023-02-03 MED ORDER — PRAVASTATIN SODIUM 20 MG PO TABS: 20.0000 mg | ORAL_TABLET | Freq: Every day | ORAL | 3 refills | Status: DC

## 2023-02-03 MED ORDER — FUROSEMIDE 40 MG PO TABS: 40.0000 mg | ORAL_TABLET | Freq: Two times a day (BID) | ORAL | 3 refills | Status: DC

## 2023-02-03 MED ORDER — APIXABAN 2.5 MG PO TABS: 2.5000 mg | ORAL_TABLET | Freq: Two times a day (BID) | ORAL | 3 refills | Status: DC

## 2023-02-03 NOTE — Progress Notes (Unsigned)
SURVIVORSHIP VISIT:  BRIEF ONCOLOGIC HISTORY:  Oncology History  Malignant neoplasm of overlapping sites of left female breast (HCC)  08/26/2022 Initial Diagnosis   Painless palpable left breast lump (right mastectomy 50-60 years ago): 1.5 cm mass by ultrasound measured 2 cm, indeterminate 0.5 cm left breast mass (fibroadenoma); biopsy of the 2 cm mass: Grade 2 IDC ER 95%, PR 95%, Ki67 20%, HER2 0 by [facility]    09/26/2022 Cancer Staging   Staging form: Breast, AJCC 8th Edition - Clinical: Stage IA (cT1c, cN0, cM0, G2, ER+, PR+, HER2-) - Signed by Ronny Bacon, PA-C on 09/26/2022 Stage prefix: Initial diagnosis Method of lymph node assessment: Clinical Histologic grading system: 3 grade system   10/27/2022 Surgery   Left lumpectomy: Grade 2 IDC 2.3 cm with high-grade DCIS, margins negative, ER 95%, PR 95%, HER2 0, Ki-67 20%     INTERVAL HISTORY:  Dana Bradford to review her survivorship care plan detailing her treatment course for breast cancer, as well as monitoring long-term side effects of that treatment, education regarding health maintenance, screening, and overall wellness and health promotion.     Overall, Dana Bradford reports feeling quite well   REVIEW OF SYSTEMS:  Review of Systems - Oncology Breast: Denies any new nodularity, masses, tenderness, nipple changes, or nipple discharge.       PAST MEDICAL/SURGICAL HISTORY:  Past Medical History:  Diagnosis Date  . Acute CHF (congestive heart failure) (HCC) 04/22/2020  . Acute kidney failure (HCC) 2016 to 2017 due to vacomycin   no kidney issues now  . Anemia   . Anxiety   . Arthritis   . Atrial fibrillation (HCC)   . CHF (congestive heart failure) (HCC) 04/22/2020  . CHF exacerbation (HCC) 04/23/2020  . Chronic venous insufficiency 03/01/2019   Last Assessment & Plan:   Formatting of this note might be different from the original.  CVI:       Frequent leg elevation above heart level for 30 minutes each session throughout the  day     Controlling swelling is lifelong goal to reduce risk of recurrent venous leg ulcers.       Avoid sodium and processed foods to reduce swelling.      Lifelong compression advised once RLE dermatitis is resolve  . Depression   . Diabetes mellitus without complication (HCC)   . Dyspnea   . Essential hypertension, benign 06/17/2012  . Food allergy    Fish (Hives)   . Glaucoma   . 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 Hypertension   . Hypotension 01/19/2020  . Impaired fasting glucose   . Other specified acquired hypothyroidism   . Permanent atrial fibrillation (HCC)   . Pneumonia   . Restless leg   . Restless leg   . Retinal artery branch occlusion of right eye 06/12/2017   Last Assessment & Plan:   Formatting of this note might be different from the original.  Relevant Hx:  Course:  Daily Update:  Today's Plan:    . Skin cancer    Forehead  . Stroke (HCC) 05/2017   ocular stoke on plavix , right eye some vision loss  . Venous hypertension of both lower extremities 12/26/2014  . Venous stasis dermatitis of left lower extremity 06/05/2018  . Venous stasis ulcer (HCC)    Past Surgical History:  Procedure Laterality Date  . ABDOMINAL HYSTERECTOMY  1993   Uterine Prolapse partial  . BREAST BIOPSY Left 08/26/2022   MM LT BREAST BX W LOC  DEV 1ST LESION IMAGE BX SPEC STEREO GUIDE 08/26/2022 [facility]  . BREAST BIOPSY Left 08/26/2022   [place] LT BREAST BX W LOC DEV 1ST LESION IMG BX SPEC US GUIDE 08/26/2022 [facility]  . BREAST BIOPSY  10/25/2022   MM LT RADIOACTIVE SEED LOC MAMMO GUIDE 10/25/2022 [facility]  . BREAST LUMPECTOMY WITH RADIOACTIVE SEED LOCALIZATION Left 10/27/2022   Procedure: LEFT BREAST LUMPECTOMY WITH RADIOACTIVE SEED LOCALIZATION;  Surgeon: Abigail Miyamoto, MD;  Location: [facility];  Service: General;  Laterality: Left;  LMA  . MASTECTOMY  1974   right breast not sure if cancer 1960's  . REPLACEMENT TOTAL KNEE BILATERAL Bilateral    . SMALL TOE RIGHT FOOT REMOVED  04/2017   DUE TO INFECTION  . Spleenectomy  2000  . TOTAL HIP ARTHROPLASTY Right 02/28/2018   Procedure: RIGHT TOTAL HIP ARTHROPLASTY ANTERIOR APPROACH;  Surgeon: Ollen Gross, MD;  Location: [facility];  Service: Orthopedics;  Laterality: Right;   . TOTAL HIP ARTHROPLASTY Left 01/22/2020   Procedure: TOTAL HIP ARTHROPLASTY ANTERIOR APPROACH;  Surgeon: Ollen Gross, MD;  Location: [facility];  Service: Orthopedics;  Laterality: Left;   . TOTAL KNEE ARTHROPLASTY WITH REVISION COMPONENTS       ALLERGIES:  Allergies  Allergen Reactions  . Arthrotec [Diclofenac-Misoprostol] Shortness Of Breath and Diarrhea  . Doxycycline Nausea And Vomiting and Rash    Significant enough to d/c treatment and refuse to take more Vomiting (intolerance) The first time she took it she had N/V and the second she had a rash.     . Latex Rash and Other (See Comments)  . Amlodipine     angioedema  . Celebrex [Celecoxib] Other (See Comments)    GI Bleeding  . Vancomycin     Acute kidney failure  . Diclofenac Diarrhea  . Shellfish Allergy Itching and Rash     CURRENT MEDICATIONS:  Outpatient Encounter Medications as of 02/03/2023  Medication Sig  . apixaban (ELIQUIS) 2.5 MG TABS tablet Take 1 tablet (2.5 mg total) by mouth 2 (two) times daily.  . Cholecalciferol (VITAMIN D3) 50 MCG (2000 UT) TABS Take 2,000 Units by mouth daily.  . clotrimazole-betamethasone (LOTRISONE) cream Apply 1 Application topically daily as needed or as directed (Patient taking differently: Apply 1 Application topically daily as needed (rash).)  . colchicine 0.6 MG tablet Take 1 tablet (0.6 mg total) by mouth daily.  . famotidine (PEPCID) 40 MG tablet TAKE 1 TABLET NIGHTLY AS NEEDED FOR HEARTBURN (Patient taking differently: Take 40 mg by mouth See admin instructions. TAKE 1 TABLET NIGHTLY AS NEEDED FOR HEARTBURN)  . ferrous sulfate 325 (65 FE) MG tablet Take 325 mg by mouth 2 (two) times  daily with a meal.  . furosemide (LASIX) 40 MG tablet Take 1 tablet (40 mg total) by mouth 2 (two) times daily.  Marland Kitchen levothyroxine (SYNTHROID) 88 MCG tablet Take 1 tablet (88 mcg total) by mouth daily before breakfast.  . metFORMIN (GLUCOPHAGE-XR) 500 MG 24 hr tablet TAKE 1 TABLET AT BEDTIME  . metolazone (ZAROXOLYN) 2.5 MG tablet Please take this medication every 5 days. Start taking tomorrow.  . metoprolol succinate (TOPROL-XL) 50 MG 24 hr tablet TAKE 1 TABLET IN THE MORNING AND ONE-HALF (1/2) TABLET IN THE EVENING DAILY AS DIRECTED (Patient taking differently: Take 50 mg by mouth See admin instructions. 1 TABLET AM AND 1/2 TAB PM)  . omeprazole (PRILOSEC) 40 MG capsule Take 1 capsule (40 mg total) by mouth daily.  . potassium  chloride SA (KLOR-CON M20) 20 MEQ tablet Take 1 tablet (20 mEq total) by mouth daily.  . pramipexole (MIRAPEX) 1 MG tablet Take 1 tablet (1 mg total) by mouth every evening.  . pravastatin (PRAVACHOL) 20 MG tablet TAKE 1 TABLET DAILY  . timolol (TIMOPTIC) 0.5 % ophthalmic solution Place 1 drop into both eyes daily.  Marland Kitchen triamcinolone ointment (KENALOG) 0.1 % Apply 1 application topically 2 (two) times daily as needed (skin irritation.).  Marland Kitchen vitamin C (ASCORBIC ACID) 500 MG tablet Take 500 mg by mouth 3 (three) times a week. Monday , Wednesday and Friday  . [DISCONTINUED] metolazone (ZAROXOLYN) 2.5 MG tablet Take 1 tablet (2.5 mg total) by mouth once a week. monday's   No facility-administered encounter medications on file as of 02/03/2023.     ONCOLOGIC FAMILY HISTORY:  Family History  Problem Relation Age of Onset  . Cancer Mother   . Heart disease Father   . Hyperlipidemia Father   . Aneurysm Sister   . Kidney disease Brother      SOCIAL HISTORY:  Social History   Socioeconomic History  . Marital status: Widowed    Spouse name: Not on file  . Number of children: 2  . Years of education: 57  . Highest education level: Not on file  Occupational History  .  Occupation: Retired   Tobacco Use  . Smoking status: Former    Current packs/day: 0.00    Average packs/day: 1 pack/day for 30.0 years (30.0 ttl pk-yrs)    Types: Cigarettes    Start date: 89    Quit date: 85    Years since quitting: 50.9    Passive exposure: Past  . Smokeless tobacco: Never  . Tobacco comments:    qiot 50 yrs ago  Vaping Use  . Vaping status: Never Used  Substance and Sexual Activity  . Alcohol use: Not Currently  . Drug use: No  . Sexual activity: Not Currently    Birth control/protection: Surgical    Comment: Hysterectomy  Other Topics Concern  . Not on file  Social History Narrative   Marital Status: Widowed    Children:  Daughter Skipper Cliche) Son Scarlettrose [place])   Pets: None    Living Situation: Lives alone    Occupation: Retired    Education: Engineer, agricultural    Tobacco:  She quit smoking 35 years ago after having smoked 1 ppd for 20 years.      Alcohol Use:  One glass of wine twice a week     Drug Use:  None   Diet:  Regular   Exercise:  None   Hobbies: Crafting, Gardening.             Social Drivers of Corporate investment banker Strain: Low Risk  (07/20/2022)   Received from [facility], [facility]   Overall Financial Resource Strain (CARDIA)   . Difficulty of Paying Living Expenses: Not hard at all  Food Insecurity: No Food Insecurity (07/20/2022)   Received from [facility], [facility]   Hunger Vital Sign   . Worried About Programme researcher, broadcasting/film/video in the Last Year: Never true   . Ran Out of Food in the Last Year: Never true  Transportation Needs: No Transportation Needs (07/20/2022)   Received from [facility], [facility]   [facility] - Transportation   . Lack of Transportation (Medical): No   . Lack of Transportation (Non-Medical): No  Physical Activity: Unknown (09/14/2021)   Received from  [facility] visits prior to 04/23/2022.   Exercise Vital Sign   . Days of Exercise per Week: Patient refused    . Minutes of Exercise per Session: Not on file  Stress: Patient Declined (09/14/2021)   [facility] from [facility], [facility] visits prior to 04/23/2022., [facility] visits prior to 04/23/2022.   [employer] of Occupational Health - Occupational Stress Questionnaire   . Feeling of Stress : Patient declined  Social Connections: Unknown (01/17/2023)   Social Connection and Isolation Panel [NHANES]   . Frequency of Communication with Friends and Family: Not on file   . Frequency of Social Gatherings with Friends and Family: Not on file   . Attends Religious Services: Not on file   . Active Member of Clubs or Organizations: Not on file   . Attends Banker Meetings: Not on file   . Marital Status: Widowed  Intimate Partner Violence: Unknown (06/23/2021)   Received from [facility] visits prior to 04/23/2022., [facility] visits prior to 04/23/2022.   Humiliation, Afraid, Rape, and Kick questionnaire   . Fear of Current or Ex-Partner: Patient refused   . Emotionally Abused: Patient refused   . Physically Abused: Patient refused   . Sexually Abused: Patient refused     OBSERVATIONS/OBJECTIVE:  There were no vitals taken for this visit. GENERAL: Patient is a well appearing female in no acute distress HEENT:  Sclerae anicteric.  Oropharynx clear and moist. No ulcerations or evidence of oropharyngeal candidiasis. Neck is supple.  NODES:  No cervical, supraclavicular, or axillary lymphadenopathy palpated.  BREAST EXAM:  Deferred. LUNGS:  Clear to auscultation bilaterally.  No wheezes or rhonchi. HEART:  Regular rate and rhythm. No murmur appreciated. ABDOMEN:  Soft, nontender.  Positive, normoactive bowel sounds. No organomegaly palpated. MSK:  No focal spinal tenderness to palpation. Full range of motion bilaterally in the upper extremities. EXTREMITIES:  No peripheral edema.    SKIN:  Clear with no obvious rashes or skin changes. No nail dyscrasia. NEURO:  Nonfocal. Well oriented.  Appropriate affect.   LABORATORY DATA:  None for this visit.  DIAGNOSTIC IMAGING:  None for this visit.      ASSESSMENT AND PLAN:  Ms.. Bradford is a pleasant 89 y.o. female with Stage *** right/left breast invasive ductal carcinoma, ER+/PR+/HER2-, diagnosed in ***, treated with lumpectomy, adjuvant radiation therapy, and anti-estrogen therapy with *** beginning in ***.  She presents to the Survivorship Clinic for our initial meeting and routine follow-up post-completion of treatment for breast cancer.    1. Stage *** right/left breast cancer:  Dana Bradford is continuing to recover from definitive treatment for breast cancer. She will follow-up with her medical oncologist, Dr.  Marland Kitchen with history and physical exam per surveillance protocol.  She will continue her anti-estrogen therapy with ***. Thus far, she is tolerating the *** well, with minimal side effects. Her mammogram is due ***; orders placed today.   Today, a comprehensive survivorship care plan and treatment summary was reviewed with the patient today detailing her breast cancer diagnosis, treatment course, potential late/long-term effects of treatment, appropriate follow-up care with recommendations for the future, and patient education resources.  A copy of this summary, along with a letter will be sent to the patient's primary care provider via mail/fax/In Basket message after today's visit.    #. Problem(s) at Visit______________  #. Bone health:  Given Dana Bradford's  age/history of breast cancer and her current treatment regimen including anti-estrogen therapy with ***, she is at risk for bone demineralization.  Her last DEXA scan was ***, which showed ***.  In the meantime, she was encouraged to increase her consumption of foods rich in calcium, as well as increase her weight-bearing activities.  She was given education on specific  activities to promote bone health.  #. Cancer screening:  Due to Dana Bradford history and her age, she should receive screening for skin cancers, colon cancer, and gynecologic cancers.  The information and recommendations are listed on the patient's comprehensive care plan/treatment summary and were reviewed in detail with the patient.    #. Health maintenance and wellness promotion: Dana Bradford was encouraged to consume 5-7 servings of fruits and vegetables per day. We reviewed the "Nutrition Rainbow" handout.  She was also encouraged to engage in moderate to vigorous exercise for 30 minutes per day most days of the week.  She was instructed to limit her alcohol consumption and continue to abstain from tobacco use/***was encouraged stop smoking.     #. Support services/counseling: It is not uncommon for this period of the patient's cancer care trajectory to be one of many emotions and stressors.   She was given information regarding our available services and encouraged to contact me with any questions or for help enrolling in any of our support group/programs.    Follow up instructions:    -Return to cancer center ***  -Mammogram due in *** -She is welcome to return back to the Survivorship Clinic at any time; no additional follow-up needed at this time.  -Consider referral back to survivorship as a long-term survivor for continued surveillance  The patient was provided an opportunity to ask questions and all were answered. The patient agreed with the plan and demonstrated an understanding of the instructions.   Total encounter time:*** minutes*in face-to-face visit time, chart review, lab review, care coordination, order entry, and documentation of the encounter time.    Lillard Anes, NP 02/03/23 2:26 PM Medical Oncology and Hematology [facility] [address] [place], [place] [address] Tel. [phone]    Fax. [phone]  *Total Encounter Time as defined by the  Centers for Medicare and Medicaid Services includes, in addition to the face-to-face time of a patient visit (documented in the note above) non-face-to-face time: obtaining and reviewing outside history, ordering and reviewing medications, tests or procedures, care coordination (communications with other health care professionals or caregivers) and documentation in the medical record.

## 2023-02-03 NOTE — Addendum Note (Signed)
Addended by: Roxanne Mins I on: 02/03/2023 03:07 PM   Modules accepted: Orders

## 2023-02-16 MED ORDER — APIXABAN 5 MG PO TABS: ORAL_TABLET | ORAL | 1 refills | Status: DC

## 2023-02-16 NOTE — Telephone Encounter (Signed)
Prescription refill request for Eliquis received. Indication: Afib  Last office visit: 02/02/23 [facility])  Scr: 1.70 (02/02/23)  Age: 89 Weight: 66kg  Per dosing criteria, pt's dose should be 2.5mg  BID. Pt requesting 5mg  tablet and will cut in half (2.5mg ). Reviewed with Laural Golden, PharmD. Refill sent.

## 2023-03-01 ENCOUNTER — Other Ambulatory Visit (HOSPITAL_BASED_OUTPATIENT_CLINIC_OR_DEPARTMENT_OTHER): Payer: Self-pay

## 2023-03-01 MED ORDER — FLUCONAZOLE 150 MG PO TABS
150.0000 mg | ORAL_TABLET | Freq: Every day | ORAL | 0 refills | Status: DC
  Filled 2023-03-01: qty 5, 5d supply, fill #0

## 2023-03-08 ENCOUNTER — Other Ambulatory Visit (HOSPITAL_BASED_OUTPATIENT_CLINIC_OR_DEPARTMENT_OTHER): Payer: Self-pay

## 2023-03-08 MED ORDER — SULFAMETHOXAZOLE-TRIMETHOPRIM 800-160 MG PO TABS
1.0000 | ORAL_TABLET | Freq: Two times a day (BID) | ORAL | 0 refills | Status: DC
  Filled 2023-03-08: qty 10, 5d supply, fill #0

## 2023-03-24 ENCOUNTER — Other Ambulatory Visit: Payer: Self-pay | Admitting: Family Medicine

## 2023-03-24 DIAGNOSIS — K219 Gastro-esophageal reflux disease without esophagitis: Secondary | ICD-10-CM

## 2023-03-31 ENCOUNTER — Other Ambulatory Visit: Payer: Self-pay | Admitting: Family Medicine

## 2023-03-31 DIAGNOSIS — K219 Gastro-esophageal reflux disease without esophagitis: Secondary | ICD-10-CM

## 2023-04-20 ENCOUNTER — Other Ambulatory Visit: Payer: Self-pay | Admitting: Family Medicine

## 2023-04-20 DIAGNOSIS — G2581 Restless legs syndrome: Secondary | ICD-10-CM

## 2023-05-01 ENCOUNTER — Encounter: Payer: Self-pay | Admitting: Cardiology

## 2023-05-02 NOTE — Progress Notes (Unsigned)
 Cardiology Office Note:    Date:  05/03/2023   ID:  Bradford Bradford, DOB 08/27/1930, MRN [id number]  PCP:  Bradford Dana, NP  Cardiologist:  Bradford Herrlich, MD    Referring MD: Bradford Dana, NP    ASSESSMENT:    1. Permanent atrial fibrillation (HCC)   2. Chronic anticoagulation   3. Hypertensive heart and kidney disease with chronic diastolic congestive heart failure and stage 3 chronic kidney disease, unspecified whether stage 3a or 3b CKD (HCC)   4. Venous stasis ulcer, unspecified site, unspecified ulcer stage, unspecified whether varicose veins present (HCC)   5. Mixed hyperlipidemia    PLAN:    In order of problems listed above:  Doing better rate is controlled she has a smart watch she has had no high or low rate alerts she will continue her beta-blocker and anticoagulant Eliquis he does not fulfill criteria for ABC dose reduction Blood pressure controlled heart failure compensated with her current loop diuretic has not needed to take Zaroxolyn recheck her renal function proBNP Continue her statin I will check a lipid profile Improved venous ulcer is healed   Next appointment: 4 months   Medication Adjustments/Labs and Tests Ordered: Current medicines are reviewed at length with the patient today.  Concerns regarding medicines are outlined above.  No orders of the defined types were placed in this encounter.  No orders of the defined types were placed in this encounter.    History of Present Illness:    Bradford Bradford is a 89 y.o. female with a hx of hypertensive heart disease with stage III CKD and chronic diastolic heart failure chronic atrial fibrillation with anticoagulation hyperlipidemia and gout last seen 2023.  She also has venous stasis ulcer being treated locally and referred to Dr. Otilio Bradford boot.  Compliance with diet, lifestyle and medications: Yes  Her venous ulcers healed with Unna boot She is doing well not having edema shortness of breath  orthopnea chest pain palpitation or syncope She sleeps with the head of the bed elevated No bleeding from her anticoagulant To rates her statin without muscle pain or weakness Past Medical History:  Diagnosis Date   Acute CHF (congestive heart failure) (HCC) 04/22/2020   Acute kidney failure (HCC) 2016 to 2017 due to vacomycin   no kidney issues now   Anemia    Antibiotic-induced nephrotoxicity 05/26/2020   Anxiety    ARF (acute renal failure) (HCC) 04/22/2020   Arthritis    Atrial fibrillation (HCC)    CHF (congestive heart failure) (HCC) 04/22/2020   CHF exacerbation (HCC) 04/23/2020   Chronic venous insufficiency 03/01/2019   Last Assessment & Plan:   Formatting of this note might be different from the original.  CVI:       Frequent leg elevation above heart level for 30 minutes each session throughout the day     Controlling swelling is lifelong goal to reduce risk of recurrent venous leg ulcers.       Avoid sodium and processed foods to reduce swelling.      Lifelong compression advised once RLE dermatitis is resolve   Controlled type 2 diabetes mellitus with hyperglycemia, without long-term current use of insulin (HCC) 10/18/2017   Degeneration of lumbar intervertebral disc 12/02/2019   Degenerative spondylolisthesis 12/02/2019   Depression    Diabetes mellitus without complication (HCC)    Diabetic ulcer of toe of right foot associated with type 2 diabetes mellitus, with fat layer exposed (HCC) 02/08/2019   Last  Assessment & Plan:   Formatting of this note is different from the original.  Patient tolerated selective debridement as above. Left dorsum foot ulcer making good progress toward closure.      According to recent blood flow studys patient bloodflow is adequate for healing, bilaterally.      We have reinforced purpose and importance of complete offloading and glycemic control.     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 macular degeneration of left eye 10/18/2017   Left leg cellulitis 01/18/2020   Lymphedema of both lower extremities 12/26/2014   Malignant neoplasm of overlapping sites of left female breast (HCC) 09/26/2022   OA (osteoarthritis) of hip 02/28/2018   Osteopenia 08/26/2021   Osteoporosis 09/16/2021   Other specified acquired hypothyroidism    Pain in joint of right hip 02/06/2018   Pain in left knee 11/20/2019   Permanent atrial fibrillation (HCC)    Pneumonia    Post-menopause 08/26/2021   Primary open angle glaucoma (POAG) of both eyes, indeterminate stage 10/18/2017   Restless leg    Restless leg    Restless leg syndrome 06/05/2012   Retinal artery branch occlusion of right eye 06/12/2017   Last Assessment & Plan:   Formatting of this note might be different from the original.  Relevant Hx:  Course:  Daily Update:  Today's Plan:     Right ankle pain 08/26/2021   Scoliosis deformity of spine 12/02/2019   Skin cancer    Forehead   Stage 3b chronic kidney disease (HCC) 02/23/2021   Status post amputation of lesser toe of right foot (HCC) 05/13/2017   Formatting of this note might be different from the original. 05/13/17 Op note-Mrs. Bradford Bradford is a very pleasant 89y/o female who presented to the ED with infection to her right foot. Xrays and MRI concerning for acute osteomyelitis of right 5th toe.Proceed with amputation of right 5th toe to help ensure quick resolution of infection to help prevent  progression.     Stroke (HCC) 05/2017   ocular stoke on plavix , right eye some vision loss   Vaginal vault prolapse 08/12/2014   Venous hypertension of both lower extremities 12/26/2014   Venous stasis dermatitis of left lower extremity 06/05/2018   Venous stasis ulcer (HCC)     Current Medications: Current Meds  Medication Sig   apixaban (ELIQUIS) 5 MG TABS tablet Take 1/2 tablet (2.5mg ) by mouth two times daily   Cholecalciferol (VITAMIN D3) 50 MCG (2000 UT) TABS Take 2,000 Units by mouth daily.   clotrimazole-betamethasone (LOTRISONE) cream Apply 1 Application topically daily as needed or as directed (Patient taking differently: Apply 1 Application topically daily as needed (rash).)   colchicine 0.6 MG tablet Take 1 tablet (0.6 mg total) by mouth daily.   famotidine (PEPCID) 40 MG tablet TAKE 1 TABLET NIGHTLY AS NEEDED FOR HEARTBURN   ferrous sulfate 325 (65 FE) MG tablet Take 325 mg by mouth 2 (two) times  daily with a meal.   furosemide (LASIX) 40 MG tablet Take 1 tablet (40 mg total) by mouth 2 (two) times daily.   levothyroxine (SYNTHROID) 88 MCG tablet Take 1 tablet (88 mcg total) by mouth daily before breakfast.   metFORMIN (GLUCOPHAGE-XR) 500 MG 24 hr tablet TAKE 1 TABLET AT BEDTIME   metolazone (ZAROXOLYN) 2.5 MG tablet Please take this medication every 5 days. Start taking tomorrow.   metoprolol succinate (TOPROL-XL) 50 MG 24 hr tablet TAKE 1 TABLET IN THE MORNING AND ONE-HALF (1/2) TABLET IN THE EVENING DAILY AS DIRECTED (Patient taking differently: Take 50 mg by mouth See admin instructions. 1 TABLET AM AND 1/2 TAB PM)   omeprazole (PRILOSEC) 40 MG capsule TAKE 1 CAPSULE DAILY   potassium chloride SA (KLOR-CON M20) 20 MEQ tablet Take 1 tablet (20 mEq total) by mouth daily.   pramipexole (MIRAPEX) 1 MG tablet Take 1 tablet (1 mg total) by mouth every evening. Needs appt for further refills   pravastatin (PRAVACHOL) 20 MG tablet Take 1 tablet (20 mg total) by mouth daily.    sulfamethoxazole-trimethoprim (BACTRIM DS) 800-160 MG tablet Take 1 tablet by mouth twice a day   timolol (TIMOPTIC) 0.5 % ophthalmic solution Place 1 drop into both eyes daily.   triamcinolone ointment (KENALOG) 0.1 % Apply 1 application topically 2 (two) times daily as needed (skin irritation.).   vitamin C (ASCORBIC ACID) 500 MG tablet Take 500 mg by mouth 3 (three) times a week. Monday , Wednesday and Friday      EKGs/Labs/Other Studies Reviewed:    The following studies were reviewed today:  Cardiac Studies & Procedures   ______________________________________________________________________________________________     ECHOCARDIOGRAM  ECHOCARDIOGRAM COMPLETE 03/28/2022  Narrative ECHOCARDIOGRAM REPORT    Patient Name:   [facility] Date of Exam: 03/28/2022 Medical Rec #:  [id number]     Height:       58.0 in Accession #:    [id number]    Weight:       146.0 lb Date of Birth:  Jun 08, 1930     BSA:          1.593 m Patient Age:    89 years      BP:           112/62 mmHg Patient Gender: F             HR:           91 bpm. Exam Location:  [place]  Procedure: 2D Echo, Color Doppler and Cardiac Doppler  Indications:    R06.02 SOB; R60.0 Lower extremity edema  History:        Patient has prior history of Echocardiogram examinations, most recent 04/23/2020. CHF, Stroke, Signs/Symptoms:Shortness of Breath and Edema; Risk Factors:Hypertension, Diabetes, CKD stage 3b, Former Smoker and Dyslipidemia. Patient denies chest pain. She does have SOB with bilateral leg edema. Patient is on Eliquis.  Sonographer:    Carlos American RVT, RDCS (AE), RDMS Referring Phys: [phone] Georgeanna Lea   Sonographer Comments: Technically difficult study due to poor echo windows. Image acquisition challenging due to patient body habitus and severe kyphosis. IMPRESSIONS   1. Left ventricular ejection fraction, by estimation, is 55 to 60%. The left ventricle has normal function. The left  ventricle has no regional wall motion abnormalities. Left ventricular diastolic parameters are indeterminate. 2. Right ventricular systolic function is normal. The right ventricular size is normal. 3. Left atrial size was severely dilated. 4. The mitral valve is normal in  structure. Mild mitral valve regurgitation. No evidence of mitral stenosis. 5. Tricuspid valve regurgitation is mild to moderate. 6. The aortic valve is normal in structure. Aortic valve regurgitation is not visualized. No aortic stenosis is present. 7. The inferior vena cava is normal in size with greater than 50% respiratory variability, suggesting right atrial pressure of 3 mmHg.  Comparison(s): EF 55%, mild LVH, moderate MAC.  FINDINGS Left Ventricle: Left ventricular ejection fraction, by estimation, is 55 to 60%. The left ventricle has normal function. The left ventricle has no regional wall motion abnormalities. The left ventricular internal cavity size was normal in size. There is no left ventricular hypertrophy. Left ventricular diastolic parameters are indeterminate.  Right Ventricle: The right ventricular size is normal. No increase in right ventricular wall thickness. Right ventricular systolic function is normal.  Left Atrium: Left atrial size was severely dilated.  Right Atrium: Right atrial size was normal in size.  Pericardium: There is no evidence of pericardial effusion.  Mitral Valve: The mitral valve is normal in structure. Mild mitral valve regurgitation. No evidence of mitral valve stenosis.  Tricuspid Valve: The tricuspid valve is normal in structure. Tricuspid valve regurgitation is mild to moderate. No evidence of tricuspid stenosis.  Aortic Valve: The aortic valve is normal in structure. Aortic valve regurgitation is not visualized. No aortic stenosis is present. Aortic valve mean gradient measures 2.0 mmHg. Aortic valve peak gradient measures 4.7 mmHg. Aortic valve area, by VTI measures 1.86  cm.  Pulmonic Valve: The pulmonic valve was normal in structure. Pulmonic valve regurgitation is mild. No evidence of pulmonic stenosis.  Aorta: The aortic root is normal in size and structure.  Venous: The inferior vena cava is normal in size with greater than 50% respiratory variability, suggesting right atrial pressure of 3 mmHg.  IAS/Shunts: No atrial level shunt detected by color flow Doppler.   LEFT VENTRICLE PLAX 2D LVIDd:         3.30 cm   Diastology LVIDs:         2.30 cm   LV e' medial:    5.19 cm/s LV PW:         0.90 cm   LV E/e' medial:  23.1 LV IVS:        1.00 cm   LV e' lateral:   7.87 cm/s LVOT diam:     1.70 cm   LV E/e' lateral: 15.2 LV SV:         39 LV SV Index:   24 LVOT Area:     2.27 cm   RIGHT VENTRICLE RV S prime:     7.05 cm/s TAPSE (M-mode): 1.6 cm  LEFT ATRIUM              Index        RIGHT ATRIUM           Index LA diam:        4.10 cm  2.57 cm/m   RA Area:     18.60 cm LA Vol (A2C):   93.0 ml  58.38 ml/m  RA Volume:   53.40 ml  33.52 ml/m LA Vol (A4C):   101.0 ml 63.40 ml/m LA Biplane Vol: 96.5 ml  60.57 ml/m AORTIC VALVE                    PULMONIC VALVE AV Area (Vmax):    1.95 cm     PV Vmax:          1.04  m/s AV Area (Vmean):   2.01 cm     PV Peak grad:     4.4 mmHg AV Area (VTI):     1.86 cm     PR End Diast Vel: 3.66 msec AV Vmax:           108.00 cm/s AV Vmean:          71.600 cm/s AV VTI:            0.209 m AV Peak Grad:      4.7 mmHg AV Mean Grad:      2.0 mmHg LVOT Vmax:         92.80 cm/s LVOT Vmean:        63.400 cm/s LVOT VTI:          0.171 m LVOT/AV VTI ratio: 0.82  AORTA Ao Root diam: 3.10 cm Ao Asc diam:  3.30 cm  MITRAL VALVE                TRICUSPID VALVE MV Area (PHT): 7.29 cm     TR Peak grad:   30.5 mmHg MV Decel Time: 104 msec     TR Vmax:        276.00 cm/s MV E velocity: 120.00 cm/s SHUNTS Systemic VTI:  0.17 m Systemic Diam: 1.70 cm  Gypsy Balsam MD Electronically signed by Gypsy Balsam MD Signature Date/Time: 03/29/2022/12:27:17 PM    Final          ______________________________________________________________________________________________          Recent Labs: 06/27/2022: ALT 20; Hemoglobin 13.9; Platelets 222.0; TSH 0.48 02/02/2023: BUN 24; Creatinine, Ser 1.07; NT-Pro BNP 3,695; Potassium 4.1; Sodium 140  Recent Lipid Panel    Component Value Date/Time   CHOL 148 06/27/2022 1446   TRIG 116.0 06/27/2022 1446   HDL 53.10 06/27/2022 1446   CHOLHDL 3 06/27/2022 1446   VLDL 23.2 06/27/2022 1446   LDLCALC 71 06/27/2022 1446   LDLDIRECT CANCELED 02/22/2022 1448    Physical Exam:    VS:  BP 114/72   Pulse 88   Ht 4\' 10"  (1.473 m)   Wt 146 lb 12.8 oz (66.6 kg)   SpO2 96%   BMI 30.68 kg/m     Wt Readings from Last 3 Encounters:  05/03/23 146 lb 12.8 oz (66.6 kg)  02/03/23 145 lb 6.4 oz (66 kg)  02/02/23 146 lb 6.4 oz (66.4 kg)     GEN: She looks her age well nourished, well developed in no acute distress HEENT: Normal NECK: No JVD; No carotid bruits LYMPHATICS: No lymphadenopathy CARDIAC: Irregular rate and rhythm variable first heart sound RESPIRATORY:  Clear to auscultation without rales, wheezing or rhonchi  ABDOMEN: Soft, non-tender, non-distended MUSCULOSKELETAL:  No edema; No deformity  SKIN: Warm and dry NEUROLOGIC:  Alert and oriented x 3 PSYCHIATRIC:  Normal affect    Signed, Bradford Herrlich, MD  05/03/2023 3:51 PM    [facility]

## 2023-05-03 ENCOUNTER — Encounter: Payer: Self-pay | Admitting: Cardiology

## 2023-05-03 ENCOUNTER — Ambulatory Visit: Payer: Medicare Other | Attending: Cardiology | Admitting: Cardiology

## 2023-05-03 VITALS — BP 114/72 | HR 88 | Ht <= 58 in | Wt 146.8 lb

## 2023-05-03 DIAGNOSIS — Z7901 Long term (current) use of anticoagulants: Secondary | ICD-10-CM

## 2023-05-03 DIAGNOSIS — E782 Mixed hyperlipidemia: Secondary | ICD-10-CM

## 2023-05-03 DIAGNOSIS — I4821 Permanent atrial fibrillation: Secondary | ICD-10-CM

## 2023-05-03 DIAGNOSIS — I130 Hypertensive heart and chronic kidney disease with heart failure and stage 1 through stage 4 chronic kidney disease, or unspecified chronic kidney disease: Secondary | ICD-10-CM | POA: Diagnosis present

## 2023-05-03 DIAGNOSIS — I83009 Varicose veins of unspecified lower extremity with ulcer of unspecified site: Secondary | ICD-10-CM | POA: Diagnosis present

## 2023-05-03 DIAGNOSIS — L97909 Non-pressure chronic ulcer of unspecified part of unspecified lower leg with unspecified severity: Secondary | ICD-10-CM | POA: Diagnosis present

## 2023-05-03 DIAGNOSIS — N183 Chronic kidney disease, stage 3 unspecified: Secondary | ICD-10-CM | POA: Diagnosis present

## 2023-05-03 DIAGNOSIS — I5032 Chronic diastolic (congestive) heart failure: Secondary | ICD-10-CM

## 2023-05-03 MED ORDER — APIXABAN 5 MG PO TABS: ORAL_TABLET | ORAL | 0 refills | Status: DC

## 2023-05-03 MED ORDER — METOPROLOL SUCCINATE ER 50 MG PO TB24
50.0000 mg | ORAL_TABLET | ORAL | 3 refills | Status: DC
Start: 2023-05-03 — End: 2023-12-28

## 2023-05-03 NOTE — Patient Instructions (Signed)
 Medication Instructions:  Your physician recommends that you continue on your current medications as directed. Please refer to the Current Medication list given to you today.  *If you need a refill on your cardiac medications before your next appointment, please call your pharmacy*   Lab Work: Your physician recommends that you return for lab work in:   Labs today: CMP, Lipids, Pro BNP  If you have labs (blood work) drawn today and your tests are completely normal, you will receive your results only by: MyChart Message (if you have MyChart) OR A paper copy in the mail If you have any lab test that is abnormal or we need to change your treatment, we will call you to review the results.   Testing/Procedures: None   Follow-Up: At [facility], you and your health needs are our priority.  As part of our continuing mission to provide you with exceptional heart care, we have created designated Provider Care Teams.  These Care Teams include your primary Cardiologist (physician) and Advanced Practice Providers (APPs -  Physician Assistants and Nurse Practitioners) who all work together to provide you with the care you need, when you need it.  We recommend signing up for the patient portal called "MyChart".  Sign up information is provided on this After Visit Summary.  MyChart is used to connect with patients for Virtual Visits (Telemedicine).  Patients are able to view lab/test results, encounter notes, upcoming appointments, etc.  Non-urgent messages can be sent to your provider as well.   To learn more about what you can do with MyChart, go to [email or web address].    Your next appointment:   4 month(s)  Provider:   Norman Herrlich, MD    Other Instructions None

## 2023-05-04 LAB — PRO B NATRIURETIC PEPTIDE: NT-Pro BNP: 3248 pg/mL — ABNORMAL HIGH (ref 0–738)

## 2023-05-04 LAB — COMPREHENSIVE METABOLIC PANEL
ALT: 29 IU/L (ref 0–32)
AST: 24 IU/L (ref 0–40)
Albumin: 4.2 g/dL (ref 3.6–4.6)
Alkaline Phosphatase: 87 IU/L (ref 44–121)
BUN/Creatinine Ratio: 19 (ref 12–28)
BUN: 23 mg/dL (ref 10–36)
Bilirubin Total: 0.5 mg/dL (ref 0.0–1.2)
CO2: 25 mmol/L (ref 20–29)
Calcium: 9.8 mg/dL (ref 8.7–10.3)
Chloride: 96 mmol/L (ref 96–106)
Creatinine, Ser: 1.18 mg/dL — ABNORMAL HIGH (ref 0.57–1.00)
Globulin, Total: 2.7 g/dL (ref 1.5–4.5)
Glucose: 96 mg/dL (ref 70–99)
Potassium: 4.4 mmol/L (ref 3.5–5.2)
Sodium: 136 mmol/L (ref 134–144)
Total Protein: 6.9 g/dL (ref 6.0–8.5)
eGFR: 44 mL/min/{1.73_m2} — ABNORMAL LOW (ref >59)

## 2023-05-04 LAB — LIPID PANEL
Chol/HDL Ratio: 2.5 ratio (ref 0.0–4.4)
Cholesterol, Total: 157 mg/dL (ref 100–199)
HDL: 64 mg/dL (ref >39)
LDL Chol Calc (NIH): 78 mg/dL (ref 0–99)
Triglycerides: 76 mg/dL (ref 0–149)
VLDL Cholesterol Cal: 15 mg/dL (ref 5–40)

## 2023-05-24 ENCOUNTER — Telehealth: Payer: Self-pay

## 2023-05-24 NOTE — Telephone Encounter (Signed)
 Called patients daughter to inform her of Alver Fisher decision and discuss options. Call went straight to voicemail. Left message for her to call me back.

## 2023-05-25 NOTE — Telephone Encounter (Signed)
 Left another voicemail and sent a mychart message.

## 2023-06-20 ENCOUNTER — Ambulatory Visit

## 2023-06-26 ENCOUNTER — Encounter: Payer: Self-pay | Admitting: Family Medicine

## 2023-06-26 ENCOUNTER — Ambulatory Visit (INDEPENDENT_AMBULATORY_CARE_PROVIDER_SITE_OTHER): Admitting: Family Medicine

## 2023-06-26 VITALS — BP 120/57 | HR 87 | Temp 97.5°F | Resp 18 | Ht <= 58 in | Wt 147.6 lb

## 2023-06-26 DIAGNOSIS — I4821 Permanent atrial fibrillation: Secondary | ICD-10-CM

## 2023-06-26 DIAGNOSIS — G2581 Restless legs syndrome: Secondary | ICD-10-CM

## 2023-06-26 DIAGNOSIS — E039 Hypothyroidism, unspecified: Secondary | ICD-10-CM

## 2023-06-26 DIAGNOSIS — E782 Mixed hyperlipidemia: Secondary | ICD-10-CM

## 2023-06-26 DIAGNOSIS — K219 Gastro-esophageal reflux disease without esophagitis: Secondary | ICD-10-CM | POA: Diagnosis not present

## 2023-06-26 DIAGNOSIS — I509 Heart failure, unspecified: Secondary | ICD-10-CM

## 2023-06-26 DIAGNOSIS — E1165 Type 2 diabetes mellitus with hyperglycemia: Secondary | ICD-10-CM

## 2023-06-26 DIAGNOSIS — I10 Essential (primary) hypertension: Secondary | ICD-10-CM

## 2023-06-26 DIAGNOSIS — Z7984 Long term (current) use of oral hypoglycemic drugs: Secondary | ICD-10-CM

## 2023-06-26 DIAGNOSIS — N390 Urinary tract infection, site not specified: Secondary | ICD-10-CM | POA: Diagnosis not present

## 2023-06-26 DIAGNOSIS — E119 Type 2 diabetes mellitus without complications: Secondary | ICD-10-CM

## 2023-06-26 DIAGNOSIS — B372 Candidiasis of skin and nail: Secondary | ICD-10-CM

## 2023-06-26 DIAGNOSIS — N1832 Chronic kidney disease, stage 3b: Secondary | ICD-10-CM

## 2023-06-26 MED ORDER — FAMOTIDINE 40 MG PO TABS: ORAL_TABLET | ORAL | 3 refills | Status: DC

## 2023-06-26 MED ORDER — LEVOTHYROXINE SODIUM 88 MCG PO TABS: 88.0000 ug | ORAL_TABLET | Freq: Every day | ORAL | 3 refills | Status: AC

## 2023-06-26 MED ORDER — OMEPRAZOLE 40 MG PO CPDR: 40.0000 mg | DELAYED_RELEASE_CAPSULE | Freq: Every day | ORAL | 3 refills | Status: DC

## 2023-06-26 MED ORDER — PRAMIPEXOLE DIHYDROCHLORIDE 1 MG PO TABS: 1.0000 mg | ORAL_TABLET | Freq: Every evening | ORAL | 3 refills | Status: AC

## 2023-06-26 MED ORDER — NYSTATIN 100000 UNIT/GM EX CREA: 1.0000 | TOPICAL_CREAM | Freq: Two times a day (BID) | CUTANEOUS | 1 refills | Status: DC

## 2023-06-26 MED ORDER — METFORMIN HCL ER 500 MG PO TB24: 500.0000 mg | ORAL_TABLET | Freq: Every day | ORAL | 3 refills | Status: DC

## 2023-06-26 MED ORDER — SULFAMETHOXAZOLE-TRIMETHOPRIM 400-80 MG PO TABS: ORAL_TABLET | ORAL | 3 refills | Status: DC

## 2023-06-26 NOTE — Assessment & Plan Note (Signed)
Stable rate today. Asymptomatic. Continue current meds and following with cardiology.

## 2023-06-26 NOTE — Assessment & Plan Note (Signed)
Stable today. Asymptomatic. Continue current meds and following with cardiology. Updating labs today

## 2023-06-26 NOTE — Assessment & Plan Note (Signed)
 Labs today

## 2023-06-26 NOTE — Assessment & Plan Note (Signed)
Doing well on Mirapex. No new concerns.

## 2023-06-26 NOTE — Assessment & Plan Note (Signed)
Labs today Refilling metformin  Discussed lifestyle measures

## 2023-06-26 NOTE — Assessment & Plan Note (Signed)
Doing well on Bactrim for prevention. No acute concerns.

## 2023-06-26 NOTE — Assessment & Plan Note (Signed)
 Stable with Prilosec and Pepcid  Lifestyle measures for reflux: - Avoid meals or carbonated beverages within 3 hours of bedtime - Minimize intake of fried, fatty, and spicy foods (this will help decrease gastric acid production) - Raise the head of the bed using 4-6 inch blocks (especially if symptoms are present at night) - Maintain a healthy weight and avoid tight fitting clothes, especially around the waist  - Avoid foods that relax the sphincter or worsen symptoms (chocolate, peppermint, fatty foods, citrus, spicy foods, tomatoes, coffee, caffeine)  - Minimize use of NSAIDs (ibuprofen, Aleve, etc), nicotine, and alcohol 

## 2023-06-26 NOTE — Progress Notes (Signed)
 Established Patient Office Visit  Subjective    Patient ID: Dana Bradford, female    DOB: 19-May-1930  Age: 89 y.o. MRN: [id number]  CC:  Chief Complaint  Patient presents with   Medication Refill    HPI Dana Bradford presents for routine follow-up. Here with her daughter. She has been doing well overall but has recently noticed a flare in yeast infection under breasts as the weather gets warmer. She had leftover antifungal cream which has helped - requesting we send in for as needed use.    A.fib, CHF, HTN: - Eliquis  2.5 mg BID, metoprolol  succinate 50 mg AM and 25 mg PM, furosemide  40 mg BID (potassium 20 mEq daily), metolazone  2.5 mg every 5 days, pravastatin  20 mg daily - Diagnosed with Afib around 2021. She follows with [facility] every 6 months or so.  - Reports occasional BLE edema, but well controlled overall. Wears compression socks.   Gout: - colchicine  0.6 mg daily (mostly flares in thumbs, no problems recently)  Iron deficiency: - supplementation twice daily 3 days per week   Hypothyroidism: - levothyroxine  88 mcg daily  - asymptomatic. No concerns today.   Diabetes: - metformin  XR 500 mg with dinner - Reports stable and no symptoms. She eats a general diet mostly.   GERD: - Reports well controlled on Pepcid , Prilosec and lifestyle measures.   Restless legs: - She gets good relief with pramipexole  1 mg nightly. No new concerns.   Hx of frequently UTIs: - She has been taking bactrim  400-80 mg, half tablet three times per week and that seems to be decreasing UTIs. She reports history of vaginal prolapse as well.       ROS All review of systems negative except what is listed in the HPI      Objective    BP (!) 120/57   Pulse 87   Temp (!) 97.5 F (36.4 C) (Oral)   Resp 18   Ht 4\' 10"  (1.473 m)   Wt 147 lb 9.6 oz (67 kg)   SpO2 99%   BMI 30.85 kg/m   Physical Exam Vitals reviewed.  Constitutional:      Appearance: Normal appearance.  She is not ill-appearing.  Cardiovascular:     Rate and Rhythm: Normal rate. Rhythm irregular.     Pulses: Normal pulses.     Heart sounds: Normal heart sounds.  Pulmonary:     Effort: Pulmonary effort is normal.     Breath sounds: Normal breath sounds.  Musculoskeletal:     Right lower leg: 1+ Edema present.     Left lower leg: 1+ Edema present.  Skin:    General: Skin is warm and dry.  Neurological:     Mental Status: She is alert and oriented to person, place, and time.  Psychiatric:        Mood and Affect: Mood normal.        Behavior: Behavior normal.        Thought Content: Thought content normal.        Judgment: Judgment normal.            Assessment & Plan:   Problem List Items Addressed This Visit       Active Problems   Restless leg syndrome (Chronic)   Doing well on Mirapex . No new concerns.       Relevant Medications   pramipexole  (MIRAPEX ) 1 MG tablet   Essential hypertension, benign - Primary (Chronic)   Blood pressure is at  goal for age and co-morbidities.   Recommendations: continue metoprolol  and lasix  - BP goal <130/80 - monitor and log blood pressures at home - check around the same time each day in a relaxed setting - Limit salt to <2000 mg/day - Follow DASH eating plan (heart healthy diet) - limit alcohol  to 2 standard drinks per day for men and 1 per day for women - avoid tobacco products - get at least 2 hours of regular aerobic exercise weekly Patient aware of signs/symptoms requiring further/urgent evaluation. Labs updated today. Following with cardiology       GERD (gastroesophageal reflux disease) (Chronic)   Stable with Prilosec and Pepcid  Lifestyle measures for reflux: - Avoid meals or carbonated beverages within 3 hours of bedtime - Minimize intake of fried, fatty, and spicy foods (this will help decrease gastric acid production) - Raise the head of the bed using 4-6 inch blocks (especially if symptoms are present at  night) - Maintain a healthy weight and avoid tight fitting clothes, especially around the waist  - Avoid foods that relax the sphincter or worsen symptoms (chocolate, peppermint, fatty foods, citrus, spicy foods, tomatoes, coffee, caffeine)  - Minimize use of NSAIDs (ibuprofen, Aleve, etc), nicotine, and alcohol        Relevant Medications   famotidine  (PEPCID ) 40 MG tablet   omeprazole  (PRILOSEC) 40 MG capsule   Hypothyroidism (Chronic)   Previously well controlled Continue Synthroid  at current dose  Recheck TSH and adjust Synthroid  as indicated       Relevant Medications   levothyroxine  (SYNTHROID ) 88 MCG tablet   Other Relevant Orders   TSH   Hyperlipidemia (Chronic)   Medication management: pravastatin  20 mg daily Lifestyle factors for lowering cholesterol include: Diet therapy - heart-healthy diet rich in fruits, veggies, fiber-rich whole grains, lean meats, chicken, fish (at least twice a week), fat-free or 1% dairy products; foods low in saturated/trans fats, cholesterol, sodium, and sugar. Mediterranean diet has shown to be very heart healthy. Regular exercise - recommend at least 30 minutes a day, 5 times per week Weight management  Recently checked by cardiology       CHF (congestive heart failure) (HCC) (Chronic)   Stable today. Asymptomatic. Continue current meds and following with cardiology. Updating labs today       Permanent atrial fibrillation (HCC) (Chronic)   Stable rate today. Asymptomatic. Continue current meds and following with cardiology.       Controlled type 2 diabetes mellitus with hyperglycemia, without long-term current use of insulin  (HCC) (Chronic)   Labs today Refilling metformin   Discussed lifestyle measures       Relevant Medications   metFORMIN  (GLUCOPHAGE -XR) 500 MG 24 hr tablet   Frequent UTI (Chronic)   Doing well on Bactrim  for prevention. No acute concerns.       Relevant Medications   sulfamethoxazole -trimethoprim  (BACTRIM )  400-80 MG tablet   nystatin cream (MYCOSTATIN)   Stage 3b chronic kidney disease (HCC) (Chronic)   Labs today       Other Visit Diagnoses       Type 2 diabetes mellitus without complication, without long-term current use of insulin  (HCC)       Relevant Medications   metFORMIN  (GLUCOPHAGE -XR) 500 MG 24 hr tablet   Other Relevant Orders   Hemoglobin A1c   Microalbumin / creatinine urine ratio   Basic metabolic panel with GFR     Candidiasis, intertrigo     Fungal infection under the breast, likely due to yeast. Prefers cream over powder  for treatment. - Prescribe antifungal cream to be used twice daily until symptoms resolve, then as needed for flare-ups. Discussed supportive measures and prevention.    Relevant Medications   nystatin cream (MYCOSTATIN)       Return in about 6 months (around 12/27/2023) for routine follow-up.   Everlina Hock, NP

## 2023-06-26 NOTE — Assessment & Plan Note (Signed)
 Medication management: pravastatin  20 mg daily Lifestyle factors for lowering cholesterol include: Diet therapy - heart-healthy diet rich in fruits, veggies, fiber-rich whole grains, lean meats, chicken, fish (at least twice a week), fat-free or 1% dairy products; foods low in saturated/trans fats, cholesterol, sodium, and sugar. Mediterranean diet has shown to be very heart healthy. Regular exercise - recommend at least 30 minutes a day, 5 times per week Weight management  Recently checked by cardiology

## 2023-06-26 NOTE — Assessment & Plan Note (Signed)
 Blood pressure is at goal for age and co-morbidities.   Recommendations: continue metoprolol  and lasix  - BP goal <130/80 - monitor and log blood pressures at home - check around the same time each day in a relaxed setting - Limit salt to <2000 mg/day - Follow DASH eating plan (heart healthy diet) - limit alcohol  to 2 standard drinks per day for men and 1 per day for women - avoid tobacco products - get at least 2 hours of regular aerobic exercise weekly Patient aware of signs/symptoms requiring further/urgent evaluation. Labs updated today. Following with cardiology

## 2023-06-26 NOTE — Assessment & Plan Note (Signed)
 Previously well controlled Continue Synthroid at current dose  Recheck TSH and adjust Synthroid as indicated

## 2023-06-27 LAB — BASIC METABOLIC PANEL WITH GFR
BUN: 32 mg/dL — ABNORMAL HIGH (ref 6–23)
CO2: 29 meq/L (ref 19–32)
Calcium: 9.3 mg/dL (ref 8.4–10.5)
Chloride: 101 meq/L (ref 96–112)
Creatinine, Ser: 0.95 mg/dL (ref 0.40–1.20)
GFR: 52.65 mL/min — ABNORMAL LOW (ref >60.00)
Glucose, Bld: 93 mg/dL (ref 70–99)
Potassium: 4.1 meq/L (ref 3.5–5.1)
Sodium: 141 meq/L (ref 135–145)

## 2023-06-27 LAB — MICROALBUMIN / CREATININE URINE RATIO
Creatinine,U: 66.9 mg/dL
Microalb Creat Ratio: 17.7 mg/g (ref 0.0–30.0)
Microalb, Ur: 1.2 mg/dL (ref 0.0–1.9)

## 2023-06-27 LAB — HEMOGLOBIN A1C: Hgb A1c MFr Bld: 6.4 % (ref 4.6–6.5)

## 2023-06-27 LAB — TSH: TSH: 0.35 u[IU]/mL (ref 0.35–5.50)

## 2023-06-28 ENCOUNTER — Encounter: Payer: Self-pay | Admitting: Family Medicine

## 2023-08-17 ENCOUNTER — Ambulatory Visit
Admission: RE | Admit: 2023-08-17 | Discharge: 2023-08-17 | Disposition: A | Discharge status: Home / Self Care | Source: Ambulatory Visit | Attending: Adult Health | Admitting: Adult Health

## 2023-08-17 DIAGNOSIS — Z170 Estrogen receptor positive status [ER+]: Secondary | ICD-10-CM

## 2023-08-18 ENCOUNTER — Encounter: Payer: Self-pay | Admitting: Cardiology

## 2023-09-20 ENCOUNTER — Ambulatory Visit: Admitting: Cardiology

## 2023-09-26 ENCOUNTER — Ambulatory Visit: Admitting: Cardiology

## 2023-11-15 ENCOUNTER — Ambulatory Visit: Payer: Self-pay

## 2023-11-15 ENCOUNTER — Other Ambulatory Visit (HOSPITAL_BASED_OUTPATIENT_CLINIC_OR_DEPARTMENT_OTHER): Payer: Self-pay

## 2023-11-15 MED ORDER — CEFDINIR 300 MG PO CAPS
300.0000 mg | ORAL_CAPSULE | Freq: Every day | ORAL | 0 refills | Status: DC
  Filled 2023-11-15: qty 5, 5d supply, fill #0

## 2023-11-15 NOTE — Progress Notes (Signed)
 Subjective:    Patient ID:  Dana Bradford is a 89 y.o. female  Chief Complaint: Chief Complaint  Patient presents with  . Urinary Frequency    The patient presents with symptoms that developed 1 month ago. She reports flank pain, more on the left and frequency. Also reports strong smell and dark color to urine. Has not taken anything for symptoms.     HPI  History is provided by the patient.  Patient presents with Left low back pain radiating to the left buttock, onset in the last month, returning this morning.  Associated strong smelling dark urine color to the urine.  Denies pain now.  Denies fever, abdominal pain, chest pain, or any other acute symptoms.   Past medical, surgical and family history reviewed.    Social History   Tobacco Use: Medium Risk (06/26/2023)   Received from [facility]   Patient History   . Smoking Tobacco Use: Former   . Smokeless Tobacco Use: Never   . Passive Exposure: Past      Review of Systems  Constitutional:  Negative for fever.  Gastrointestinal:  Negative for abdominal pain and vomiting.  Genitourinary:  Negative for dysuria and flank pain.  Musculoskeletal:  Positive for back pain (absent in clinic).     Objective:   Vitals:   11/15/23 1650  BP: 122/86  BP Location: Left Upper Arm  Patient Position: Sitting  Pulse: 92  Resp: 16  Temp: 98.8 F (37.1 C)  TempSrc: Oral  SpO2: 97%  Weight: 137 lb (62.1 kg)  Height: 4' 10 (1.473 m)     Physical Exam Vitals and nursing note reviewed.  Constitutional:      General: She is not in acute distress.    Appearance: Normal appearance. She is not ill-appearing.  Cardiovascular:     Rate and Rhythm: Normal rate and regular rhythm.  Pulmonary:     Effort: Pulmonary effort is normal.  Abdominal:     Tenderness: There is no right CVA tenderness or left CVA tenderness.  Skin:    Findings: No erythema.  Neurological:     Mental Status: She is alert.       Assessment:     Results of tests available at time of visit: POC Urine Dipstick [[id number]] (Abnormal)  Resulted: 11/15/23 1706, Result status: Final result   Resulted by: NS Resulting lab: [facility]    Components    Component Value Reference Range Flag  Color Amber Yellow A*  Clarity Turbid Clear A*  Glucose,Urine Negative Negative mg/dL --  Bilirubin Negative Negative --  Ketones,Urine Negative Negative mg/dL --  Specific Gravity 8.979 1.005, 1.010, 1.015, 1.020, 1.025, 1.030 --  Blood Negative Negative --  pH 6.0 -- --  Protein +/- Negative mg/dL A*  Urobilinogen 0.2 0.2, 1.0 EU/dL --  Nitrite Negative Negative --  Leukocyte Esterase 1+ Negative A*              Imaging: No results found.    Plan:   1. Bacterial UTI   2. Urination frequency     Orders Placed This Encounter  Procedures  . Culture, Urine Urine  . POC Urine Dipstick    Dana Bradford was seen today for urinary frequency.  Diagnoses and all orders for this visit:  Bacterial UTI -     cefdinir  (OMNICEF ) 300 mg capsule; Take one capsule (300 mg dose) by mouth daily for 5 days. -     Culture, Urine Urine  Urination frequency -     POC Urine Dipstick -     cefdinir  (OMNICEF ) 300 mg capsule; Take one capsule (300 mg dose) by mouth daily for 5 days. -     Culture, Urine Urine      MDM:  Dana Bradford is a 89 y.o. female with acute bacterial UTI.  Mild symptoms.  No pyelonephritis.  Cefdinir  prescribed daily for 5 days.  Counseled on orange to red rusty stool side effect while taking iron.  Counseled to drink lots of fluids.  Urine culture pending at the time of discharge.  To the ER if any worsening pain, fevers or abdominal pain.  I reviewed a CMP from an outside source, dated 09/14/2021, and the creatinine was 1.09. CrCl estimated 33.  I will use renal dosing because this CMP was from 2 years ago and there is a significant possibility that CrCl has gone below 30 since then.  No  significant symptoms of CES.  Coding: 2 tests and 1 lab from outside source reviewed.  1 prescription.   The patient indicates understanding of these issues and agrees with the plan. Bernardino ONEIDA Schiller, PA-C    Text above is via Dragon naturally speaking voice recognition transcription. - there may be typographical errors.  There are no Patient Instructions on file for this visit.   *Some images could not be shown.

## 2023-11-15 NOTE — Telephone Encounter (Signed)
 FYI Only or Action Required?: FYI only for provider.  Patient was last seen in primary care on 06/26/2023 by Almarie Waddell NOVAK, NP.  Called Nurse Triage reporting Urinary Frequency.  Symptoms began 2 days ago.  Interventions attempted: Rest, hydration, or home remedies.  Symptoms are: gradually worsening.  Triage Disposition: See HCP Within 4 Hours (Or PCP Triage)  Patient/caregiver understands and will follow disposition?: Yes----Going to Urgent Care today due to no availability in PCP office or region offices      Copied from CRM [id number]. Topic: Clinical - Red Word Triage >> Nov 15, 2023  1:23 PM Mia F wrote: Red Word that prompted transfer to Nurse Triage: Pain in lower back. Very shaky and unsteady on her feet. Urinating frequently even when she is not drinking. Nausea and no appetite. Has been going on for about two days. Seems to be getting worse. Reason for Disposition . Side (flank) or lower back pain present  Answer Assessment - Initial Assessment Questions Patient's daughter states that her mother (the patient) has been feeling generally unwell for the past two days She has been urinating frequently, having lower back pain, experiencing generalized malaise and feeling shaky. No availability in PCP office or offices within this region Daughter is advised that it is recommended at this time to take the patient to Urgent Care for further evaluation by a provider. Daughter is agreeable with this plan Daughter is also advised that if anything worsens to take the patient straight to the Emergency Room Daughter verbalized understanding of this as well  1. SYMPTOM: What's the main symptom you're concerned about? (e.g., frequency, incontinence)     Frequency with lower back pain 2. ONSET: When did the  frequency  start?     Two days ago she advised her daughter 5. PAIN: Is there any pain? If Yes, ask: How bad is it? (Scale: 1-10; mild, moderate, severe)     Lower back  pain 4. CAUSE: What do you think is causing the symptoms?     unsure 5. OTHER SYMPTOMS: Do you have any other symptoms? (e.g., blood in urine, fever, flank pain, pain with urination)     Generalized malaise, feeling shaky, nausea  Protocols used: Urinary Symptoms-A-AH

## 2023-11-15 NOTE — Telephone Encounter (Signed)
FYI. Pt going to UC.

## 2023-11-17 ENCOUNTER — Encounter: Payer: Self-pay | Admitting: Family Medicine

## 2023-12-18 NOTE — Progress Notes (Signed)
 HPI: FU permanent atrial fibrillation; previously followed by Dr Monetta but transitioning to me. Echo 2/24 showed normal LV function, severe LAE, mild MR, mild to moderate TR. Since last seen, she denies dyspnea, chest pain, palpitations, syncope or bleeding.  No falls.  Current Outpatient Medications  Medication Sig Dispense Refill   apixaban  (ELIQUIS ) 5 MG TABS tablet Take 1/2 tablet (2.5mg ) by mouth two times daily 90 tablet 1   Cholecalciferol  (VITAMIN D3) 50 MCG (2000 UT) TABS Take 2,000 Units by mouth daily.     clotrimazole -betamethasone  (LOTRISONE ) cream Apply 1 Application topically daily as needed or as directed (Patient taking differently: Apply 1 Application topically daily as needed (rash).) 45 g 5   colchicine  0.6 MG tablet Take 1 tablet (0.6 mg total) by mouth daily. 90 tablet 3   famotidine  (PEPCID ) 40 MG tablet TAKE 1 TABLET NIGHTLY AS NEEDED FOR HEARTBURN 90 tablet 3   ferrous sulfate  325 (65 FE) MG tablet Take 325 mg by mouth 2 (two) times daily with a meal.     furosemide  (LASIX ) 40 MG tablet Take 1 tablet (40 mg total) by mouth 2 (two) times daily. 180 tablet 3   levothyroxine  (SYNTHROID ) 88 MCG tablet Take 1 tablet (88 mcg total) by mouth daily before breakfast. 90 tablet 3   metFORMIN  (GLUCOPHAGE -XR) 500 MG 24 hr tablet Take 1 tablet (500 mg total) by mouth at bedtime. 90 tablet 3   metolazone  (ZAROXOLYN ) 2.5 MG tablet Please take this medication every 5 days. Start taking tomorrow. 45 tablet 3   metoprolol  succinate (TOPROL -XL) 50 MG 24 hr tablet Take 1 tablet (50 mg total) by mouth See admin instructions. 1 TABLET AM AND 1/2 TAB PM 135 tablet 3   nystatin  cream (MYCOSTATIN ) Apply 1 Application topically 2 (two) times daily. 30 g 1   omeprazole  (PRILOSEC) 40 MG capsule Take 1 capsule (40 mg total) by mouth daily. 90 capsule 3   potassium chloride  SA (KLOR-CON  M20) 20 MEQ tablet Take 1 tablet (20 mEq total) by mouth daily. 90 tablet 3   pramipexole  (MIRAPEX ) 1 MG  tablet Take 1 tablet (1 mg total) by mouth every evening. 90 tablet 3   pravastatin  (PRAVACHOL ) 20 MG tablet Take 1 tablet (20 mg total) by mouth daily. 90 tablet 3   sulfamethoxazole -trimethoprim  (BACTRIM ) 400-80 MG tablet Take 1/2 tablet by mouth, 3 times weekly. 18 tablet 3   timolol  (TIMOPTIC ) 0.5 % ophthalmic solution Place 1 drop into both eyes daily.     triamcinolone  ointment (KENALOG ) 0.1 % Apply 1 application topically 2 (two) times daily as needed (skin irritation.).     vitamin C (ASCORBIC ACID ) 500 MG tablet Take 500 mg by mouth 3 (three) times a week. Monday , Wednesday and Friday     apixaban  (ELIQUIS ) 5 MG TABS tablet Take 1/2 tablet by mouth two times daily 56 tablet 0   cefdinir  (OMNICEF ) 300 MG capsule Take one capsule (300 mg dose) by mouth daily for 5 days. 5 capsule 0   No current facility-administered medications for this visit.     Past Medical History:  Diagnosis Date   Acute CHF (congestive heart failure) (HCC) 04/22/2020   Acute kidney failure 2016 to 2017 due to vacomycin   no kidney issues now   Anemia    Antibiotic-induced nephrotoxicity 05/26/2020   Anxiety    ARF (acute renal failure) 04/22/2020   Arthritis    Atrial fibrillation (HCC)    CHF (congestive heart failure) (HCC) 04/22/2020  CHF exacerbation (HCC) 04/23/2020   Chronic venous insufficiency 03/01/2019   Last Assessment & Plan:   Formatting of this note might be different from the original.  CVI:       Frequent leg elevation above heart level for 30 minutes each session throughout the day     Controlling swelling is lifelong goal to reduce risk of recurrent venous leg ulcers.       Avoid sodium and processed foods to reduce swelling.      Lifelong compression advised once RLE dermatitis is resolve   Controlled type 2 diabetes mellitus with hyperglycemia, without long-term current use of insulin  (HCC) 10/18/2017   Degeneration of lumbar intervertebral disc 12/02/2019   Degenerative  spondylolisthesis 12/02/2019   Depression    Diabetes mellitus without complication (HCC)    Diabetic ulcer of toe of right foot associated with type 2 diabetes mellitus, with fat layer exposed (HCC) 02/08/2019   Last Assessment & Plan:   Formatting of this note is different from the original.  Patient tolerated selective debridement as above. Left dorsum foot ulcer making good progress toward closure.      According to recent blood flow studys patient bloodflow is adequate for healing, bilaterally.      We have reinforced purpose and importance of complete offloading and glycemic control.     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 macular degeneration of left eye 10/18/2017   Left leg cellulitis 01/18/2020   Lymphedema of both lower extremities 12/26/2014   Malignant neoplasm of overlapping sites of left female breast (HCC) 09/26/2022   OA (osteoarthritis) of hip 02/28/2018   Osteopenia 08/26/2021   Osteoporosis 09/16/2021   Other specified acquired hypothyroidism    Pain in joint of right hip 02/06/2018   Pain in left knee 11/20/2019   Permanent atrial fibrillation (HCC)    Pneumonia    Post-menopause 08/26/2021   Primary open angle glaucoma (POAG) of both eyes, indeterminate stage 10/18/2017   Restless leg    Restless leg    Restless leg  syndrome 06/05/2012   Retinal artery branch occlusion of right eye 06/12/2017   Last Assessment & Plan:   Formatting of this note might be different from the original.  Relevant Hx:  Course:  Daily Update:  Today's Plan:     Right ankle pain 08/26/2021   Scoliosis deformity of spine 12/02/2019   Skin cancer    Forehead   Stage 3b chronic kidney disease (HCC) 02/23/2021   Status post amputation of lesser toe of right foot 05/13/2017   Formatting of this note might be different from the original. 05/13/17 Op note-Mrs. Dols is a very pleasant 89y/o female who presented to the ED with infection to her right foot. Xrays and MRI concerning for acute osteomyelitis of right 5th toe.Proceed with amputation of right 5th toe to help ensure quick resolution of  infection to help prevent progression.     Stroke (HCC) 05/2017   ocular stoke on plavix  , right eye some vision loss   Vaginal vault prolapse 08/12/2014   Venous hypertension of both lower extremities 12/26/2014   Venous stasis dermatitis of left lower extremity 06/05/2018   Venous stasis ulcer (HCC)     Past Surgical History:  Procedure Laterality Date   ABDOMINAL HYSTERECTOMY  1993   Uterine Prolapse partial   BREAST BIOPSY Left 08/26/2022   MM LT BREAST BX W LOC DEV 1ST LESION IMAGE BX SPEC STEREO GUIDE 08/26/2022 [facility]   BREAST BIOPSY Left 08/26/2022   US  LT BREAST BX W LOC DEV 1ST LESION IMG BX SPEC US  GUIDE 08/26/2022 [facility]   BREAST BIOPSY  10/25/2022   MM LT RADIOACTIVE SEED LOC MAMMO GUIDE 10/25/2022 [facility]   BREAST LUMPECTOMY WITH RADIOACTIVE SEED LOCALIZATION Left 10/27/2022   Procedure: LEFT BREAST LUMPECTOMY WITH RADIOACTIVE SEED LOCALIZATION;  Surgeon: Vernetta Berg, MD;  Location:  SUR[facility]ERY CENTER;  Service: General;  Laterality: Left;  LMA   MASTECTOMY  1974   right breast not sure if cancer 1960's   REPLACEMENT TOTAL KNEE BILATERAL Bilateral    SMALL TOE RIGHT FOOT REMOVED  04/2017    DUE TO INFECTION   Spleenectomy  2000   TOTAL HIP ARTHROPLASTY Right 02/28/2018   Procedure: RIGHT TOTAL HIP ARTHROPLASTY ANTERIOR APPROACH;  Surgeon: Melodi Lerner, MD;  Location: [facility];  Service: Orthopedics;  Laterality: Right;    TOTAL HIP ARTHROPLASTY Left 01/22/2020   Procedure: TOTAL HIP ARTHROPLASTY ANTERIOR APPROACH;  Surgeon: Melodi Lerner, MD;  Location: [facility];  Service: Orthopedics;  Laterality: Left;    TOTAL KNEE ARTHROPLASTY WITH REVISION COMPONENTS      Social History   Socioeconomic History   Marital status: Widowed    Spouse name: Not on file   Number of children: 2   Years of education: 61   Highest education level: Not on file  Occupational History   Occupation: Retired   Tobacco Use   Smoking status: Former    Current packs/day: 0.00    Average packs/day: 1 pack/day for 30.0 years (30.0 ttl pk-yrs)    Types: Cigarettes    Start date: 78    Quit date: 57    Years since quitting: 51.8    Passive exposure: Past   Smokeless tobacco: Never   Tobacco comments:    qiot 50 yrs ago  Vaping Use   Vaping status: Never Used  Substance and Sexual Activity   Alcohol  use: Not Currently   Drug use: No   Sexual activity: Not Currently    Birth control/protection: Surgical    Comment: Hysterectomy  Other Topics Concern   Not on file  Social History Narrative   Marital Status: Widowed    Children:  Daughter Bethann Cleaves) Son Shaun Zuccaro)   Pets: None    Living Situation: Lives alone    Occupation: Retired    Education: Engineer, Agricultural    Tobacco:  She quit smoking 35 years ago after having smoked 1 ppd for 20 years.      Alcohol  Use:  One glass of wine twice a week     Drug Use:  None   Diet:  Regular   Exercise:  None   Hobbies: Crafting, Gardening.             Social Drivers of Health   Financial Resource Strain: Patient Declined (06/25/2023)  Overall Financial Resource Strain (CARDIA)    Difficulty of Paying Living Expenses:  Patient declined  Food Insecurity: Patient Declined (06/25/2023)   Hunger Vital Sign    Worried About Running Out of Food in the Last Year: Patient declined    Ran Out of Food in the Last Year: Patient declined  Transportation Needs: Patient Declined (06/25/2023)   PRAPARE - Administrator, Civil Service (Medical): Patient declined    Lack of Transportation (Non-Medical): Patient declined  Physical Activity: Unknown (06/25/2023)   Exercise Vital Sign    Days of Exercise per Week: 0 days    Minutes of Exercise per Session: Not on file  Stress: Patient Declined (06/25/2023)   [employer] of Occupational Health - Occupational Stress Questionnaire    Feeling of Stress : Patient declined  Social Connections: Unknown (06/25/2023)   Social Connection and Isolation Panel    Frequency of Communication with Friends and Family: Patient declined    Frequency of Social Gatherings with Friends and Family: Patient declined    Attends Religious Services: Patient declined    Database Administrator or Organizations: Patient declined    Attends Banker Meetings: Not on file    Marital Status: Widowed  Intimate Partner Violence: Unknown (06/23/2021)   Received from [facility] visits prior to 04/23/2022.   Humiliation, Afraid, Rape, and Kick questionnaire    Within the last year, have you been afraid of your partner or ex-partner?: Patient refused    Within the last year, have you been humiliated or emotionally abused in other ways by your partner or ex-partner?: Patient refused    Within the last year, have you been kicked, hit, slapped, or otherwise physically hurt by your partner or ex-partner?: Patient refused    Within the last year, have you been raped or forced to have any kind of sexual activity by your partner or ex-partner?: Patient refused    Family History  Problem Relation Age of Onset   Cancer Mother    Heart disease Father    Hyperlipidemia Father     Aneurysm Sister    Kidney disease Brother     ROS: no fevers or chills, productive cough, hemoptysis, dysphasia, odynophagia, melena, hematochezia, dysuria, hematuria, rash, seizure activity, orthopnea, PND, pedal edema, claudication. Remaining systems are negative.  Physical Exam: Well-developed well-nourished in no acute distress.  Skin is warm and dry.  HEENT is normal.  Neck is supple.  Chest is clear to auscultation with normal expansion.  Cardiovascular exam is irregular Abdominal exam nontender or distended. No masses palpated. Extremities show no edema. neuro grossly intact  EKG Interpretation Date/Time:  Thursday December 28 2023 15:44:07 EST Ventricular Rate:  91 PR Interval:    QRS Duration:  80 QT Interval:  368 QTC Calculation: 452 R Axis:   137  Text Interpretation: Atrial fibrillation Left posterior fascicular block Non-specific ST-t changes Confirmed by Pietro Rogue ([id number]) on 12/28/2023 3:51:07 PM    A/P  1 Permanent atrial fibrillation-continue toprol  for rate control; continue apixaban .  Check hemoglobin and renal function.  2 HTN-BP controlled; continue present meds and follow.  3 chronic diastolic CHF-continue lasix  and metolazone  at present dose.  Check potassium and renal function.  4 chronic venous stasis  5 hyperlipidemia-continue statin.  Check lipids and liver.  Rogue Pietro, MD

## 2023-12-28 ENCOUNTER — Encounter: Payer: Self-pay | Admitting: Cardiology

## 2023-12-28 ENCOUNTER — Ambulatory Visit: Attending: Cardiology | Admitting: Cardiology

## 2023-12-28 VITALS — BP 118/74 | HR 94 | Ht <= 58 in | Wt 145.4 lb

## 2023-12-28 DIAGNOSIS — E782 Mixed hyperlipidemia: Secondary | ICD-10-CM | POA: Insufficient documentation

## 2023-12-28 DIAGNOSIS — Z7901 Long term (current) use of anticoagulants: Secondary | ICD-10-CM | POA: Diagnosis present

## 2023-12-28 DIAGNOSIS — I10 Essential (primary) hypertension: Secondary | ICD-10-CM | POA: Diagnosis present

## 2023-12-28 DIAGNOSIS — I5032 Chronic diastolic (congestive) heart failure: Secondary | ICD-10-CM | POA: Insufficient documentation

## 2023-12-28 DIAGNOSIS — I4821 Permanent atrial fibrillation: Secondary | ICD-10-CM | POA: Diagnosis present

## 2023-12-28 DIAGNOSIS — N183 Chronic kidney disease, stage 3 unspecified: Secondary | ICD-10-CM | POA: Insufficient documentation

## 2023-12-28 DIAGNOSIS — I130 Hypertensive heart and chronic kidney disease with heart failure and stage 1 through stage 4 chronic kidney disease, or unspecified chronic kidney disease: Secondary | ICD-10-CM | POA: Insufficient documentation

## 2023-12-28 MED ORDER — METOPROLOL SUCCINATE ER 50 MG PO TB24: 50.0000 mg | ORAL_TABLET | ORAL | 3 refills | Status: DC

## 2023-12-28 MED ORDER — COLCHICINE 0.6 MG PO TABS: 0.6000 mg | ORAL_TABLET | Freq: Every day | ORAL | 3 refills | Status: AC

## 2023-12-28 MED ORDER — FUROSEMIDE 40 MG PO TABS: 40.0000 mg | ORAL_TABLET | Freq: Two times a day (BID) | ORAL | 3 refills | Status: DC

## 2023-12-28 MED ORDER — POTASSIUM CHLORIDE CRYS ER 20 MEQ PO TBCR: 20.0000 meq | EXTENDED_RELEASE_TABLET | Freq: Every day | ORAL | 3 refills | Status: DC

## 2023-12-28 MED ORDER — PRAVASTATIN SODIUM 20 MG PO TABS: 20.0000 mg | ORAL_TABLET | Freq: Every day | ORAL | 3 refills | Status: AC

## 2023-12-28 MED ORDER — METOLAZONE 2.5 MG PO TABS: ORAL_TABLET | ORAL | 3 refills | Status: DC

## 2023-12-28 NOTE — Addendum Note (Signed)
 Addended by: RICHIE ADRIEN ORN on: 12/28/2023 04:11 PM   Modules accepted: Orders

## 2023-12-28 NOTE — Patient Instructions (Signed)
   Lab Work:  Your physician recommends that you return for lab work FASTING  If you have labs (blood work) drawn today and your tests are completely normal, you will receive your results only by: MyChart Message (if you have MyChart) OR A paper copy in the mail If you have any lab test that is abnormal or we need to change your treatment, we will call you to review the results.  Follow-Up: At [facility], you and your health needs are our priority.  As part of our continuing mission to provide you with exceptional heart care, our providers are all part of one team.  This team includes your primary Cardiologist (physician) and Advanced Practice Providers or APPs (Physician Assistants and Nurse Practitioners) who all work together to provide you with the care you need, when you need it.  Your next appointment:   12 month(s)  Provider:   REDELL SHALLOW MD

## 2024-01-18 ENCOUNTER — Ambulatory Visit: Payer: Self-pay | Admitting: Cardiology

## 2024-01-18 LAB — COMPREHENSIVE METABOLIC PANEL WITH GFR
ALT: 19 IU/L (ref 0–32)
AST: 24 IU/L (ref 0–40)
Albumin: 4.5 g/dL (ref 3.6–4.6)
Alkaline Phosphatase: 85 IU/L (ref 48–129)
BUN/Creatinine Ratio: 21 (ref 12–28)
BUN: 24 mg/dL (ref 10–36)
Bilirubin Total: 0.9 mg/dL (ref 0.0–1.2)
CO2: 28 mmol/L (ref 20–29)
Calcium: 9.8 mg/dL (ref 8.7–10.3)
Chloride: 97 mmol/L (ref 96–106)
Creatinine, Ser: 1.15 mg/dL — ABNORMAL HIGH (ref 0.57–1.00)
Globulin, Total: 3 g/dL (ref 1.5–4.5)
Glucose: 106 mg/dL — ABNORMAL HIGH (ref 70–99)
Potassium: 3.8 mmol/L (ref 3.5–5.2)
Sodium: 139 mmol/L (ref 134–144)
Total Protein: 7.5 g/dL (ref 6.0–8.5)
eGFR: 45 mL/min/1.73 — ABNORMAL LOW (ref >59)

## 2024-01-18 LAB — LIPID PANEL
Chol/HDL Ratio: 2.7 ratio (ref 0.0–4.4)
Cholesterol, Total: 161 mg/dL (ref 100–199)
HDL: 59 mg/dL (ref >39)
LDL Chol Calc (NIH): 83 mg/dL (ref 0–99)
Triglycerides: 102 mg/dL (ref 0–149)
VLDL Cholesterol Cal: 19 mg/dL (ref 5–40)

## 2024-01-18 LAB — CBC
Hematocrit: 41.2 % (ref 34.0–46.6)
Hemoglobin: 13.4 g/dL (ref 11.1–15.9)
MCH: 32.1 pg (ref 26.6–33.0)
MCHC: 32.5 g/dL (ref 31.5–35.7)
MCV: 99 fL — ABNORMAL HIGH (ref 79–97)
Platelets: 239 x10E3/uL (ref 150–450)
RBC: 4.17 x10E6/uL (ref 3.77–5.28)
RDW: 12.5 % (ref 11.7–15.4)
WBC: 8.7 x10E3/uL (ref 3.4–10.8)

## 2024-01-22 ENCOUNTER — Other Ambulatory Visit: Payer: Self-pay

## 2024-01-22 DIAGNOSIS — I4821 Permanent atrial fibrillation: Secondary | ICD-10-CM

## 2024-01-22 MED ORDER — APIXABAN 5 MG PO TABS: ORAL_TABLET | ORAL | 1 refills | Status: AC

## 2024-01-22 NOTE — Telephone Encounter (Signed)
 Prescription refill request for Eliquis  received. Indication:afib Last office visit:11/25 Scr: 1.15  11/25 Age:89 Weight:66  kg  Prescription refilled

## 2024-02-20 ENCOUNTER — Emergency Department (HOSPITAL_COMMUNITY)
Admission: EM | Admit: 2024-02-20 | Discharge: 2024-02-20 | Discharge status: Against Medical Advice | Source: Home / Self Care

## 2024-02-29 ENCOUNTER — Emergency Department (HOSPITAL_COMMUNITY)

## 2024-02-29 ENCOUNTER — Other Ambulatory Visit: Payer: Self-pay

## 2024-02-29 ENCOUNTER — Observation Stay (HOSPITAL_COMMUNITY)

## 2024-02-29 ENCOUNTER — Encounter (HOSPITAL_COMMUNITY): Payer: Self-pay

## 2024-02-29 ENCOUNTER — Inpatient Hospital Stay (HOSPITAL_COMMUNITY)
Admission: EM | Admit: 2024-02-29 | Discharge: 2024-03-05 | DRG: 177 | Disposition: A | Discharge status: Skilled Nursing Facility | Attending: Internal Medicine | Admitting: Internal Medicine

## 2024-02-29 DIAGNOSIS — Z79890 Hormone replacement therapy (postmenopausal): Secondary | ICD-10-CM

## 2024-02-29 DIAGNOSIS — F0390 Unspecified dementia without behavioral disturbance: Secondary | ICD-10-CM | POA: Diagnosis present

## 2024-02-29 DIAGNOSIS — E1122 Type 2 diabetes mellitus with diabetic chronic kidney disease: Secondary | ICD-10-CM | POA: Diagnosis present

## 2024-02-29 DIAGNOSIS — K219 Gastro-esophageal reflux disease without esophagitis: Secondary | ICD-10-CM | POA: Diagnosis present

## 2024-02-29 DIAGNOSIS — Z9011 Acquired absence of right breast and nipple: Secondary | ICD-10-CM

## 2024-02-29 DIAGNOSIS — I509 Heart failure, unspecified: Secondary | ICD-10-CM

## 2024-02-29 DIAGNOSIS — E1142 Type 2 diabetes mellitus with diabetic polyneuropathy: Secondary | ICD-10-CM | POA: Diagnosis present

## 2024-02-29 DIAGNOSIS — Z8673 Personal history of transient ischemic attack (TIA), and cerebral infarction without residual deficits: Secondary | ICD-10-CM

## 2024-02-29 DIAGNOSIS — U071 COVID-19: Principal | ICD-10-CM | POA: Diagnosis present

## 2024-02-29 DIAGNOSIS — E1165 Type 2 diabetes mellitus with hyperglycemia: Secondary | ICD-10-CM | POA: Diagnosis not present

## 2024-02-29 DIAGNOSIS — E860 Dehydration: Secondary | ICD-10-CM | POA: Diagnosis present

## 2024-02-29 DIAGNOSIS — E11621 Type 2 diabetes mellitus with foot ulcer: Secondary | ICD-10-CM | POA: Diagnosis present

## 2024-02-29 DIAGNOSIS — I83013 Varicose veins of right lower extremity with ulcer of ankle: Secondary | ICD-10-CM | POA: Diagnosis present

## 2024-02-29 DIAGNOSIS — Z7984 Long term (current) use of oral hypoglycemic drugs: Secondary | ICD-10-CM

## 2024-02-29 DIAGNOSIS — M419 Scoliosis, unspecified: Secondary | ICD-10-CM | POA: Diagnosis present

## 2024-02-29 DIAGNOSIS — I5032 Chronic diastolic (congestive) heart failure: Secondary | ICD-10-CM | POA: Diagnosis present

## 2024-02-29 DIAGNOSIS — Z79899 Other long term (current) drug therapy: Secondary | ICD-10-CM

## 2024-02-29 DIAGNOSIS — E871 Hypo-osmolality and hyponatremia: Secondary | ICD-10-CM | POA: Diagnosis present

## 2024-02-29 DIAGNOSIS — E872 Acidosis, unspecified: Secondary | ICD-10-CM | POA: Diagnosis present

## 2024-02-29 DIAGNOSIS — N1832 Chronic kidney disease, stage 3b: Secondary | ICD-10-CM | POA: Diagnosis present

## 2024-02-29 DIAGNOSIS — Z96653 Presence of artificial knee joint, bilateral: Secondary | ICD-10-CM | POA: Diagnosis present

## 2024-02-29 DIAGNOSIS — E039 Hypothyroidism, unspecified: Secondary | ICD-10-CM | POA: Diagnosis present

## 2024-02-29 DIAGNOSIS — G2581 Restless legs syndrome: Secondary | ICD-10-CM | POA: Diagnosis present

## 2024-02-29 DIAGNOSIS — Z9081 Acquired absence of spleen: Secondary | ICD-10-CM

## 2024-02-29 DIAGNOSIS — M810 Age-related osteoporosis without current pathological fracture: Secondary | ICD-10-CM | POA: Diagnosis present

## 2024-02-29 DIAGNOSIS — Z8419 Family history of other disorders of kidney and ureter: Secondary | ICD-10-CM

## 2024-02-29 DIAGNOSIS — I10 Essential (primary) hypertension: Secondary | ICD-10-CM | POA: Diagnosis present

## 2024-02-29 DIAGNOSIS — Z7901 Long term (current) use of anticoagulants: Secondary | ICD-10-CM

## 2024-02-29 DIAGNOSIS — Z6830 Body mass index (BMI) 30.0-30.9, adult: Secondary | ICD-10-CM

## 2024-02-29 DIAGNOSIS — Z96643 Presence of artificial hip joint, bilateral: Secondary | ICD-10-CM | POA: Diagnosis present

## 2024-02-29 DIAGNOSIS — I872 Venous insufficiency (chronic) (peripheral): Secondary | ICD-10-CM | POA: Diagnosis present

## 2024-02-29 DIAGNOSIS — G9341 Metabolic encephalopathy: Secondary | ICD-10-CM | POA: Diagnosis present

## 2024-02-29 DIAGNOSIS — Z8249 Family history of ischemic heart disease and other diseases of the circulatory system: Secondary | ICD-10-CM

## 2024-02-29 DIAGNOSIS — E785 Hyperlipidemia, unspecified: Secondary | ICD-10-CM | POA: Diagnosis present

## 2024-02-29 DIAGNOSIS — Z87440 Personal history of urinary (tract) infections: Secondary | ICD-10-CM

## 2024-02-29 DIAGNOSIS — Z853 Personal history of malignant neoplasm of breast: Secondary | ICD-10-CM

## 2024-02-29 DIAGNOSIS — I130 Hypertensive heart and chronic kidney disease with heart failure and stage 1 through stage 4 chronic kidney disease, or unspecified chronic kidney disease: Secondary | ICD-10-CM | POA: Diagnosis present

## 2024-02-29 DIAGNOSIS — Z87891 Personal history of nicotine dependence: Secondary | ICD-10-CM

## 2024-02-29 DIAGNOSIS — M109 Gout, unspecified: Secondary | ICD-10-CM | POA: Diagnosis present

## 2024-02-29 DIAGNOSIS — Z83438 Family history of other disorder of lipoprotein metabolism and other lipidemia: Secondary | ICD-10-CM

## 2024-02-29 DIAGNOSIS — E876 Hypokalemia: Secondary | ICD-10-CM | POA: Diagnosis present

## 2024-02-29 DIAGNOSIS — Z85828 Personal history of other malignant neoplasm of skin: Secondary | ICD-10-CM

## 2024-02-29 DIAGNOSIS — E669 Obesity, unspecified: Secondary | ICD-10-CM | POA: Diagnosis present

## 2024-02-29 DIAGNOSIS — I83009 Varicose veins of unspecified lower extremity with ulcer of unspecified site: Secondary | ICD-10-CM | POA: Diagnosis present

## 2024-02-29 DIAGNOSIS — I4821 Permanent atrial fibrillation: Secondary | ICD-10-CM | POA: Diagnosis present

## 2024-02-29 DIAGNOSIS — I4891 Unspecified atrial fibrillation: Secondary | ICD-10-CM | POA: Insufficient documentation

## 2024-02-29 DIAGNOSIS — Z89421 Acquired absence of other right toe(s): Secondary | ICD-10-CM

## 2024-02-29 DIAGNOSIS — Z66 Do not resuscitate: Secondary | ICD-10-CM | POA: Diagnosis present

## 2024-02-29 DIAGNOSIS — H401134 Primary open-angle glaucoma, bilateral, indeterminate stage: Secondary | ICD-10-CM | POA: Diagnosis present

## 2024-02-29 LAB — CBC WITH DIFFERENTIAL/PLATELET
Abs Immature Granulocytes: 0.07 K/uL (ref 0.00–0.07)
Basophils Absolute: 0 K/uL (ref 0.0–0.1)
Basophils Relative: 1 %
Eosinophils Absolute: 0 K/uL (ref 0.0–0.5)
Eosinophils Relative: 0 %
HCT: 44 % (ref 36.0–46.0)
Hemoglobin: 14.7 g/dL (ref 12.0–15.0)
Immature Granulocytes: 1 %
Lymphocytes Relative: 23 %
Lymphs Abs: 1.7 K/uL (ref 0.7–4.0)
MCH: 32.2 pg (ref 26.0–34.0)
MCHC: 33.4 g/dL (ref 30.0–36.0)
MCV: 96.3 fL (ref 80.0–100.0)
Monocytes Absolute: 1.4 K/uL — ABNORMAL HIGH (ref 0.1–1.0)
Monocytes Relative: 19 %
Neutro Abs: 4.2 K/uL (ref 1.7–7.7)
Neutrophils Relative %: 56 %
Platelets: 443 K/uL — ABNORMAL HIGH (ref 150–400)
RBC: 4.57 MIL/uL (ref 3.87–5.11)
RDW: 13.2 % (ref 11.5–15.5)
WBC: 7.3 K/uL (ref 4.0–10.5)
nRBC: 0 % (ref 0.0–0.2)

## 2024-02-29 LAB — URINALYSIS, W/ REFLEX TO CULTURE (INFECTION SUSPECTED)
Bacteria, UA: NONE SEEN
Bilirubin Urine: NEGATIVE
Glucose, UA: NEGATIVE mg/dL
Hgb urine dipstick: NEGATIVE
Ketones, ur: NEGATIVE mg/dL
Leukocytes,Ua: NEGATIVE
Nitrite: NEGATIVE
Protein, ur: NEGATIVE mg/dL
Specific Gravity, Urine: 1.016 (ref 1.005–1.030)
pH: 5 (ref 5.0–8.0)

## 2024-02-29 LAB — COMPREHENSIVE METABOLIC PANEL WITH GFR
ALT: 36 U/L (ref 0–44)
AST: 45 U/L — ABNORMAL HIGH (ref 15–41)
Albumin: 3.1 g/dL — ABNORMAL LOW (ref 3.5–5.0)
Alkaline Phosphatase: 80 U/L (ref 38–126)
Anion gap: 13 (ref 5–15)
BUN: 27 mg/dL — ABNORMAL HIGH (ref 8–23)
CO2: 24 mmol/L (ref 22–32)
Calcium: 8.4 mg/dL — ABNORMAL LOW (ref 8.9–10.3)
Chloride: 98 mmol/L (ref 98–111)
Creatinine, Ser: 1.03 mg/dL — ABNORMAL HIGH (ref 0.44–1.00)
GFR, Estimated: 51 mL/min — ABNORMAL LOW
Glucose, Bld: 95 mg/dL (ref 70–99)
Potassium: 3.6 mmol/L (ref 3.5–5.1)
Sodium: 135 mmol/L (ref 135–145)
Total Bilirubin: 0.3 mg/dL (ref 0.0–1.2)
Total Protein: 6.7 g/dL (ref 6.5–8.1)

## 2024-02-29 LAB — I-STAT CG4 LACTIC ACID, ED
Lactic Acid, Venous: 1.9 mmol/L (ref 0.5–1.9)
Lactic Acid, Venous: 3.8 mmol/L (ref 0.5–1.9)

## 2024-02-29 LAB — RESP PANEL BY RT-PCR (RSV, FLU A&B, COVID)  RVPGX2
Influenza A by PCR: NEGATIVE
Influenza B by PCR: NEGATIVE
Resp Syncytial Virus by PCR: NEGATIVE
SARS Coronavirus 2 by RT PCR: POSITIVE — AB

## 2024-02-29 MED ORDER — LACTATED RINGERS IV SOLN: INTRAVENOUS | Status: DC

## 2024-02-29 MED ORDER — LACTATED RINGERS IV BOLUS
1000.0000 mL | Freq: Once | INTRAVENOUS | Status: AC
  Administered 2024-02-29: 1000 mL via INTRAVENOUS

## 2024-02-29 NOTE — ED Triage Notes (Signed)
 Pt BIBA from home, A&O x 3, hx of UTI per EMS family states pt finished here ATB's for UTI started feeling Weakne, unable to ambulate, fatigue, fevers. X 2 days.   EMS VITALS 100/78 BP CBG 160 HR 130 22 R 94% RA

## 2024-02-29 NOTE — ED Provider Notes (Signed)
 " [place] [facility] AT [facility] Provider Note   CSN: [id number] Arrival date & time: 02/29/24  1846     Patient presents with: Blood Infection and UTI   Dana Bradford is a 89 y.o. female.   89 year old female presents from home due to feeling weak.  Patient states that she has felt this way for several weeks.  Recently completed treatment for a UTI.  Denies any urinary symptoms at this time.  No recent fevers for the past 2 days according to her however nurses note does state different.  She notes that she has had a cough and shortness of breath.  Denies any chest pain.  No abdominal discomfort.  Patient states she is here because she is tired of feeling weak.       Prior to Admission medications  Medication Sig Start Date End Date Taking? Authorizing Provider  apixaban  (ELIQUIS ) 5 MG TABS tablet Take 1/2 tablet (2.5mg ) by mouth two times daily 01/22/24   Pietro Redell RAMAN, MD  cefdinir  (OMNICEF ) 300 MG capsule Take one capsule (300 mg dose) by mouth daily for 5 days. 11/15/23     Cholecalciferol  (VITAMIN D3) 50 MCG (2000 UT) TABS Take 2,000 Units by mouth daily.    [provider]  clotrimazole -betamethasone  (LOTRISONE ) cream Apply 1 Application topically daily as needed or as directed Patient taking differently: Apply 1 Application topically daily as needed (rash). 05/04/22     colchicine  0.6 MG tablet Take 1 tablet (0.6 mg total) by mouth daily. 12/28/23   Pietro Redell RAMAN, MD  famotidine  (PEPCID ) 40 MG tablet TAKE 1 TABLET NIGHTLY AS NEEDED FOR HEARTBURN 06/26/23   Almarie Waddell NOVAK, NP  ferrous sulfate  325 (65 FE) MG tablet Take 325 mg by mouth 2 (two) times daily with a meal. 07/11/17   [provider]  furosemide  (LASIX ) 40 MG tablet Take 1 tablet (40 mg total) by mouth 2 (two) times daily. 12/28/23   Pietro Redell RAMAN, MD  levothyroxine  (SYNTHROID ) 88 MCG tablet Take 1 tablet (88 mcg total) by mouth daily before breakfast. 06/26/23   Almarie Waddell NOVAK, NP  metFORMIN  (GLUCOPHAGE -XR) 500 MG 24 hr tablet Take 1 tablet (500 mg total) by mouth at bedtime. 06/26/23   Almarie Waddell NOVAK, NP  metolazone  (ZAROXOLYN ) 2.5 MG tablet Please take this medication every 5 days. Start taking tomorrow. 12/28/23   Pietro Redell RAMAN, MD  metoprolol  succinate (TOPROL -XL) 50 MG 24 hr tablet Take 1 tablet (50 mg total) by mouth See admin instructions. 1 TABLET AM AND 1/2 TAB PM 12/28/23   Pietro Redell RAMAN, MD  nystatin  cream (MYCOSTATIN ) Apply 1 Application topically 2 (two) times daily. 06/26/23   Almarie Waddell NOVAK, NP  omeprazole  (PRILOSEC) 40 MG capsule Take 1 capsule (40 mg total) by mouth daily. 06/26/23   Almarie Waddell NOVAK, NP  potassium chloride  SA (KLOR-CON  M20) 20 MEQ tablet Take 1 tablet (20 mEq total) by mouth daily. 12/28/23   Pietro Redell RAMAN, MD  pramipexole  (MIRAPEX ) 1 MG tablet Take 1 tablet (1 mg total) by mouth every evening. 06/26/23   Almarie Waddell NOVAK, NP  pravastatin  (PRAVACHOL ) 20 MG tablet Take 1 tablet (20 mg total) by mouth daily. 12/28/23   Pietro Redell RAMAN, MD  sulfamethoxazole -trimethoprim  (BACTRIM ) 400-80 MG tablet Take 1/2 tablet by mouth, 3 times weekly. 06/26/23   Almarie Waddell NOVAK, NP  timolol  (TIMOPTIC ) 0.5 % ophthalmic solution Place 1 drop into both eyes daily. 01/09/23   [provider]  triamcinolone  ointment (KENALOG ) 0.1 % Apply 1 application topically 2 (two) times daily as needed (skin irritation.).    [provider]  vitamin C (ASCORBIC ACID ) 500 MG tablet Take 500 mg by mouth 3 (three) times a week. Monday , Wednesday and Friday    [provider]    Allergies: Arthrotec [diclofenac -misoprostol], Doxycycline, Latex, Amlodipine, Celebrex [celecoxib], Vancomycin , Diclofenac , and Shellfish allergy    Review of Systems  All other systems reviewed and are negative.   Updated Vital Signs BP 102/70 (BP Location: Right Arm)   Pulse (!) 128   Temp 97.6 F (36.4 C) (Oral)   Resp (!) 22   Ht 1.473 m (4' 10)   Wt 66 kg    SpO2 97%   BMI 30.41 kg/m   Physical Exam Vitals and nursing note reviewed.  Constitutional:      General: She is not in acute distress.    Appearance: Normal appearance. She is well-developed. She is not toxic-appearing.  HENT:     Head: Normocephalic and atraumatic.  Eyes:     General: Lids are normal.     Conjunctiva/sclera: Conjunctivae normal.     Pupils: Pupils are equal, round, and reactive to light.  Neck:     Thyroid : No thyroid  mass.     Trachea: No tracheal deviation.  Cardiovascular:     Rate and Rhythm: Regular rhythm. Tachycardia present.     Heart sounds: Normal heart sounds. No murmur heard.    No gallop.  Pulmonary:     Effort: Pulmonary effort is normal. No respiratory distress.     Breath sounds: Normal breath sounds. No stridor. No decreased breath sounds, wheezing, rhonchi or rales.  Abdominal:     General: There is no distension.     Palpations: Abdomen is soft.     Tenderness: There is no abdominal tenderness. There is no rebound.  Musculoskeletal:        General: No tenderness. Normal range of motion.     Cervical back: Normal range of motion and neck supple.  Skin:    General: Skin is warm and dry.     Findings: No abrasion or rash.  Neurological:     Mental Status: She is alert and oriented to person, place, and time. Mental status is at baseline.     GCS: GCS eye subscore is 4. GCS verbal subscore is 5. GCS motor subscore is 6.     Cranial Nerves: No cranial nerve deficit.     Sensory: No sensory deficit.     Motor: Motor function is intact.  Psychiatric:        Attention and Perception: Attention normal.        Speech: Speech normal.        Behavior: Behavior normal.     (all labs ordered are listed, but only abnormal results are displayed) Labs Reviewed  CBC WITH DIFFERENTIAL/PLATELET - Abnormal; Notable for the following components:      Result Value   Platelets 443 (*)    Monocytes Absolute 1.4 (*)    All other components within  normal limits  RESP PANEL BY RT-PCR (RSV, FLU A&B, COVID)  [id number]  URINALYSIS, W/ REFLEX TO CULTURE (INFECTION SUSPECTED)  COMPREHENSIVE METABOLIC PANEL WITH GFR  I-STAT CG4 LACTIC ACID, ED    EKG: EKG Interpretation Date/Time:  Thursday February 29 2024 18:58:52 EST Ventricular Rate:  107 PR Interval:    QRS Duration:  87 QT Interval:  358 QTC Calculation: 478 R Axis:  138  Text Interpretation: Atrial fibrillation Right axis deviation Abnormal R-wave progression, early transition Nonspecific T abnormalities, diffuse leads Confirmed by Dasie Faden ([id number]) on 02/29/2024 7:19:33 PM  Radiology: No results found.   Procedures   Medications Ordered in the ED  lactated ringers  bolus 1,000 mL (has no administration in time range)  lactated ringers  infusion (has no administration in time range)                                    Medical Decision Making Amount and/or Complexity of Data Reviewed Labs: ordered. Radiology: ordered. ECG/medicine tests: ordered.  Risk Prescription drug management.  EKG shows A-fib. Chest x-ray without acute findings. Patient is urinalysis is normal at this time.  She is COVID-positive.  She was here reason because of diffuse weakness.  Patient has been given IV fluids.  Will require admission.     Final diagnoses:  None    ED Discharge Orders     None          Dasie Faden, MD 02/29/24 2201  "

## 2024-02-29 NOTE — H&P (Signed)
 " History and Physical    Dana Bradford FMW:[id number] DOB: 01/10/1931 DOA: 02/29/2024  Patient coming from: Home.  Chief Complaint: Weakness and confusion and cough.  HPI: Dana Bradford is a 89 y.o. female with history of atrial fibrillation, chronic diastolic CHF, chronic venous stasis, diabetes mellitus type 2, history of breast cancer status postlumpectomy, hypothyroidism, recently placed on Neurontin  by primary care for neuropathy was brought to the ER after patient has been having fever chills and nonproductive cough with weakness and confusion last 2 days with difficulty ambulating.  Prior to this patient was treated for UTI.  Denies any chest pain nausea vomiting or diarrhea but has been having poor oral intake and has not taken some of her cardiac medication including metoprolol  last 2 days.  ED Course: In the ER patient is found to be in A-fib with RVR heart rate around 120 bpm.  Patient appears generally weak and dehydrated was given fluid bolus lactic acid has actually worsened after the fluid bolus was given another fluid bolus.  WBC is 7.3 hemoglobin 14.7 chest x-ray unremarkable CT head is not showing anything acute.  Creatinine is 1.03 AST 45.  Patient's COVID test came back positive.  Patient is not hypoxic.  Patient admitted for further observation.  Review of Systems: As per HPI, rest all negative.   Past Medical History:  Diagnosis Date   Acute CHF (congestive heart failure) (HCC) 04/22/2020   Acute kidney failure 2016 to 2017 due to vacomycin   no kidney issues now   Anemia    Antibiotic-induced nephrotoxicity 05/26/2020   Anxiety    ARF (acute renal failure) 04/22/2020   Arthritis    Atrial fibrillation (HCC)    CHF (congestive heart failure) (HCC) 04/22/2020   CHF exacerbation (HCC) 04/23/2020   Chronic venous insufficiency 03/01/2019   Last Assessment & Plan:   Formatting of this note might be different from the original.  CVI:       Frequent leg elevation above  heart level for 30 minutes each session throughout the day     Controlling swelling is lifelong goal to reduce risk of recurrent venous leg ulcers.       Avoid sodium and processed foods to reduce swelling.      Lifelong compression advised once RLE dermatitis is resolve   Controlled type 2 diabetes mellitus with hyperglycemia, without long-term current use of insulin  (HCC) 10/18/2017   Degeneration of lumbar intervertebral disc 12/02/2019   Degenerative spondylolisthesis 12/02/2019   Depression    Diabetes mellitus without complication (HCC)    Diabetic ulcer of toe of right foot associated with type 2 diabetes mellitus, with fat layer exposed (HCC) 02/08/2019   Last Assessment & Plan:   Formatting of this note is different from the original.  Patient tolerated selective debridement as above. Left dorsum foot ulcer making good progress toward closure.      According to recent blood flow studys patient bloodflow is adequate for healing, bilaterally.      We have reinforced purpose and importance of complete offloading and glycemic control.     Wound care p   Dyspnea    Essential hypertension, benign 06/17/2012   Food allergy    Fish (Hives)    Frequent UTI 06/25/2021   GERD (gastroesophageal reflux disease) 11/13/2012   Glaucoma    Hollenhorst plaque, right eye 10/18/2017   Hypertension    Hypotension 01/19/2020   Hypothyroidism 01/19/2020   Impacted cerumen, bilateral 08/31/2022   Impaired fasting glucose  Infection and inflammatory reaction due to other internal joint prosthesis, initial encounter 08/08/2014   Infection and inflammatory reaction due to other internal joint prosthesis, initial encounter 08/08/2014   Infection of prosthetic right knee joint 07/23/2014   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 macular degeneration of left eye 10/18/2017   Left leg cellulitis 01/18/2020   Lymphedema of both lower extremities 12/26/2014   Malignant neoplasm of overlapping sites of left  female breast (HCC) 09/26/2022   OA (osteoarthritis) of hip 02/28/2018   Osteopenia 08/26/2021   Osteoporosis 09/16/2021   Other specified acquired hypothyroidism    Pain in joint of right hip 02/06/2018   Pain in left knee 11/20/2019   Permanent atrial fibrillation (HCC)    Pneumonia    Post-menopause 08/26/2021   Primary open angle glaucoma (POAG) of both eyes, indeterminate stage 10/18/2017   Restless leg    Restless leg    Restless leg syndrome 06/05/2012   Retinal artery branch occlusion of right eye 06/12/2017   Last Assessment & Plan:   Formatting of this note might be different from the original.  Relevant Hx:  Course:  Daily Update:  Today's Plan:     Right ankle pain 08/26/2021   Scoliosis deformity of spine 12/02/2019   Skin cancer    Forehead   Stage 3b chronic kidney disease (HCC) 02/23/2021   Status post amputation of lesser toe of right foot 05/13/2017   Formatting of this note might be different from the original. 05/13/17 Op note-Mrs. Salada is a very pleasant 89y/o female who presented to the ED with infection to her right foot. Xrays and MRI concerning for acute osteomyelitis of right 5th toe.Proceed with amputation of right 5th toe to help ensure quick resolution of infection to help prevent progression.     Stroke (HCC) 05/2017   ocular stoke on plavix  , right eye some vision loss   Vaginal vault prolapse 08/12/2014   Venous hypertension of both lower extremities 12/26/2014   Venous stasis dermatitis of left lower extremity 06/05/2018   Venous stasis ulcer (HCC)     Past Surgical History:  Procedure Laterality Date   ABDOMINAL HYSTERECTOMY  1993   Uterine Prolapse partial   BREAST BIOPSY Left 08/26/2022   MM LT BREAST BX W LOC DEV 1ST LESION IMAGE BX SPEC STEREO GUIDE 08/26/2022 [facility]   BREAST BIOPSY Left 08/26/2022   US  LT BREAST BX W LOC DEV 1ST LESION IMG BX SPEC US  GUIDE 08/26/2022 [facility]   BREAST BIOPSY  10/25/2022   MM LT RADIOACTIVE  SEED LOC MAMMO GUIDE 10/25/2022 [facility]   BREAST LUMPECTOMY WITH RADIOACTIVE SEED LOCALIZATION Left 10/27/2022   Procedure: LEFT BREAST LUMPECTOMY WITH RADIOACTIVE SEED LOCALIZATION;  Surgeon: Vernetta Berg, MD;  Location: [facility];  Service: General;  Laterality: Left;  LMA   MASTECTOMY  1974   right breast not sure if cancer 1960's   REPLACEMENT TOTAL KNEE BILATERAL Bilateral    SMALL TOE RIGHT FOOT REMOVED  04/2017   DUE TO INFECTION   Spleenectomy  2000   TOTAL HIP ARTHROPLASTY Right 02/28/2018   Procedure: RIGHT TOTAL HIP ARTHROPLASTY ANTERIOR APPROACH;  Surgeon: Melodi Lerner, MD;  Location: [facility];  Service: Orthopedics;  Laterality: Right;    TOTAL HIP ARTHROPLASTY Left 01/22/2020   Procedure: TOTAL HIP ARTHROPLASTY ANTERIOR APPROACH;  Surgeon: Melodi Lerner, MD;  Location: [facility];  Service: Orthopedics;  Laterality: Left;    TOTAL KNEE ARTHROPLASTY WITH REVISION COMPONENTS  reports that she quit smoking about 52 years ago. Her smoking use included cigarettes. She started smoking about 82 years ago. She has a 30 pack-year smoking history. She has been exposed to tobacco smoke. She has never used smokeless tobacco. She reports that she does not currently use alcohol . She reports that she does not use drugs.  Allergies[1]  Family History  Problem Relation Age of Onset   Cancer Mother    Heart disease Father    Hyperlipidemia Father    Aneurysm Sister    Kidney disease Brother     Prior to Admission medications  Medication Sig Start Date End Date Taking? Authorizing Provider  apixaban  (ELIQUIS ) 5 MG TABS tablet Take 1/2 tablet (2.5mg ) by mouth two times daily 01/22/24  Yes Crenshaw, Redell RAMAN, MD  Cholecalciferol  (VITAMIN D3) 50 MCG (2000 UT) TABS Take 2,000 Units by mouth daily.   Yes [provider]  colchicine  0.6 MG tablet Take 1 tablet (0.6 mg total) by mouth daily. 12/28/23  Yes Pietro Redell RAMAN, MD  famotidine  (PEPCID )  40 MG tablet TAKE 1 TABLET NIGHTLY AS NEEDED FOR HEARTBURN Patient taking differently: Take 40 mg by mouth daily. 06/26/23  Yes Almarie Waddell NOVAK, NP  ferrous sulfate  325 (65 FE) MG tablet Take 325 mg by mouth 2 (two) times daily with a meal. 07/11/17  Yes [provider]  furosemide  (LASIX ) 40 MG tablet Take 1 tablet (40 mg total) by mouth 2 (two) times daily. Patient taking differently: Take 40 mg by mouth See admin instructions. Take 1 tablet daily and Take 1 tablet in evening PRN 12/28/23  Yes Crenshaw, Redell RAMAN, MD  gabapentin  (NEURONTIN ) 100 MG capsule Take 200 mg by mouth 2 (two) times daily. 02/20/24 02/19/25 Yes [provider]  levothyroxine  (SYNTHROID ) 88 MCG tablet Take 1 tablet (88 mcg total) by mouth daily before breakfast. 06/26/23  Yes Almarie Waddell NOVAK, NP  metFORMIN  (GLUCOPHAGE -XR) 500 MG 24 hr tablet Take 1 tablet (500 mg total) by mouth at bedtime. 06/26/23  Yes Almarie Waddell B, NP  metolazone  (ZAROXOLYN ) 2.5 MG tablet Please take this medication every 5 days. Start taking tomorrow. Patient taking differently: Take 2.5 mg by mouth once a week. 12/28/23  Yes Pietro Redell RAMAN, MD  metoprolol  succinate (TOPROL -XL) 50 MG 24 hr tablet Take 1 tablet (50 mg total) by mouth See admin instructions. 1 TABLET AM AND 1/2 TAB PM 12/28/23  Yes Crenshaw, Redell RAMAN, MD  omeprazole  (PRILOSEC) 40 MG capsule Take 1 capsule (40 mg total) by mouth daily. 06/26/23  Yes Almarie Waddell NOVAK, NP  potassium chloride  SA (KLOR-CON  M20) 20 MEQ tablet Take 1 tablet (20 mEq total) by mouth daily. 12/28/23  Yes Pietro Redell RAMAN, MD  pramipexole  (MIRAPEX ) 1 MG tablet Take 1 tablet (1 mg total) by mouth every evening. 06/26/23  Yes Almarie Waddell NOVAK, NP  pravastatin  (PRAVACHOL ) 20 MG tablet Take 1 tablet (20 mg total) by mouth daily. 12/28/23  Yes Pietro Redell RAMAN, MD  triamcinolone  ointment (KENALOG ) 0.1 % Apply 1 application topically 2 (two) times daily as needed (skin irritation.).   Yes [provider]  vitamin C  (ASCORBIC ACID ) 500 MG tablet Take 500 mg by mouth 3 (three) times a week. Monday , Wednesday and Friday   Yes [provider]  cefdinir  (OMNICEF ) 300 MG capsule Take one capsule (300 mg dose) by mouth daily for 5 days. 11/15/23     clotrimazole -betamethasone  (LOTRISONE ) cream Apply 1 Application topically daily as needed or  as directed Patient taking differently: Apply 1 Application topically daily as needed (rash). 05/04/22     nystatin  cream (MYCOSTATIN ) Apply 1 Application topically 2 (two) times daily. Patient not taking: Reported on 02/29/2024 06/26/23   Almarie Waddell NOVAK, NP  sulfamethoxazole -trimethoprim  (BACTRIM ) 400-80 MG tablet Take 1/2 tablet by mouth, 3 times weekly. Patient not taking: Reported on 02/29/2024 06/26/23   Almarie Waddell NOVAK, NP    Physical Exam: Constitutional: Moderately built and nourished. Vitals:   02/29/24 2045 02/29/24 2145 02/29/24 2200 02/29/24 2251  BP: 100/76 112/86 112/82   Pulse: (!) 122 (!) 121 (!) 111   Resp: 16 16 16    Temp:    98 F (36.7 C)  TempSrc:      SpO2: 93% 93% 94%   Weight:      Height:       Eyes: Anicteric no pallor. ENMT: No discharge from the ears eyes nose or mouth. Neck: No mass felt.  No neck rigidity. Respiratory: No rhonchi or crepitations. Cardiovascular: S1-S2 heard. Abdomen: Soft nontender bowel sound present. Musculoskeletal: Chronic skin changes. Skin: Venous stasis ulcer on the right lateral aspect of the leg. Neurologic: Alert awake oriented to name and place.  Moving all extremities. Psychiatric: Oriented name and place.   Labs on Admission: I have personally reviewed following labs and imaging studies  CBC: Recent Labs  Lab 02/29/24 1911  WBC 7.3  NEUTROABS 4.2  HGB 14.7  HCT 44.0  MCV 96.3  PLT 443*   Basic Metabolic Panel: Recent Labs  Lab 02/29/24 1953  NA 135  K 3.6  CL 98  CO2 24  GLUCOSE 95  BUN 27*  CREATININE 1.03*  CALCIUM  8.4*   GFR: Estimated Creatinine Clearance: 28.6 mL/min (A)  (by C-G formula based on SCr of 1.03 mg/dL (H)). Liver Function Tests: Recent Labs  Lab 02/29/24 1953  AST 45*  ALT 36  ALKPHOS 80  BILITOT 0.3  PROT 6.7  ALBUMIN  3.1*   No results for input(s): LIPASE, AMYLASE in the last 168 hours. No results for input(s): AMMONIA in the last 168 hours. Coagulation Profile: No results for input(s): INR, PROTIME in the last 168 hours. Cardiac Enzymes: No results for input(s): CKTOTAL, CKMB, CKMBINDEX, TROPONINI in the last 168 hours. BNP (last 3 results) Recent Labs    05/03/23 1602  PROBNP 3,248*   HbA1C: No results for input(s): HGBA1C in the last 72 hours. CBG: No results for input(s): GLUCAP in the last 168 hours. Lipid Profile: No results for input(s): CHOL, HDL, LDLCALC, TRIG, CHOLHDL, LDLDIRECT in the last 72 hours. Thyroid  Function Tests: No results for input(s): TSH, T4TOTAL, FREET4, T3FREE, THYROIDAB in the last 72 hours. Anemia Panel: No results for input(s): VITAMINB12, FOLATE, FERRITIN, TIBC, IRON, RETICCTPCT in the last 72 hours. Urine analysis:    Component Value Date/Time   COLORURINE YELLOW 02/29/2024 2023   APPEARANCEUR HAZY (A) 02/29/2024 2023   LABSPEC 1.016 02/29/2024 2023   PHURINE 5.0 02/29/2024 2023   GLUCOSEU NEGATIVE 02/29/2024 2023   HGBUR NEGATIVE 02/29/2024 2023   BILIRUBINUR NEGATIVE 02/29/2024 2023   BILIRUBINUR negative 12/09/2020 1509   KETONESUR NEGATIVE 02/29/2024 2023   PROTEINUR NEGATIVE 02/29/2024 2023   UROBILINOGEN 0.2 12/09/2020 1509   NITRITE NEGATIVE 02/29/2024 2023   LEUKOCYTESUR NEGATIVE 02/29/2024 2023   Sepsis Labs: @LABRCNTIP (procalcitonin:4,lacticidven:4) ) Recent Results (from the past 240 hours)  Resp panel by RT-PCR (RSV, Flu A&B, Covid) Anterior Nasal Swab     Status: Abnormal   Collection Time: 02/29/24  8:08 PM   Specimen: Anterior Nasal Swab  Result Value Ref Range Status   SARS Coronavirus 2 by RT PCR POSITIVE  (A) NEGATIVE Final    Comment: (NOTE) SARS-CoV-2 target nucleic acids are DETECTED.  The SARS-CoV-2 RNA is generally detectable in upper respiratory specimens during the acute phase of infection. Positive results are indicative of the presence of the identified virus, but do not rule out bacterial infection or co-infection with other pathogens not detected by the test. Clinical correlation with patient history and other diagnostic information is necessary to determine patient infection status. The expected result is Negative.  Fact Sheet for Patients: bloggercourse.com  Fact Sheet for Healthcare Providers: seriousbroker.it  This test is not yet approved or cleared by the [place]  FDA and  has been authorized for detection and/or diagnosis of SARS-CoV-2 by FDA under an Emergency Use Authorization (EUA).  This EUA will remain in effect (meaning this test can be used) for the duration of  the COVID-19 declaration under Section 564(b)(1) of the A ct, 21 U.S.C. section 360bbb-3(b)(1), unless the authorization is terminated or revoked sooner.     Influenza A by PCR NEGATIVE NEGATIVE Final   Influenza B by PCR NEGATIVE NEGATIVE Final    Comment: (NOTE) The Xpert Xpress SARS-CoV-2/FLU/RSV plus assay is intended as an aid in the diagnosis of influenza from Nasopharyngeal swab specimens and should not be used as a sole basis for treatment. Nasal washings and aspirates are unacceptable for Xpert Xpress SARS-CoV-2/FLU/RSV testing.  Fact Sheet for Patients: bloggercourse.com  Fact Sheet for Healthcare Providers: seriousbroker.it  This test is not yet approved or cleared by the [place]  FDA and has been authorized for detection and/or diagnosis of SARS-CoV-2 by FDA under an Emergency Use Authorization (EUA). This EUA will remain in effect (meaning this test can be used) for the  duration of the COVID-19 declaration under Section 564(b)(1) of the Act, 21 U.S.C. section 360bbb-3(b)(1), unless the authorization is terminated or revoked.     Resp Syncytial Virus by PCR NEGATIVE NEGATIVE Final    Comment: (NOTE) Fact Sheet for Patients: bloggercourse.com  Fact Sheet for Healthcare Providers: seriousbroker.it  This test is not yet approved or cleared by the [place]  FDA and has been authorized for detection and/or diagnosis of SARS-CoV-2 by FDA under an Emergency Use Authorization (EUA). This EUA will remain in effect (meaning this test can be used) for the duration of the COVID-19 declaration under Section 564(b)(1) of the Act, 21 U.S.C. section 360bbb-3(b)(1), unless the authorization is terminated or revoked.  Performed at [facility], [address], [place], [place] [address]      Radiological Exams on Admission: CT HEAD WO CONTRAST ( ) Result Date: 02/29/2024 CLINICAL DATA:  Mental status change EXAM: CT HEAD WITHOUT CONTRAST TECHNIQUE: Contiguous axial images were obtained from the base of the skull through the vertex without intravenous contrast. RADIATION DOSE REDUCTION: This exam was performed according to the departmental dose-optimization program which includes automated exposure control, adjustment of the mA and/or kV according to patient size and/or use of iterative reconstruction technique. COMPARISON:  MRI report 06/20/2017 FINDINGS: Brain: No acute territorial infarction, hemorrhage or intracranial mass. Small chronic left cerebellar infarct. Mild atrophy and moderate chronic small vessel ischemic changes of the white matter. The ventricles are nonenlarged Vascular: No hyperdense vessels. Vertebral and carotid vascular calcification Skull: Normal. Negative for fracture or focal lesion. Sinuses/Orbits: No acute finding. Other: None IMPRESSION: 1. No CT evidence for acute  intracranial abnormality.  2. Atrophy and chronic small vessel ischemic changes of the white matter. Small chronic left cerebellar infarct. Electronically Signed   By: Luke Bun M.D.   On: 02/29/2024 23:19   DG Chest Port 1 View Result Date: 02/29/2024 EXAM: 1 VIEW XRAY OF THE CHEST 02/29/2024 07:35:00 PM COMPARISON: Comparison with 03/25/2020. CLINICAL HISTORY: Cough, weakness, fatigue, and fever for 2 days. FINDINGS: LUNGS AND PLEURA: Shallow inspiration. Lungs are clear. No pleural effusion. No pneumothorax. HEART AND MEDIASTINUM: Normal heart size and pulmonary vascularity. Calcification of the aorta. BONES AND SOFT TISSUES: Surgical clips in the left upper quadrant. No acute osseous abnormality. IMPRESSION: 1. No acute cardiopulmonary pathology. Electronically signed by: Elsie Gravely MD 02/29/2024 07:38 PM EST RP Workstation: [id number]    EKG: Independently reviewed.  A-fib with RVR.  Assessment/Plan Principal Problem:   COVID-19    A-fib with RVR likely from patient not taking her medication last 2 days due to poor oral intake due to COVID-19 infection.  Will restart her p.o. metoprolol  home dose.  Check TSH.  Continue Eliquis  for anticoagulation. COVID-19 infection presently not hypoxic.  Chest x-ray not showing anything acute.  Will closely monitor respiratory status and inflammatory markers. Generalized weakness and acute metabolic encephalopathy likely from COVID-19 infection and dehydration.  CT head unremarkable patient appears nonfocal.  Patient received fluids in the ER.  Will closely monitor.  Recheck lactic acid. Diabetes mellitus type 2 will keep patient on sliding scale coverage.  Takes metformin  at home.  Check hemoglobin A1c. History of chronic HFpEF appears clinically dehydrated at this time was receiving fluids.  Last EF measured in February 2024 was 55 to 60%. Hypothyroidism on Synthroid .  Check TSH. Chronic kidney disease stage III creatinine at around  baseline. Restless leg syndrome on pramipexole . History of breast cancer status postlumpectomy being followed by oncologist. Venous stasis ulcer on the right leg.  Wound team consult. Hyperlipidemia on statins. GERD on PPI. Peripheral neuropathy on gabapentin  which was recently started.  Will decrease the dose given the chronic kidney disease and mild confusion. History of gout on colchicine .  DVT prophylaxis: Eliquis . Code Status: DNR confirmed with patient's son and daughter at the bedside. Family Communication: Patient's son and daughter at the bedside. Disposition Plan: Monitored bed. Consults called: Physical therapy. Admission status: Observation.         [1]  Allergies Allergen Reactions   Arthrotec [Diclofenac -Misoprostol] Shortness Of Breath and Diarrhea   Doxycycline Nausea And Vomiting and Rash    Significant enough to d/c treatment and refuse to take more Vomiting (intolerance) The first time she took it she had N/V and the second she had a rash.      Latex Rash and Other (See Comments)   Amlodipine     angioedema   Celebrex [Celecoxib] Other (See Comments)    GI Bleeding   Vancomycin      Acute kidney failure   Diclofenac  Diarrhea   Shellfish Allergy Itching and Rash   "

## 2024-03-01 ENCOUNTER — Encounter (HOSPITAL_COMMUNITY): Payer: Self-pay | Admitting: Internal Medicine

## 2024-03-01 ENCOUNTER — Inpatient Hospital Stay (HOSPITAL_COMMUNITY)

## 2024-03-01 DIAGNOSIS — Z66 Do not resuscitate: Secondary | ICD-10-CM | POA: Diagnosis present

## 2024-03-01 DIAGNOSIS — G9341 Metabolic encephalopathy: Secondary | ICD-10-CM | POA: Diagnosis present

## 2024-03-01 DIAGNOSIS — H401134 Primary open-angle glaucoma, bilateral, indeterminate stage: Secondary | ICD-10-CM | POA: Diagnosis present

## 2024-03-01 DIAGNOSIS — G2581 Restless legs syndrome: Secondary | ICD-10-CM | POA: Diagnosis present

## 2024-03-01 DIAGNOSIS — K219 Gastro-esophageal reflux disease without esophagitis: Secondary | ICD-10-CM | POA: Diagnosis present

## 2024-03-01 DIAGNOSIS — I4821 Permanent atrial fibrillation: Secondary | ICD-10-CM | POA: Diagnosis present

## 2024-03-01 DIAGNOSIS — M109 Gout, unspecified: Secondary | ICD-10-CM | POA: Insufficient documentation

## 2024-03-01 DIAGNOSIS — E1122 Type 2 diabetes mellitus with diabetic chronic kidney disease: Secondary | ICD-10-CM | POA: Diagnosis present

## 2024-03-01 DIAGNOSIS — I872 Venous insufficiency (chronic) (peripheral): Secondary | ICD-10-CM | POA: Diagnosis present

## 2024-03-01 DIAGNOSIS — E039 Hypothyroidism, unspecified: Secondary | ICD-10-CM | POA: Diagnosis present

## 2024-03-01 DIAGNOSIS — I130 Hypertensive heart and chronic kidney disease with heart failure and stage 1 through stage 4 chronic kidney disease, or unspecified chronic kidney disease: Secondary | ICD-10-CM | POA: Diagnosis present

## 2024-03-01 DIAGNOSIS — Z7901 Long term (current) use of anticoagulants: Secondary | ICD-10-CM | POA: Diagnosis not present

## 2024-03-01 DIAGNOSIS — E871 Hypo-osmolality and hyponatremia: Secondary | ICD-10-CM | POA: Diagnosis present

## 2024-03-01 DIAGNOSIS — E1142 Type 2 diabetes mellitus with diabetic polyneuropathy: Secondary | ICD-10-CM | POA: Diagnosis present

## 2024-03-01 DIAGNOSIS — F0390 Unspecified dementia without behavioral disturbance: Secondary | ICD-10-CM | POA: Diagnosis present

## 2024-03-01 DIAGNOSIS — N1832 Chronic kidney disease, stage 3b: Secondary | ICD-10-CM | POA: Diagnosis present

## 2024-03-01 DIAGNOSIS — U071 COVID-19: Secondary | ICD-10-CM | POA: Diagnosis present

## 2024-03-01 DIAGNOSIS — I4891 Unspecified atrial fibrillation: Secondary | ICD-10-CM | POA: Insufficient documentation

## 2024-03-01 DIAGNOSIS — I5032 Chronic diastolic (congestive) heart failure: Secondary | ICD-10-CM | POA: Diagnosis present

## 2024-03-01 DIAGNOSIS — I83013 Varicose veins of right lower extremity with ulcer of ankle: Secondary | ICD-10-CM | POA: Diagnosis present

## 2024-03-01 DIAGNOSIS — E860 Dehydration: Secondary | ICD-10-CM | POA: Diagnosis present

## 2024-03-01 DIAGNOSIS — E872 Acidosis, unspecified: Secondary | ICD-10-CM | POA: Diagnosis present

## 2024-03-01 DIAGNOSIS — E785 Hyperlipidemia, unspecified: Secondary | ICD-10-CM | POA: Diagnosis present

## 2024-03-01 DIAGNOSIS — E669 Obesity, unspecified: Secondary | ICD-10-CM | POA: Diagnosis present

## 2024-03-01 DIAGNOSIS — E11621 Type 2 diabetes mellitus with foot ulcer: Secondary | ICD-10-CM | POA: Diagnosis present

## 2024-03-01 DIAGNOSIS — E876 Hypokalemia: Secondary | ICD-10-CM | POA: Diagnosis present

## 2024-03-01 LAB — C-REACTIVE PROTEIN: CRP: 3 mg/dL — ABNORMAL HIGH

## 2024-03-01 LAB — CBC
HCT: 39.2 % (ref 36.0–46.0)
Hemoglobin: 12.9 g/dL (ref 12.0–15.0)
MCH: 31.8 pg (ref 26.0–34.0)
MCHC: 32.9 g/dL (ref 30.0–36.0)
MCV: 96.6 fL (ref 80.0–100.0)
Platelets: 378 K/uL (ref 150–400)
RBC: 4.06 MIL/uL (ref 3.87–5.11)
RDW: 13.2 % (ref 11.5–15.5)
WBC: 6.2 K/uL (ref 4.0–10.5)
nRBC: 0 % (ref 0.0–0.2)

## 2024-03-01 LAB — D-DIMER, QUANTITATIVE: D-Dimer, Quant: 1.5 ug{FEU}/mL — ABNORMAL HIGH (ref 0.00–0.50)

## 2024-03-01 LAB — COMPREHENSIVE METABOLIC PANEL WITH GFR
ALT: 31 U/L (ref 0–44)
AST: 39 U/L (ref 15–41)
Albumin: 3 g/dL — ABNORMAL LOW (ref 3.5–5.0)
Alkaline Phosphatase: 68 U/L (ref 38–126)
Anion gap: 13 (ref 5–15)
BUN: 20 mg/dL (ref 8–23)
CO2: 24 mmol/L (ref 22–32)
Calcium: 7.8 mg/dL — ABNORMAL LOW (ref 8.9–10.3)
Chloride: 100 mmol/L (ref 98–111)
Creatinine, Ser: 0.79 mg/dL (ref 0.44–1.00)
GFR, Estimated: >60 mL/min
Glucose, Bld: 74 mg/dL (ref 70–99)
Potassium: 3.4 mmol/L — ABNORMAL LOW (ref 3.5–5.1)
Sodium: 137 mmol/L (ref 135–145)
Total Bilirubin: 0.3 mg/dL (ref 0.0–1.2)
Total Protein: 5.8 g/dL — ABNORMAL LOW (ref 6.5–8.1)

## 2024-03-01 LAB — LACTIC ACID, PLASMA: Lactic Acid, Venous: 1.1 mmol/L (ref 0.5–1.9)

## 2024-03-01 LAB — GLUCOSE, CAPILLARY
Glucose-Capillary: 119 mg/dL — ABNORMAL HIGH (ref 70–99)
Glucose-Capillary: 135 mg/dL — ABNORMAL HIGH (ref 70–99)

## 2024-03-01 LAB — HEMOGLOBIN A1C
Hgb A1c MFr Bld: 6.2 % — ABNORMAL HIGH (ref 4.8–5.6)
Mean Plasma Glucose: 131.24 mg/dL

## 2024-03-01 LAB — TSH: TSH: 0.449 u[IU]/mL (ref 0.350–4.500)

## 2024-03-01 LAB — CBG MONITORING, ED
Glucose-Capillary: 68 mg/dL — ABNORMAL LOW (ref 70–99)
Glucose-Capillary: 113 mg/dL — ABNORMAL HIGH (ref 70–99)

## 2024-03-01 MED ORDER — METOPROLOL SUCCINATE ER 25 MG PO TB24
25.0000 mg | ORAL_TABLET | Freq: Every day | ORAL | Status: DC
  Administered 2024-03-01 – 2024-03-04 (×4): 25 mg via ORAL
  Filled 2024-03-01 (×4): qty 1

## 2024-03-01 MED ORDER — APIXABAN 2.5 MG PO TABS
2.5000 mg | ORAL_TABLET | Freq: Two times a day (BID) | ORAL | Status: DC
  Administered 2024-03-01 – 2024-03-05 (×10): 2.5 mg via ORAL
  Filled 2024-03-01 (×11): qty 1

## 2024-03-01 MED ORDER — ACETAMINOPHEN 500 MG PO TABS
1000.0000 mg | ORAL_TABLET | Freq: Three times a day (TID) | ORAL | Status: DC
  Administered 2024-03-01 – 2024-03-05 (×9): 1000 mg via ORAL
  Filled 2024-03-01 (×11): qty 2

## 2024-03-01 MED ORDER — DM-GUAIFENESIN ER 30-600 MG PO TB12
1.0000 | ORAL_TABLET | Freq: Two times a day (BID) | ORAL | Status: DC
  Administered 2024-03-01 – 2024-03-05 (×7): 1 via ORAL
  Filled 2024-03-01 (×8): qty 1

## 2024-03-01 MED ORDER — COLCHICINE 0.6 MG PO TABS
0.6000 mg | ORAL_TABLET | Freq: Every day | ORAL | Status: DC
  Administered 2024-03-01 – 2024-03-05 (×4): 0.6 mg via ORAL
  Filled 2024-03-01 (×5): qty 1

## 2024-03-01 MED ORDER — METOPROLOL SUCCINATE ER 50 MG PO TB24
50.0000 mg | ORAL_TABLET | Freq: Every day | ORAL | Status: DC
  Administered 2024-03-01 – 2024-03-02 (×2): 50 mg via ORAL
  Filled 2024-03-01 (×3): qty 1

## 2024-03-01 MED ORDER — ACETAMINOPHEN 650 MG RE SUPP: 650.0000 mg | Freq: Four times a day (QID) | RECTAL | Status: DC | PRN

## 2024-03-01 MED ORDER — INSULIN ASPART 100 UNIT/ML IJ SOLN
0.0000 [IU] | Freq: Three times a day (TID) | INTRAMUSCULAR | Status: DC
  Administered 2024-03-04 – 2024-03-05 (×2): 2 [IU] via SUBCUTANEOUS
  Filled 2024-03-01 (×2): qty 2

## 2024-03-01 MED ORDER — PRAMIPEXOLE DIHYDROCHLORIDE 1 MG PO TABS
1.0000 mg | ORAL_TABLET | Freq: Every evening | ORAL | Status: DC
  Administered 2024-03-01 – 2024-03-04 (×4): 1 mg via ORAL
  Filled 2024-03-01 (×6): qty 1

## 2024-03-01 MED ORDER — ACETAMINOPHEN 325 MG PO TABS
650.0000 mg | ORAL_TABLET | Freq: Four times a day (QID) | ORAL | Status: DC | PRN
  Administered 2024-03-01: 650 mg via ORAL
  Filled 2024-03-01: qty 2

## 2024-03-01 MED ORDER — PRAVASTATIN SODIUM 20 MG PO TABS
20.0000 mg | ORAL_TABLET | Freq: Every day | ORAL | Status: DC
  Administered 2024-03-01 – 2024-03-05 (×4): 20 mg via ORAL
  Filled 2024-03-01 (×5): qty 1

## 2024-03-01 MED ORDER — METOPROLOL SUCCINATE ER 50 MG PO TB24: 50.0000 mg | ORAL_TABLET | Freq: Every day | ORAL | Status: DC

## 2024-03-01 MED ORDER — POTASSIUM CHLORIDE 20 MEQ PO PACK
40.0000 meq | PACK | Freq: Once | ORAL | Status: AC
  Administered 2024-03-01: 40 meq via ORAL
  Filled 2024-03-01: qty 2

## 2024-03-01 MED ORDER — SODIUM CHLORIDE 0.9 % IV BOLUS
500.0000 mL | Freq: Once | INTRAVENOUS | Status: AC
  Administered 2024-03-01: 500 mL via INTRAVENOUS

## 2024-03-01 MED ORDER — LEVOTHYROXINE SODIUM 88 MCG PO TABS
88.0000 ug | ORAL_TABLET | Freq: Every day | ORAL | Status: DC
  Administered 2024-03-01 – 2024-03-05 (×5): 88 ug via ORAL
  Filled 2024-03-01 (×5): qty 1

## 2024-03-01 MED ORDER — PANTOPRAZOLE SODIUM 40 MG PO TBEC
40.0000 mg | DELAYED_RELEASE_TABLET | Freq: Every day | ORAL | Status: DC
  Administered 2024-03-01 – 2024-03-05 (×4): 40 mg via ORAL
  Filled 2024-03-01 (×5): qty 1

## 2024-03-01 MED ORDER — BENZONATATE 100 MG PO CAPS
100.0000 mg | ORAL_CAPSULE | Freq: Three times a day (TID) | ORAL | Status: DC
  Administered 2024-03-01 – 2024-03-05 (×10): 100 mg via ORAL
  Filled 2024-03-01 (×13): qty 1

## 2024-03-01 MED ORDER — GABAPENTIN 100 MG PO CAPS
100.0000 mg | ORAL_CAPSULE | Freq: Two times a day (BID) | ORAL | Status: DC
  Administered 2024-03-01 – 2024-03-05 (×9): 100 mg via ORAL
  Filled 2024-03-01 (×10): qty 1

## 2024-03-01 NOTE — Consult Note (Addendum)
 WOC Nurse Consult Note: Reason for Consult: rright leg wound.  Patient from home; weakness Hx. CHF/Afib/chronic venous stasis/DM Wound type: Venous stasis; right lateral distal leg Patient self reports that staff noted a wound on her back, when myself and the ED nurse attempted to look at it we can not visualize because of the stretcher and patient is sitting up to eat.  I will ask the nurses to take photo if something is present when they do their two nurse assessment  Pressure Injury POA: NA Measurement:2.0cm x 2.0cm x 0.1cm   Wound bed: dry; yellow  Drainage (amount, consistency, odor) none Periwound:intact Dressing procedure/placement/frequency: Cleanse RLE wound with saline, pat dry Cover with single layer of xeroform gauze, top with foam. Change every other day.   Discussed POC with patient and bedside nurse.  Re consult if needed, will not follow at this time. Thanks  Ido Wollman [employer] MSN, RN,CWOCN, CNS, [facility] [phone]     Addendum: floor nurse reports MASD, will use barrier cream.  Also noted to have areas on the right heel and right plantar foot, appears to be callous, orders updated

## 2024-03-01 NOTE — Consult Note (Signed)
 Please note that the [facility] nursing team is utilizing a standardized work plan to manage patient consults. We are triaging consults and will try to see the patients within 24 hours. Wound photos in the patient's chart allow us  to consult on the patient in the most efficient and timely manner.    Teiara Baria [facility], CNS, CWON-AP [phone]

## 2024-03-01 NOTE — Progress Notes (Signed)
 " PROGRESS NOTE  Dana Bradford  DOB: March 08, 1930  PCP: Almarie Waddell NOVAK, NP FMW:[id number]  DOA: 02/29/2024  LOS: 0 days  Hospital Day: 2  Subjective: Patient was seen and examined  Pleasant elderly Caucasian female.  Lying on bed.  Not in distress.  Not on supplemental oxygen. Was initially slow to respond and groggy but later was able to answer orientation questions. Family not at bedside. overnight, afebrile, remains tachycardic to 110s and 120s, blood pressure in normal range, breathing on room air Labs this morning with glucose level low at 68, potassium low at 3.4, CBC unremarkable, D-dimer elevated 1.5  Brief narrative: Dana Bradford is a 89 y.o. female with PMH significant for obesity, DM2, HTN, HLD, A-fib, CHF, chronic venous stasis, h/o breast cancer s/p lumpectomy, hypothyroidism, peripheral neuropathy She was recently seen by PCP for weakness, confusion, suspected of UTI and was started on antibiotics. 1/8, patient was brought to the ED from home by EMS for progressively worsening confusion, weakness, poor intake and inability to tolerate oral medications.  In the ED, patient was afebrile, heart rate in 120s, blood pressure in 100s, breathing on room air Labs with WC count 7.3, hemoglobin 14.7, creatinine 1.03, COVID PCR positive Chest x-ray unremarkable Urinalysis with hazy yellow urine, no leukocytes, nitrate or bacteria CT head unremarkable for acute findings, showed small chronic left cerebellar infarct, atrophy and chronic small vessel ischemic changes of the white matter.  Admitted to [facility]  Assessment and plan: COVID-19 infection Presented with shortness of breath, cough, weakness for few days COVID PCR positive in the ED Chest x-ray unremarkable Resp status stable.  Not on supplemental oxygen.  Not requiring antiviral or steroids.  Continue supportive care with antitussives, incentive spirometry.  Elevated lactic acid Elevated D-dimer Lactic acid normalized with  hydration D-dimer elevated as well in the setting of COVID.  Doubt any acute PE as patient is on chronic anticoagulation with Eliquis  Recent Labs  Lab 02/29/24 1911 02/29/24 1915 02/29/24 2312 03/01/24 0522 03/01/24 0714  WBC 7.3  --   --  6.2  --   LATICACIDVEN  --  1.9 3.8*  --  1.1   A-fib with RVR Has history of A-fib and was on oral metoprolol  at home but for the last few days she had not been able to tolerate oral meds. Was in RVR at presentation. Remains tachycardic overnight She has been continued on metoprolol  50 mg/25 mg -am/pm.  Continue to monitor on telemetry Chronically anticoagulated on Eliquis   Acute metabolic encephalopathy Likely secondary to COVID Continue to monitor  Type 2 diabetes mellitus Hypoglycemia A1c 6.4 on 06/26/2018 Blood glucose level low at 68 this morning.  On oral diet. PTA meds-metformin  500 mg nightly.  Not sure if she really needs metformin  continued but that A1c 6.4. Currently on SSI/Accu-Cheks Recent Labs  Lab 03/01/24 0806 03/01/24 1205  GLUCAP 68* 113*   Chronic diastolic CHF HTN Looked dehydrated at presentation and was started IV fluid. Last echo from February 2024 with EF 55 to 60% PTA meds-metoprolol  as above.  Not on diuretics at home Blood pressure currently stable on metoprolol   CKD Hypokalemia Creatinine at baseline.  Potassium level low this morning.  Replacement given Recent Labs  Lab 02/29/24 1953 03/01/24 0522  NA 135 137  K 3.6 3.4*  CL 98 100  CO2 24 24  GLUCOSE 95 74  BUN 27* 20  CREATININE 1.03* 0.79  CALCIUM  8.4* 7.8*   Prior cerebellar infarct  HLD Intervention as above.  Continue statin  Hypothyroidism Continue Synthroid .  TSH in normal range Recent Labs    06/26/23 1513 03/01/24 0522  TSH 0.35 0.449   Restless leg syndrome  Continue pramipexole .  Peripheral neuropathy Gabapentin  100 mg daily, Mirapex  1 mg daily  H/o gout Colchicine   H/o breast cancer  S/p lumpectomy  Follows up  with oncologist  Venous stasis ulcer  on the right leg.   Wound team consult.  GERD  PPI.   Generalized weakness  PT eval    Nutrition Status:         Mobility: Encourage ambulation.  PT eval ordered  PT Orders:   PT Follow up Rec:     Goals of care   Code Status: Limited: Do not attempt resuscitation (DNR) -DNR-LIMITED -Do Not Intubate/DNI      DVT prophylaxis:  apixaban  (ELIQUIS ) tablet 2.5 mg Start: 03/01/24 0030 apixaban  (ELIQUIS ) tablet 2.5 mg   Antimicrobials: None Fluid: None Consultants: None Family Communication: None at bedside  Status: Inpatient Level of care:  Telemetry   Patient is from: Home Needs to continue in-hospital care: Needs further inpatient monitoring for symptoms Anticipated d/c to: Hopefully home in 1 to 2 days     Diet:  Diet Order             Diet heart healthy/carb modified Room service appropriate? Yes; Fluid consistency: Thin  Diet effective now                   Scheduled Meds:  apixaban   2.5 mg Oral BID   benzonatate   100 mg Oral TID   colchicine   0.6 mg Oral Daily   dextromethorphan -guaiFENesin   1 tablet Oral BID   gabapentin   100 mg Oral BID   insulin  aspart  0-9 Units Subcutaneous TID WC   levothyroxine   88 mcg Oral Q0600   metoprolol  succinate  25 mg Oral QPC supper   metoprolol  succinate  50 mg Oral Daily   pantoprazole   40 mg Oral Daily   pramipexole   1 mg Oral QPM   pravastatin   20 mg Oral Daily    PRN meds: acetaminophen  **OR** acetaminophen    Infusions:    Antimicrobials: Anti-infectives (From admission, onward)    None       Objective: Vitals:   03/01/24 1047 03/01/24 1305  BP: 107/66 117/81  Pulse: (!) 122 (!) 106  Resp: 20 (!) 23  Temp: 98.2 F (36.8 C) 98.2 F (36.8 C)  SpO2: 92% 92%   No intake or output data in the 24 hours ending 03/01/24 1350 Filed Weights   02/29/24 1854  Weight: 66 kg   Weight change:  Body mass index is 30.41 kg/m.   Physical  Exam: General exam: Pleasant, elderly Caucasian female.  Not in distress Skin: No rashes, lesions or ulcers. HEENT: Atraumatic, normocephalic, no obvious bleeding Lungs: Clear to auscultation bilaterally, coughs on deep breathing CVS: S1, S2, no murmur,   GI/Abd: Soft, nontender, nondistended, bowel sound present,   CNS: Alert, awake, oriented to place and person Psychiatry: Mood appropriate Extremities: No pedal edema, no calf tenderness,   Data Review: I have personally reviewed the laboratory data and studies available.  F/u labs ordered Unresulted Labs (From admission, onward)    None       Signed, Chapman Rota, MD [facility] 03/01/2024  "

## 2024-03-02 DIAGNOSIS — U071 COVID-19: Secondary | ICD-10-CM | POA: Diagnosis not present

## 2024-03-02 LAB — GLUCOSE, CAPILLARY
Glucose-Capillary: 92 mg/dL (ref 70–99)
Glucose-Capillary: 112 mg/dL — ABNORMAL HIGH (ref 70–99)
Glucose-Capillary: 101 mg/dL — ABNORMAL HIGH (ref 70–99)
Glucose-Capillary: 130 mg/dL — ABNORMAL HIGH (ref 70–99)

## 2024-03-02 MED ORDER — PREDNISONE 20 MG PO TABS
20.0000 mg | ORAL_TABLET | Freq: Every day | ORAL | Status: DC
  Administered 2024-03-04 – 2024-03-05 (×2): 20 mg via ORAL
  Filled 2024-03-02 (×3): qty 1

## 2024-03-02 MED ORDER — MELATONIN 5 MG PO TABS
5.0000 mg | ORAL_TABLET | Freq: Every evening | ORAL | Status: DC | PRN
  Administered 2024-03-02 – 2024-03-03 (×2): 5 mg via ORAL
  Filled 2024-03-02 (×2): qty 1

## 2024-03-02 NOTE — Plan of Care (Signed)
" °  Problem: Respiratory: Goal: Will maintain a patent airway Outcome: Progressing Goal: Complications related to the disease process, condition or treatment will be avoided or minimized Outcome: Progressing   Problem: Skin Integrity: Goal: Risk for impaired skin integrity will decrease Outcome: Progressing   Problem: Clinical Measurements: Goal: Ability to maintain clinical measurements within normal limits will improve Outcome: Progressing   "

## 2024-03-02 NOTE — Progress Notes (Signed)
 " PROGRESS NOTE  Dana Bradford  DOB: 13-Apr-1930  PCP: Almarie Waddell NOVAK, NP FMW:[id number]  DOA: 02/29/2024  LOS: 1 day  Hospital Day: 3  Subjective: Patient was seen and examined this morning. Lying on bed.  Not in distress. Breathing normal.  Not on supplemental oxygen. Right little finger with swollen PIP joint due to gout. Afebrile, hemodynamically stable, breathing on room air  Brief narrative: Dana Bradford is a 89 y.o. female with PMH significant for obesity, DM2, HTN, HLD, A-fib, CHF, chronic venous stasis, h/o breast cancer s/p lumpectomy, hypothyroidism, peripheral neuropathy She was recently seen by PCP for weakness, confusion, suspected of UTI and was started on antibiotics. 1/8, patient was brought to the ED from home by EMS for progressively worsening confusion, weakness, poor intake and inability to tolerate oral medications.  In the ED, patient was afebrile, heart rate in 120s, blood pressure in 100s, breathing on room air Labs with WC count 7.3, hemoglobin 14.7, creatinine 1.03, COVID PCR positive Chest x-ray unremarkable Urinalysis with hazy yellow urine, no leukocytes, nitrate or bacteria CT head unremarkable for acute findings, showed small chronic left cerebellar infarct, atrophy and chronic small vessel ischemic changes of the white matter.  Admitted to [facility]  Assessment and plan: COVID-19 infection Presented with shortness of breath, cough, weakness for few days COVID PCR positive in the ED Chest x-ray unremarkable Resp status stable.  Not on supplemental oxygen.  Not requiring antiviral or steroids.  Continue supportive care with antitussives, incentive spirometry.  Elevated lactic acid Elevated D-dimer Lactic acid normalized with hydration D-dimer elevated as well in the setting of COVID.  Doubt any acute PE as patient is on chronic anticoagulation with Eliquis  Recent Labs  Lab 02/29/24 1911 02/29/24 1915 02/29/24 2312 03/01/24 0522 03/01/24 0714  WBC  7.3  --   --  6.2  --   LATICACIDVEN  --  1.9 3.8*  --  1.1   A-fib with RVR Has history of A-fib and was on oral metoprolol  at home but for the last few days she had not been able to tolerate oral meds. Was in RVR at presentation. Tachycardia has gradually improved after resumption of home dose of metoprolol .   Continue metoprolol  50 mg/25 mg -am/pm.  Continue to monitor on telemetry Chronically anticoagulated on Eliquis   Acute metabolic encephalopathy Likely secondary to COVID Mental status gradually improving. Continue to monitor  Right middle finger gouty arthritis H/o gout Chronically on colchicine  0.6 mg daily 1/9, patient's daughter complained of acute worsening of chronic pain on right little finger. X-ray showed -Periarticular erosions and extensive surrounding periarticular soft tissue swelling of the right fifth PIP joint, most compatible with gouty arthritis, with septic arthritis considered less likely. -Severe degenerative changes of the first digit carpometacarpal joint, second through fourth digit distal interphalangeal joints, fifth digit proximal interphalangeal joint, and second and third digit proximal interphalangeal joints. -Moderate degenerative changes of the first digit metacarpophalangeal joint. I have started the patient on 5 days of oral prednisone  20 mg daily Pain regimen --- Scheduled: Tylenol  1 g 3 times daily  Type 2 diabetes mellitus Hypoglycemia A1c 6.2 on 03/01/2024 Blood glucose level low at 68 this morning.  On oral diet. PTA meds-metformin  500 mg nightly.  Not sure if she really needs metformin  continued but that A1c 6.4. Currently on SSI/Accu-Cheks Recent Labs  Lab 03/01/24 1205 03/01/24 1702 03/01/24 2027 03/02/24 0811 03/02/24 1210  GLUCAP 113* 119* 135* 92 112*   Chronic diastolic CHF HTN Looked dehydrated at  presentation and was started IV fluid. Last echo from February 2024 with EF 55 to 60% PTA meds-metoprolol  as above.  Not on  diuretics at home It seems heart rate has been down to 40s and 50s and blood pressure has been low as well.  I would hold metoprolol .  CKD Hypokalemia Creatinine at baseline.   Potassium level was low at 3.4 on 1/9.  Replacement given Recent Labs  Lab 02/29/24 1953 03/01/24 0522  NA 135 137  K 3.6 3.4*  CL 98 100  CO2 24 24  GLUCOSE 95 74  BUN 27* 20  CREATININE 1.03* 0.79  CALCIUM  8.4* 7.8*   Prior cerebellar infarct  HLD Intervention as above.  Continue statin  Hypothyroidism Continue Synthroid .  TSH in normal range Recent Labs    06/26/23 1513 03/01/24 0522  TSH 0.35 0.449   Restless leg syndrome  Continue pramipexole .  Peripheral neuropathy Gabapentin  100 mg daily, Mirapex  1 mg daily  H/o breast cancer  S/p lumpectomy  Follows up with oncologist  Venous stasis ulcer  on the right leg.   Wound team consult.  GERD  PPI.   Generalized weakness  PT eval pending   Nutrition Status:         Mobility: Encourage ambulation.  PT eval ordered  PT Orders: Active   PT Follow up Rec:     Goals of care   Code Status: Limited: Do not attempt resuscitation (DNR) -DNR-LIMITED -Do Not Intubate/DNI      DVT prophylaxis:  apixaban  (ELIQUIS ) tablet 2.5 mg Start: 03/01/24 0030 apixaban  (ELIQUIS ) tablet 2.5 mg   Antimicrobials: None Fluid: None Consultants: None Family Communication: None at bedside.  Today I called and updated patient's son and daughter.  They are expecting SNF after PT eval  Status: Inpatient Level of care:  Telemetry   Patient is from: Home Needs to continue in-hospital care: Pending PT eval Anticipated d/c to: Place expecting SNF    Diet:  Diet Order             Diet heart healthy/carb modified Room service appropriate? Yes; Fluid consistency: Thin  Diet effective now                   Scheduled Meds:  acetaminophen   1,000 mg Oral TID   apixaban   2.5 mg Oral BID   benzonatate   100 mg Oral TID   colchicine    0.6 mg Oral Daily   dextromethorphan -guaiFENesin   1 tablet Oral BID   gabapentin   100 mg Oral BID   insulin  aspart  0-9 Units Subcutaneous TID WC   levothyroxine   88 mcg Oral Q0600   metoprolol  succinate  25 mg Oral QPC supper   pantoprazole   40 mg Oral Daily   pramipexole   1 mg Oral QPM   pravastatin   20 mg Oral Daily   [START ON 03/03/2024] predniSONE   20 mg Oral Q breakfast    PRN meds: melatonin   Infusions:    Antimicrobials: Anti-infectives (From admission, onward)    None       Objective: Vitals:   03/02/24 1234 03/02/24 1241  BP: (!) 81/58 102/75  Pulse: (!) 53 78  Resp:    Temp:    SpO2: 98% 95%    Intake/Output Summary (Last 24 hours) at 03/02/2024 1607 Last data filed at 03/02/2024 1300 Gross per 24 hour  Intake 440 ml  Output 600 ml  Net -160 ml   Filed Weights   02/29/24 1854 03/01/24 1413  Weight:  66 kg 64.3 kg   Weight change: -1.7 kg Body mass index is 29.63 kg/m.   Physical Exam: General exam: Pleasant, elderly Caucasian female.  Not in distress Skin: No rashes, lesions or ulcers. HEENT: Atraumatic, normocephalic, no obvious bleeding Lungs: Clear to auscultation bilaterally, coughs on deep breathing CVS: S1, S2, no murmur,   GI/Abd: Soft, nontender, nondistended, bowel sound present,   CNS: Alert, awake, oriented to place and person Psychiatry: Mood appropriate Extremities: No pedal edema, no calf tenderness.  Right hand little finger PIP with gouty tophi and surrounding redness  Data Review: I have personally reviewed the laboratory data and studies available.  F/u labs ordered Unresulted Labs (From admission, onward)     Start     Ordered   03/03/24 0500  CBC with Differential/Platelet  Tomorrow morning,   R        03/02/24 1336   03/03/24 0500  Basic metabolic panel with GFR  Tomorrow morning,   R        03/02/24 1336            Signed, Chapman Rota, MD [facility] 03/02/2024  "

## 2024-03-02 NOTE — Evaluation (Signed)
 Physical Therapy Evaluation Patient Details Name: Dana Bradford MRN: [id number] DOB: [birth year] Today's Date: 03/02/2024  History of Present Illness  Pt is a 89y.o. female  presenting with fever chills and nonproductive cough with weakness and confusion last 2 days with difficulty ambulating found to be COVID-19 positive. Past medical history of atrial fibrillation, chronic diastolic CHF, chronic venous stasis, diabetes mellitus type 2, history of breast cancer status postlumpectomy, hypothyroidism, and recently placed on Neurontin  by primary care for neuropathy.   Clinical Impression  Pt is a 89 y.o. female with above HPI resulting in the deficits listed below (see PT Problem List). Pt disoriented and with impaired cognition during  session- PT called pt's daughter to confirm home setup/PLOF. Pt's daughter reports that pt has history of delirium when in hospital setting. Pt is typically MOD I with mobility and ADLs and lives alone. Daughter lives across the street, but works so pt typically able to manage on her own at home. Daughter reports past 3-4 days they have had to use w/c due to difficulty ambulating due to fatigue/weakness and pt has been doing sponge baths due to same. Pt performed bed mobility with up to MIN A this date and sat EOB for several minutes. Pt declined OOB mobility this date despite repeated education on importance of mobility. Pt will benefit from skilled PT to maximize functional mobility to increase independence. Due to reported MOD I level of function at baseline and current decline in mobility noted prior to admission recommend continued inpatient therapy <3hrs/day upon d/c to maximize functional mobility and increased independence prior to return home.          If plan is discharge home, recommend the following: Two people to help with walking and/or transfers;A lot of help with bathing/dressing/bathroom;Direct supervision/assist for financial management;Direct  supervision/assist for medications management;Assistance with cooking/housework;Assist for transportation;Help with stairs or ramp for entrance;Supervision due to cognitive status   Can travel by private vehicle   No    Equipment Recommendations None recommended by PT (pt owns RW)  Recommendations for Other Services  OT consult    Functional Status Assessment Patient has had a recent decline in their functional status and demonstrates the ability to make significant improvements in function in a reasonable and predictable amount of time.     Precautions / Restrictions Precautions Precautions: Fall Restrictions Weight Bearing Restrictions Per Provider Order: No      Mobility  Bed Mobility Overal bed mobility: Needs Assistance Bed Mobility: Supine to Sit, Sit to Supine     Supine to sit: HOB elevated, Min assist Sit to supine: Supervision, Used rails, HOB elevated   General bed mobility comments: increased time. Assist for trunk to upright. increased effort for LEs back onto bed at end of session, able to scoot to [facility] with bed positoined in trendelenburg. Goal of OOB at least to chair as pt reporting she does not want to leave bed, but initially agreeable to chair. Pt sat EOB several minutes, ultimately refusing OOB mobility and resisting asisst from NT/PT when attempting to stand. Educated on benefits of OOB mobility to decrease effects of hospital stay and with goals of improving toward PLOF- pt continued to defer mobility.    Transfers                        Ambulation/Gait                  Stairs  Wheelchair Mobility     Tilt Bed    Modified Rankin (Stroke Patients Only)       Balance Overall balance assessment: Needs assistance Sitting-balance support: No upper extremity supported, Feet supported Sitting balance-Leahy Scale: Fair                                       Pertinent Vitals/Pain Pain  Assessment Pain Assessment: No/denies pain    Home Living Family/patient expects to be discharged to:: Private residence Living Arrangements: Alone Available Help at Discharge: Family Type of Home: House Home Access: Stairs to enter Entrance Stairs-Rails: Can reach both Entrance Stairs-Number of Steps: 3 (garage)   Home Layout: One level Home Equipment: [employer] (2 wheels);Cane - single point;Wheelchair - manual Additional Comments: Daughter lives across the street. Son lives ~10-31miles away. Daughter reports had to use w/c at home due to weakness.    Prior Function Prior Level of Function : Independent/Modified Independent             Mobility Comments: RW. Daughter reports no falls past year. Has not been up walking for past 3-4 days. ADLs Comments: Does cooking, cleaning, ADLs. Sponge baths due to weakness per daughter. Doesn't drive. Daughter picks up gropceries.     Extremity/Trunk Assessment   Upper Extremity Assessment Upper Extremity Assessment: Defer to OT evaluation    Lower Extremity Assessment Lower Extremity Assessment: Generalized weakness       Communication   Communication Communication: No apparent difficulties    Cognition Arousal: Alert Behavior During Therapy: WFL for tasks assessed/performed   PT - Cognitive impairments: No family/caregiver present to determine baseline, Orientation   Orientation impairments: Place, Time, Situation                   PT - Cognition Comments: pt reporting she is at an apartmetn complex and noted to be asking same questions/repeated topics multiple times during session. Daughter reports pt tends to get delirium when in hospital setting - history of Following commands: Intact       Cueing       General Comments General comments (skin integrity, edema, etc.): O2 sats stable during session    Exercises     Assessment/Plan    PT Assessment Patient needs continued PT services  PT Problem  List Decreased strength;Decreased activity tolerance;Decreased balance;Decreased mobility;Decreased cognition;Decreased safety awareness       PT Treatment Interventions DME instruction;Gait training;Stair training;Functional mobility training;Therapeutic activities;Therapeutic exercise;Balance training;Patient/family education;Wheelchair mobility training    PT Goals (Current goals can be found in the Care Plan section)  Acute Rehab PT Goals Patient Stated Goal: return home - family would like to pursue rehab to improve strength prior to return home alone PT Goal Formulation: With patient/family Time For Goal Achievement: 03/16/24 Potential to Achieve Goals: Good    Frequency Min 2X/week     Co-evaluation               AM-PAC PT 6 Clicks Mobility  Outcome Measure Help needed turning from your back to your side while in a flat bed without using bedrails?: A Little Help needed moving from lying on your back to sitting on the side of a flat bed without using bedrails?: A Little Help needed moving to and from a bed to a chair (including a wheelchair)?: A Lot Help needed standing up from a chair using your arms (e.g., wheelchair  or bedside chair)?: A Lot Help needed to walk in hospital room?: A Lot Help needed climbing 3-5 steps with a railing? : A Lot 6 Click Score: 14    End of Session   Activity Tolerance: Patient tolerated treatment well Patient left: in bed;with call bell/phone within reach;with chair alarm set (bed in chair position) Nurse Communication: Mobility status;Other (comment) (pt declining to get OOB despite education) PT Visit Diagnosis: Unsteadiness on feet (R26.81);Muscle weakness (generalized) (M62.81);Difficulty in walking, not elsewhere classified (R26.2)    Time: 8851-8786 PT Time Calculation (min) (ACUTE ONLY): 25 min   Charges:   PT Evaluation $PT Eval Low Complexity: 1 Low PT Treatments $Therapeutic Activity: 8-22 mins PT General Charges $$  ACUTE PT VISIT: 1 Visit         Tinnie BERRY PT, DPT  Acute Rehabilitation Services  Office [phone]  03/02/2024, 5:43 PM

## 2024-03-03 DIAGNOSIS — U071 COVID-19: Secondary | ICD-10-CM | POA: Diagnosis not present

## 2024-03-03 LAB — CBC WITH DIFFERENTIAL/PLATELET
Abs Immature Granulocytes: 0.03 K/uL (ref 0.00–0.07)
Basophils Absolute: 0.1 K/uL (ref 0.0–0.1)
Basophils Relative: 1 %
Eosinophils Absolute: 0.1 K/uL (ref 0.0–0.5)
Eosinophils Relative: 1 %
HCT: 41.6 % (ref 36.0–46.0)
Hemoglobin: 14.1 g/dL (ref 12.0–15.0)
Immature Granulocytes: 0 %
Lymphocytes Relative: 25 %
Lymphs Abs: 2 K/uL (ref 0.7–4.0)
MCH: 31.9 pg (ref 26.0–34.0)
MCHC: 33.9 g/dL (ref 30.0–36.0)
MCV: 94.1 fL (ref 80.0–100.0)
Monocytes Absolute: 0.7 K/uL (ref 0.1–1.0)
Monocytes Relative: 9 %
Neutro Abs: 5.2 K/uL (ref 1.7–7.7)
Neutrophils Relative %: 64 %
Platelets: 344 K/uL (ref 150–400)
RBC: 4.42 MIL/uL (ref 3.87–5.11)
RDW: 13.1 % (ref 11.5–15.5)
WBC: 8 K/uL (ref 4.0–10.5)
nRBC: 0 % (ref 0.0–0.2)

## 2024-03-03 LAB — BASIC METABOLIC PANEL WITH GFR
Anion gap: 13 (ref 5–15)
BUN: 17 mg/dL (ref 8–23)
CO2: 22 mmol/L (ref 22–32)
Calcium: 8.4 mg/dL — ABNORMAL LOW (ref 8.9–10.3)
Chloride: 98 mmol/L (ref 98–111)
Creatinine, Ser: 0.91 mg/dL (ref 0.44–1.00)
GFR, Estimated: 60 mL/min — ABNORMAL LOW
Glucose, Bld: 96 mg/dL (ref 70–99)
Potassium: 3.9 mmol/L (ref 3.5–5.1)
Sodium: 132 mmol/L — ABNORMAL LOW (ref 135–145)

## 2024-03-03 LAB — GLUCOSE, CAPILLARY
Glucose-Capillary: 108 mg/dL — ABNORMAL HIGH (ref 70–99)
Glucose-Capillary: 92 mg/dL (ref 70–99)

## 2024-03-03 NOTE — Progress Notes (Addendum)
 RN talked to family at bedside to inform them of pt refusing medication, vitals, and lab work. Family is aware and has encouraged her to take meds she reused this morning at about 1700 this afternoon. Pt is refusing all meds and treatment from this RN at this time and states I have the right to refuse. RN will continue to monitor and encourage pt to take medications and to increase PO intake. RN will also notify on call provider if pt continues to refuse.   Pt allowed vitals BP is 98/81 with a map of 89. PO fluids encouraged and taken. BP retaken   94/71 map of 77

## 2024-03-03 NOTE — Progress Notes (Signed)
 Pt refusing morning labs. Pt is alert and oriented to self, location, and situation. Pt states I have the right to refuse medical treatment and my son trust in my decision making ability Charge nurse Kiki also notified.

## 2024-03-03 NOTE — [place] FL2 (Signed)
 " [place]  MEDICAID FL2 LEVEL OF CARE FORM     IDENTIFICATION  Patient Name: Dana Bradford Birthdate: 1931-01-05 Sex: female Admission Date (Current Location): 02/29/2024  [facility] and [place] Number:  Producer, Television/film/video and Address:  [facility],  [address], [place] [address]      Provider Number: [id number]  Attending Physician Name and Address:  Arlice Reichert, MD  Relative Name and Phone Number:  [name] (Daughter), Emergency Contact, [phone]    Current Level of Care: Hospital Recommended Level of Care: Skilled Nursing Facility Prior Approval Number:    Date Approved/Denied:   PASRR Number: [id number]  Discharge Plan: SNF    Current Diagnoses: Patient Active Problem List   Diagnosis Date Noted   Gout 03/01/2024   Atrial fibrillation with RVR (HCC) 03/01/2024   COVID-19 02/29/2024   Malignant neoplasm of overlapping sites of left female breast (HCC) 09/26/2022   Osteoporosis 09/16/2021   Frequent UTI 06/25/2021   Stage 3b chronic kidney disease (HCC) 02/23/2021   Chronic anticoagulation 05/12/2020   Venous stasis ulcer (HCC)    Skin cancer    Glaucoma    Food allergy    Permanent atrial fibrillation (HCC)    Arthritis    Anemia    CHF (congestive heart failure) (HCC) 04/22/2020   History of revision of total replacement of left hip joint 01/31/2020   Hypothyroidism 01/19/2020   Hyperlipidemia 01/19/2020   Degeneration of lumbar intervertebral disc 12/02/2019   Degenerative spondylolisthesis 12/02/2019   Scoliosis deformity of spine 12/02/2019   Back pain 08/24/2019   Chronic venous insufficiency 03/01/2019   Diabetic ulcer of toe of right foot associated with type 2 diabetes mellitus, with fat layer exposed (HCC) 02/08/2019   Venous stasis dermatitis of left lower extremity 06/05/2018   OA (osteoarthritis) of hip 02/28/2018   Pain in joint of right hip 02/06/2018   Controlled type 2 diabetes mellitus with hyperglycemia,  without long-term current use of insulin  (HCC) 10/18/2017   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 macular degeneration of left eye 10/18/2017   Primary open angle glaucoma (POAG) of both eyes, indeterminate stage 10/18/2017   Retinal artery branch occlusion of right eye 06/12/2017   Stroke (HCC) 05/2017   Status post amputation of lesser toe of right foot 05/13/2017   Lymphedema of both lower extremities 12/26/2014   Venous hypertension of both lower extremities 12/26/2014   Vaginal vault prolapse 08/12/2014   GERD (gastroesophageal reflux disease) 11/13/2012   Essential hypertension, benign 06/17/2012   Restless leg syndrome 06/05/2012    Orientation RESPIRATION BLADDER Height & Weight     Self  Normal Incontinent Weight: 141 lb 12.1 oz (64.3 kg) Height:  4' 10 (147.3 cm)  BEHAVIORAL SYMPTOMS/MOOD NEUROLOGICAL BOWEL NUTRITION STATUS      Incontinent Diet (Heart Healthy Carb Modified)  AMBULATORY STATUS COMMUNICATION OF NEEDS Skin   Extensive Assist Verbally Other (Comment) (Vascular Ulcer Ankle Distal;Right;Lateral, Vascular Ulcer Heel Right,)                       Personal Care Assistance Level of Assistance  Bathing, Feeding, Dressing Bathing Assistance: Limited assistance Feeding assistance: Independent Dressing Assistance: Limited assistance     Functional Limitations Info  Sight, Hearing, Speech Sight Info: Impaired (eyeglasses) Hearing Info: Impaired Speech Info: Adequate    SPECIAL CARE FACTORS FREQUENCY  PT (By licensed PT), OT (By licensed OT)     PT Frequency: 5x per week OT Frequency: 5x per week  Contractures Contractures Info: Not present    Additional Factors Info  Code Status, Allergies Code Status Info: DNR Allergies Info: Arthrotec (Diclofenac -misoprostol), Doxycycline, Latex, Amlodipine, Celebrex (Celecoxib), Vancomycin , Diclofenac , Shellfish Allergy           Current Medications (03/03/2024):  This is the current  hospital active medication list Current Facility-Administered Medications  Medication Dose Route Frequency Provider Last Rate Last Admin   acetaminophen  (TYLENOL ) tablet 1,000 mg  1,000 mg Oral TID Dahal, Binaya, MD   1,000 mg at 03/02/24 2217   apixaban  (ELIQUIS ) tablet 2.5 mg  2.5 mg Oral BID Kakrakandy, Arshad N, MD   2.5 mg at 03/02/24 2217   benzonatate  (TESSALON ) capsule 100 mg  100 mg Oral TID Dahal, Binaya, MD   100 mg at 03/02/24 2217   colchicine  tablet 0.6 mg  0.6 mg Oral Daily Franky Redia SAILOR, MD   0.6 mg at 03/02/24 1108   dextromethorphan -guaiFENesin  (MUCINEX  DM) 30-600 MG per 12 hr tablet 1 tablet  1 tablet Oral BID Dahal, Binaya, MD   1 tablet at 03/02/24 2217   gabapentin  (NEURONTIN ) capsule 100 mg  100 mg Oral BID Franky Redia SAILOR, MD   100 mg at 03/02/24 2217   insulin  aspart (novoLOG ) injection 0-9 Units  0-9 Units Subcutaneous TID WC Franky Redia SAILOR, MD       levothyroxine  (SYNTHROID ) tablet 88 mcg  88 mcg Oral Q0600 Kakrakandy, Arshad N, MD   88 mcg at 03/03/24 0631   melatonin tablet 5 mg  5 mg Oral QHS PRN Chavez, Abigail, NP   5 mg at 03/02/24 0038   metoprolol  succinate (TOPROL -XL) 24 hr tablet 25 mg  25 mg Oral QPC supper Kakrakandy, Arshad N, MD   25 mg at 03/02/24 1721   pantoprazole  (PROTONIX ) EC tablet 40 mg  40 mg Oral Daily Franky Redia SAILOR, MD   40 mg at 03/02/24 1106   pramipexole  (MIRAPEX ) tablet 1 mg  1 mg Oral QPM Franky Redia SAILOR, MD   1 mg at 03/02/24 1721   pravastatin  (PRAVACHOL ) tablet 20 mg  20 mg Oral Daily Franky Redia SAILOR, MD   20 mg at 03/02/24 1106   predniSONE  (DELTASONE ) tablet 20 mg  20 mg Oral Q breakfast Dahal, Chapman, MD         Discharge Medications: Please see discharge summary for a list of discharge medications.  Relevant Imaging Results:  Relevant Lab Results:   Additional Information SSN: [id number]  Heather LABOR Devorah Givhan, LCSW     "

## 2024-03-03 NOTE — Progress Notes (Signed)
 " PROGRESS NOTE  Dana Bradford  DOB: 09/08/1930  PCP: Almarie Waddell NOVAK, NP FMW:[id number]  DOA: 02/29/2024  LOS: 2 days  Hospital Day: 4  Subjective: Patient was seen and examined this morning. Lying down in bed.  Not in distress. Earlier she was uncooperative and restless to the nurses.  Calm and cheerful at the time of my evaluation.  Family not at bedside. Right pinky finger redness improving.  Afebrile, hemodynamically stable, breathing on room air Blood glucose level in acceptable range. Seen by PT yesterday.  SNF recommended  Brief narrative: Dana Bradford is a 89 y.o. female with PMH significant for obesity, DM2, HTN, HLD, A-fib, CHF, chronic venous stasis, h/o breast cancer s/p lumpectomy, hypothyroidism, peripheral neuropathy She was recently seen by PCP for weakness, confusion, suspected of UTI and was started on antibiotics. 1/8, patient was brought to the ED from home by EMS for progressively worsening confusion, weakness, poor intake and inability to tolerate oral medications.  In the ED, patient was afebrile, heart rate in 120s, blood pressure in 100s, breathing on room air Labs with WBC count 7.3, hemoglobin 14.7, creatinine 1.03, COVID PCR positive Chest x-ray unremarkable Urinalysis with hazy yellow urine, no leukocytes, nitrate or bacteria CT head unremarkable for acute findings, showed small chronic left cerebellar infarct, atrophy and chronic small vessel ischemic changes of the white matter.  Admitted to [facility]  Assessment and plan: COVID-19 infection Presented with shortness of breath, cough, weakness for few days COVID PCR positive in the ED Chest x-ray unremarkable Resp status stable.  Not on supplemental oxygen.  Not requiring antiviral or steroids.  Continue supportive care with antitussives, incentive spirometry.  Elevated lactic acid Elevated D-dimer Lactic acid normalized with hydration D-dimer elevated as well in the setting of COVID.  Respiratory  status stable.  Doubt any acute PE as patient is on chronic anticoagulation with Eliquis  Continue Eliquis  Recent Labs  Lab 02/29/24 1911 02/29/24 1915 02/29/24 2312 03/01/24 0522 03/01/24 0714  WBC 7.3  --   --  6.2  --   LATICACIDVEN  --  1.9 3.8*  --  1.1   A-fib with RVR Has history of A-fib and was on oral metoprolol  at home but for the last few days she had not been able to tolerate oral meds. Was in RVR at presentation. Tachycardia has gradually improved after resumption of home dose of metoprolol .   PTA, on metoprolol  50 mg/25 mg -am/pm. Because of bradycardia, metoprolol  dose was reduced currently Toprol  1 mg daily. Continue to monitor on telemetry Chronically anticoagulated on Eliquis    Chronic diastolic CHF HTN Looked dehydrated at presentation and was started IV fluid. Last echo from February 2024 with EF 55 to 60% Improved as above.  Not on diuretics at home  Acute metabolic encephalopathy Likely secondary to COVID Mental status gradually improving. Continue to monitor  Right middle finger gouty arthritis H/o gout Chronically on colchicine  0.6 mg daily 1/9, patient's daughter complained of acute worsening of chronic pain on right little finger. X-ray showed -Periarticular erosions and extensive surrounding periarticular soft tissue swelling of the right fifth PIP joint, most compatible with gouty arthritis, with septic arthritis considered less likely. -Severe degenerative changes of the first digit carpometacarpal joint, second through fourth digit distal interphalangeal joints, fifth digit proximal interphalangeal joint, and second and third digit proximal interphalangeal joints. -Moderate degenerative changes of the first digit metacarpophalangeal joint. 1/10, I discussed with patient and family and started the patient on 5 days of oral prednisone  20  mg daily Pain regimen --- Scheduled: Tylenol  1 g 3 times daily  Type 2 diabetes mellitus Hypoglycemia A1c  6.2 on 03/01/2024 Blood glucose level low at 68 this morning.  On oral diet. PTA meds-metformin  500 mg nightly.  Currently on hold.  Not sure if she really needs metformin  as her A1c is controlled at 6.4. Currently on SSI/Accu-Cheks Recent Labs  Lab 03/01/24 2027 03/02/24 0811 03/02/24 1210 03/02/24 1701 03/02/24 2057  GLUCAP 135* 92 112* 101* 130*   CKD Hypokalemia Creatinine at baseline.   Potassium level was low at 3.4 on 1/9.  Replacement given Recent Labs  Lab 02/29/24 1953 03/01/24 0522  NA 135 137  K 3.6 3.4*  CL 98 100  CO2 24 24  GLUCOSE 95 74  BUN 27* 20  CREATININE 1.03* 0.79  CALCIUM  8.4* 7.8*   Prior cerebellar infarct  HLD Intervention as above.  Continue statin  Hypothyroidism Continue Synthroid .  TSH in normal range Recent Labs    06/26/23 1513 03/01/24 0522  TSH 0.35 0.449   Restless leg syndrome  Continue pramipexole .  Peripheral neuropathy Gabapentin  100 mg daily, Mirapex  1 mg daily  H/o breast cancer  S/p lumpectomy  Follows up with oncologist  Venous stasis ulcer  on the right leg.   Wound team consult.  GERD  PPI.   Generalized weakness  PT eval obtained.  SNF recommended PT Follow up Rec: Skilled Nursing-Short Term Rehab (<3 Hours/Day)03/02/2024 1700    Goals of care   Code Status: Limited: Do not attempt resuscitation (DNR) -DNR-LIMITED -Do Not Intubate/DNI      DVT prophylaxis:  apixaban  (ELIQUIS ) tablet 2.5 mg Start: 03/01/24 0030 apixaban  (ELIQUIS ) tablet 2.5 mg   Antimicrobials: None Fluid: None Consultants: None Family Communication: None at bedside.  Today I called and updated patient's son and daughter.  They are expecting SNF after PT eval  Status: Inpatient Level of care:  Telemetry   Patient is from: Home Needs to continue in-hospital care: Medically stable.  Pending SNF Anticipated d/c to: SNF    Diet:  Diet Order             Diet heart healthy/carb modified Room service appropriate? Yes; Fluid  consistency: Thin  Diet effective now                   Scheduled Meds:  acetaminophen   1,000 mg Oral TID   apixaban   2.5 mg Oral BID   benzonatate   100 mg Oral TID   colchicine   0.6 mg Oral Daily   dextromethorphan -guaiFENesin   1 tablet Oral BID   gabapentin   100 mg Oral BID   insulin  aspart  0-9 Units Subcutaneous TID WC   levothyroxine   88 mcg Oral Q0600   metoprolol  succinate  25 mg Oral QPC supper   pantoprazole   40 mg Oral Daily   pramipexole   1 mg Oral QPM   pravastatin   20 mg Oral Daily   predniSONE   20 mg Oral Q breakfast    PRN meds: melatonin   Infusions:    Antimicrobials: Anti-infectives (From admission, onward)    None       Objective: Vitals:   03/02/24 2103 03/03/24 0349  BP: (!) 95/58 103/69  Pulse: 87 97  Resp: 20 18  Temp: 97.8 F (36.6 C) 98 F (36.7 C)  SpO2: 91% 93%    Intake/Output Summary (Last 24 hours) at 03/03/2024 1206 Last data filed at 03/03/2024 0802 Gross per 24 hour  Intake 340 ml  Output 500 ml  Net -160 ml   Filed Weights   02/29/24 1854 03/01/24 1413  Weight: 66 kg 64.3 kg   Weight change:  Body mass index is 29.63 kg/m.   Physical Exam: General exam: Pleasant, elderly Caucasian female.  Not in distress Skin: No rashes, lesions or ulcers. HEENT: Atraumatic, normocephalic, no obvious bleeding Lungs: Clear to auscultation bilaterally, coughs on deep breathing CVS: S1, S2, no murmur,   GI/Abd: Soft, nontender, nondistended, bowel sound present,   CNS: Alert, awake, oriented to place and person Psychiatry: Mood appropriate Extremities: No pedal edema, no calf tenderness.  Right hand little finger PIP with gouty tophi and surrounding redness-improving  Data Review: I have personally reviewed the laboratory data and studies available.  F/u labs ordered Unresulted Labs (From admission, onward)     Start     Ordered   03/03/24 0500  CBC with Differential/Platelet  Tomorrow morning,   R        03/02/24 1336    03/03/24 0500  Basic metabolic panel with GFR  Tomorrow morning,   R        03/02/24 1336            Signed, Chapman Rota, MD [facility] 03/03/2024  "

## 2024-03-03 NOTE — Progress Notes (Signed)
 Physical Therapy Treatment Patient Details Name: Dana Bradford MRN: [id number] DOB: Jun 04, 1930 Today's Date: 03/03/2024   History of Present Illness Pt is a 89y.o. female  presenting with fever chills and nonproductive cough with weakness and confusion last 2 days with difficulty ambulating found to be COVID-19 positive. Past medical history of atrial fibrillation, chronic diastolic CHF, chronic venous stasis, diabetes mellitus type 2, history of breast cancer status postlumpectomy, hypothyroidism, and recently placed on Neurontin  by primary care for neuropathy.    PT Comments  Pt is progressing toward acute PT goals this session with progression to sit to stand transfers and improved participation. Pt performed bed mobility with Min A and increased time. Performed sit to stand transfers x2 with Total A +2 progressing to MAX A+2 on 2nd stand trial. Educated pt on benefits of OOB mobility for goals of return toward PLOF and to decrease effects of hospital stay. Daughter present at end of session encouraged for delirium precautions, pt awake and placed in chair position in bed with lights on, blinds open. Pt will benefit from continued skilled PT to increase their independence and maximize safety with mobility.      If plan is discharge home, recommend the following: Two people to help with walking and/or transfers;A lot of help with bathing/dressing/bathroom;Direct supervision/assist for financial management;Direct supervision/assist for medications management;Assistance with cooking/housework;Assist for transportation;Help with stairs or ramp for entrance;Supervision due to cognitive status   Can travel by private vehicle     No  Equipment Recommendations  Other (comment) (defer to next level of care)    Recommendations for Other Services OT consult     Precautions / Restrictions Precautions Precautions: Fall Restrictions Weight Bearing Restrictions Per Provider Order: No     Mobility   Bed Mobility Overal bed mobility: Needs Assistance Bed Mobility: Supine to Sit, Sit to Supine     Supine to sit: HOB elevated, Min assist Sit to supine: Supervision, HOB elevated, Used rails   General bed mobility comments: MIN A for trunk to upright. increased time and effort but pt able to perform sit to supine with supervision and use of bed rails.    Transfers Overall transfer level: Needs assistance Equipment used: 2 person hand held assist Transfers: Sit to/from Stand Sit to Stand: Total assist, +2 physical assistance, +2 safety/equipment           General transfer comment: STS x2, total A initially progressed to MAX A+2 on 2nd stand trial, cues for hand placement and improved LE placement, posterior leaning in standing. pt reports she does not like to sit up in recliner chair- assisted back to bed.    Ambulation/Gait                   Stairs             Wheelchair Mobility     Tilt Bed    Modified Rankin (Stroke Patients Only)       Balance Overall balance assessment: Needs assistance Sitting-balance support: No upper extremity supported, Feet supported Sitting balance-Leahy Scale: Fair     Standing balance support: Bilateral upper extremity supported, During functional activity Standing balance-Leahy Scale: Zero                              Communication Communication Communication: Impaired Factors Affecting Communication: Hearing impaired  Cognition Arousal: Alert Behavior During Therapy: WFL for tasks assessed/performed   PT - Cognitive impairments: Orientation  Orientation impairments: Person, Place, Time                   PT - Cognition Comments: noted improved cognition with pt noting she is in hospital A&Ox4, unable to tell why she is in hospital. Following commands: Intact      Cueing Cueing Techniques: Verbal cues, Tactile cues, Visual cues  Exercises      General Comments General comments (skin  integrity, edema, etc.): Pt's daughter entering room at end of session- udpated on pt performance.      Pertinent Vitals/Pain Pain Assessment Pain Assessment: No/denies pain    Home Living                          Prior Function            PT Goals (current goals can now be found in the care plan section) Acute Rehab PT Goals Patient Stated Goal: return home - family would like to pursue rehab to improve strength prior to return home alone PT Goal Formulation: With patient/family Time For Goal Achievement: 03/16/24 Potential to Achieve Goals: Fair Progress towards PT goals: Progressing toward goals    Frequency    Min 2X/week      PT Plan      Co-evaluation              AM-PAC PT 6 Clicks Mobility   Outcome Measure  Help needed turning from your back to your side while in a flat bed without using bedrails?: A Little Help needed moving from lying on your back to sitting on the side of a flat bed without using bedrails?: A Little Help needed moving to and from a bed to a chair (including a wheelchair)?: Total Help needed standing up from a chair using your arms (e.g., wheelchair or bedside chair)?: Total Help needed to walk in hospital room?: Total Help needed climbing 3-5 steps with a railing? : Total 6 Click Score: 10    End of Session Equipment Utilized During Treatment: Gait belt Activity Tolerance: Patient tolerated treatment well Patient left: in bed;with call bell/phone within reach;with chair alarm set;with family/visitor present (bed in chair position) Nurse Communication: Mobility status;Other (comment) PT Visit Diagnosis: Unsteadiness on feet (R26.81);Muscle weakness (generalized) (M62.81);Difficulty in walking, not elsewhere classified (R26.2)     Time: 8661-8595 PT Time Calculation (min) (ACUTE ONLY): 26 min  Charges:    $Therapeutic Activity: 23-37 mins PT General Charges $$ ACUTE PT VISIT: 1 Visit                      Tinnie BERRY PT, DPT  Acute Rehabilitation Services  Office [phone]  03/03/2024, 3:30 PM

## 2024-03-03 NOTE — TOC Initial Note (Signed)
 Transition of Care [facility]) - Initial/Assessment Note    Patient Details  Name: Dana Bradford MRN: [id number] Date of Birth: 1930/03/09  Transition of Care [facility]) CM/SW Contact:    Heather DELENA Saltness, LCSW Phone Number: 03/03/2024, 3:19 PM  Clinical Narrative:                 Pt admitted to the hospital for weakness, confusion, and cough. Pt currently alert and oriented x1, confused. Pt resides at home alone, daughter lives across the street. Pt uses RW at home to ambulate. CSW spoke with pt's daughter, Channing Cleaves [phone], via phone call to discuss discharge planning and PT's recommendation for SNF rehab. Pt's daughter agreeable to SNF rehab. Daughter's preferred facility is [facility], states pt's husband went there for SNF years ago. Daughter requests pt not go to [facility] or [facility] due to past negative experiences. CSW completed FL2 and sent referrals to SNF's in [place] and surrounding areas. CSW sent message to Bristol at [facility] asking to review referral. TOC will continue to follow.    Expected Discharge Plan: Skilled Nursing Facility Barriers to Discharge: Continued Medical Work up, SNF Pending bed offer   Patient Goals and CMS Choice Patient states their goals for this hospitalization and ongoing recovery are:: To go to SNF for rehab CMS Medicare.gov Compare Post Acute Care list provided to:: Patient Represenative (must comment) Choice offered to / list presented to : Adult Children [place] ownership interest in [facility].provided to:: Adult Children    Expected Discharge Plan and Services In-house Referral: Clinical Social Work Discharge Planning Services: NA Post Acute Care Choice: Skilled Nursing Facility Living arrangements for the past 2 months: Single Family Home                 DME Arranged: N/A DME Agency: NA       HH Arranged: NA HH Agency: NA        Prior Living Arrangements/Services Living arrangements for the past 2  months: Single Family Home Lives with:: Self Patient language and need for interpreter reviewed:: Yes Do you feel safe going back to the place where you live?: No   Pt lives alone, daughter wants pt to go to SNF rehab  Need for Family Participation in Patient Care: Yes (Comment) Care giver support system in place?: Yes (comment) Current home services: DME Criminal Activity/Legal Involvement Pertinent to Current Situation/Hospitalization: No - Comment as needed  Permission Sought/Granted Permission sought to share information with : Facility Medical Sales Representative, Family Supports Permission granted to share information with : Yes, Verbal Permission Granted  Share Information with NAME: Channing Cleaves  Permission granted to share info w AGENCY: SNF  Permission granted to share info w Relationship: Daughter  Permission granted to share info w Contact Information: [phone]  Emotional Assessment Appearance::  (UTA) Attitude/Demeanor/Rapport: Unable to Assess Affect (typically observed): Unable to Assess Orientation: : Oriented to Self Alcohol  / Substance Use: Not Applicable Psych Involvement: No (comment)  Admission diagnosis:  Restless leg syndrome [G25.81] Permanent atrial fibrillation (HCC) [I48.21] Gastroesophageal reflux disease without esophagitis [K21.9] Hypothyroidism, unspecified type [E03.9] COVID [U07.1] COVID-19 [U07.1] Patient Active Problem List   Diagnosis Date Noted   Gout 03/01/2024   Atrial fibrillation with RVR (HCC) 03/01/2024   COVID-19 02/29/2024   Malignant neoplasm of overlapping sites of left female breast (HCC) 09/26/2022   Osteoporosis 09/16/2021   Frequent UTI 06/25/2021   Stage 3b chronic kidney disease (HCC) 02/23/2021   Chronic anticoagulation 05/12/2020  Venous stasis ulcer (HCC)    Skin cancer    Glaucoma    Food allergy    Permanent atrial fibrillation (HCC)    Arthritis    Anemia    CHF (congestive heart failure) (HCC) 04/22/2020    History of revision of total replacement of left hip joint 01/31/2020   Hypothyroidism 01/19/2020   Hyperlipidemia 01/19/2020   Degeneration of lumbar intervertebral disc 12/02/2019   Degenerative spondylolisthesis 12/02/2019   Scoliosis deformity of spine 12/02/2019   Back pain 08/24/2019   Chronic venous insufficiency 03/01/2019   Diabetic ulcer of toe of right foot associated with type 2 diabetes mellitus, with fat layer exposed (HCC) 02/08/2019   Venous stasis dermatitis of left lower extremity 06/05/2018   OA (osteoarthritis) of hip 02/28/2018   Pain in joint of right hip 02/06/2018   Controlled type 2 diabetes mellitus with hyperglycemia, without long-term current use of insulin  (HCC) 10/18/2017   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 macular degeneration of left eye 10/18/2017   Primary open angle glaucoma (POAG) of both eyes, indeterminate stage 10/18/2017   Retinal artery branch occlusion of right eye 06/12/2017   Stroke (HCC) 05/2017   Status post amputation of lesser toe of right foot 05/13/2017   Lymphedema of both lower extremities 12/26/2014   Venous hypertension of both lower extremities 12/26/2014   Vaginal vault prolapse 08/12/2014   GERD (gastroesophageal reflux disease) 11/13/2012   Essential hypertension, benign 06/17/2012   Restless leg syndrome 06/05/2012   PCP:  Almarie Waddell NOVAK, NP Pharmacy:   [facility] [place] [facility] [address], [address] [place] [address] Phone: [phone] Fax: [phone]  [facility] - [place], [place] - [address] [place] [address] Phone: [phone] Fax: [phone]   Social Drivers of Health (SDOH) Social History: SDOH Screenings   Food Insecurity: Patient Unable To Answer (03/02/2024)  Housing: Patient Unable To Answer (03/02/2024)  Transportation Needs: Patient Unable To Answer (03/02/2024)  Utilities: Patient Unable To  Answer (03/02/2024)  Financial Resource Strain: Patient Declined (06/25/2023)  Physical Activity: Unknown (06/25/2023)  Social Connections: Patient Unable To Answer (03/02/2024)  Stress: Patient Declined (06/25/2023)  Tobacco Use: Medium Risk (02/29/2024)   SDOH Interventions: None     Readmission Risk Interventions    03/03/2024    3:17 PM  Readmission Risk Prevention Plan  Transportation Screening Complete  PCP or Specialist Appt within 5-7 Days Complete  Home Care Screening Complete  Medication Review (RN CM) Complete    Signed: Heather Saltness, MSW, LCSW Clinical Social Worker Inpatient Care Management 03/03/2024 3:24 PM

## 2024-03-04 LAB — GLUCOSE, CAPILLARY
Glucose-Capillary: 82 mg/dL (ref 70–99)
Glucose-Capillary: 86 mg/dL (ref 70–99)
Glucose-Capillary: 151 mg/dL — ABNORMAL HIGH (ref 70–99)
Glucose-Capillary: 133 mg/dL — ABNORMAL HIGH (ref 70–99)

## 2024-03-04 MED ORDER — SODIUM CHLORIDE 0.9 % IV SOLN: INTRAVENOUS | Status: DC

## 2024-03-04 MED ORDER — ENSURE PLUS HIGH PROTEIN PO LIQD
237.0000 mL | Freq: Two times a day (BID) | ORAL | Status: DC
  Administered 2024-03-04 – 2024-03-05 (×3): 237 mL via ORAL

## 2024-03-04 NOTE — Progress Notes (Addendum)
 " PROGRESS NOTE  Dana Bradford  DOB: 11-22-1930  PCP: Almarie Waddell NOVAK, NP FMW:[id number]  DOA: 02/29/2024  LOS: 3 days  Hospital Day: 5  Subjective: Patient was seen and examined this morning. elderly Caucasian female.  Not in distress. Alert, awake, only answers yes/no questions.  Demented.  Not restless or agitated. Son at bedside. Per RN documentations, patient has been refusing her medications and nursing care. Afebrile, blood pressure 90s and low 100s, breathing on room air. No labs this morning   Brief narrative: Dana Bradford is a 89 y.o. female with PMH significant for obesity, DM2, HTN, HLD, A-fib, CHF, chronic venous stasis, h/o breast cancer s/p lumpectomy, hypothyroidism, peripheral neuropathy She was recently seen by PCP for weakness, confusion, suspected of UTI and was started on antibiotics. 1/8, patient was brought to the ED from home by EMS for progressively worsening confusion, weakness, poor intake and inability to tolerate oral medications.  In the ED, patient was afebrile, heart rate in 120s, blood pressure in 100s, breathing on room air Labs with WBC count 7.3, hemoglobin 14.7, creatinine 1.03, COVID PCR positive Chest x-ray unremarkable Urinalysis with hazy yellow urine, no leukocytes, nitrate or bacteria CT head unremarkable for acute findings, showed small chronic left cerebellar infarct, atrophy and chronic small vessel ischemic changes of the white matter. Admitted to [facility] Currently pending placement  Assessment and plan: COVID-19 infection Presented with shortness of breath, cough, weakness for few days COVID PCR positive in the ED Chest x-ray unremarkable Resp status stable.  Not on supplemental oxygen.  Not requiring antiviral or steroids.  Continue supportive care with antitussives, incentive spirometry.  Elevated lactic acid Elevated D-dimer Lactic acid normalized with hydration D-dimer elevated as well in the setting of COVID.  Respiratory status  stable.  Doubt any acute PE as patient is on chronic anticoagulation with Eliquis  Continue Eliquis  Recent Labs  Lab 02/29/24 1911 02/29/24 1915 02/29/24 2312 03/01/24 0522 03/01/24 0714 03/03/24 1603  WBC 7.3  --   --  6.2  --  8.0  LATICACIDVEN  --  1.9 3.8*  --  1.1  --    A-fib with RVR Has history of A-fib and was on oral metoprolol  at home but for the last few days she had not been able to tolerate oral meds. Was in RVR at presentation. Tachycardia has gradually improved after resumption of home dose of metoprolol .   PTA, on metoprolol  50 mg/25 mg -am/pm. Because of bradycardia, metoprolol  dose was reduced currently Toprol  1 mg daily. Continue to monitor on telemetry Chronically anticoagulated on Eliquis    Chronic diastolic CHF HTN Looked dehydrated at presentation and was started IV fluid. Last echo from February 2024 with EF 55 to 60% Improved as above.  Not on diuretics at home. Encourage oral hydration.  No need of IV fluid at this time.  Acute metabolic encephalopathy Likely secondary to COVID Mental status gradually improving. Continue to monitor  Right middle finger gouty arthritis H/o gout Chronically on colchicine  0.6 mg daily 1/9, patient's daughter complained of acute worsening of chronic pain on right little finger. X-ray showed -Periarticular erosions and extensive surrounding periarticular soft tissue swelling of the right fifth PIP joint, most compatible with gouty arthritis, with septic arthritis considered less likely. -Severe degenerative changes of the first digit carpometacarpal joint, second through fourth digit distal interphalangeal joints, fifth digit proximal interphalangeal joint, and second and third digit proximal interphalangeal joints. -Moderate degenerative changes of the first digit metacarpophalangeal joint. 1/10, I discussed with patient  and family and started the patient on 5 days of oral prednisone  20 mg daily Pain regimen ---  Scheduled: Tylenol  1 g 3 times daily  Type 2 diabetes mellitus Hypoglycemia A1c 6.2 on 03/01/2024 Blood glucose level low at 68 this morning.  On oral diet. PTA meds-metformin  500 mg nightly.  Currently on hold.  Not sure if she really needs metformin  as her A1c is controlled at 6.4. Currently on SSI/Accu-Cheks Recent Labs  Lab 03/02/24 2057 03/03/24 1609 03/03/24 2143 03/04/24 0750 03/04/24 1144  GLUCAP 130* 108* 92 82 86   CKD Hypokalemia Creatinine at baseline.   Potassium level was low at 3.4 on 1/9.  Replacement given Recent Labs  Lab 02/29/24 1953 03/01/24 0522 03/03/24 1603  NA 135 137 132*  K 3.6 3.4* 3.9  CL 98 100 98  CO2 24 24 22   GLUCOSE 95 74 96  BUN 27* 20 17  CREATININE 1.03* 0.79 0.91  CALCIUM  8.4* 7.8* 8.4*   Prior cerebellar infarct  HLD Intervention as above.  Continue statin  Hypothyroidism Continue Synthroid .  TSH in normal range Recent Labs    06/26/23 1513 03/01/24 0522  TSH 0.35 0.449   Restless leg syndrome  Continue pramipexole .  Peripheral neuropathy Gabapentin  100 mg daily, Mirapex  1 mg daily  H/o breast cancer  S/p lumpectomy  Follows up with oncologist  Venous stasis ulcer  on the right leg.   Wound team consult.  GERD  PPI.   Generalized weakness  PT eval obtained.  SNF recommended PT Follow up Rec: Skilled Nursing-Short Term Rehab (<3 Hours/Day)03/03/2024 1300    Goals of care   Code Status: Limited: Do not attempt resuscitation (DNR) -DNR-LIMITED -Do Not Intubate/DNI      DVT prophylaxis:  apixaban  (ELIQUIS ) tablet 2.5 mg Start: 03/01/24 0030 apixaban  (ELIQUIS ) tablet 2.5 mg   Antimicrobials: None Fluid: None Consultants: None Family Communication: Patient's son at bedside.  Status: Inpatient Level of care:  Telemetry   Patient is from: Home Needs to continue in-hospital care: Medically stable.  Pending SNF Anticipated d/c to: SNF    Diet:  Diet Order             Diet heart healthy/carb  modified Room service appropriate? Yes; Fluid consistency: Thin  Diet effective now                   Scheduled Meds:  acetaminophen   1,000 mg Oral TID   apixaban   2.5 mg Oral BID   benzonatate   100 mg Oral TID   colchicine   0.6 mg Oral Daily   dextromethorphan -guaiFENesin   1 tablet Oral BID   feeding supplement  237 mL Oral BID BM   gabapentin   100 mg Oral BID   insulin  aspart  0-9 Units Subcutaneous TID WC   levothyroxine   88 mcg Oral Q0600   metoprolol  succinate  25 mg Oral QPC supper   pantoprazole   40 mg Oral Daily   pramipexole   1 mg Oral QPM   pravastatin   20 mg Oral Daily   predniSONE   20 mg Oral Q breakfast    PRN meds: melatonin   Infusions:     Antimicrobials: Anti-infectives (From admission, onward)    None       Objective: Vitals:   03/04/24 0500 03/04/24 0845  BP: 94/70 (!) 92/55  Pulse: 90 85  Resp: 20 18  Temp: 98.8 F (37.1 C) 98 F (36.7 C)  SpO2: 93% 93%    Intake/Output Summary (Last 24 hours) at  03/04/2024 1256 Last data filed at 03/04/2024 9378 Gross per 24 hour  Intake 809.94 ml  Output 550 ml  Net 259.94 ml   Filed Weights   02/29/24 1854 03/01/24 1413  Weight: 66 kg 64.3 kg   Weight change:  Body mass index is 29.63 kg/m.   Physical Exam: General exam: Pleasant, elderly Caucasian female.  Not in distress Skin: No rashes, lesions or ulcers. HEENT: Atraumatic, normocephalic, no obvious bleeding Lungs: Clear to auscultation bilaterally, coughs on deep breathing CVS: S1, S2, no murmur,   GI/Abd: Soft, nontender, nondistended, bowel sound present,   CNS: Alert, awake, oriented to place.  Able to answer yes/no questions and follow commands.   Psychiatry: Mood appropriate Extremities: No pedal edema, no calf tenderness.  Right hand little finger PIP with gouty tophi and surrounding redness-improving  Data Review: I have personally reviewed the laboratory data and studies available.  F/u labs ordered Unresulted Labs  (From admission, onward)    None       Signed, Chapman Rota, MD [facility] 03/04/2024  "

## 2024-03-04 NOTE — TOC Progression Note (Signed)
 Transition of Care [facility]) - Progression Note    Patient Details  Name: Dana Bradford MRN: [id number] Date of Birth: 07-28-1930  Transition of Care [facility]) CM/SW Contact  Heather DELENA Saltness, LCSW Phone Number: 03/04/2024, 3:39 PM  Clinical Narrative:    CSW spoke with pt's daughter, Channing Cleaves [phone], to discuss SNF bed availability an obtain daughter's facility choice. CSW provided daughter with list of SNFs with available beds including name of facility, location, and Medicare Star-Rating. Pt's daughter chooses [facility]. CSW informed daughter of $48/per day fee that's not covered by insurance. Pt's daughter reports understanding. CSW notified Whitney at [facility].  Medicare Star Ratings  [facility] ([facility]) [address] [place], [place] [address] [phone] Overall rating ????? Much above average   Expected Discharge Plan: Skilled Nursing Facility Barriers to Discharge: Continued Medical Work up, SNF Pending bed offer   Expected Discharge Plan and Services In-house Referral: Clinical Social Work Discharge Planning Services: NA Post Acute Care Choice: Skilled Nursing Facility Living arrangements for the past 2 months: Single Family Home                 DME Arranged: N/A DME Agency: NA       HH Arranged: NA HH Agency: NA         Social Drivers of Health (SDOH) Interventions SDOH Screenings   Food Insecurity: Patient Unable To Answer (03/02/2024)  Housing: Patient Unable To Answer (03/02/2024)  Transportation Needs: Patient Unable To Answer (03/02/2024)  Utilities: Patient Unable To Answer (03/02/2024)  Financial Resource Strain: Patient Declined (06/25/2023)  Physical Activity: Unknown (06/25/2023)  Social Connections: Patient Unable To Answer (03/02/2024)  Stress: Patient Declined (06/25/2023)  Tobacco Use: Medium Risk (02/29/2024)    Readmission Risk Interventions    03/03/2024    3:17 PM  Readmission Risk Prevention Plan  Transportation  Screening Complete  PCP or Specialist Appt within 5-7 Days Complete  Home Care Screening Complete  Medication Review (RN CM) Complete    Signed: Heather Saltness, MSW, LCSW Clinical Social Worker Inpatient Care Management 03/04/2024 3:43 PM

## 2024-03-04 NOTE — Evaluation (Signed)
 Occupational Therapy Evaluation Patient Details Name: Dana Bradford MRN: [id number] DOB: 09-18-1930 Today's Date: 03/04/2024   History of Present Illness   Pt is a 89y.o. female  presenting with fever chills and nonproductive cough with weakness and confusion last 2 days with difficulty ambulating found to be COVID-19 positive. Past medical history of atrial fibrillation, chronic diastolic CHF, chronic venous stasis, diabetes mellitus type 2, history of breast cancer status postlumpectomy, hypothyroidism, and recently placed on Neurontin  by primary care for neuropathy.     Clinical Impressions PTA, patient lives at home with family right nearby for support and assist, was amb with RW but had declined the last 4-5 days at home to w/c use and assist with ADL's. Currently, patient presents with deficits outlined below (see OT Problem List for details) most significantly generalized muscle weakness, decreased cognition, coordination, balance and activity tolerance limiting BADL's and functional mobility performance. Patient will benefit from continued inpatient follow up therapy, <3 hours/day. Patient requires continued Acute care hospital level OT services to progress safety and functional performance and allow for discharge.       If plan is discharge home, recommend the following:   Two people to help with walking and/or transfers;A lot of help with bathing/dressing/bathroom;Assistance with cooking/housework;Direct supervision/assist for medications management;Direct supervision/assist for financial management;Assist for transportation;Help with stairs or ramp for entrance;Supervision due to cognitive status     Functional Status Assessment   Patient has had a recent decline in their functional status and demonstrates the ability to make significant improvements in function in a reasonable and predictable amount of time.     Equipment Recommendations   None recommended by OT (tbd at  next venue)      Precautions/Restrictions   Precautions Precautions: Fall Restrictions Weight Bearing Restrictions Per Provider Order: No     Mobility Bed Mobility Overal bed mobility: Needs Assistance Bed Mobility: Supine to Sit     Supine to sit: HOB elevated, Min assist, Used rails     General bed mobility comments: min A for trunk upright but then sat EOB in prep for lateral scoot transfer    Transfers Overall transfer level: Needs assistance Equipment used: 2 person hand held assist Transfers: Sit to/from Stand, Bed to chair/wheelchair/BSC Sit to Stand: +2 physical assistance, +2 safety/equipment, Mod assist          Lateral/Scoot Transfers: Max assist, +2 physical assistance, +2 safety/equipment, Mod assist General transfer comment: STS with stable recliner x 3 sets and max A, lateral scoot with arm rest down from bed to recliner Transfer via Lift Equipment:  (recommend use of STEDY +2 for nursing back to bed)    Balance Overall balance assessment: Needs assistance Sitting-balance support: No upper extremity supported, Feet supported Sitting balance-Leahy Scale: Fair     Standing balance support: Bilateral upper extremity supported, During functional activity Standing balance-Leahy Scale: Poor                             ADL either performed or assessed with clinical judgement   ADL Overall ADL's : Needs assistance/impaired Eating/Feeding: Set up;Sitting   Grooming: Wash/dry hands;Wash/dry face;Minimal assistance;Sitting;Cueing for sequencing   Upper Body Bathing: Moderate assistance;Sitting;Cueing for sequencing   Lower Body Bathing: Total assistance;Bed level   Upper Body Dressing : Moderate assistance;Sitting;Cueing for sequencing   Lower Body Dressing: Total assistance;Bed level     Toilet Transfer Details (indicate cue type and reason): bed pan currently Toileting- Clothing Manipulation  and Hygiene: Total assistance;Bed level        Functional mobility during ADLs: Moderate assistance;Maximal assistance;+2 for physical assistance;+2 for safety/equipment General ADL Comments: tolerated lateral scoot to recliner and light UB ADL's this visit     Vision Ability to See in Adequate Light: 0 Adequate Patient Visual Report: No change from baseline              Pertinent Vitals/Pain Pain Assessment Pain Assessment: Faces Faces Pain Scale: No hurt Pain Intervention(s): Monitored during session     Extremity/Trunk Assessment Upper Extremity Assessment Upper Extremity Assessment: Generalized weakness;Right hand dominant (baseline OA defomities B hands)   Lower Extremity Assessment Lower Extremity Assessment: Defer to PT evaluation   Cervical / Trunk Assessment Cervical / Trunk Assessment: Kyphotic   Communication Communication Communication: Impaired Factors Affecting Communication: Hearing impaired   Cognition Arousal: Alert Behavior During Therapy: WFL for tasks assessed/performed Cognition: History of cognitive impairments             OT - Cognition Comments: alert, Ox 2, not dated or situation, limitied insight and judgement with decreased safety; and STM                 Following commands: Intact       Cueing  General Comments   Cueing Techniques: Verbal cues;Tactile cues;Visual cues  BP 98/64, HR 94 RR 16, LE's elevated in recliner and patient reported no SOB or lightheadedness           Home Living Family/patient expects to be discharged to:: Private residence Living Arrangements: Alone Available Help at Discharge: Family Type of Home: House Home Access: Stairs to enter [employer] of Steps: 3 (garage) Entrance Stairs-Rails: Can reach both Home Layout: One level     Bathroom Shower/Tub: Tub/shower unit;Walk-in shower   Bathroom Toilet: Standard Bathroom Accessibility: Yes   Home Equipment: [employer] (2 wheels);Cane - single point;Wheelchair -  manual   Additional Comments: Daughter lives across the street. Son lives ~10-64miles away. Daughter reports had to use w/c at home due to weakness.      Prior Functioning/Environment Prior Level of Function : Independent/Modified Independent             Mobility Comments: RW. Daughter reports no falls past year. Has not been up walking for past 3-4 days. ADLs Comments: Does cooking, cleaning, ADLs. Sponge baths due to weakness per daughter. Doesn't drive. Daughter picks up gropceries.    OT Problem List: Decreased strength;Decreased activity tolerance;Impaired balance (sitting and/or standing);Decreased coordination;Decreased cognition;Decreased safety awareness;Decreased knowledge of use of DME or AE;Decreased knowledge of precautions;Cardiopulmonary status limiting activity   OT Treatment/Interventions: Self-care/ADL training;Therapeutic exercise;Neuromuscular education;Energy conservation;DME and/or AE instruction;Therapeutic activities;Cognitive remediation/compensation;Patient/family education;Balance training      OT Goals(Current goals can be found in the care plan section)   Acute Rehab OT Goals Patient Stated Goal: to get stronger OT Goal Formulation: With patient Time For Goal Achievement: 03/18/24 Potential to Achieve Goals: Fair ADL Goals Pt Will Perform Upper Body Bathing: with set-up;standing Pt Will Perform Upper Body Dressing: with set-up;sitting Pt Will Transfer to Toilet: squat pivot transfer;with +2 assist;bedside commode;with mod assist Pt/caregiver will Perform Home Exercise Program: Increased strength;With Supervision;With written HEP provided;Both right and left upper extremity   OT Frequency:  Min 2X/week       AM-PAC OT 6 Clicks Daily Activity     Outcome Measure Help from another person eating meals?: A Little Help from another person taking care of personal grooming?: A Little Help  from another person toileting, which includes using toliet,  bedpan, or urinal?: Total Help from another person bathing (including washing, rinsing, drying)?: Total Help from another person to put on and taking off regular upper body clothing?: A Lot Help from another person to put on and taking off regular lower body clothing?: Total 6 Click Score: 11   End of Session Equipment Utilized During Treatment: Gait belt Nurse Communication: Mobility status;Need for lift equipment;Other (comment) (need for +2 lateral scoot or STEDY +2 NT and nurse both verbalized understanding)  Activity Tolerance: Patient limited by fatigue Patient left: in chair;with call bell/phone within reach;with chair alarm set  OT Visit Diagnosis: Unsteadiness on feet (R26.81);Muscle weakness (generalized) (M62.81);Cognitive communication deficit (R41.841)                Time: 8699-8664 OT Time Calculation (min): 35 min Charges:  OT General Charges $OT Visit: 1 Visit OT Evaluation $OT Eval Low Complexity: 1 Low OT Treatments $Self Care/Home Management : 8-22 mins  Azahel Belcastro OT/L Acute Rehabilitation Department  [phone]  03/04/2024, 1:55 PM

## 2024-03-04 NOTE — Progress Notes (Signed)
 Informed by RN patient with poor oral intake.  Patient has also been refusing most of her medications and stating to nurse I have to like to refuse all my medications.  Sodium earlier today was 132.  BP soft at 98-94 systolic.  Patient started on normal saline at 100 cc/h.

## 2024-03-05 LAB — GLUCOSE, CAPILLARY
Glucose-Capillary: 89 mg/dL (ref 70–99)
Glucose-Capillary: 151 mg/dL — ABNORMAL HIGH (ref 70–99)

## 2024-03-05 MED ORDER — MELATONIN 5 MG PO TABS: 5.0000 mg | ORAL_TABLET | Freq: Every evening | ORAL | Status: AC | PRN

## 2024-03-05 MED ORDER — GUAIFENESIN-DM 100-10 MG/5ML PO SYRP: 5.0000 mL | ORAL_SOLUTION | ORAL | Status: AC | PRN

## 2024-03-05 MED ORDER — FUROSEMIDE 40 MG PO TABS: 40.0000 mg | ORAL_TABLET | Freq: Every day | ORAL | Status: AC | PRN

## 2024-03-05 MED ORDER — PREDNISONE 20 MG PO TABS: 20.0000 mg | ORAL_TABLET | Freq: Every day | ORAL | Status: DC

## 2024-03-05 MED ORDER — ACETAMINOPHEN 325 MG PO TABS: 650.0000 mg | ORAL_TABLET | ORAL | Status: DC | PRN

## 2024-03-05 MED ORDER — BENZONATATE 100 MG PO CAPS: 100.0000 mg | ORAL_CAPSULE | Freq: Three times a day (TID) | ORAL | Status: DC

## 2024-03-05 MED ORDER — INSULIN ASPART 100 UNIT/ML IJ SOLN: 0.0000 [IU] | Freq: Three times a day (TID) | INTRAMUSCULAR | Status: DC

## 2024-03-05 MED ORDER — METOPROLOL SUCCINATE ER 25 MG PO TB24: 25.0000 mg | ORAL_TABLET | Freq: Every day | ORAL | Status: AC

## 2024-03-05 MED ORDER — ENSURE PLUS HIGH PROTEIN PO LIQD: 237.0000 mL | Freq: Two times a day (BID) | ORAL | Status: AC

## 2024-03-05 NOTE — Care Management Important Message (Signed)
 Important Message  Patient Details IM Letter given. Name: Dana Bradford MRN: [id number] Date of Birth: 05/17/1930   Important Message Given:  Yes - Medicare IM     [name] 03/05/2024, 10:25 AM

## 2024-03-05 NOTE — TOC Transition Note (Signed)
 Transition of Care [facility]) - Discharge Note   Patient Details  Name: Dana Bradford MRN: [id number] Date of Birth: 07-Feb-1931  [facility] CM/SW Contact:  Heather DELENA Saltness, LCSW Phone Number: 03/05/2024, 1:35 PM   Clinical Narrative:    Pt discharging to [facility] today for short-term rehab. Pt admitting to room 118. D/C packet placed in pt's chart at RN station. RN to call report to [phone]. PTAR called at 1301. Pt's daughter, Dana Bradford, made aware and in agreement with discharge plan. No further TOC needs at this time.   Final next level of care: Skilled Nursing Facility Barriers to Discharge: Barriers Resolved   Patient Goals and CMS Choice Patient states their goals for this hospitalization and ongoing recovery are:: To go to SNF CMS Medicare.gov Compare Post Acute Care list provided to:: Patient Represenative (must comment) Choice offered to / list presented to : Adult Children [place] ownership interest in [facility].provided to:: Adult Children    Discharge Placement          Patient chooses bed at: [facility] Patient to be transferred to facility by: PTAR Name of family member notified: Dana Bradford Patient and family notified of of transfer: 03/05/24  Discharge Plan and Services Additional resources added to the After Visit Summary for  Follow Up In-house Referral: Clinical Social Work Discharge Planning Services: NA Post Acute Care Choice: Skilled Nursing Facility          DME Arranged: N/A DME Agency: NA       HH Arranged: NA HH Agency: NA        Social Drivers of Health (SDOH) Interventions SDOH Screenings   Food Insecurity: Patient Unable To Answer (03/02/2024)  Housing: Patient Unable To Answer (03/02/2024)  Transportation Needs: Patient Unable To Answer (03/02/2024)  Utilities: Patient Unable To Answer (03/02/2024)  Financial Resource Strain: Patient Declined (06/25/2023)  Physical Activity: Unknown  (06/25/2023)  Social Connections: Patient Unable To Answer (03/02/2024)  Stress: Patient Declined (06/25/2023)  Tobacco Use: Medium Risk (02/29/2024)     Readmission Risk Interventions    03/03/2024    3:17 PM  Readmission Risk Prevention Plan  Transportation Screening Complete  PCP or Specialist Appt within 5-7 Days Complete  Home Care Screening Complete  Medication Review (RN CM) Complete    Signed: Heather Saltness, MSW, LCSW Clinical Social Worker Inpatient Care Management 03/05/2024 1:35 PM

## 2024-03-05 NOTE — Discharge Summary (Signed)
 "  Physician Discharge Summary  Dana Bradford DOB: [birth year] DOA: 02/29/2024  PCP: Almarie Waddell NOVAK, NP  Admit date: 02/29/2024 Discharge date: 03/05/2024  Admitted from: home Discharge disposition: SNF  Recommendations at discharge:  For cough, take 5 more days of scheduled Tessalon  Perles and as needed MucinexDM.  For gout, you have been started on 5 days of oral prednisone  20 mg daily, to complete the course on 1/15. Take Tylenol  as needed for pain control Your hemoglobin A1c is well-controlled for age.  No need to continue metformin .  Given your risk of dehydration, no need to take scheduled diuretics.  Only use Lasix  as needed for shortness of breath and pedal edema. Metoprolol  dose has been reduced to 25 mg daily.  Subjective: Patient was seen and examined this morning. Propped up in bed.  Not in distress.  Family at bedside. Afebrile, hemodynamically stable, breathing on room air.  Brief narrative: Dana Bradford is a 89 y.o. female with PMH significant for obesity, DM2, HTN, HLD, A-fib, CHF, chronic venous stasis, h/o breast cancer s/p lumpectomy, hypothyroidism, peripheral neuropathy She was recently seen by PCP for weakness, confusion, suspected of UTI and was started on antibiotics. 1/8, patient was brought to the ED from home by EMS for progressively worsening confusion, weakness, poor intake and inability to tolerate oral medications.  In the ED, patient was afebrile, heart rate in 120s, blood pressure in 100s, breathing on room air Labs with WBC count 7.3, hemoglobin 14.7, creatinine 1.03, COVID PCR positive Chest x-ray unremarkable Urinalysis with hazy yellow urine, no leukocytes, nitrate or bacteria CT head unremarkable for acute findings, showed small chronic left cerebellar infarct, atrophy and chronic small vessel ischemic changes of the white matter. Admitted to [facility] Currently pending placement  Hospital course: COVID-19 infection Presented with  shortness of breath, cough, weakness for few days COVID PCR positive in the ED Chest x-ray unremarkable Resp status stable.  Not on supplemental oxygen.  Not requiring antiviral or steroids.   Continue supportive care with antitussives, incentive spirometry.  Elevated lactic acid Elevated D-dimer Lactic acid normalized with hydration D-dimer elevated as well in the setting of COVID.  Respiratory status stable.  Doubt any acute PE as patient is on chronic anticoagulation with Eliquis  Continue Eliquis  Recent Labs  Lab 02/29/24 1911 02/29/24 1915 02/29/24 2312 03/01/24 0522 03/01/24 0714 03/03/24 1603  WBC 7.3  --   --  6.2  --  8.0  LATICACIDVEN  --  1.9 3.8*  --  1.1  --    A-fib with RVR Has history of A-fib and was on oral metoprolol  at home but for the last few days she had not been able to tolerate oral meds. Was in RVR at presentation. Tachycardia has gradually improved after resumption of home dose of metoprolol .   PTA, on metoprolol  50 mg/25 mg -am/pm. Because of bradycardia, metoprolol  dose was reduced currently Toprol  25 mg daily.  Her heart rate and blood pressure has been stable on it. Chronically anticoagulated on Eliquis    Chronic diastolic CHF HTN Looked dehydrated at presentation and was started IV fluid. Last echo from February 2024 with EF 55 to 60% It seems patient was taking scheduled Lasix  at home.  Given her poor oral intake and risk of dehydration, I would keep her only on as needed Lasix .  Acute metabolic encephalopathy Likely secondary to COVID Mental status gradually improving. Continue to monitor  Right middle finger gouty arthritis H/o gout Chronically on colchicine  0.6 mg daily 1/9, patient's  daughter complained of acute worsening of chronic pain on right little finger. X-ray showed -Periarticular erosions and extensive surrounding periarticular soft tissue swelling of the right fifth PIP joint, most compatible with gouty arthritis, with  septic arthritis considered less likely. -Severe degenerative changes of the first digit carpometacarpal joint, second through fourth digit distal interphalangeal joints, fifth digit proximal interphalangeal joint, and second and third digit proximal interphalangeal joints. -Moderate degenerative changes of the first digit metacarpophalangeal joint. 1/10, I discussed with patient and family and started the patient on 5 days of oral prednisone  20 mg daily.  Complete the course on 1/15 Tylenol  as needed for pain control  Type 2 diabetes mellitus Hypoglycemia A1c 6.2 on 03/01/2024 Blood glucose level low at 68 this morning.  On oral diet. PTA meds-metformin  500 mg nightly.  Her A1c is controlled at 6.4.  I would suggest to stop metformin  Recent Labs  Lab 03/04/24 1144 03/04/24 1650 03/04/24 2105 03/05/24 0808 03/05/24 1225  GLUCAP 86 151* 133* 89 151*   CKD Hypokalemia Hyponatremia Creatinine at baseline.   Potassium level improved with replacement. Sodium level slightly low.  Expect improvement with improvement in oral intake Recent Labs  Lab 02/29/24 1953 03/01/24 0522 03/03/24 1603  NA 135 137 132*  K 3.6 3.4* 3.9  CL 98 100 98  CO2 24 24 22   GLUCOSE 95 74 96  BUN 27* 20 17  CREATININE 1.03* 0.79 0.91  CALCIUM  8.4* 7.8* 8.4*   Prior cerebellar infarct  HLD Continue Eliquis  and statin as before  Hypothyroidism Continue Synthroid .  TSH in normal range Recent Labs    06/26/23 1513 03/01/24 0522  TSH 0.35 0.449   Restless leg syndrome  Continue pramipexole .  Peripheral neuropathy Gabapentin  100 mg daily, Mirapex  1 mg daily  H/o breast cancer  S/p lumpectomy  Follows up with oncologist  Venous stasis ulcer  on the right leg.   Wound team consult.  GERD  PPI.   Generalized weakness  PT eval obtained.  SNF recommended PT Follow up Rec: Skilled Nursing-Short Term Rehab (<3 Hours/Day)03/03/2024 1300    Goals of care   Code Status: Limited: Do not  attempt resuscitation (DNR) -DNR-LIMITED -Do Not Intubate/DNI    Diet:  Diet Order             Diet general           Diet heart healthy/carb modified Room service appropriate? Yes; Fluid consistency: Thin  Diet effective now                   Nutritional status:  Body mass index is 29.63 kg/m.       Wounds:  - Wound 03/01/24 1328 Vascular Ulcer Ankle Distal;Right;Lateral (Active)  Date First Assessed/Time First Assessed: 03/01/24 1328   Present on Original Admission: Yes  Primary Wound Type: Vascular Ulcer  Secondary Wound Type - Vascular Ulcer: Venous  Location: Ankle  Location Orientation: Distal;Right;Lateral    Assessments 03/01/2024  1:00 PM 03/05/2024  9:42 AM  Wound Image     Site / Wound Assessment -- Dressing in place / Unable to assess     No associated orders.     Wound 03/01/24 1402 Vascular Ulcer Heel Right (Active)  Date First Assessed/Time First Assessed: 03/01/24 1402   Present on Original Admission: Yes  Primary Wound Type: Vascular Ulcer  Location: Heel  Location Orientation: Right    Assessments 03/01/2024  2:03 PM 03/05/2024  9:42 AM  Wound Image  Site / Wound Assessment -- Dressing in place / Unable to assess     No associated orders.     Wound 03/01/24 1405 (Active)  Date First Assessed/Time First Assessed: 03/01/24 1405   Present on Original Admission: Yes    No assessment data to display     No associated orders.    Discharge Medications:   Allergies as of 03/05/2024       Reactions   Arthrotec [diclofenac -misoprostol] Shortness Of Breath, Diarrhea   Doxycycline Nausea And Vomiting, Rash   Significant enough to d/c treatment and refuse to take more Vomiting (intolerance) The first time she took it she had N/V and the second she had a rash.     Latex Rash, Other (See Comments)   Amlodipine    angioedema   Celebrex [celecoxib] Other (See Comments)   GI Bleeding   Vancomycin     Acute kidney failure   Diclofenac  Diarrhea    Shellfish Allergy Itching, Rash        Medication List     STOP taking these medications    cefdinir  300 MG capsule Commonly known as: OMNICEF    famotidine  40 MG tablet Commonly known as: PEPCID    metFORMIN  500 MG 24 hr tablet Commonly known as: GLUCOPHAGE -XR   metolazone  2.5 MG tablet Commonly known as: ZAROXOLYN    potassium chloride  SA 20 MEQ tablet Commonly known as: Klor-Con  M20   sulfamethoxazole -trimethoprim  400-80 MG tablet Commonly known as: BACTRIM        TAKE these medications    pramipexole  1 MG tablet Commonly known as: MIRAPEX  Take 1 tablet (1 mg total) by mouth every evening. The timing of this medication is very important.   acetaminophen  325 MG tablet Commonly known as: Tylenol  Take 2 tablets (650 mg total) by mouth every 4 (four) hours as needed.   apixaban  5 MG Tabs tablet Commonly known as: ELIQUIS  Take 1/2 tablet (2.5mg ) by mouth two times daily   ascorbic acid  500 MG tablet Commonly known as: VITAMIN C Take 500 mg by mouth 3 (three) times a week. Monday , Wednesday and Friday   benzonatate  100 MG capsule Commonly known as: TESSALON  Take 1 capsule (100 mg total) by mouth 3 (three) times daily for 5 days.   clotrimazole -betamethasone  cream Commonly known as: LOTRISONE  Apply 1 Application topically daily as needed or as directed What changed: reasons to take this   colchicine  0.6 MG tablet Take 1 tablet (0.6 mg total) by mouth daily.   feeding supplement Liqd Take 237 mLs by mouth 2 (two) times daily between meals.   ferrous sulfate  325 (65 FE) MG tablet Take 325 mg by mouth 2 (two) times daily with a meal.   furosemide  40 MG tablet Commonly known as: LASIX  Take 1 tablet (40 mg total) by mouth daily as needed. What changed:  when to take this reasons to take this   gabapentin  100 MG capsule Commonly known as: NEURONTIN  Take 200 mg by mouth 2 (two) times daily.   guaiFENesin -dextromethorphan  100-10 MG/5ML syrup Commonly  known as: ROBITUSSIN DM Take 5 mLs by mouth every 4 (four) hours as needed for cough.   insulin  aspart 100 UNIT/ML injection Commonly known as: novoLOG  Inject 0-9 Units into the skin 3 (three) times daily with meals.   levothyroxine  88 MCG tablet Commonly known as: SYNTHROID  Take 1 tablet (88 mcg total) by mouth daily before breakfast.   melatonin 5 MG Tabs Take 1 tablet (5 mg total) by mouth at bedtime as needed.  metoprolol  succinate 25 MG 24 hr tablet Commonly known as: TOPROL -XL Take 1 tablet (25 mg total) by mouth daily after supper. What changed:  medication strength how much to take when to take this additional instructions   nystatin  cream Commonly known as: MYCOSTATIN  Apply 1 Application topically 2 (two) times daily.   omeprazole  40 MG capsule Commonly known as: PRILOSEC Take 1 capsule (40 mg total) by mouth daily.   pravastatin  20 MG tablet Commonly known as: PRAVACHOL  Take 1 tablet (20 mg total) by mouth daily.   predniSONE  20 MG tablet Commonly known as: DELTASONE  Take 1 tablet (20 mg total) by mouth daily with breakfast for 2 days. Start taking on: March 06, 2024   triamcinolone  ointment 0.1 % Commonly known as: KENALOG  Apply 1 application topically 2 (two) times daily as needed (skin irritation.).   Vitamin D3 50 MCG (2000 UT) Tabs Take 2,000 Units by mouth daily.               Discharge Care Instructions  (From admission, onward)           Start     Ordered   03/05/24 0000  Discharge wound care:        03/05/24 0941             Follow ups:    Contact information for after-discharge care     Destination     [facility] .   Service: Skilled Nursing Contact information: [address] [place]  [address] [phone]                     Discharge Instructions:   Discharge Instructions     Call MD for:  difficulty breathing, headache or visual disturbances   Complete by: As directed     Call MD for:  extreme fatigue   Complete by: As directed    Call MD for:  hives   Complete by: As directed    Call MD for:  persistant dizziness or light-headedness   Complete by: As directed    Call MD for:  persistant nausea and vomiting   Complete by: As directed    Call MD for:  severe uncontrolled pain   Complete by: As directed    Call MD for:  temperature >100.4   Complete by: As directed    Diet general   Complete by: As directed    Discharge instructions   Complete by: As directed    Recommendations at discharge:   For cough, take 5 more days of scheduled Tessalon  Perles and as needed MucinexDM.   For gout, you have been started on 5 days of oral prednisone  20 mg daily, to complete the course on 1/15. Take Tylenol  as needed for pain control  Your hemoglobin A1c is well-controlled for age.  No need to continue metformin .   Given your risk of dehydration, no need to take scheduled diuretics.  Only use Lasix  as needed for shortness of breath and pedal edema.  Metoprolol  dose has been reduced to 25 mg daily.  General discharge instructions: Follow with Primary MD Almarie Waddell NOVAK, NP in 7 days  Please request your PCP  to go over your hospital tests, procedures, radiology results at the follow up. Please get your medicines reviewed and adjusted.  Your PCP may decide to repeat certain labs or tests as needed. Do not drive, operate heavy machinery, perform activities at heights, swimming or participation in water  activities or provide baby sitting  services if your were admitted for syncope or siezures until you have seen by Primary MD or a Neurologist and advised to do so again. [place]  Controlled Substance Reporting System database was reviewed. Do not drive, operate heavy machinery, perform activities at heights, swim, participate in water  activities or provide baby-sitting services while on medications for pain, sleep and mood until your outpatient physician has  reevaluated you and advised to do so again.  You are strongly recommended to comply with the dose, frequency and duration of prescribed medications. Activity: As tolerated with Full fall precautions use walker/cane & assistance as needed Avoid using any recreational substances like cigarette, tobacco, alcohol , or non-prescribed drug. If you experience worsening of your admission symptoms, develop shortness of breath, life threatening emergency, suicidal or homicidal thoughts you must seek medical attention immediately by calling 911 or calling your MD immediately  if symptoms less severe. You must read complete instructions/literature along with all the possible adverse reactions/side effects for all the medicines you take and that have been prescribed to you. Take any new medicine only after you have completely understood and accepted all the possible adverse reactions/side effects.  Wear Seat belts while driving. You were cared for by a hospitalist during your hospital stay. If you have any questions about your discharge medications or the care you received while you were in the hospital after you are discharged, you can call the unit and ask to speak with the hospitalist or the covering physician. Once you are discharged, your primary care physician will handle any further medical issues. Please note that NO REFILLS for any discharge medications will be authorized once you are discharged, as it is imperative that you return to your primary care physician (or establish a relationship with a primary care physician if you do not have one).   Discharge wound care:   Complete by: As directed    Increase activity slowly   Complete by: As directed        Discharge Exam:   Vitals:   03/04/24 1306 03/04/24 1748 03/04/24 2054 03/05/24 0628  BP: 96/62 119/82 103/83 118/81  Pulse: 93 88 86 79  Resp: 16  18 20   Temp: 98.2 F (36.8 C)  98.5 F (36.9 C) 97.7 F (36.5 C)  TempSrc: Oral  Oral Oral   SpO2: 94%  93% 94%  Weight:      Height:        Body mass index is 29.63 kg/m.  General exam: Pleasant, elderly Caucasian female.  Not in distress Skin: No rashes, lesions or ulcers. HEENT: Atraumatic, normocephalic, no obvious bleeding Lungs: Clear to auscultation bilaterally, coughs on deep breathing CVS: S1, S2, no murmur,   GI/Abd: Soft, nontender, nondistended, bowel sound present,   CNS: Alert, awake, oriented to place.  Able to answer yes/no questions and follow commands.   Psychiatry: Mood appropriate Extremities: No pedal edema, no calf tenderness.  Right hand little finger PIP with gouty tophi and surrounding redness-improving   The results of significant diagnostics from this hospitalization (including imaging, microbiology, ancillary and laboratory) are listed below for reference.    Procedures and Diagnostic Studies:   DG Hand 2 View Right Result Date: 03/01/2024 EXAM: 1 or 2 VIEW(S) XRAY OF THE RIGHT HAND 03/01/2024 06:25:00 PM COMPARISON: None available. CLINICAL HISTORY: Pain [id number] FINDINGS: BONES AND JOINTS: No acute fracture. No malalignment. Normal bone mineral density for age. Periarticular erosions are present at the right fifth proximal interphalangeal (PIP) joint. There is extensive surrounding  periarticular soft tissue swelling. These findings are in keeping with gouty arthritis or, less likely, septic arthritis. Moderate degenerative changes are noted at the first digit metacarpophalangeal joint. Severe degenerative changes are present at the first digit carpometacarpal joint. Severe degenerative changes are also seen at the second through fourth digit distal interphalangeal joints, the fifth digit proximal interphalangeal joint, and the second and third digit proximal interphalangeal joints. SOFT TISSUES: Extensive surrounding periarticular soft tissue swelling. IMPRESSION: 1. Periarticular erosions and extensive surrounding periarticular soft tissue swelling of the  right fifth PIP joint, most compatible with gouty arthritis, with septic arthritis considered less likely. 2. Severe degenerative changes of the first digit carpometacarpal joint, second through fourth digit distal interphalangeal joints, fifth digit proximal interphalangeal joint, and second and third digit proximal interphalangeal joints. 3. Moderate degenerative changes of the first digit metacarpophalangeal joint. Electronically signed by: Dorethia Molt MD MD 03/01/2024 10:09 PM EST RP Workstation: [id number]   CT HEAD WO CONTRAST ( ) Result Date: 02/29/2024 CLINICAL DATA:  Mental status change EXAM: CT HEAD WITHOUT CONTRAST TECHNIQUE: Contiguous axial images were obtained from the base of the skull through the vertex without intravenous contrast. RADIATION DOSE REDUCTION: This exam was performed according to the departmental dose-optimization program which includes automated exposure control, adjustment of the mA and/or kV according to patient size and/or use of iterative reconstruction technique. COMPARISON:  MRI report 06/20/2017 FINDINGS: Brain: No acute territorial infarction, hemorrhage or intracranial mass. Small chronic left cerebellar infarct. Mild atrophy and moderate chronic small vessel ischemic changes of the white matter. The ventricles are nonenlarged Vascular: No hyperdense vessels. Vertebral and carotid vascular calcification Skull: Normal. Negative for fracture or focal lesion. Sinuses/Orbits: No acute finding. Other: None IMPRESSION: 1. No CT evidence for acute intracranial abnormality. 2. Atrophy and chronic small vessel ischemic changes of the white matter. Small chronic left cerebellar infarct. Electronically Signed   By: Luke Bun M.D.   On: 02/29/2024 23:19   DG Chest Port 1 View Result Date: 02/29/2024 EXAM: 1 VIEW XRAY OF THE CHEST 02/29/2024 07:35:00 PM COMPARISON: Comparison with 03/25/2020. CLINICAL HISTORY: Cough, weakness, fatigue, and fever for 2 days. FINDINGS: LUNGS  AND PLEURA: Shallow inspiration. Lungs are clear. No pleural effusion. No pneumothorax. HEART AND MEDIASTINUM: Normal heart size and pulmonary vascularity. Calcification of the aorta. BONES AND SOFT TISSUES: Surgical clips in the left upper quadrant. No acute osseous abnormality. IMPRESSION: 1. No acute cardiopulmonary pathology. Electronically signed by: Elsie Gravely MD 02/29/2024 07:38 PM EST RP Workstation: [id number]     Labs:   Basic Metabolic Panel: Recent Labs  Lab 02/29/24 1953 03/01/24 0522 03/03/24 1603  NA 135 137 132*  K 3.6 3.4* 3.9  CL 98 100 98  CO2 24 24 22   GLUCOSE 95 74 96  BUN 27* 20 17  CREATININE 1.03* 0.79 0.91  CALCIUM  8.4* 7.8* 8.4*   GFR Estimated Creatinine Clearance: 32 mL/min (by C-G formula based on SCr of 0.91 mg/dL). Liver Function Tests: Recent Labs  Lab 02/29/24 1953 03/01/24 0522  AST 45* 39  ALT 36 31  ALKPHOS 80 68  BILITOT 0.3 0.3  PROT 6.7 5.8*  ALBUMIN  3.1* 3.0*   No results for input(s): LIPASE, AMYLASE in the last 168 hours. No results for input(s): AMMONIA in the last 168 hours. Coagulation profile No results for input(s): INR, PROTIME in the last 168 hours.  CBC: Recent Labs  Lab 02/29/24 1911 03/01/24 0522 03/03/24 1603  WBC 7.3 6.2 8.0  NEUTROABS 4.2  --  5.2  HGB 14.7 12.9 14.1  HCT 44.0 39.2 41.6  MCV 96.3 96.6 94.1  PLT 443* 378 344   Cardiac Enzymes: No results for input(s): CKTOTAL, CKMB, CKMBINDEX, TROPONINI in the last 168 hours. BNP: Invalid input(s): POCBNP CBG: Recent Labs  Lab 03/04/24 1144 03/04/24 1650 03/04/24 2105 03/05/24 0808 03/05/24 1225  GLUCAP 86 151* 133* 89 151*   D-Dimer No results for input(s): DDIMER in the last 72 hours. Hgb A1c No results for input(s): HGBA1C in the last 72 hours. Lipid Profile No results for input(s): CHOL, HDL, LDLCALC, TRIG, CHOLHDL, LDLDIRECT in the last 72 hours. Thyroid  function studies No results for input(s):  TSH, T4TOTAL, T3FREE, THYROIDAB in the last 72 hours.  Invalid input(s): FREET3 Anemia work up No results for input(s): VITAMINB12, FOLATE, FERRITIN, TIBC, IRON, RETICCTPCT in the last 72 hours. Microbiology Recent Results (from the past 240 hours)  Resp panel by RT-PCR (RSV, Flu A&B, Covid) Anterior Nasal Swab     Status: Abnormal   Collection Time: 02/29/24  8:08 PM   Specimen: Anterior Nasal Swab  Result Value Ref Range Status   SARS Coronavirus 2 by RT PCR POSITIVE (A) NEGATIVE Final    Comment: (NOTE) SARS-CoV-2 target nucleic acids are DETECTED.  The SARS-CoV-2 RNA is generally detectable in upper respiratory specimens during the acute phase of infection. Positive results are indicative of the presence of the identified virus, but do not rule out bacterial infection or co-infection with other pathogens not detected by the test. Clinical correlation with patient history and other diagnostic information is necessary to determine patient infection status. The expected result is Negative.  Fact Sheet for Patients: bloggercourse.com  Fact Sheet for Healthcare Providers: seriousbroker.it  This test is not yet approved or cleared by the [place]  FDA and  has been authorized for detection and/or diagnosis of SARS-CoV-2 by FDA under an Emergency Use Authorization (EUA).  This EUA will remain in effect (meaning this test can be used) for the duration of  the COVID-19 declaration under Section 564(b)(1) of the A ct, 21 U.S.C. section 360bbb-3(b)(1), unless the authorization is terminated or revoked sooner.     Influenza A by PCR NEGATIVE NEGATIVE Final   Influenza B by PCR NEGATIVE NEGATIVE Final    Comment: (NOTE) The Xpert Xpress SARS-CoV-2/FLU/RSV plus assay is intended as an aid in the diagnosis of influenza from Nasopharyngeal swab specimens and should not be used as a sole basis for treatment.  Nasal washings and aspirates are unacceptable for Xpert Xpress SARS-CoV-2/FLU/RSV testing.  Fact Sheet for Patients: bloggercourse.com  Fact Sheet for Healthcare Providers: seriousbroker.it  This test is not yet approved or cleared by the [place]  FDA and has been authorized for detection and/or diagnosis of SARS-CoV-2 by FDA under an Emergency Use Authorization (EUA). This EUA will remain in effect (meaning this test can be used) for the duration of the COVID-19 declaration under Section 564(b)(1) of the Act, 21 U.S.C. section 360bbb-3(b)(1), unless the authorization is terminated or revoked.     Resp Syncytial Virus by PCR NEGATIVE NEGATIVE Final    Comment: (NOTE) Fact Sheet for Patients: bloggercourse.com  Fact Sheet for Healthcare Providers: seriousbroker.it  This test is not yet approved or cleared by the [place]  FDA and has been authorized for detection and/or diagnosis of SARS-CoV-2 by FDA under an Emergency Use Authorization (EUA). This EUA will remain in effect (meaning this test can be used) for the duration of the COVID-19 declaration under Section 564(b)(1) of the  Act, 21 U.S.C. section 360bbb-3(b)(1), unless the authorization is terminated or revoked.  Performed at [facility], [address] [address], [place], [place] [address]     Time coordinating discharge: 45 minutes  Signed: Chapman Amaranta Mehl  [facility] 03/05/2024, 12:34 PM  "

## 2024-03-08 ENCOUNTER — Inpatient Hospital Stay (HOSPITAL_COMMUNITY)
Admission: EM | Admit: 2024-03-08 | Discharge: 2024-03-16 | DRG: 871 | Disposition: A | Discharge status: Skilled Nursing Facility | Source: Skilled Nursing Facility | Attending: Internal Medicine | Admitting: Internal Medicine

## 2024-03-08 ENCOUNTER — Encounter (HOSPITAL_COMMUNITY): Payer: Self-pay

## 2024-03-08 ENCOUNTER — Emergency Department (HOSPITAL_COMMUNITY)

## 2024-03-08 ENCOUNTER — Other Ambulatory Visit: Payer: Self-pay

## 2024-03-08 DIAGNOSIS — K219 Gastro-esophageal reflux disease without esophagitis: Secondary | ICD-10-CM | POA: Diagnosis present

## 2024-03-08 DIAGNOSIS — Z79899 Other long term (current) drug therapy: Secondary | ICD-10-CM

## 2024-03-08 DIAGNOSIS — Z794 Long term (current) use of insulin: Secondary | ICD-10-CM | POA: Diagnosis not present

## 2024-03-08 DIAGNOSIS — Z87891 Personal history of nicotine dependence: Secondary | ICD-10-CM

## 2024-03-08 DIAGNOSIS — R6520 Severe sepsis without septic shock: Secondary | ICD-10-CM | POA: Diagnosis present

## 2024-03-08 DIAGNOSIS — G2581 Restless legs syndrome: Secondary | ICD-10-CM | POA: Diagnosis present

## 2024-03-08 DIAGNOSIS — Z89429 Acquired absence of other toe(s), unspecified side: Secondary | ICD-10-CM

## 2024-03-08 DIAGNOSIS — Z886 Allergy status to analgesic agent status: Secondary | ICD-10-CM

## 2024-03-08 DIAGNOSIS — Z85828 Personal history of other malignant neoplasm of skin: Secondary | ICD-10-CM

## 2024-03-08 DIAGNOSIS — Z8673 Personal history of transient ischemic attack (TIA), and cerebral infarction without residual deficits: Secondary | ICD-10-CM | POA: Diagnosis not present

## 2024-03-08 DIAGNOSIS — Z8419 Family history of other disorders of kidney and ureter: Secondary | ICD-10-CM

## 2024-03-08 DIAGNOSIS — Z91013 Allergy to seafood: Secondary | ICD-10-CM

## 2024-03-08 DIAGNOSIS — E1122 Type 2 diabetes mellitus with diabetic chronic kidney disease: Secondary | ICD-10-CM | POA: Diagnosis present

## 2024-03-08 DIAGNOSIS — Z8249 Family history of ischemic heart disease and other diseases of the circulatory system: Secondary | ICD-10-CM

## 2024-03-08 DIAGNOSIS — Z96643 Presence of artificial hip joint, bilateral: Secondary | ICD-10-CM | POA: Diagnosis present

## 2024-03-08 DIAGNOSIS — Z7952 Long term (current) use of systemic steroids: Secondary | ICD-10-CM

## 2024-03-08 DIAGNOSIS — I509 Heart failure, unspecified: Secondary | ICD-10-CM | POA: Diagnosis not present

## 2024-03-08 DIAGNOSIS — A4189 Other specified sepsis: Secondary | ICD-10-CM | POA: Diagnosis present

## 2024-03-08 DIAGNOSIS — Z9081 Acquired absence of spleen: Secondary | ICD-10-CM

## 2024-03-08 DIAGNOSIS — Z79890 Hormone replacement therapy (postmenopausal): Secondary | ICD-10-CM

## 2024-03-08 DIAGNOSIS — Z881 Allergy status to other antibiotic agents status: Secondary | ICD-10-CM

## 2024-03-08 DIAGNOSIS — E1165 Type 2 diabetes mellitus with hyperglycemia: Secondary | ICD-10-CM | POA: Diagnosis present

## 2024-03-08 DIAGNOSIS — D649 Anemia, unspecified: Secondary | ICD-10-CM | POA: Diagnosis present

## 2024-03-08 DIAGNOSIS — E860 Dehydration: Secondary | ICD-10-CM | POA: Diagnosis present

## 2024-03-08 DIAGNOSIS — C50812 Malignant neoplasm of overlapping sites of left female breast: Secondary | ICD-10-CM | POA: Diagnosis present

## 2024-03-08 DIAGNOSIS — E785 Hyperlipidemia, unspecified: Secondary | ICD-10-CM | POA: Diagnosis present

## 2024-03-08 DIAGNOSIS — I872 Venous insufficiency (chronic) (peripheral): Secondary | ICD-10-CM | POA: Diagnosis present

## 2024-03-08 DIAGNOSIS — Z7901 Long term (current) use of anticoagulants: Secondary | ICD-10-CM

## 2024-03-08 DIAGNOSIS — N1832 Chronic kidney disease, stage 3b: Secondary | ICD-10-CM | POA: Diagnosis present

## 2024-03-08 DIAGNOSIS — Z66 Do not resuscitate: Secondary | ICD-10-CM | POA: Diagnosis present

## 2024-03-08 DIAGNOSIS — I130 Hypertensive heart and chronic kidney disease with heart failure and stage 1 through stage 4 chronic kidney disease, or unspecified chronic kidney disease: Secondary | ICD-10-CM | POA: Diagnosis present

## 2024-03-08 DIAGNOSIS — Z902 Acquired absence of lung [part of]: Secondary | ICD-10-CM

## 2024-03-08 DIAGNOSIS — I4821 Permanent atrial fibrillation: Secondary | ICD-10-CM | POA: Diagnosis present

## 2024-03-08 DIAGNOSIS — Z90710 Acquired absence of both cervix and uterus: Secondary | ICD-10-CM

## 2024-03-08 DIAGNOSIS — E039 Hypothyroidism, unspecified: Secondary | ICD-10-CM | POA: Diagnosis present

## 2024-03-08 DIAGNOSIS — J189 Pneumonia, unspecified organism: Secondary | ICD-10-CM

## 2024-03-08 DIAGNOSIS — U071 COVID-19: Secondary | ICD-10-CM | POA: Diagnosis present

## 2024-03-08 DIAGNOSIS — Z96653 Presence of artificial knee joint, bilateral: Secondary | ICD-10-CM | POA: Diagnosis present

## 2024-03-08 DIAGNOSIS — J9601 Acute respiratory failure with hypoxia: Secondary | ICD-10-CM | POA: Diagnosis present

## 2024-03-08 DIAGNOSIS — Z83438 Family history of other disorder of lipoprotein metabolism and other lipidemia: Secondary | ICD-10-CM

## 2024-03-08 DIAGNOSIS — Z9011 Acquired absence of right breast and nipple: Secondary | ICD-10-CM

## 2024-03-08 DIAGNOSIS — I10 Essential (primary) hypertension: Secondary | ICD-10-CM | POA: Diagnosis not present

## 2024-03-08 DIAGNOSIS — E782 Mixed hyperlipidemia: Secondary | ICD-10-CM | POA: Diagnosis not present

## 2024-03-08 DIAGNOSIS — M109 Gout, unspecified: Secondary | ICD-10-CM | POA: Diagnosis present

## 2024-03-08 DIAGNOSIS — M810 Age-related osteoporosis without current pathological fracture: Secondary | ICD-10-CM | POA: Diagnosis present

## 2024-03-08 DIAGNOSIS — Z853 Personal history of malignant neoplasm of breast: Secondary | ICD-10-CM

## 2024-03-08 DIAGNOSIS — Z888 Allergy status to other drugs, medicaments and biological substances status: Secondary | ICD-10-CM

## 2024-03-08 DIAGNOSIS — J1282 Pneumonia due to coronavirus disease 2019: Secondary | ICD-10-CM | POA: Diagnosis present

## 2024-03-08 DIAGNOSIS — I5032 Chronic diastolic (congestive) heart failure: Secondary | ICD-10-CM | POA: Diagnosis present

## 2024-03-08 DIAGNOSIS — A419 Sepsis, unspecified organism: Principal | ICD-10-CM

## 2024-03-08 DIAGNOSIS — I482 Chronic atrial fibrillation, unspecified: Secondary | ICD-10-CM

## 2024-03-08 DIAGNOSIS — Z91040 Latex allergy status: Secondary | ICD-10-CM

## 2024-03-08 DIAGNOSIS — H409 Unspecified glaucoma: Secondary | ICD-10-CM | POA: Diagnosis present

## 2024-03-08 LAB — COMPREHENSIVE METABOLIC PANEL WITH GFR
ALT: 38 U/L (ref 0–44)
AST: 37 U/L (ref 15–41)
Albumin: 2.9 g/dL — ABNORMAL LOW (ref 3.5–5.0)
Alkaline Phosphatase: 75 U/L (ref 38–126)
Anion gap: 11 (ref 5–15)
BUN: 18 mg/dL (ref 8–23)
CO2: 25 mmol/L (ref 22–32)
Calcium: 8.2 mg/dL — ABNORMAL LOW (ref 8.9–10.3)
Chloride: 104 mmol/L (ref 98–111)
Creatinine, Ser: 0.82 mg/dL (ref 0.44–1.00)
GFR, Estimated: >60 mL/min
Glucose, Bld: 120 mg/dL — ABNORMAL HIGH (ref 70–99)
Potassium: 3.6 mmol/L (ref 3.5–5.1)
Sodium: 139 mmol/L (ref 135–145)
Total Bilirubin: 0.5 mg/dL (ref 0.0–1.2)
Total Protein: 6.3 g/dL — ABNORMAL LOW (ref 6.5–8.1)

## 2024-03-08 LAB — CBC WITH DIFFERENTIAL/PLATELET
Abs Immature Granulocytes: 0.05 K/uL (ref 0.00–0.07)
Basophils Absolute: 0 K/uL (ref 0.0–0.1)
Basophils Relative: 0 %
Eosinophils Absolute: 0.1 K/uL (ref 0.0–0.5)
Eosinophils Relative: 1 %
HCT: 38.7 % (ref 36.0–46.0)
Hemoglobin: 12.7 g/dL (ref 12.0–15.0)
Immature Granulocytes: 1 %
Lymphocytes Relative: 20 %
Lymphs Abs: 1.5 K/uL (ref 0.7–4.0)
MCH: 31.7 pg (ref 26.0–34.0)
MCHC: 32.8 g/dL (ref 30.0–36.0)
MCV: 96.5 fL (ref 80.0–100.0)
Monocytes Absolute: 0.7 K/uL (ref 0.1–1.0)
Monocytes Relative: 9 %
Neutro Abs: 5.1 K/uL (ref 1.7–7.7)
Neutrophils Relative %: 69 %
Platelets: 257 K/uL (ref 150–400)
RBC: 4.01 MIL/uL (ref 3.87–5.11)
RDW: 13.2 % (ref 11.5–15.5)
WBC: 7.5 K/uL (ref 4.0–10.5)
nRBC: 0 % (ref 0.0–0.2)

## 2024-03-08 LAB — PROTIME-INR
INR: 1.2 (ref 0.8–1.2)
Prothrombin Time: 15.5 s — ABNORMAL HIGH (ref 11.4–15.2)

## 2024-03-08 LAB — PRO BRAIN NATRIURETIC PEPTIDE: Pro Brain Natriuretic Peptide: 6531 pg/mL — ABNORMAL HIGH

## 2024-03-08 LAB — RESP PANEL BY RT-PCR (RSV, FLU A&B, COVID)  RVPGX2
Influenza A by PCR: NEGATIVE
Influenza B by PCR: NEGATIVE
Resp Syncytial Virus by PCR: NEGATIVE
SARS Coronavirus 2 by RT PCR: POSITIVE — AB

## 2024-03-08 LAB — PROCALCITONIN: Procalcitonin: <0.1 ng/mL

## 2024-03-08 LAB — I-STAT CG4 LACTIC ACID, ED: Lactic Acid, Venous: 1.6 mmol/L (ref 0.5–1.9)

## 2024-03-08 MED ORDER — AZITHROMYCIN 250 MG PO TABS
500.0000 mg | ORAL_TABLET | Freq: Once | ORAL | Status: AC
  Administered 2024-03-08: 500 mg via ORAL
  Filled 2024-03-08: qty 2

## 2024-03-08 MED ORDER — POLYETHYLENE GLYCOL 3350 17 G PO PACK: 17.0000 g | PACK | Freq: Every day | ORAL | Status: DC | PRN

## 2024-03-08 MED ORDER — GUAIFENESIN 100 MG/5ML PO LIQD
10.0000 mL | Freq: Four times a day (QID) | ORAL | Status: DC | PRN
  Administered 2024-03-09 – 2024-03-13 (×2): 10 mL via ORAL
  Filled 2024-03-08 (×2): qty 10

## 2024-03-08 MED ORDER — SODIUM CHLORIDE 0.9 % IV SOLN: INTRAVENOUS | Status: AC | PRN

## 2024-03-08 MED ORDER — PRAVASTATIN SODIUM 40 MG PO TABS
20.0000 mg | ORAL_TABLET | Freq: Every day | ORAL | Status: DC
  Administered 2024-03-09 – 2024-03-16 (×8): 20 mg via ORAL
  Filled 2024-03-08 (×8): qty 1

## 2024-03-08 MED ORDER — SODIUM CHLORIDE 0.9% FLUSH
3.0000 mL | Freq: Two times a day (BID) | INTRAVENOUS | Status: DC
  Administered 2024-03-08 – 2024-03-15 (×11): 3 mL via INTRAVENOUS

## 2024-03-08 MED ORDER — SODIUM CHLORIDE 0.9 % IV BOLUS
1000.0000 mL | Freq: Once | INTRAVENOUS | Status: AC
  Administered 2024-03-08: 1000 mL via INTRAVENOUS

## 2024-03-08 MED ORDER — SODIUM CHLORIDE 0.9 % IV SOLN
2.0000 g | Freq: Once | INTRAVENOUS | Status: AC
  Administered 2024-03-08: 2 g via INTRAVENOUS
  Filled 2024-03-08: qty 12.5

## 2024-03-08 MED ORDER — SODIUM CHLORIDE 0.9 % IV SOLN
2.0000 g | INTRAVENOUS | Status: AC
  Administered 2024-03-08 – 2024-03-12 (×5): 2 g via INTRAVENOUS
  Filled 2024-03-08 (×5): qty 20

## 2024-03-08 MED ORDER — ACETAMINOPHEN 325 MG PO TABS
650.0000 mg | ORAL_TABLET | Freq: Four times a day (QID) | ORAL | Status: DC | PRN
  Filled 2024-03-08: qty 2

## 2024-03-08 MED ORDER — GUAIFENESIN 100 MG/5ML PO LIQD
10.0000 mL | Freq: Once | ORAL | Status: AC
  Administered 2024-03-08: 10 mL via ORAL
  Filled 2024-03-08: qty 10

## 2024-03-08 MED ORDER — PRAMIPEXOLE DIHYDROCHLORIDE 1 MG PO TABS
1.0000 mg | ORAL_TABLET | Freq: Every evening | ORAL | Status: DC
  Administered 2024-03-09 – 2024-03-15 (×7): 1 mg via ORAL
  Filled 2024-03-08 (×9): qty 1

## 2024-03-08 MED ORDER — COLCHICINE 0.6 MG PO TABS
0.6000 mg | ORAL_TABLET | Freq: Every day | ORAL | Status: DC
  Administered 2024-03-09 – 2024-03-16 (×8): 0.6 mg via ORAL
  Filled 2024-03-08 (×8): qty 1

## 2024-03-08 MED ORDER — VANCOMYCIN HCL 1500 MG/300ML IV SOLN
1500.0000 mg | Freq: Once | INTRAVENOUS | Status: AC
  Administered 2024-03-08: 1500 mg via INTRAVENOUS
  Filled 2024-03-08: qty 300

## 2024-03-08 MED ORDER — ENSURE PLUS HIGH PROTEIN PO LIQD
237.0000 mL | Freq: Two times a day (BID) | ORAL | Status: DC
  Administered 2024-03-09 – 2024-03-16 (×11): 237 mL via ORAL

## 2024-03-08 MED ORDER — LEVOTHYROXINE SODIUM 88 MCG PO TABS
88.0000 ug | ORAL_TABLET | Freq: Every day | ORAL | Status: DC
  Administered 2024-03-09 – 2024-03-16 (×8): 88 ug via ORAL
  Filled 2024-03-08 (×8): qty 1

## 2024-03-08 MED ORDER — AZITHROMYCIN 250 MG PO TABS
500.0000 mg | ORAL_TABLET | Freq: Every day | ORAL | Status: DC
  Administered 2024-03-09 – 2024-03-14 (×6): 500 mg via ORAL
  Filled 2024-03-08: qty 2
  Filled 2024-03-08 (×4): qty 1
  Filled 2024-03-08: qty 2

## 2024-03-08 MED ORDER — PANTOPRAZOLE SODIUM 40 MG PO TBEC
40.0000 mg | DELAYED_RELEASE_TABLET | Freq: Every day | ORAL | Status: DC
  Administered 2024-03-09 – 2024-03-16 (×8): 40 mg via ORAL
  Filled 2024-03-08 (×8): qty 1

## 2024-03-08 MED ORDER — APIXABAN 2.5 MG PO TABS
2.5000 mg | ORAL_TABLET | Freq: Two times a day (BID) | ORAL | Status: DC
  Administered 2024-03-08 – 2024-03-16 (×16): 2.5 mg via ORAL
  Filled 2024-03-08 (×17): qty 1

## 2024-03-08 MED ORDER — SODIUM CHLORIDE 0.9 % IV SOLN: 1.0000 g | Freq: Once | INTRAVENOUS | Status: DC

## 2024-03-08 MED ORDER — METOPROLOL TARTRATE 5 MG/5ML IV SOLN
2.5000 mg | Freq: Once | INTRAVENOUS | Status: DC
  Filled 2024-03-08: qty 5

## 2024-03-08 MED ORDER — GABAPENTIN 100 MG PO CAPS
200.0000 mg | ORAL_CAPSULE | Freq: Two times a day (BID) | ORAL | Status: DC
  Administered 2024-03-08 – 2024-03-16 (×16): 200 mg via ORAL
  Filled 2024-03-08 (×16): qty 2

## 2024-03-08 MED ORDER — MELATONIN 5 MG PO TABS
5.0000 mg | ORAL_TABLET | Freq: Every evening | ORAL | Status: DC | PRN
  Administered 2024-03-11 – 2024-03-15 (×2): 5 mg via ORAL
  Filled 2024-03-08 (×3): qty 1

## 2024-03-08 MED ORDER — INSULIN ASPART 100 UNIT/ML IJ SOLN
0.0000 [IU] | Freq: Three times a day (TID) | INTRAMUSCULAR | Status: DC
  Administered 2024-03-09: 2 [IU] via SUBCUTANEOUS
  Administered 2024-03-09 – 2024-03-10 (×2): 1 [IU] via SUBCUTANEOUS
  Administered 2024-03-11: 2 [IU] via SUBCUTANEOUS
  Administered 2024-03-11 – 2024-03-12 (×4): 1 [IU] via SUBCUTANEOUS
  Administered 2024-03-12 – 2024-03-13 (×2): 2 [IU] via SUBCUTANEOUS
  Administered 2024-03-13: 3 [IU] via SUBCUTANEOUS
  Administered 2024-03-14: 1 [IU] via SUBCUTANEOUS
  Administered 2024-03-14: 2 [IU] via SUBCUTANEOUS
  Administered 2024-03-15: 1 [IU] via SUBCUTANEOUS
  Administered 2024-03-15: 2 [IU] via SUBCUTANEOUS
  Administered 2024-03-16: 1 [IU] via SUBCUTANEOUS
  Filled 2024-03-08: qty 1
  Filled 2024-03-08: qty 2
  Filled 2024-03-08 (×2): qty 1
  Filled 2024-03-08: qty 2
  Filled 2024-03-08 (×4): qty 1
  Filled 2024-03-08: qty 3
  Filled 2024-03-08 (×5): qty 1
  Filled 2024-03-08: qty 2

## 2024-03-08 MED ORDER — METOPROLOL SUCCINATE ER 25 MG PO TB24
25.0000 mg | ORAL_TABLET | Freq: Every day | ORAL | Status: DC
  Administered 2024-03-08 – 2024-03-15 (×8): 25 mg via ORAL
  Filled 2024-03-08 (×8): qty 1

## 2024-03-08 MED ORDER — ACETAMINOPHEN 650 MG RE SUPP: 650.0000 mg | Freq: Four times a day (QID) | RECTAL | Status: DC | PRN

## 2024-03-08 NOTE — ED Provider Notes (Signed)
 " [facility] Provider Note  CSN: [id number] Arrival date & time: 03/08/24 1411  Chief Complaint(s) Fever and Altered Mental Status  HPI Shuntay Everetts is a 89 y.o. female who is here today for fever, shortness of breath and reported altered mental status of the patient is alert and oriented x 4 for me.  History obtained via EMS.  They report that the patient was recently discharged after a COVID-19 admission.  She was discharged on 1/13.  Reportedly she has been febrile, seemed a bit more confused at her rehab facility, is now requiring supplemental O2.   Past Medical History Past Medical History:  Diagnosis Date   Acute CHF (congestive heart failure) (HCC) 04/22/2020   Acute kidney failure 2016 to 2017 due to vacomycin   no kidney issues now   Anemia    Antibiotic-induced nephrotoxicity 05/26/2020   Anxiety    ARF (acute renal failure) 04/22/2020   Arthritis    Atrial fibrillation (HCC)    CHF (congestive heart failure) (HCC) 04/22/2020   CHF exacerbation (HCC) 04/23/2020   Chronic venous insufficiency 03/01/2019   Last Assessment & Plan:   Formatting of this note might be different from the original.  CVI:       Frequent leg elevation above heart level for 30 minutes each session throughout the day     Controlling swelling is lifelong goal to reduce risk of recurrent venous leg ulcers.       Avoid sodium and processed foods to reduce swelling.      Lifelong compression advised once RLE dermatitis is resolve   Controlled type 2 diabetes mellitus with hyperglycemia, without long-term current use of insulin  (HCC) 10/18/2017   Degeneration of lumbar intervertebral disc 12/02/2019   Degenerative spondylolisthesis 12/02/2019   Depression    Diabetes mellitus without complication (HCC)    Diabetic ulcer of toe of right foot associated with type 2 diabetes mellitus, with fat layer exposed (HCC) 02/08/2019   Last Assessment & Plan:   Formatting of  this note is different from the original.  Patient tolerated selective debridement as above. Left dorsum foot ulcer making good progress toward closure.      According to recent blood flow studys patient bloodflow is adequate for healing, bilaterally.      We have reinforced purpose and importance of complete offloading and glycemic control.     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 macular degeneration of left eye 10/18/2017   Left leg cellulitis 01/18/2020   Lymphedema of both lower extremities 12/26/2014   Malignant neoplasm of overlapping sites of left female breast (HCC) 09/26/2022   OA (osteoarthritis) of hip 02/28/2018   Osteopenia 08/26/2021   Osteoporosis 09/16/2021   Other specified acquired hypothyroidism    Pain in joint of right hip 02/06/2018   Pain in left knee 11/20/2019  Permanent atrial fibrillation (HCC)    Pneumonia    Post-menopause 08/26/2021   Primary open angle glaucoma (POAG) of both eyes, indeterminate stage 10/18/2017   Restless leg    Restless leg    Restless leg syndrome 06/05/2012   Retinal artery branch occlusion of right eye 06/12/2017   Last Assessment & Plan:   Formatting of this note might be different from the original.  Relevant Hx:  Course:  Daily Update:  Today's Plan:     Right ankle pain 08/26/2021    Scoliosis deformity of spine 12/02/2019   Skin cancer    Forehead   Stage 3b chronic kidney disease (HCC) 02/23/2021   Status post amputation of lesser toe of right foot 05/13/2017   Formatting of this note might be different from the original. 05/13/17 Op note-Mrs. Perret is a very pleasant 89y/o female who presented to the ED with infection to her right foot. Xrays and MRI concerning for acute osteomyelitis of right 5th toe.Proceed with amputation of right 5th toe to help ensure quick resolution of infection to help prevent progression.     Stroke (HCC) 05/2017   ocular stoke on plavix  , right eye some vision loss   Vaginal vault prolapse 08/12/2014   Venous hypertension of both lower extremities 12/26/2014   Venous stasis dermatitis of left lower extremity 06/05/2018   Venous stasis ulcer (HCC)    Patient Active Problem List   Diagnosis Date Noted   Gout 03/01/2024   Atrial fibrillation with RVR (HCC) 03/01/2024   COVID-19 02/29/2024   Malignant neoplasm of overlapping sites of left female breast (HCC) 09/26/2022   Osteoporosis 09/16/2021   Frequent UTI 06/25/2021   Stage 3b chronic kidney disease (HCC) 02/23/2021   Chronic anticoagulation 05/12/2020   Venous stasis ulcer (HCC)    Skin cancer    Glaucoma    Food allergy    Permanent atrial fibrillation (HCC)    Arthritis    Anemia    CHF (congestive heart failure) (HCC) 04/22/2020   History of revision of total replacement of left hip joint 01/31/2020   Hypothyroidism 01/19/2020   Hyperlipidemia 01/19/2020   Degeneration of lumbar intervertebral disc 12/02/2019   Degenerative spondylolisthesis 12/02/2019   Scoliosis deformity of spine 12/02/2019   Back pain 08/24/2019   Chronic venous insufficiency 03/01/2019   Diabetic ulcer of toe of right foot associated with type 2 diabetes mellitus, with fat layer exposed (HCC) 02/08/2019   Venous stasis dermatitis of left lower extremity 06/05/2018   OA (osteoarthritis) of hip  02/28/2018   Pain in joint of right hip 02/06/2018   Controlled type 2 diabetes mellitus with hyperglycemia, without long-term current use of insulin  (HCC) 10/18/2017   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 macular degeneration of left eye 10/18/2017   Primary open angle glaucoma (POAG) of both eyes, indeterminate stage 10/18/2017   Retinal artery branch occlusion of right eye 06/12/2017   Stroke (HCC) 05/2017   Status post amputation of lesser toe of right foot 05/13/2017   Lymphedema of both lower extremities 12/26/2014   Venous hypertension of both lower extremities 12/26/2014   Vaginal vault prolapse 08/12/2014   GERD (gastroesophageal reflux disease) 11/13/2012   Essential hypertension, benign 06/17/2012   Restless leg syndrome 06/05/2012   Home Medication(s) Prior to Admission medications  Medication Sig Start Date End Date Taking? Authorizing Provider  acetaminophen  (TYLENOL ) 325 MG tablet Take 2 tablets (650 mg total) by mouth every 4 (four) hours as needed. 03/05/24 03/05/25  Arlice Reichert, MD  apixaban  (ELIQUIS ) 5 MG TABS tablet Take 1/2 tablet (2.5mg ) by mouth two times daily 01/22/24   Pietro Redell RAMAN, MD  benzonatate  (TESSALON ) 100 MG capsule Take 1 capsule (100 mg total) by mouth 3 (three) times daily for 5 days. 03/05/24 03/10/24  Arlice Reichert, MD  Cholecalciferol  (VITAMIN D3) 50 MCG (2000 UT) TABS Take 2,000 Units by mouth daily.    [provider]  clotrimazole -betamethasone  (LOTRISONE ) cream Apply 1 Application topically daily as needed or as directed Patient taking differently: Apply 1 Application topically daily as needed (rash). 05/04/22     colchicine  0.6 MG tablet Take 1 tablet (0.6 mg total) by mouth daily. 12/28/23   Pietro Redell RAMAN, MD  feeding supplement (ENSURE PLUS HIGH PROTEIN) LIQD Take 237 mLs by mouth 2 (two) times daily between meals. 03/05/24   Arlice Reichert, MD  ferrous sulfate  325 (65 FE) MG tablet Take 325 mg by mouth 2 (two) times daily  with a meal. 07/11/17   [provider]  furosemide  (LASIX ) 40 MG tablet Take 1 tablet (40 mg total) by mouth daily as needed. 03/05/24   Arlice Reichert, MD  gabapentin  (NEURONTIN ) 100 MG capsule Take 200 mg by mouth 2 (two) times daily. 02/20/24 02/19/25  [provider]  guaiFENesin -dextromethorphan  (ROBITUSSIN DM) 100-10 MG/5ML syrup Take 5 mLs by mouth every 4 (four) hours as needed for cough. 03/05/24   Arlice Reichert, MD  insulin  aspart (NOVOLOG ) 100 UNIT/ML injection Inject 0-9 Units into the skin 3 (three) times daily with meals. 03/05/24   Arlice Reichert, MD  levothyroxine  (SYNTHROID ) 88 MCG tablet Take 1 tablet (88 mcg total) by mouth daily before breakfast. 06/26/23   Almarie Birmingham B, NP  melatonin 5 MG TABS Take 1 tablet (5 mg total) by mouth at bedtime as needed. 03/05/24   Arlice Reichert, MD  metoprolol  succinate (TOPROL -XL) 25 MG 24 hr tablet Take 1 tablet (25 mg total) by mouth daily after supper. 03/05/24   Arlice Reichert, MD  nystatin  cream (MYCOSTATIN ) Apply 1 Application topically 2 (two) times daily. Patient not taking: Reported on 02/29/2024 06/26/23   Almarie Birmingham NOVAK, NP  omeprazole  (PRILOSEC) 40 MG capsule Take 1 capsule (40 mg total) by mouth daily. 06/26/23   Almarie Birmingham NOVAK, NP  pramipexole  (MIRAPEX ) 1 MG tablet Take 1 tablet (1 mg total) by mouth every evening. 06/26/23   Almarie Birmingham NOVAK, NP  pravastatin  (PRAVACHOL ) 20 MG tablet Take 1 tablet (20 mg total) by mouth daily. 12/28/23   Pietro Redell RAMAN, MD  predniSONE  (DELTASONE ) 20 MG tablet Take 1 tablet (20 mg total) by mouth daily with breakfast for 2 days. 03/06/24 03/08/24  Arlice Reichert, MD  triamcinolone  ointment (KENALOG ) 0.1 % Apply 1 application topically 2 (two) times daily as needed (skin irritation.).    [provider]  vitamin C (ASCORBIC ACID ) 500 MG tablet Take 500 mg by mouth 3 (three) times a week. Monday , Wednesday and Friday    [provider]  Past Surgical History Past Surgical History:  Procedure Laterality Date   ABDOMINAL HYSTERECTOMY  1993   Uterine Prolapse partial   BREAST BIOPSY Left 08/26/2022   MM LT BREAST BX W LOC DEV 1ST LESION IMAGE BX SPEC STEREO GUIDE 08/26/2022 [facility]   BREAST BIOPSY Left 08/26/2022   US  LT BREAST BX W LOC DEV 1ST LESION IMG BX SPEC US  GUIDE 08/26/2022 [facility]   BREAST BIOPSY  10/25/2022   MM LT RADIOACTIVE SEED LOC MAMMO GUIDE 10/25/2022 [facility]   BREAST LUMPECTOMY WITH RADIOACTIVE SEED LOCALIZATION Left 10/27/2022   Procedure: LEFT BREAST LUMPECTOMY WITH RADIOACTIVE SEED LOCALIZATION;  Surgeon: Vernetta Berg, MD;  Location: [facility];  Service: General;  Laterality: Left;  LMA   MASTECTOMY  1974   right breast not sure if cancer 1960's   REPLACEMENT TOTAL KNEE BILATERAL Bilateral    SMALL TOE RIGHT FOOT REMOVED  04/2017   DUE TO INFECTION   Spleenectomy  2000   TOTAL HIP ARTHROPLASTY Right 02/28/2018   Procedure: RIGHT TOTAL HIP ARTHROPLASTY ANTERIOR APPROACH;  Surgeon: Melodi Lerner, MD;  Location: [facility];  Service: Orthopedics;  Laterality: Right;    TOTAL HIP ARTHROPLASTY Left 01/22/2020   Procedure: TOTAL HIP ARTHROPLASTY ANTERIOR APPROACH;  Surgeon: Melodi Lerner, MD;  Location: [facility];  Service: Orthopedics;  Laterality: Left;    TOTAL KNEE ARTHROPLASTY WITH REVISION COMPONENTS     Family History Family History  Problem Relation Age of Onset   Cancer Mother    Heart disease Father    Hyperlipidemia Father    Aneurysm Sister    Kidney disease Brother     Social History Social History[1] Allergies Arthrotec [diclofenac -misoprostol], Doxycycline, Latex, Amlodipine, Celebrex [celecoxib], Vancomycin , Diclofenac , and Shellfish allergy  Review of Systems Review of Systems  Physical Exam Vital Signs  I have reviewed the triage vital signs BP  100/66   Pulse (!) 101   Temp 100.3 F (37.9 C) (Oral)   Resp 20   SpO2 97%   Physical Exam Vitals reviewed.  Constitutional:      Appearance: She is ill-appearing and toxic-appearing.  HENT:     Mouth/Throat:     Mouth: Mucous membranes are moist.  Cardiovascular:     Rate and Rhythm: Tachycardia present. Rhythm irregular.  Pulmonary:     Effort: Respiratory distress present.     Breath sounds: Rales present. No wheezing.  Abdominal:     General: Abdomen is flat. There is no distension.     Palpations: Abdomen is soft.     Tenderness: There is no abdominal tenderness. There is no guarding.  Musculoskeletal:        General: Normal range of motion.     Cervical back: Normal range of motion.  Skin:    General: Skin is warm.     Findings: No bruising or lesion.  Neurological:     Mental Status: She is oriented to person, place, and time.     ED Results and Treatments Labs (all labs ordered are listed, but only abnormal results are displayed) Labs Reviewed  PROTIME-INR - Abnormal; Notable for the following components:      Result Value   Prothrombin Time 15.5 (*)    All other components within normal limits  CULTURE, BLOOD (ROUTINE X 2)  CULTURE, BLOOD (ROUTINE X 2)  RESP PANEL BY RT-PCR (RSV, FLU A&B, COVID)  RVPGX2  MRSA CULTURE  CBC WITH DIFFERENTIAL/PLATELET  COMPREHENSIVE METABOLIC PANEL WITH GFR  I-STAT  CG4 LACTIC ACID, ED                                                                                                                          Radiology No results found.  Pertinent labs & imaging results that were available during my care of the patient were reviewed by me and considered in my medical decision making (see MDM for details).  Medications Ordered in ED Medications  azithromycin  (ZITHROMAX ) tablet 500 mg (has no administration in time range)  sodium chloride  0.9 % bolus 1,000 mL (has no administration in time range)                                                                                                                                      Procedures .Critical Care  Performed by: Mannie Fairy DASEN, DO Authorized by: Mannie Fairy DASEN, DO   Critical care provider statement:    Critical care time (minutes):  35   Critical care was necessary to treat or prevent imminent or life-threatening deterioration of the following conditions:  Sepsis and respiratory failure   Critical care was time spent personally by me on the following activities:  Development of treatment plan with patient or surrogate, discussions with consultants, evaluation of patient's response to treatment, examination of patient, ordering and review of laboratory studies, ordering and review of radiographic studies, ordering and performing treatments and interventions, pulse oximetry, re-evaluation of patient's condition and review of old charts   (including critical care time)  Medical Decision Making / ED Course   This patient presents to the ED for concern of shortness of breath and fever, this involves an extensive number of treatment options, and is a complaint that carries with it a high risk of complications and morbidity.  The differential diagnosis includes pneumonia, viral syndrome, COVID-19, less likely PE, less Eckley intra-abdominal infection, less likely cystitis.  MDM: 89 year old female here today for fever and shortness of breath.  She is unfortunately requiring supplemental O2 at 2 L.  When I turned the patient's oxygen off, her O2 sat drops to 91%.  She does not wear oxygen at baseline.  Given her recent COVID-19 infection, I do have concern for subsequent pneumonia.  Patient was also recently discharged, do have concern for hospital-acquired pneumonia.  Will start the patient on broad-spectrum antibiotics, MRSA culture sent.  Out of concern for fluid overload,  will provide the patient with 1 L of IV fluids and reassess.  Patient was signed out  to Dr. Elnor pending remainder of workup and admission.    Additional history obtained: -Additional history obtained from family at bedside -External records from outside source obtained and reviewed including: Chart review including previous notes, labs, imaging, consultation notes   Lab Tests: -I ordered, reviewed, and interpreted labs.   The pertinent results include:   Labs Reviewed  PROTIME-INR - Abnormal; Notable for the following components:      Result Value   Prothrombin Time 15.5 (*)    All other components within normal limits  CULTURE, BLOOD (ROUTINE X 2)  CULTURE, BLOOD (ROUTINE X 2)  RESP PANEL BY RT-PCR (RSV, FLU A&B, COVID)  RVPGX2  MRSA CULTURE  CBC WITH DIFFERENTIAL/PLATELET  COMPREHENSIVE METABOLIC PANEL WITH GFR  I-STAT CG4 LACTIC ACID, ED      EKG   EKG Interpretation Date/Time:  Friday March 08 2024 14:39:54 EST Ventricular Rate:  108 PR Interval:    QRS Duration:  80 QT Interval:  355 QTC Calculation: 476 R Axis:   128  Text Interpretation: Atrial fibrillation Ventricular premature complex Probable right ventricular hypertrophy Borderline T abnormalities, diffuse leads Confirmed by Mannie Pac [phone]) on 03/08/2024 3:01:56 PM         Imaging Studies ordered: I ordered imaging studies including chest x-ray I independently visualized and interpreted imaging. I agree with the radiologist interpretation   Medicines ordered and prescription drug management: Meds ordered this encounter  Medications   DISCONTD: cefTRIAXone  (ROCEPHIN ) 1 g in sodium chloride  0.9 % 100 mL IVPB    Antibiotic Indication::   CAP   azithromycin  (ZITHROMAX ) tablet 500 mg   sodium chloride  0.9 % bolus 1,000 mL    -I have reviewed the patients home medicines and have made adjustments as needed  Critical interventions Agement of sepsis and respiratory failure     Cardiac Monitoring: The patient was maintained on a cardiac monitor.  I personally viewed and  interpreted the cardiac monitored which showed an underlying rhythm of: Atrial fibrillation   Reevaluation: After the interventions noted above, I reevaluated the patient and found that they have :improved  Co morbidities that complicate the patient evaluation  Past Medical History:  Diagnosis Date   Acute CHF (congestive heart failure) (HCC) 04/22/2020   Acute kidney failure 2016 to 2017 due to vacomycin   no kidney issues now   Anemia    Antibiotic-induced nephrotoxicity 05/26/2020   Anxiety    ARF (acute renal failure) 04/22/2020   Arthritis    Atrial fibrillation (HCC)    CHF (congestive heart failure) (HCC) 04/22/2020   CHF exacerbation (HCC) 04/23/2020   Chronic venous insufficiency 03/01/2019   Last Assessment & Plan:   Formatting of this note might be different from the original.  CVI:       Frequent leg elevation above heart level for 30 minutes each session throughout the day     Controlling swelling is lifelong goal to reduce risk of recurrent venous leg ulcers.       Avoid sodium and processed foods to reduce swelling.      Lifelong compression advised once RLE dermatitis is resolve   Controlled type 2 diabetes mellitus with hyperglycemia, without long-term current use of insulin  (HCC) 10/18/2017   Degeneration of lumbar intervertebral disc 12/02/2019   Degenerative spondylolisthesis 12/02/2019   Depression    Diabetes mellitus without complication (HCC)    Diabetic ulcer of  toe of right foot associated with type 2 diabetes mellitus, with fat layer exposed (HCC) 02/08/2019   Last Assessment & Plan:   Formatting of this note is different from the original.  Patient tolerated selective debridement as above. Left dorsum foot ulcer making good progress toward closure.      According to recent blood flow studys patient bloodflow is adequate for healing, bilaterally.      We have reinforced purpose and importance of complete offloading and glycemic control.     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 macular degeneration of left eye 10/18/2017   Left leg cellulitis 01/18/2020   Lymphedema of both lower extremities 12/26/2014   Malignant neoplasm of overlapping sites of left female breast (HCC) 09/26/2022   OA (osteoarthritis) of hip 02/28/2018   Osteopenia 08/26/2021   Osteoporosis 09/16/2021   Other specified acquired hypothyroidism    Pain in joint of right hip 02/06/2018   Pain in left knee 11/20/2019   Permanent atrial fibrillation (HCC)    Pneumonia    Post-menopause 08/26/2021   Primary open angle glaucoma (POAG) of both eyes, indeterminate stage 10/18/2017   Restless leg    Restless leg    Restless leg syndrome 06/05/2012   Retinal artery branch occlusion of right eye 06/12/2017   Last Assessment & Plan:   Formatting of this note might be different from the original.  Relevant Hx:  Course:  Daily Update:  Today's Plan:     Right ankle pain 08/26/2021   Scoliosis deformity of spine 12/02/2019   Skin cancer    Forehead   Stage 3b chronic kidney disease (HCC) 02/23/2021   Status post amputation of lesser toe of right foot 05/13/2017   Formatting of this note might be different from the original. 05/13/17 Op note-Mrs. Grassel is a very pleasant 89y/o female who presented to the ED with infection to her right foot.  Xrays and MRI concerning for acute osteomyelitis of right 5th toe.Proceed with amputation of right 5th toe to help ensure quick resolution of infection to help prevent progression.     Stroke (HCC) 05/2017   ocular stoke on plavix  , right eye some vision loss   Vaginal vault prolapse 08/12/2014   Venous hypertension of both lower extremities 12/26/2014   Venous stasis dermatitis of left lower extremity 06/05/2018   Venous stasis ulcer (HCC)         Final Clinical Impression(s) / ED Diagnoses Final diagnoses:  Sepsis with acute hypoxic respiratory failure without septic shock, due to unspecified organism (HCC)     @PCDICTATION @     [1]  Social History Tobacco Use   Smoking status: Former    Current packs/day: 0.00    Average packs/day: 1 pack/day for 30.0 years (30.0 ttl pk-yrs)    Types: Cigarettes    Start date: 46    Quit date:  1974    Years since quitting: 52.0    Passive exposure: Past   Smokeless tobacco: Never   Tobacco comments:    qiot 50 yrs ago  Vaping Use   Vaping status: Never Used  Substance Use Topics   Alcohol  use: Not Currently   Drug use: No     Mannie Pac T, DO 03/08/24 1528  "

## 2024-03-08 NOTE — ED Provider Notes (Signed)
" °  Provider Note MRN:  [id number]  Arrival date & time: 03/08/24    ED Course and Medical Decision Making  Assumed care from Dr Mannie at shift change.  See note from prior team for complete details, in brief:   Clinical Course as of 03/08/24 1659  Fri Mar 08, 2024  1507 Handoff JS 89 yo/f coming from SNF Concern PNA Recently admitted for C19, d/c on 1/13 Hx HTN, CHF, Afib, DM Here w/ fever, ams, tachycardia, hypoxic Hypoxic on RA at SNF, increased fatigue, fever 91% on RA, on 2L now Sepsis, presumed resp [SG]  1625 LA not elev She is HDS, doubt septic shock  [SG]  1625 CXR concerning for developing PNA  She has new o2 requirement  Broad spectrum abx started by prior team   [SG]  1625 Plan admission for sepsis 2/2 PNA [SG]    Clinical Course User Index [SG] Elnor Savant A, DO     .Critical Care  Performed by: Elnor Savant LABOR, DO Authorized by: Elnor Savant LABOR, DO   Critical care provider statement:    Critical care time (minutes):  30   Critical care time was exclusive of:  Separately billable procedures and treating other patients   Critical care was necessary to treat or prevent imminent or life-threatening deterioration of the following conditions:  Sepsis and respiratory failure   Critical care was time spent personally by me on the following activities:  Development of treatment plan with patient or surrogate, discussions with consultants, evaluation of patient's response to treatment, examination of patient, ordering and review of laboratory studies, ordering and review of radiographic studies, ordering and performing treatments and interventions, pulse oximetry, re-evaluation of patient's condition, review of old charts and obtaining history from patient or surrogate   Care discussed with: admitting provider     Final Clinical Impressions(s) / ED Diagnoses     ICD-10-CM   1. Sepsis with acute hypoxic respiratory failure without septic shock, due to unspecified  organism (HCC)  A41.9    R65.20    J96.01     2. Community acquired pneumonia, unspecified laterality  J18.9       ED Discharge Orders     None       Discharge Instructions   None        Elnor Savant LABOR, DO 03/08/24 1659  "

## 2024-03-08 NOTE — H&P (Signed)
 " History and Physical   Dana Bradford FMW:[id number] DOB: [birth year] DOA: 03/08/2024  PCP: Almarie Waddell NOVAK, NP   Patient coming from: SNF  Chief Complaint: Fever, shortness of breath, ?AMS  HPI: Dana Bradford is a 89 y.o. female with medical history significant of hypertension, hyperlipidemia, diabetes, GERD, atrial fibrillation, hypothyroidism, CKD 3B, CVA, chronic CHF, macular degeneration, glaucoma, gout, anemia, breast cancer status postlumpectomy, venous insufficiency, venous insufficiency ulcer, RLS, recurrent UTI presenting with fever, shortness of breath, altered mental status.  Patient presenting from SNF with fever and shortness of breath.  Facility reported some altered mental status, patient was initially alert and oriented in the ED.  Patient recently admitted 1/8-1/13.  Presented at that time with altered mental status and worsening weakness.  Had recently been treated for possible UTI as she has a history of these.  Found to be COVID-19 positive and treated with supportive care.  Improved during her admission.  Admission was complicated by A-fib with RVR due to missing doses of her home Toprol , this was resumed with improvement until she became bradycardic and metoprolol  dose was reduced with return to normal rate.  Patient also appeared dehydrated and her Lasix  was changed from daily to as needed.  Additionally was treated for gout flare with 5 days of prednisone .  Patient discharged to SNF where she had been doing okay however has been developing fever, shortness of breath and reportedly some altered mental status at facility.    Patient denies chills, abdominal pain, constipation, diarrhea, nausea, vomiting.  ED Course: Vital signs in the ED notable for fever to 100.3, heart rate in the 100s-110s, blood pressure in the 100s-110s systolic, respiratory rate in the 20s, requiring 2 L to maintain saturations.  Lab workup included CMP with glucose 120, calcium  8.2, protein 6.3,  albumin  2.9.  CBC within normal limits.  PT 15.5, INR normal.  Lactic acid normal with repeat pending.  Respiratory panel for flu COVID and RSV pending.  MRSA screen pending.  Urinalysis and blood cultures pending.  Chest x-ray with central vascular structures more prominent as well as increased densities at the right perihilar region.  Could represent asymmetric congestion/edema versus developing infection.  Patient received vancomycin , cefepime , azithromycin  in the ED.  Also received 1 L IV fluids.  Review of Systems: As per HPI otherwise all other systems reviewed and are negative.  Past Medical History:  Diagnosis Date   Acute CHF (congestive heart failure) (HCC) 04/22/2020   Acute kidney failure 2016 to 2017 due to vacomycin   no kidney issues now   Anemia    Antibiotic-induced nephrotoxicity 05/26/2020   Anxiety    ARF (acute renal failure) 04/22/2020   Arthritis    Atrial fibrillation (HCC)    CHF (congestive heart failure) (HCC) 04/22/2020   CHF exacerbation (HCC) 04/23/2020   Chronic venous insufficiency 03/01/2019   Last Assessment & Plan:   Formatting of this note might be different from the original.  CVI:       Frequent leg elevation above heart level for 30 minutes each session throughout the day     Controlling swelling is lifelong goal to reduce risk of recurrent venous leg ulcers.       Avoid sodium and processed foods to reduce swelling.      Lifelong compression advised once RLE dermatitis is resolve   Controlled type 2 diabetes mellitus with hyperglycemia, without long-term current use of insulin  (HCC) 10/18/2017   Degeneration of lumbar intervertebral disc 12/02/2019   Degenerative  spondylolisthesis 12/02/2019   Depression    Diabetes mellitus without complication (HCC)    Diabetic ulcer of toe of right foot associated with type 2 diabetes mellitus, with fat layer exposed (HCC) 02/08/2019   Last Assessment & Plan:   Formatting of this note is different from the  original.  Patient tolerated selective debridement as above. Left dorsum foot ulcer making good progress toward closure.      According to recent blood flow studys patient bloodflow is adequate for healing, bilaterally.      We have reinforced purpose and importance of complete offloading and glycemic control.     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 macular degeneration of left eye 10/18/2017   Left leg cellulitis 01/18/2020   Lymphedema of both lower extremities 12/26/2014   Malignant neoplasm of overlapping sites of left female breast (HCC) 09/26/2022   OA (osteoarthritis) of hip 02/28/2018   Osteopenia 08/26/2021   Osteoporosis 09/16/2021   Other specified acquired hypothyroidism    Pain in joint of right hip 02/06/2018   Pain in left knee 11/20/2019   Permanent atrial fibrillation (HCC)    Pneumonia    Post-menopause 08/26/2021   Primary open angle glaucoma (POAG) of both eyes, indeterminate stage 10/18/2017   Restless leg    Restless leg    Restless leg syndrome 06/05/2012   Retinal artery branch occlusion of right eye 06/12/2017   Last Assessment & Plan:   Formatting of this note might be different from the original.  Relevant Hx:  Course:  Daily Update:  Today's Plan:     Right ankle pain 08/26/2021   Scoliosis deformity of spine  12/02/2019   Skin cancer    Forehead   Stage 3b chronic kidney disease (HCC) 02/23/2021   Status post amputation of lesser toe of right foot 05/13/2017   Formatting of this note might be different from the original. 05/13/17 Op note-Mrs. Wieseler is a very pleasant 89y/o female who presented to the ED with infection to her right foot. Xrays and MRI concerning for acute osteomyelitis of right 5th toe.Proceed with amputation of right 5th toe to help ensure quick resolution of infection to help prevent progression.     Stroke (HCC) 05/2017   ocular stoke on plavix  , right eye some vision loss   Vaginal vault prolapse 08/12/2014   Venous hypertension of both lower extremities 12/26/2014   Venous stasis dermatitis of left lower extremity 06/05/2018   Venous stasis ulcer (HCC)     Past Surgical History:  Procedure Laterality Date   ABDOMINAL HYSTERECTOMY  1993   Uterine Prolapse partial   BREAST BIOPSY Left 08/26/2022   MM LT BREAST BX W LOC DEV 1ST LESION IMAGE BX SPEC STEREO GUIDE 08/26/2022 [facility]   BREAST BIOPSY Left 08/26/2022   US  LT BREAST BX W LOC DEV 1ST LESION IMG BX SPEC  US  GUIDE 08/26/2022 [facility]   BREAST BIOPSY  10/25/2022   MM LT RADIOACTIVE SEED LOC MAMMO GUIDE 10/25/2022 [facility]   BREAST LUMPECTOMY WITH RADIOACTIVE SEED LOCALIZATION Left 10/27/2022   Procedure: LEFT BREAST LUMPECTOMY WITH RADIOACTIVE SEED LOCALIZATION;  Surgeon: Vernetta Berg, MD;  Location: [facility];  Service: General;  Laterality: Left;  LMA   MASTECTOMY  1974   right breast not sure if cancer 1960's   REPLACEMENT TOTAL KNEE BILATERAL Bilateral    SMALL TOE RIGHT FOOT REMOVED  04/2017   DUE TO INFECTION   Spleenectomy  2000   TOTAL HIP ARTHROPLASTY Right 02/28/2018   Procedure: RIGHT TOTAL HIP ARTHROPLASTY ANTERIOR APPROACH;  Surgeon: Melodi Lerner, MD;  Location: [facility];  Service: Orthopedics;  Laterality: Right;    TOTAL HIP ARTHROPLASTY Left 01/22/2020    Procedure: TOTAL HIP ARTHROPLASTY ANTERIOR APPROACH;  Surgeon: Melodi Lerner, MD;  Location: [facility];  Service: Orthopedics;  Laterality: Left;    TOTAL KNEE ARTHROPLASTY WITH REVISION COMPONENTS      Social History  reports that she quit smoking about 52 years ago. Her smoking use included cigarettes. She started smoking about 82 years ago. She has a 30 pack-year smoking history. She has been exposed to tobacco smoke. She has never used smokeless tobacco. She reports that she does not currently use alcohol . She reports that she does not use drugs.  Allergies[1]  Family History  Problem Relation Age of Onset   Cancer Mother    Heart disease Father    Hyperlipidemia Father    Aneurysm Sister    Kidney disease Brother   Reviewed on admission  Prior to Admission medications  Medication Sig Start Date End Date Taking? Authorizing Provider  apixaban  (ELIQUIS ) 5 MG TABS tablet Take 1/2 tablet (2.5mg ) by mouth two times daily 01/22/24  Yes Crenshaw, Redell RAMAN, MD  Cholecalciferol  (VITAMIN D3) 50 MCG (2000 UT) TABS Take 2,000 Units by mouth daily.   Yes [provider]  ferrous sulfate  325 (65 FE) MG tablet Take 325 mg by mouth See admin instructions. By mouth 3 times a week at bedtime. Give with 1000 MG Vitamin C. 07/11/17  Yes [provider]  furosemide  (LASIX ) 40 MG tablet Take 1 tablet (40 mg total) by mouth daily as needed. Patient taking differently: Take 40 mg by mouth daily as needed for fluid. 03/05/24  Yes Dahal, Chapman, MD  gabapentin  (NEURONTIN ) 100 MG capsule Take 200 mg by mouth 2 (two) times daily. 02/20/24 02/19/25 Yes [provider]  guaiFENesin -dextromethorphan  (ROBITUSSIN DM) 100-10 MG/5ML syrup Take 5 mLs by mouth every 4 (four) hours as needed for cough. 03/05/24  Yes Dahal, Chapman, MD  levothyroxine  (SYNTHROID ) 88 MCG tablet Take 1 tablet (88 mcg total) by mouth daily before breakfast. 06/26/23  Yes Almarie Birmingham B, NP  melatonin 5 MG TABS Take 1  tablet (5 mg total) by mouth at bedtime as needed. 03/05/24  Yes Dahal, Chapman, MD  metoprolol  succinate (TOPROL -XL) 25 MG 24 hr tablet Take 1 tablet (25 mg total) by mouth daily after supper. 03/05/24  Yes Dahal, Chapman, MD  omeprazole  (PRILOSEC) 40 MG capsule Take 1 capsule (40 mg total) by mouth daily. 06/26/23  Yes Almarie Birmingham NOVAK, NP  pramipexole  (MIRAPEX ) 1 MG tablet Take 1 tablet (1 mg total) by mouth every evening. 06/26/23  Yes Almarie Birmingham NOVAK, NP  pravastatin  (PRAVACHOL ) 20 MG tablet Take 1 tablet (20 mg total) by mouth daily. 12/28/23  Yes  Pietro Redell RAMAN, MD  triamcinolone  ointment (KENALOG ) 0.1 % Apply 1 application topically 2 (two) times daily as needed (skin irritation.).   Yes [provider]  vitamin C (ASCORBIC ACID ) 500 MG tablet Take 1,000 mg by mouth 3 (three) times a week. Monday , Wednesday and Friday   Yes [provider]  benzonatate  (TESSALON ) 100 MG capsule Take 1 capsule (100 mg total) by mouth 3 (three) times daily for 5 days. 03/05/24 03/10/24  Arlice Reichert, MD  clotrimazole -betamethasone  (LOTRISONE ) cream Apply 1 Application topically daily as needed or as directed Patient taking differently: Apply 1 Application topically daily as needed (rash). 05/04/22     colchicine  0.6 MG tablet Take 1 tablet (0.6 mg total) by mouth daily. 12/28/23   Pietro Redell RAMAN, MD  feeding supplement (ENSURE PLUS HIGH PROTEIN) LIQD Take 237 mLs by mouth 2 (two) times daily between meals. 03/05/24   Arlice Reichert, MD  insulin  aspart (NOVOLOG ) 100 UNIT/ML injection Inject 0-9 Units into the skin 3 (three) times daily with meals. 03/05/24   Arlice Reichert, MD  nystatin  cream (MYCOSTATIN ) Apply 1 Application topically 2 (two) times daily. Patient not taking: Reported on 02/29/2024 06/26/23   Almarie Waddell NOVAK, NP  predniSONE  (DELTASONE ) 20 MG tablet Take 1 tablet (20 mg total) by mouth daily with breakfast for 2 days. 03/06/24 03/08/24  Arlice Reichert, MD    Physical Exam: Vitals:   03/08/24  1426 03/08/24 1433 03/08/24 1450 03/08/24 1553  BP:  113/78 100/66   Pulse:  (!) 112 (!) 101   Resp:  20 20 (!) 22  Temp:  100.3 F (37.9 C)    TempSrc:  Oral    SpO2: 98% 97% 97%     Physical Exam Constitutional:      General: She is not in acute distress.    Appearance: Normal appearance.  HENT:     Head: Normocephalic and atraumatic.     Mouth/Throat:     Mouth: Mucous membranes are moist.     Pharynx: Oropharynx is clear.  Eyes:     Extraocular Movements: Extraocular movements intact.     Pupils: Pupils are equal, round, and reactive to light.  Cardiovascular:     Rate and Rhythm: Tachycardia present. Rhythm irregular.     Pulses: Normal pulses.     Heart sounds: Normal heart sounds.  Pulmonary:     Effort: No respiratory distress.     Breath sounds: Decreased breath sounds present.     Comments: Mild increased work of breathing Abdominal:     General: Bowel sounds are normal. There is no distension.     Palpations: Abdomen is soft.     Tenderness: There is no abdominal tenderness.  Musculoskeletal:        General: No swelling or deformity.  Skin:    General: Skin is warm and dry.  Neurological:     General: No focal deficit present.     Mental Status: Mental status is at baseline.    Labs on Admission: I have personally reviewed following labs and imaging studies  CBC: Recent Labs  Lab 03/03/24 1603 03/08/24 1418  WBC 8.0 7.5  NEUTROABS 5.2 5.1  HGB 14.1 12.7  HCT 41.6 38.7  MCV 94.1 96.5  PLT 344 257    Basic Metabolic Panel: Recent Labs  Lab 03/03/24 1603 03/08/24 1418  NA 132* 139  K 3.9 3.6  CL 98 104  CO2 22 25  GLUCOSE 96 120*  BUN 17 18  CREATININE 0.91 0.82  CALCIUM  8.4* 8.2*    GFR: Estimated Creatinine Clearance: 35.5 mL/min (by C-G formula based on SCr of 0.82 mg/dL).  Liver Function Tests: Recent Labs  Lab 03/08/24 1418  AST 37  ALT 38  ALKPHOS 75  BILITOT 0.5  PROT 6.3*  ALBUMIN  2.9*    Urine analysis:     Component Value Date/Time   COLORURINE YELLOW 02/29/2024 2023   APPEARANCEUR HAZY (A) 02/29/2024 2023   LABSPEC 1.016 02/29/2024 2023   PHURINE 5.0 02/29/2024 2023   GLUCOSEU NEGATIVE 02/29/2024 2023   HGBUR NEGATIVE 02/29/2024 2023   BILIRUBINUR NEGATIVE 02/29/2024 2023   BILIRUBINUR negative 12/09/2020 1509   KETONESUR NEGATIVE 02/29/2024 2023   PROTEINUR NEGATIVE 02/29/2024 2023   UROBILINOGEN 0.2 12/09/2020 1509   NITRITE NEGATIVE 02/29/2024 2023   LEUKOCYTESUR NEGATIVE 02/29/2024 2023    Radiological Exams on Admission: DG Chest Port 1 View Result Date: 03/08/2024 CLINICAL DATA:  Questionable sepsis.  Evaluate for abnormality. EXAM: PORTABLE CHEST 1 VIEW COMPARISON:  02/29/2024 FINDINGS: Low lung volumes but concern for increased densities in the right perihilar region. Slightly enlarged central vascular structures. Heart size is grossly stable. Negative for a pneumothorax. No acute bone abnormality. IMPRESSION: 1. Enlarged central vascular structures and increased densities in the right perihilar region. Differential diagnosis includes asymmetric vascular congestion/mild edema versus developing infection. Recommend follow-up. Electronically Signed   By: Juliene Balder M.D.   On: 03/08/2024 15:42   EKG: Independently reviewed.  Atrial fibrillation at 108 bpm.  Nonspecific T wave changes.  PVC noted.  Assessment/Plan Principal Problem:   Acute respiratory failure with hypoxia (HCC) Active Problems:   Restless leg syndrome   Essential hypertension, benign   GERD (gastroesophageal reflux disease)   Hypothyroidism   Hyperlipidemia   CHF (congestive heart failure) (HCC)   History of CVA (cerebrovascular accident)   Glaucoma   Permanent atrial fibrillation (HCC)   Controlled type 2 diabetes mellitus with hyperglycemia, without long-term current use of insulin  (HCC)   Stage 3b chronic kidney disease (HCC)   Malignant neoplasm of overlapping sites of left female breast (HCC)    COVID-19   Gout   Acute respiratory failure with hypoxia Recent COVID-19 infection ?Pneumonia Rule out CHF exacerbation History of CHF > Patient presenting with fever and shortness of breath from facility.  Has minimal oxygen requirement of 2 L in the ED.  Not previously on oxygen. > Recently admitted 1/8-1/13 treated for COVID-19 infection, A-fib with RVR, dehydration, gout flare. > Given patient's reported fevers and temperature of 100.3 in the ED there is concern for possible postviral pneumonia versus healthcare associated pneumonia.  However, there is no leukocytosis, lactic acid normal.  Will check procalcitonin to further evaluate this.  Anticipate COVID will still be positive. > Could be a volume overload component.  Patient does have history of CHF with last echo in 2024 showing EF 50-6%, indeterminate diastolic function and normal RV function.  Her Lasix  was changed from daily to as needed during last admission due to concern for dehydration. > Received broad-spectrum antibiotic coverage in the ED with vancomycin , cefepime , azithromycin .  Also received 1 L IV fluid to cover for possible developing sepsis but to avoid excessive additional volume. > During last admission did have mildly elevated D-dimer but as she is chronic on Eliquis  workup for PE not pursued at that time. - Monitor on progressive unit overnight - Continue supplemental oxygen, wean as tolerated - Follow-up urinalysis and margin screen as well as blood  culture ?Pneumonia - Will continue with ceftriaxone  and azithromycin  while undergoing workup. - Trend fever curve and WBC - Follow-up RVP - Check procalcitonin to further delineate between bacterial and viral etiology History of CHF, Rule out Exacerbation - Check proBNP to further evaluate for CHF component - Will hold off on further fluids or Lasix  pending results of these labs - Strict intake and outtake, daily weights - Check magnesium  - Trend renal function and  electrolytes  Hypertension - Blood pressure currently low normal - Will continue with metoprolol  as patient went into RVR the last time this was held - Hold off on diuresis for now  Hyperlipidemia - Continue home pravastatin   Diabetes - SSI  GERD - Continue home PPI  Atrial fibrillation - Continue home metoprolol  and Eliquis  Addendum > Has developed RVR to the 140s.  P.o. dose of metoprolol  given.  Will give a dose IV and if you patient does not respond to this we will likely start amiodarone infusion given with normal blood pressures in the ED. - Metoprolol  2.5 mg IV x 1 - If rate fails to improve with metoprolol , will start amiodarone infusion  History of CVA - Continue home statin, Eliquis   Gout > Treated for flare with prednisone  during last admission - Continue daily colchicine   Anemia - Hemoglobin normal  History of breast cancer > Status post lobectomy - Continue outpatient follow-up  Venous insufficiency Venous insufficiency ulcer - Noted  RLS - Continue home gabapentin  and pramipexole   DVT prophylaxis: Eliquis  Code Status:   DNR/DNI Family Communication:  Updated at bedside  Disposition Plan:   Patient is from:  SNF  Anticipated DC to:  Same as above  Anticipated DC date:  2 to 4 days  Anticipated DC barriers: None  Consults called:  None Admission status:  Inpatient, progressive  Severity of Illness: The appropriate patient status for this patient is INPATIENT. Inpatient status is judged to be reasonable and necessary in order to provide the required intensity of service to ensure the patient's safety. The patient's presenting symptoms, physical exam findings, and initial radiographic and laboratory data in the context of their chronic comorbidities is felt to place them at high risk for further clinical deterioration. Furthermore, it is not anticipated that the patient will be medically stable for discharge from the hospital within 2 midnights of  admission.   * I certify that at the point of admission it is my clinical judgment that the patient will require inpatient hospital care spanning beyond 2 midnights from the point of admission due to high intensity of service, high risk for further deterioration and high frequency of surveillance required.Dana Bradford Marsa KATHEE Seena MD [facility]  How to contact the [facility] Attending or Consulting provider 7A - 7P or covering provider during after hours 7P -7A, for this patient?   Check the care team in [facility] and look for a) attending/consulting [facility] provider listed and b) the [facility] team listed Log into [email or web address] and use [facility] universal password to access. If you do not have the password, please contact the hospital operator. Locate the [facility] provider you are looking for under [facility] and page to a number that you can be directly reached. If you still have difficulty reaching the provider, please page the [facility] (Director on Call) for the Hospitalists listed on amion for assistance.  03/08/2024, 5:12 PM       [1]  Allergies Allergen Reactions   Arthrotec [Diclofenac -Misoprostol] Shortness Of Breath and Diarrhea  Doxycycline Nausea And Vomiting and Rash    Significant enough to d/c treatment and refuse to take more Vomiting (intolerance) The first time she took it she had N/V and the second she had a rash.      Latex Rash and Other (See Comments)   Amlodipine Swelling    angioedema   Celebrex [Celecoxib] Other (See Comments)    GI Bleeding   Vancomycin  Other (See Comments)    Acute kidney failure   Diclofenac  Diarrhea   Shellfish Allergy Itching and Rash   "

## 2024-03-08 NOTE — ED Triage Notes (Signed)
 Pt bib [facility] coming from [facility] with CC of fever and AMS. Pt recently tested positive for covid. Denies pain. Pt alert and oriented x4 baseline but is alert and oriented x2 during triage. Pt found to be in afib RVR with EMS.   EMS VS: 137/77 80-140 pulse 20 RRR 98% 2L [place] Etco2 25 Cbg 158

## 2024-03-08 NOTE — Sepsis Progress Note (Signed)
 Sepsis protocol monitored by eLink

## 2024-03-09 ENCOUNTER — Encounter (HOSPITAL_COMMUNITY): Payer: Self-pay | Admitting: Internal Medicine

## 2024-03-09 DIAGNOSIS — J9601 Acute respiratory failure with hypoxia: Secondary | ICD-10-CM

## 2024-03-09 LAB — COMPREHENSIVE METABOLIC PANEL WITH GFR
ALT: 36 U/L (ref 0–44)
AST: 34 U/L (ref 15–41)
Albumin: 2.8 g/dL — ABNORMAL LOW (ref 3.5–5.0)
Alkaline Phosphatase: 72 U/L (ref 38–126)
Anion gap: 12 (ref 5–15)
BUN: 17 mg/dL (ref 8–23)
CO2: 22 mmol/L (ref 22–32)
Calcium: 8 mg/dL — ABNORMAL LOW (ref 8.9–10.3)
Chloride: 103 mmol/L (ref 98–111)
Creatinine, Ser: 0.77 mg/dL (ref 0.44–1.00)
GFR, Estimated: >60 mL/min
Glucose, Bld: 91 mg/dL (ref 70–99)
Potassium: 3.4 mmol/L — ABNORMAL LOW (ref 3.5–5.1)
Sodium: 136 mmol/L (ref 135–145)
Total Bilirubin: 0.3 mg/dL (ref 0.0–1.2)
Total Protein: 6 g/dL — ABNORMAL LOW (ref 6.5–8.1)

## 2024-03-09 LAB — URINALYSIS, ROUTINE W REFLEX MICROSCOPIC
Bilirubin Urine: NEGATIVE
Glucose, UA: NEGATIVE mg/dL
Ketones, ur: 5 mg/dL — AB
Nitrite: POSITIVE — AB
Protein, ur: 30 mg/dL — AB
Specific Gravity, Urine: 1.013 (ref 1.005–1.030)
WBC, UA: >50 WBC/hpf (ref 0–5)
pH: 6 (ref 5.0–8.0)

## 2024-03-09 LAB — CBC
HCT: 40.6 % (ref 36.0–46.0)
Hemoglobin: 13.4 g/dL (ref 12.0–15.0)
MCH: 31.4 pg (ref 26.0–34.0)
MCHC: 33 g/dL (ref 30.0–36.0)
MCV: 95.1 fL (ref 80.0–100.0)
Platelets: 242 K/uL (ref 150–400)
RBC: 4.27 MIL/uL (ref 3.87–5.11)
RDW: 13.5 % (ref 11.5–15.5)
WBC: 10 K/uL (ref 4.0–10.5)
nRBC: 0 % (ref 0.0–0.2)

## 2024-03-09 LAB — GLUCOSE, CAPILLARY
Glucose-Capillary: 84 mg/dL (ref 70–99)
Glucose-Capillary: 129 mg/dL — ABNORMAL HIGH (ref 70–99)
Glucose-Capillary: 130 mg/dL — ABNORMAL HIGH (ref 70–99)
Glucose-Capillary: 112 mg/dL — ABNORMAL HIGH (ref 70–99)

## 2024-03-09 LAB — MRSA NEXT GEN BY PCR, NASAL: MRSA by PCR Next Gen: NOT DETECTED

## 2024-03-09 MED ORDER — DEXAMETHASONE 4 MG PO TABS
6.0000 mg | ORAL_TABLET | ORAL | Status: DC
  Administered 2024-03-09 – 2024-03-15 (×7): 6 mg via ORAL
  Filled 2024-03-09 (×3): qty 1
  Filled 2024-03-09 (×2): qty 2
  Filled 2024-03-09: qty 1
  Filled 2024-03-09: qty 2

## 2024-03-09 MED ORDER — SODIUM CHLORIDE 0.9 % IV SOLN
100.0000 mg | Freq: Every day | INTRAVENOUS | Status: AC
  Administered 2024-03-10 – 2024-03-11 (×2): 100 mg via INTRAVENOUS
  Filled 2024-03-09 (×2): qty 20

## 2024-03-09 MED ORDER — SODIUM CHLORIDE 0.9 % IV SOLN
200.0000 mg | Freq: Once | INTRAVENOUS | Status: AC
  Administered 2024-03-09: 200 mg via INTRAVENOUS
  Filled 2024-03-09: qty 40

## 2024-03-09 NOTE — Plan of Care (Signed)
" °  Problem: Metabolic: Goal: Ability to maintain appropriate glucose levels will improve Outcome: Progressing   Problem: Nutritional: Goal: Maintenance of adequate nutrition will improve Outcome: Progressing Goal: Progress toward achieving an optimal weight will improve Outcome: Progressing   Problem: Skin Integrity: Goal: Risk for impaired skin integrity will decrease Outcome: Progressing   Problem: Education: Goal: Knowledge of General Education information will improve Description: Including pain rating scale, medication(s)/side effects and non-pharmacologic comfort measures Outcome: Progressing   Problem: Clinical Measurements: Goal: Will remain free from infection Outcome: Progressing   "

## 2024-03-09 NOTE — Progress Notes (Addendum)
 " Progress Note   Patient: Dana Bradford FMW:[id number] DOB: Apr 04, 1930 DOA: 03/08/2024     1 DOS: the patient was seen and examined on 03/09/2024    Brief hospital course:  Dana Bradford is a 89 y.o. female with medical history significant of hypertension, hyperlipidemia, diabetes, GERD, atrial fibrillation, hypothyroidism, CKD 3B, CVA, chronic CHF, macular degeneration, glaucoma, gout, anemia, breast cancer status postlumpectomy, venous insufficiency, venous insufficiency ulcer, RLS, recurrent UTI presenting with fever, shortness of breath, altered mental status.  Patient had recently tested positive for COVID-19 (admitted 1/8 to 1/13) and had been discharged to a skilled nursing facility.  Patient developed fever, shortness of breath at the facility and presented again to hospital.  Diagnosed with pneumonia and started on antibiotics.    Assessment and Plan:  Acute respiratory failure with hypoxia Likely due to pneumonia  No baseline oxygen use.  - Continue supplemental oxygen.   Pneumonia.  COVID-19 infection.  Presented with fever, shortness of breath. Patient had tested positive for COVID-19 on 02/29/2024.  Admitted here from 1/8 to 1/13.  She was not hypoxic and did not receive antivirals, steroids or antibiotics during that admission. Patient now presenting with hypoxia 8 days after initial COVID-19 diagnosis. Procal is not elevated.  Patient may have a viral pneumonia from COVID-19 or a bacterial pneumonia or a combination. . -Given hypoxia, will start patient on remdesivir  and dexamethasone .  - As bacterial pneumonia cannot be ruled out, will continue antibiotics.   CHF (HFpEF) Last TTE with EF 55-60% Patient presented with elevated proBNP but no signs of volume overload. - Will assess volume status daily and diurese as needed.  Hypertension - Blood pressure currently low normal - Will continue with metoprolol  as patient went into RVR the last time this was held - Hold off on  diuresis for now   Hyperlipidemia - Continue home pravastatin    Diabetes - SSI   GERD - Continue home PPI   Atrial fibrillation - Continue home metoprolol  and Eliquis  - Can consider IV metoprolol  2.5 mg if patient goes into RVR.    History of CVA - Continue home statin, Eliquis    Gout > Treated for flare with prednisone  during last admission - Continue daily colchicine    Anemia - Hemoglobin normal   History of breast cancer > Status post lobectomy - Continue outpatient follow-up   Venous insufficiency Venous insufficiency ulcer - Noted   RLS - Continue home gabapentin  and pramipexole        Subjective: Patient says she still feels unwell.   Physical Exam: BP 125/83 (BP Location: Left Arm)   Pulse 98   Temp 98.9 F (37.2 C) (Oral)   Resp 20   Ht 4' 10 (1.473 m)   Wt 63.4 kg   SpO2 96%   BMI 29.21 kg/m    General: Alert, oriented X3. Appears pale and weak. Eyes: Pupils equal, reactive  Oral cavity: moist mucous membranes  Head: Atraumatic, normocephalic  Neck: supple  Chest: clear to auscultation. No crackles, no wheezes  CVS: S1,S2 RRR. No murmurs  Abd: No distention, soft, non-tender. No masses palpable  Extr: No edema   MSK: No joint deformities or swelling  Neurological: Grossly intact.    Data Reviewed:    Latest Ref Rng & Units 03/09/2024    3:15 AM 03/08/2024    2:18 PM 03/03/2024    4:03 PM  CBC  WBC 4.0 - 10.5 K/uL 10.0  7.5  8.0   Hemoglobin 12.0 - 15.0 g/dL 13.4  12.7  14.1   Hematocrit 36.0 - 46.0 % 40.6  38.7  41.6   Platelets 150 - 400 K/uL 242  257  344       Latest Ref Rng & Units 03/09/2024    3:15 AM 03/08/2024    2:18 PM 03/03/2024    4:03 PM  BMP  Glucose 70 - 99 mg/dL 91  879  96   BUN 8 - 23 mg/dL 17  18  17    Creatinine 0.44 - 1.00 mg/dL 9.22  9.17  9.08   Sodium 135 - 145 mmol/L 136  139  132   Potassium 3.5 - 5.1 mmol/L 3.4  3.6  3.9   Chloride 98 - 111 mmol/L 103  104  98   CO2 22 - 32 mmol/L 22  25  22     Calcium  8.9 - 10.3 mg/dL 8.0  8.2  8.4     Family Communication: n/a  Disposition: Status is: Inpatient Remains inpatient appropriate because: On IV antibiotics and antivirals, on supplemental oxygen due to new hypoxia.  DVT PPx: Systemic anticoagulation with apixaban .      Author: MDALA-GAUSI, Charlotte Brafford AGATHA, MD 03/09/2024 2:54 PM  For on call review [email or web address].    "

## 2024-03-10 DIAGNOSIS — J9601 Acute respiratory failure with hypoxia: Secondary | ICD-10-CM | POA: Diagnosis not present

## 2024-03-10 LAB — CBC
HCT: 39.6 % (ref 36.0–46.0)
Hemoglobin: 13.2 g/dL (ref 12.0–15.0)
MCH: 31.4 pg (ref 26.0–34.0)
MCHC: 33.3 g/dL (ref 30.0–36.0)
MCV: 94.1 fL (ref 80.0–100.0)
Platelets: 278 K/uL (ref 150–400)
RBC: 4.21 MIL/uL (ref 3.87–5.11)
RDW: 13.5 % (ref 11.5–15.5)
WBC: 6.6 K/uL (ref 4.0–10.5)
nRBC: 0 % (ref 0.0–0.2)

## 2024-03-10 LAB — COMPREHENSIVE METABOLIC PANEL WITH GFR
ALT: 37 U/L (ref 0–44)
AST: 36 U/L (ref 15–41)
Albumin: 2.8 g/dL — ABNORMAL LOW (ref 3.5–5.0)
Alkaline Phosphatase: 74 U/L (ref 38–126)
Anion gap: 12 (ref 5–15)
BUN: 16 mg/dL (ref 8–23)
CO2: 24 mmol/L (ref 22–32)
Calcium: 8.5 mg/dL — ABNORMAL LOW (ref 8.9–10.3)
Chloride: 103 mmol/L (ref 98–111)
Creatinine, Ser: 0.68 mg/dL (ref 0.44–1.00)
GFR, Estimated: >60 mL/min
Glucose, Bld: 149 mg/dL — ABNORMAL HIGH (ref 70–99)
Potassium: 3.4 mmol/L — ABNORMAL LOW (ref 3.5–5.1)
Sodium: 139 mmol/L (ref 135–145)
Total Bilirubin: <0.2 mg/dL (ref 0.0–1.2)
Total Protein: 6.3 g/dL — ABNORMAL LOW (ref 6.5–8.1)

## 2024-03-10 LAB — GLUCOSE, CAPILLARY
Glucose-Capillary: 140 mg/dL — ABNORMAL HIGH (ref 70–99)
Glucose-Capillary: 117 mg/dL — ABNORMAL HIGH (ref 70–99)
Glucose-Capillary: 115 mg/dL — ABNORMAL HIGH (ref 70–99)
Glucose-Capillary: 146 mg/dL — ABNORMAL HIGH (ref 70–99)
Glucose-Capillary: 164 mg/dL — ABNORMAL HIGH (ref 70–99)

## 2024-03-10 LAB — MRSA CULTURE: Culture: NOT DETECTED

## 2024-03-10 MED ORDER — METOPROLOL TARTRATE 25 MG PO TABS
25.0000 mg | ORAL_TABLET | Freq: Once | ORAL | Status: AC
  Administered 2024-03-10: 25 mg via ORAL
  Filled 2024-03-10: qty 1

## 2024-03-10 MED ORDER — LACTATED RINGERS IV BOLUS
500.0000 mL | Freq: Once | INTRAVENOUS | Status: AC
  Administered 2024-03-10: 500 mL via INTRAVENOUS

## 2024-03-10 MED ORDER — METOPROLOL TARTRATE 5 MG/5ML IV SOLN
5.0000 mg | Freq: Once | INTRAVENOUS | Status: AC
  Administered 2024-03-10: 5 mg via INTRAVENOUS
  Filled 2024-03-10: qty 5

## 2024-03-10 MED ORDER — POTASSIUM CHLORIDE 20 MEQ PO PACK
40.0000 meq | PACK | Freq: Once | ORAL | Status: AC
  Administered 2024-03-10: 40 meq via ORAL
  Filled 2024-03-10: qty 2

## 2024-03-10 NOTE — Progress Notes (Addendum)
 Patient HR is been between 120s-140s (Afib RVR), she is resting comfortably in the bed, denies any chest pain, dizziness, SOB, blood pressure was stable, notified to the attending MD, metoprolol  5mg  IV administered at 1409, POC continues.

## 2024-03-10 NOTE — Plan of Care (Signed)
" °  Problem: Coping: Goal: Ability to adjust to condition or change in health will improve Outcome: Progressing   Problem: Metabolic: Goal: Ability to maintain appropriate glucose levels will improve Outcome: Progressing   Problem: Nutritional: Goal: Maintenance of adequate nutrition will improve Outcome: Progressing Goal: Progress toward achieving an optimal weight will improve Outcome: Progressing   Problem: Tissue Perfusion: Goal: Adequacy of tissue perfusion will improve Outcome: Progressing   Problem: Skin Integrity: Goal: Risk for impaired skin integrity will decrease Outcome: Progressing   "

## 2024-03-10 NOTE — Progress Notes (Addendum)
 " Progress Note   Patient: Dana Bradford FMW:[id number] DOB: [birth year] DOA: 03/08/2024     2 DOS: the patient was seen and examined on 03/10/2024    Brief hospital course:  [name] is a 89 y.o. female with medical history significant of hypertension, hyperlipidemia, diabetes, GERD, atrial fibrillation, hypothyroidism, CKD 3B, CVA, chronic CHF, macular degeneration, glaucoma, gout, anemia, breast cancer status postlumpectomy, venous insufficiency, venous insufficiency ulcer, RLS, recurrent UTI presenting with fever, shortness of breath, altered mental status.  Patient had recently tested positive for COVID-19 (admitted 1/8 to 1/13) and had been discharged to a skilled nursing facility.  Patient developed fever, shortness of breath at the facility and presented again to hospital.  Diagnosed with pneumonia and started on antibiotics.    Assessment and Plan:  Acute respiratory failure with hypoxia Likely due to pneumonia  No baseline oxygen use.  - Continue supplemental oxygen.   Pneumonia.  COVID-19 infection.  Presented with fever, shortness of breath. Patient had tested positive for COVID-19 on 02/29/2024.  Admitted here from 1/8 to 1/13.  She was not hypoxic and did not receive antivirals, steroids or antibiotics during that admission. Patient now presenting with hypoxia 8 days after initial COVID-19 diagnosis. Procal is not elevated.  Patient may have a viral pneumonia from COVID-19 or a bacterial pneumonia or a combination. . -Given hypoxia, patient was started on remdesivir  and dexamethasone .  - As bacterial pneumonia cannot be ruled out, continue antibiotics.   CHF (HFpEF) Last TTE with EF 55-60% Patient presented with elevated proBNP but no signs of volume overload. - Will assess volume status daily and diurese as needed.  Hypertension - Blood pressure currently low normal - Will continue with metoprolol  as patient went into RVR the last time this was held - Hold off on  diuresis for now   Hyperlipidemia - Continue home pravastatin    Diabetes - SSI   GERD - Continue home PPI   Atrial fibrillation - Continue home metoprolol  and Eliquis  - Can consider IV metoprolol  2.5 mg if patient goes into RVR.    History of CVA - Continue home statin, Eliquis    Gout > Treated for flare with prednisone  during last admission - Continue daily colchicine    Anemia - Hemoglobin normal   History of breast cancer > Status post lobectomy - Continue outpatient follow-up   Venous insufficiency Venous insufficiency ulcer - Noted   RLS - Continue home gabapentin  and pramipexole        Subjective: Patient says she still feels unwell.  Family concerned that patient is eating poorly.  Physical Exam: BP 114/72 (BP Location: Left Arm)   Pulse (!) 107   Temp 97.7 F (36.5 C) (Oral)   Resp 19   Ht 4' 10 (1.473 m)   Wt 63.4 kg   SpO2 93%   BMI 29.21 kg/m    General: Alert, oriented X3.  Appears stronger today. Eyes: Pupils equal, reactive  Oral cavity: moist mucous membranes  Head: Atraumatic, normocephalic  Neck: supple  Chest: Diminished breath sounds CVS: S1,S2 RRR. No murmurs  Abd: No distention, soft, non-tender. No masses palpable  Extr: No edema   MSK: No joint deformities or swelling  Neurological: Grossly intact.    Data Reviewed:    Latest Ref Rng & Units 03/10/2024    2:50 AM 03/09/2024    3:15 AM 03/08/2024    2:18 PM  CBC  WBC 4.0 - 10.5 K/uL 6.6  10.0  7.5   Hemoglobin 12.0 - 15.0 g/dL  13.2  13.4  12.7   Hematocrit 36.0 - 46.0 % 39.6  40.6  38.7   Platelets 150 - 400 K/uL 278  242  257       Latest Ref Rng & Units 03/10/2024    2:50 AM 03/09/2024    3:15 AM 03/08/2024    2:18 PM  BMP  Glucose 70 - 99 mg/dL 850  91  879   BUN 8 - 23 mg/dL 16  17  18    Creatinine 0.44 - 1.00 mg/dL 9.31  9.22  9.17   Sodium 135 - 145 mmol/L 139  136  139   Potassium 3.5 - 5.1 mmol/L 3.4  3.4  3.6   Chloride 98 - 111 mmol/L 103  103  104    CO2 22 - 32 mmol/L 24  22  25    Calcium  8.9 - 10.3 mg/dL 8.5  8.0  8.2     Family Communication: Spoke with patient's daughter on the phone  Disposition: Status is: Inpatient Remains inpatient appropriate because: On IV antibiotics and antivirals, on supplemental oxygen due to new hypoxia.  DVT PPx: Systemic anticoagulation with apixaban .      Author: MDALA-GAUSI, Jader Desai AGATHA, MD 03/10/2024 1:32 PM  For on call review [email or web address]    "

## 2024-03-11 ENCOUNTER — Other Ambulatory Visit: Payer: Self-pay | Admitting: Family Medicine

## 2024-03-11 DIAGNOSIS — J9601 Acute respiratory failure with hypoxia: Secondary | ICD-10-CM | POA: Diagnosis not present

## 2024-03-11 DIAGNOSIS — K219 Gastro-esophageal reflux disease without esophagitis: Secondary | ICD-10-CM

## 2024-03-11 LAB — CBC
HCT: 37.6 % (ref 36.0–46.0)
Hemoglobin: 12.8 g/dL (ref 12.0–15.0)
MCH: 31.4 pg (ref 26.0–34.0)
MCHC: 34 g/dL (ref 30.0–36.0)
MCV: 92.2 fL (ref 80.0–100.0)
Platelets: 302 K/uL (ref 150–400)
RBC: 4.08 MIL/uL (ref 3.87–5.11)
RDW: 13.2 % (ref 11.5–15.5)
WBC: 5.4 K/uL (ref 4.0–10.5)
nRBC: 0 % (ref 0.0–0.2)

## 2024-03-11 LAB — COMPREHENSIVE METABOLIC PANEL WITH GFR
ALT: 31 U/L (ref 0–44)
AST: 25 U/L (ref 15–41)
Albumin: 2.7 g/dL — ABNORMAL LOW (ref 3.5–5.0)
Alkaline Phosphatase: 71 U/L (ref 38–126)
Anion gap: 12 (ref 5–15)
BUN: 22 mg/dL (ref 8–23)
CO2: 23 mmol/L (ref 22–32)
Calcium: 8.4 mg/dL — ABNORMAL LOW (ref 8.9–10.3)
Chloride: 101 mmol/L (ref 98–111)
Creatinine, Ser: 0.59 mg/dL (ref 0.44–1.00)
GFR, Estimated: >60 mL/min
Glucose, Bld: 193 mg/dL — ABNORMAL HIGH (ref 70–99)
Potassium: 3.6 mmol/L (ref 3.5–5.1)
Sodium: 136 mmol/L (ref 135–145)
Total Bilirubin: <0.2 mg/dL (ref 0.0–1.2)
Total Protein: 6.1 g/dL — ABNORMAL LOW (ref 6.5–8.1)

## 2024-03-11 LAB — GLUCOSE, CAPILLARY
Glucose-Capillary: 169 mg/dL — ABNORMAL HIGH (ref 70–99)
Glucose-Capillary: 129 mg/dL — ABNORMAL HIGH (ref 70–99)
Glucose-Capillary: 125 mg/dL — ABNORMAL HIGH (ref 70–99)
Glucose-Capillary: 161 mg/dL — ABNORMAL HIGH (ref 70–99)
Glucose-Capillary: 186 mg/dL — ABNORMAL HIGH (ref 70–99)

## 2024-03-11 NOTE — Progress Notes (Signed)
 " PROGRESS NOTE    Dana Bradford  FMW:[id number] DOB: December 23, 1930 DOA: 03/08/2024 PCP: Almarie Waddell NOVAK, NP   Brief Narrative:  This 89 yrs old female with medical history significant of hypertension, hyperlipidemia, diabetes, GERD, atrial fibrillation, hypothyroidism, CKD 3B, CVA, chronic CHF, macular degeneration, glaucoma, gout, anemia, breast cancer status postlumpectomy, venous insufficiency, venous insufficiency ulcer, RLS, recurrent UTI presenting with fever, shortness of breath, altered mental status.  Patient had recently tested positive for COVID-19 (admitted 1/8 to 1/13) and had been discharged to a skilled nursing facility.  Patient developed fever, shortness of breath at the facility and presented again to hospital.  Diagnosed with pneumonia and started on antibiotics.    Assessment & Plan:   Principal Problem:   Acute respiratory failure with hypoxia (HCC) Active Problems:   Restless leg syndrome   Essential hypertension, benign   GERD (gastroesophageal reflux disease)   Hypothyroidism   Hyperlipidemia   CHF (congestive heart failure) (HCC)   History of CVA (cerebrovascular accident)   Glaucoma   Permanent atrial fibrillation (HCC)   Controlled type 2 diabetes mellitus with hyperglycemia, without long-term current use of insulin  (HCC)   Stage 3b chronic kidney disease (HCC)   Malignant neoplasm of overlapping sites of left female breast (HCC)   COVID-19   Gout  Acute hypoxic respiratory failure: Likely due to pneumonia . No baseline oxygen use.  Continue supplemental oxygen.    Pneumonia.  COVID-19 infection.  She presented with fever, shortness of breath. Patient had tested positive for COVID-19 on 02/29/2024.  Admitted here from 1/8 to 1/13.  She was not hypoxic and did not receive antivirals, steroids or antibiotics during that admission. Patient now presenting with hypoxia 8 days after initial COVID-19 diagnosis. Pro-calcitonin is not elevated.  Patient may have a  viral pneumonia from COVID-19 or a bacterial pneumonia or a combination. . Given hypoxia, patient was started on remdesivir  and dexamethasone .  As bacterial pneumonia cannot be ruled out, continue antibiotics.    CHF (HFpEF): Last TTE with EF 55-60% Patient presented with elevated proBNP but no signs of volume overload. Will assess volume status daily and diurese as needed.   Hypertension: Continue with metoprolol  as patient went into RVR the last time this was held. Hold off on diuresis for now   Hyperlipidemia: Continue home pravastatin .   Diabetes Mellitus II: Continue SSI.   GERD: Continue home PPI.   Atrial fibrillation: Continue home metoprolol  and Eliquis  Can consider IV metoprolol  2.5 mg if patient goes into RVR.    History of CVA: Continue home statin, Eliquis .   Gout: Treated for flare with prednisone  during last admission. Continue daily colchicine .   Anemia: Hemoglobin normal   History of breast cancer: Status post lobectomy Continue outpatient follow-up   Venous insufficiency: Venous insufficiency ulcer: Noted.   RLS: Continue home gabapentin  and pramipexole .     DVT prophylaxis:Eliquis   Code Status: Full code Family Communication: No family at bed side Disposition Plan:    Status is: Inpatient Remains inpatient appropriate because: Patient admitted for pneumonia in the setting of COVID.  Started on IV antibiotics and IV antivirals.  Patient is still remains on supplemental oxygen.  Patient is not medically ready for discharge yet.   Consultants:  None  Procedures: None  Antimicrobials: Anti-infectives (From admission, onward)    Start     Dose/Rate Route Frequency Ordered Stop   03/10/24 1000  remdesivir  100 mg in sodium chloride  0.9 % 100 mL IVPB  Placed in Followed by Linked Group   100 mg 200 mL/hr over 30 Minutes Intravenous Daily 03/09/24 1516 03/11/24 1008   03/09/24 1615  remdesivir  200 mg in sodium chloride  0.9% 250  mL IVPB       Placed in Followed by Linked Group   200 mg 580 mL/hr over 30 Minutes Intravenous Once 03/09/24 1516 03/09/24 1728   03/09/24 1500  azithromycin  (ZITHROMAX ) tablet 500 mg        500 mg Oral Daily 03/08/24 1826     03/08/24 2200  cefTRIAXone  (ROCEPHIN ) 2 g in sodium chloride  0.9 % 100 mL IVPB        2 g 200 mL/hr over 30 Minutes Intravenous Every 24 hours 03/08/24 1826     03/08/24 1545  ceFEPIme  (MAXIPIME ) 2 g in sodium chloride  0.9 % 100 mL IVPB        2 g 200 mL/hr over 30 Minutes Intravenous  Once 03/08/24 1536 03/08/24 1650   03/08/24 1545  vancomycin  (VANCOREADY) IVPB 1500 mg/300 mL        1,500 mg 150 mL/hr over 120 Minutes Intravenous  Once 03/08/24 1536 03/08/24 2025   03/08/24 1515  cefTRIAXone  (ROCEPHIN ) 1 g in sodium chloride  0.9 % 100 mL IVPB  Status:  Discontinued        1 g 200 mL/hr over 30 Minutes Intravenous  Once 03/08/24 1508 03/08/24 1524   03/08/24 1515  azithromycin  (ZITHROMAX ) tablet 500 mg        500 mg Oral  Once 03/08/24 1508 03/08/24 1621       Subjective: Patient was seen and examined at bedside. Overnight events noted. Patient seems much improved.  She denies any concerns,  reports she is feeling much improved.  Objective: Vitals:   03/11/24 0013 03/11/24 0448 03/11/24 0830 03/11/24 1100  BP: 136/87 (!) 135/97 (!) 148/97 (!) 135/101  Pulse: 87 75 61 99  Resp: 20 20 20 16   Temp: 97.6 F (36.4 C) (!) 97.3 F (36.3 C) 97.7 F (36.5 C) 97.6 F (36.4 C)  TempSrc: Oral  Oral Oral  SpO2: 92% 95% 96% 93%  Weight:      Height:        Intake/Output Summary (Last 24 hours) at 03/11/2024 1159 Last data filed at 03/11/2024 0649 Gross per 24 hour  Intake 930 ml  Output 600 ml  Net 330 ml   Filed Weights   03/08/24 2223  Weight: 63.4 kg    Examination:  General exam: Appears calm and comfortable, deconditioned, not in any acute distress Respiratory system: Clear to auscultation. Respiratory effort normal.  RR 14 Cardiovascular  system: S1 & S2 heard, RRR. No JVD, murmurs, rubs, gallops or clicks.  Gastrointestinal system: Abdomen is non distended, soft and non tender.  Normal bowel sounds heard. Central nervous system: Alert and oriented x 3. No focal neurological deficits. Extremities: No edema, no cyanosis, no clubbing. Skin: No rashes, lesions or ulcers Psychiatry: Judgement and insight appear normal. Mood & affect appropriate.     Data Reviewed: I have personally reviewed following labs and imaging studies  CBC: Recent Labs  Lab 03/08/24 1418 03/09/24 0315 03/10/24 0250 03/11/24 0313  WBC 7.5 10.0 6.6 5.4  NEUTROABS 5.1  --   --   --   HGB 12.7 13.4 13.2 12.8  HCT 38.7 40.6 39.6 37.6  MCV 96.5 95.1 94.1 92.2  PLT 257 242 278 302   Basic Metabolic Panel: Recent Labs  Lab 03/08/24 1418 03/09/24 0315 03/10/24  0250 03/11/24 0313  NA 139 136 139 136  K 3.6 3.4* 3.4* 3.6  CL 104 103 103 101  CO2 25 22 24 23   GLUCOSE 120* 91 149* 193*  BUN 18 17 16 22   CREATININE 0.82 0.77 0.68 0.59  CALCIUM  8.2* 8.0* 8.5* 8.4*   GFR: Estimated Creatinine Clearance: 36.1 mL/min (by C-G formula based on SCr of 0.59 mg/dL). Liver Function Tests: Recent Labs  Lab 03/08/24 1418 03/09/24 0315 03/10/24 0250 03/11/24 0313  AST 37 34 36 25  ALT 38 36 37 31  ALKPHOS 75 72 74 71  BILITOT 0.5 0.3 <0.2 <0.2  PROT 6.3* 6.0* 6.3* 6.1*  ALBUMIN  2.9* 2.8* 2.8* 2.7*   No results for input(s): LIPASE, AMYLASE in the last 168 hours. No results for input(s): AMMONIA in the last 168 hours. Coagulation Profile: Recent Labs  Lab 03/08/24 1418  INR 1.2   Cardiac Enzymes: No results for input(s): CKTOTAL, CKMB, CKMBINDEX, TROPONINI in the last 168 hours. BNP (last 3 results) Recent Labs    05/03/23 1602 03/08/24 2039  PROBNP 3,248* 6,531.0*   HbA1C: No results for input(s): HGBA1C in the last 72 hours. CBG: Recent Labs  Lab 03/10/24 1741 03/10/24 2054 03/11/24 0455 03/11/24 0832  03/11/24 1135  GLUCAP 146* 164* 169* 129* 125*   Lipid Profile: No results for input(s): CHOL, HDL, LDLCALC, TRIG, CHOLHDL, LDLDIRECT in the last 72 hours. Thyroid  Function Tests: No results for input(s): TSH, T4TOTAL, FREET4, T3FREE, THYROIDAB in the last 72 hours. Anemia Panel: No results for input(s): VITAMINB12, FOLATE, FERRITIN, TIBC, IRON, RETICCTPCT in the last 72 hours. Sepsis Labs: Recent Labs  Lab 03/08/24 1518 03/08/24 2039  PROCALCITON  --  <0.10  LATICACIDVEN 1.6  --     Recent Results (from the past 240 hours)  Blood Culture (routine x 2)     Status: None (Preliminary result)   Collection Time: 03/08/24  2:18 PM   Specimen: BLOOD  Result Value Ref Range Status   Specimen Description BLOOD LEFT ANTECUBITAL  Final   Special Requests   Final    BOTTLES DRAWN AEROBIC AND ANAEROBIC Blood Culture adequate volume   Culture   Final    NO GROWTH 3 DAYS Performed at [facility], [address] [place], [place] [address]    Report Status PENDING  Incomplete  Blood Culture (routine x 2)     Status: None (Preliminary result)   Collection Time: 03/08/24  2:23 PM   Specimen: BLOOD  Result Value Ref Range Status   Specimen Description BLOOD RIGHT ANTECUBITAL  Final   Special Requests   Final    BOTTLES DRAWN AEROBIC AND ANAEROBIC Blood Culture adequate volume   Culture   Final    NO GROWTH 3 DAYS Performed at [facility], [address], [place], [place] [address]    Report Status PENDING  Incomplete  Resp panel by RT-PCR (RSV, Flu A&B, Covid) Anterior Nasal Swab     Status: Abnormal   Collection Time: 03/08/24  3:09 PM   Specimen: Anterior Nasal Swab  Result Value Ref Range Status   SARS Coronavirus 2 by RT PCR POSITIVE (A) NEGATIVE Final   Influenza A by PCR NEGATIVE NEGATIVE Final   Influenza B by PCR NEGATIVE NEGATIVE Final    Comment: (NOTE) The Xpert Xpress SARS-CoV-2/FLU/RSV plus assay is intended as an aid in  the diagnosis of influenza from Nasopharyngeal swab specimens and should not be used as a sole basis for  treatment. Nasal washings and aspirates are unacceptable for Xpert Xpress SARS-CoV-2/FLU/RSV testing.  Fact Sheet for Patients: bloggercourse.com  Fact Sheet for Healthcare Providers: seriousbroker.it  This test is not yet approved or cleared by the [place]  FDA and has been authorized for detection and/or diagnosis of SARS-CoV-2 by FDA under an Emergency Use Authorization (EUA). This EUA will remain in effect (meaning this test can be used) for the duration of the COVID-19 declaration under Section 564(b)(1) of the Act, 21 U.S.C. section 360bbb-3(b)(1), unless the authorization is terminated or revoked.     Resp Syncytial Virus by PCR NEGATIVE NEGATIVE Final    Comment: (NOTE) Fact Sheet for Patients: bloggercourse.com  Fact Sheet for Healthcare Providers: seriousbroker.it  This test is not yet approved or cleared by the [place]  FDA and has been authorized for detection and/or diagnosis of SARS-CoV-2 by FDA under an Emergency Use Authorization (EUA). This EUA will remain in effect (meaning this test can be used) for the duration of the COVID-19 declaration under Section 564(b)(1) of the Act, 21 U.S.C. section 360bbb-3(b)(1), unless the authorization is terminated or revoked.  Performed at [facility], [address] [place], [place] [address]   MRSA culture     Status: None   Collection Time: 03/08/24  3:26 PM   Specimen: Nasal Mucosa; Body Fluid  Result Value Ref Range Status   Specimen Description NASOPHARYNGEAL  Final   Special Requests NONE  Final   Culture   Final    NO MRSA DETECTED Performed at [facility], [address], [place], [place] [address]    Report Status 03/10/2024 FINAL  Final  MRSA Next Gen by PCR, Nasal     Status: None    Collection Time: 03/08/24  3:37 PM   Specimen: Nasal Mucosa; Nasal Swab  Result Value Ref Range Status   MRSA by PCR Next Gen NOT DETECTED NOT DETECTED Final    Comment: (NOTE) The GeneXpert MRSA Assay (FDA approved for NASAL specimens only), is one component of a comprehensive MRSA colonization surveillance program. It is not intended to diagnose MRSA infection nor to guide or monitor treatment for MRSA infections. Test performance is not FDA approved in patients less than 72 years old. Performed at [facility], [address], [place], [place] [address]    Radiology Studies: No results found.  Scheduled Meds:  apixaban   2.5 mg Oral BID   azithromycin   500 mg Oral Daily   colchicine   0.6 mg Oral Daily   dexamethasone   6 mg Oral Q24H   feeding supplement  237 mL Oral BID BM   gabapentin   200 mg Oral BID   insulin  aspart  0-9 Units Subcutaneous TID WC   levothyroxine   88 mcg Oral QAC breakfast   metoprolol  succinate  25 mg Oral QPC supper   pantoprazole   40 mg Oral Daily   pramipexole   1 mg Oral QPM   pravastatin   20 mg Oral Daily   sodium chloride  flush  3 mL Intravenous Q12H   Continuous Infusions:  cefTRIAXone  (ROCEPHIN )  IV 2 g (03/10/24 2251)     LOS: 3 days    Time spent: 50 mins    Darcel Dawley, MD [facility]   If 7PM-7AM, please contact night-coverage  "

## 2024-03-11 NOTE — Evaluation (Signed)
 Occupational Therapy Evaluation Patient Details Name: Dana Bradford MRN: [id number] DOB: 1930/12/31 Today's Date: 03/11/2024   History of Present Illness   Pt is a 89 y.o. female presenting 1/16 from [facility] SNF with fever, SOB, AMS. Recently admitted 1/8-1/13 with workup for UTI, COVID. PMH: HTN, HLD, DM, GERD, afib, hypothyroidism, CKD IIIb, CVA, chronic CHF, macular degeneration, glaucoma, gout, anemia, breast cancer status postlumpectomy, venous insufficiency, venous insufficiency ulcer, RLS, recurrent UTI     Clinical Impressions PTA, pt recently at [facility] rehab per chart; difficulty providing PLOF during session due to questionable historian. Pt currently requiring up to mod A for bed mobility and max-total A +2 for transfers. Pt following one step commands with increased time and inconsistently; likely due to slower processing speed and pt hard of hearing. Will continue to follow. Patient will benefit from continued inpatient follow up therapy, <3 hours/day      If plan is discharge home, recommend the following:   Two people to help with walking and/or transfers;A lot of help with bathing/dressing/bathroom;Assistance with cooking/housework;Direct supervision/assist for medications management;Direct supervision/assist for financial management;Assist for transportation;Help with stairs or ramp for entrance;Supervision due to cognitive status     Functional Status Assessment   Patient has had a recent decline in their functional status and demonstrates the ability to make significant improvements in function in a reasonable and predictable amount of time.     Equipment Recommendations   None recommended by OT (defer)     Recommendations for Other Services         Precautions/Restrictions   Precautions Precautions: Fall Precaution/Restrictions Comments: fear of falling increasing fall likelihood Restrictions Weight Bearing Restrictions Per Provider Order: No      Mobility Bed Mobility Overal bed mobility: Needs Assistance Bed Mobility: Supine to Sit, Sit to Supine     Supine to sit: HOB elevated, Supervision, +2 for physical assistance Sit to supine: Mod assist   General bed mobility comments: with increased cuing pt is able to bring herself to EoB, needs mod A for returning LE to bed    Transfers Overall transfer level: Needs assistance Equipment used: 2 person hand held assist Transfers: Sit to/from Stand, Bed to chair/wheelchair/BSC Sit to Stand: +2 physical assistance, +2 safety/equipment, Max assist, Total assist     Step pivot transfers: Total assist, Max assist, +2 physical assistance     General transfer comment: pt requires modAx2 for bringing feet to floor due to 4'10 stature, modAx2 for steadying in standing, pivotal steps to [facility] with maxAx2, incontinent of stool during transfer, pt requires modAx2 for initial pericare, and total A for coming to standing with one assist for second person to provide pericare, pt with increased fear of falling and decreased command follow for coming all the way to upright, total A for pivot back to bed      Balance Overall balance assessment: Needs assistance Sitting-balance support: No upper extremity supported, Feet unsupported, Bilateral upper extremity supported Sitting balance-Leahy Scale: Fair     Standing balance support: Bilateral upper extremity supported, During functional activity Standing balance-Leahy Scale: Zero                             ADL either performed or assessed with clinical judgement   ADL Overall ADL's : Needs assistance/impaired     Grooming: Contact guard assist;Sitting               Lower Body Dressing: Total assistance;Bed level  Toilet Transfer: Maximal assistance;+2 for physical assistance;+2 for safety/equipment;Total assistance           Functional mobility during ADLs: Maximal assistance;+2 for physical assistance;+2 for  safety/equipment       Vision Ability to See in Adequate Light: 0 Adequate Patient Visual Report: No change from baseline       Perception         Praxis         Pertinent Vitals/Pain Pain Assessment Pain Assessment: Faces Faces Pain Scale: No hurt Pain Intervention(s): Monitored during session     Extremity/Trunk Assessment Upper Extremity Assessment Upper Extremity Assessment: Defer to OT evaluation   Lower Extremity Assessment Lower Extremity Assessment: Defer to PT evaluation   Cervical / Trunk Assessment Cervical / Trunk Assessment: Kyphotic   Communication Communication Communication: Impaired Factors Affecting Communication: Hearing impaired   Cognition Arousal: Alert Behavior During Therapy: WFL for tasks assessed/performed Cognition: History of cognitive impairments                               Following commands: Impaired Following commands impaired: Follows one step commands with increased time, Follows multi-step commands inconsistently     Cueing  General Comments   Cueing Techniques: Verbal cues;Tactile cues;Visual cues  noted max HR 170   Exercises     Shoulder Instructions      Home Living Family/patient expects to be discharged to:: Skilled nursing facility (came from [facility] most recently)                                        Prior Functioning/Environment Prior Level of Function : Patient poor historian/Family not available                    OT Problem List: Decreased strength;Decreased activity tolerance;Impaired balance (sitting and/or standing);Decreased coordination;Decreased cognition;Decreased safety awareness;Decreased knowledge of use of DME or AE;Decreased knowledge of precautions;Cardiopulmonary status limiting activity   OT Treatment/Interventions: Self-care/ADL training;Therapeutic exercise;Neuromuscular education;Energy conservation;DME and/or AE instruction;Therapeutic  activities;Cognitive remediation/compensation;Patient/family education;Balance training      OT Goals(Current goals can be found in the care plan section)   Acute Rehab OT Goals Patient Stated Goal: get better OT Goal Formulation: With patient Time For Goal Achievement: 03/18/24 Potential to Achieve Goals: Fair   OT Frequency:  Min 2X/week    Co-evaluation              AM-PAC OT 6 Clicks Daily Activity     Outcome Measure Help from another person eating meals?: A Little Help from another person taking care of personal grooming?: A Little Help from another person toileting, which includes using toliet, bedpan, or urinal?: Total Help from another person bathing (including washing, rinsing, drying)?: Total Help from another person to put on and taking off regular upper body clothing?: A Lot Help from another person to put on and taking off regular lower body clothing?: Total 6 Click Score: 11   End of Session Equipment Utilized During Treatment: Gait belt;Other (comment) [facility]) Nurse Communication: Mobility status;Need for lift equipment;Other (comment)  Activity Tolerance: Patient tolerated treatment well Patient left: with call bell/phone within reach;in bed;with bed alarm set  OT Visit Diagnosis: Unsteadiness on feet (R26.81);Muscle weakness (generalized) (M62.81);Cognitive communication deficit (R41.841)                Time: 8986-8943  OT Time Calculation (min): 43 min Charges:  OT General Charges $OT Visit: 1 Visit OT Evaluation $OT Eval Low Complexity: 1 Low  Elma JONETTA Lebron FREDERICK, OTR/L [facility] Acute Rehabilitation Office: [phone]   Elma JONETTA Lebron 03/11/2024, 2:29 PM

## 2024-03-11 NOTE — Evaluation (Addendum)
 Physical Therapy Evaluation Patient Details Name: Dana Bradford MRN: [id number] DOB: 05-31-1930 Today's Date: 03/11/2024  History of Present Illness  Pt is a 89 y.o. female presenting 1/16 from [facility] SNF with fever, SOB, AMS. Recently admitted 1/8-1/13 with workup for UTI, COVID. PMH: HTN, HLD, DM, GERD, afib, hypothyroidism, CKD IIIb, CVA, chronic CHF, macular degeneration, glaucoma, gout, anemia, breast cancer status postlumpectomy, venous insufficiency, venous insufficiency ulcer, RLS, recurrent UTI  Clinical Impression  Pt poor historian, oriented to hospital but not where the hospital and does not remember her stay at [facility]. Per records and pt report she has experienced many falls, as a result her mobility is limited by her fear of falling. Pt with decreased command follow during transfers as a result.  Pt also limited by Afib in the 170s with exertion. Pt is supervision for getting to EoB, but then becomes, mod-maxAx2 STS and step pivot to [facility], or total Ax1 for STS for pericare and pivot back to bed.  PT recommends return to [facility] at discharge. PT will continue to follow acutely.       If plan is discharge home, recommend the following: Two people to help with walking and/or transfers;A lot of help with bathing/dressing/bathroom;Direct supervision/assist for financial management;Direct supervision/assist for medications management;Assistance with cooking/housework;Assist for transportation;Help with stairs or ramp for entrance;Supervision due to cognitive status   Can travel by private vehicle   No          Functional Status Assessment Patient has had a recent decline in their functional status and demonstrates the ability to make significant improvements in function in a reasonable and predictable amount of time.     Precautions / Restrictions Precautions Precautions: Fall Precaution/Restrictions Comments: fear of falling increasing fall likelihood Restrictions Weight  Bearing Restrictions Per Provider Order: No      Mobility  Bed Mobility Overal bed mobility: Needs Assistance Bed Mobility: Supine to Sit, Sit to Supine     Supine to sit: HOB elevated, Supervision, +2 for physical assistance Sit to supine: Mod assist   General bed mobility comments: with increased cuing pt is able to bring herself to EoB, needs mod A for returning LE to bed    Transfers Overall transfer level: Needs assistance Equipment used: 2 person hand held assist Transfers: Sit to/from Stand, Bed to chair/wheelchair/BSC Sit to Stand: +2 physical assistance, +2 safety/equipment, Max assist, Total assist   Step pivot transfers: Total assist, Max assist, +2 physical assistance       General transfer comment: pt requires modAx2 for bringing feet to floor due to 4'10 stature, modAx2 for steadying in standing, pivotal steps to [facility] with maxAx2, incontinent of stool during transfer, pt requires modAx2 for initial pericare, and total A for coming to standing with one assist for second person to provide pericare, pt with increased fear of falling and decreased command follow for coming all the way to upright, total A for pivot back to bed         Balance Overall balance assessment: Needs assistance Sitting-balance support: No upper extremity supported, Feet unsupported, Bilateral upper extremity supported Sitting balance-Leahy Scale: Fair     Standing balance support: Bilateral upper extremity supported, During functional activity Standing balance-Leahy Scale: Zero                               Pertinent Vitals/Pain Pain Assessment Pain Assessment: Faces Faces Pain Scale: No hurt Pain Intervention(s): Monitored during session  Home Living Family/patient expects to be discharged to:: Skilled nursing facility (came from [facility])                        Prior Function Prior Level of Function : Patient poor historian/Family not available                      Extremity/Trunk Assessment   Upper Extremity Assessment Upper Extremity Assessment: Defer to OT evaluation    Lower Extremity Assessment Lower Extremity Assessment: Generalized weakness    Cervical / Trunk Assessment Cervical / Trunk Assessment: Kyphotic  Communication   Communication Communication: Impaired Factors Affecting Communication: Hearing impaired    Cognition   Behavior During Therapy: WFL for tasks assessed/performed   PT - Cognitive impairments: Orientation, Problem solving, Sequencing, Memory   Orientation impairments: Place, Situation                   PT - Cognition Comments: knows she is in hosptial but not which city forgets that she was at [facility] Following commands: Impaired Following commands impaired: Follows one step commands with increased time, Follows multi-step commands inconsistently     Cueing Cueing Techniques: Verbal cues, Tactile cues, Visual cues     General Comments General comments (skin integrity, edema, etc.): max noted HR 172bpm with having BM, in 120-170s with all OOB activity        Assessment/Plan    PT Assessment Patient needs continued PT services  PT Problem List Decreased strength;Decreased activity tolerance;Decreased balance;Decreased mobility;Decreased coordination;Decreased cognition;Cardiopulmonary status limiting activity;Decreased safety awareness       PT Treatment Interventions DME instruction;Gait training;Functional mobility training;Therapeutic activities;Therapeutic exercise;Balance training;Cognitive remediation;Patient/family education    PT Goals (Current goals can be found in the Care Plan section)  Acute Rehab PT Goals PT Goal Formulation: Patient unable to participate in goal setting Time For Goal Achievement: 03/25/24 Potential to Achieve Goals: Fair    Frequency Min 2X/week        AM-PAC PT 6 Clicks Mobility  Outcome Measure Help needed turning from your back  to your side while in a flat bed without using bedrails?: A Little Help needed moving from lying on your back to sitting on the side of a flat bed without using bedrails?: A Little Help needed moving to and from a bed to a chair (including a wheelchair)?: Total Help needed standing up from a chair using your arms (e.g., wheelchair or bedside chair)?: Total Help needed to walk in hospital room?: Total Help needed climbing 3-5 steps with a railing? : Total 6 Click Score: 10    End of Session Equipment Utilized During Treatment: Gait belt Activity Tolerance: Patient tolerated treatment well Patient left: in bed;with call bell/phone within reach;with bed alarm set Nurse Communication: Mobility status;Other (comment) PT Visit Diagnosis: Unsteadiness on feet (R26.81);Muscle weakness (generalized) (M62.81);Difficulty in walking, not elsewhere classified (R26.2);History of falling (Z91.81);Repeated falls (R29.6)    Time: 8986-8940 PT Time Calculation (min) (ACUTE ONLY): 46 min   Charges:   PT Evaluation $PT Eval Moderate Complexity: 1 Mod PT Treatments $Therapeutic Activity: 8-22 mins PT General Charges $$ ACUTE PT VISIT: 1 Visit         Latayna Ritchie B. Fleeta Lapidus PT, DPT Acute Rehabilitation Services Please use secure chat or  Call Office [phone]   Almarie KATHEE Fleeta [facility] 03/11/2024, 12:11 PM

## 2024-03-12 DIAGNOSIS — J9601 Acute respiratory failure with hypoxia: Secondary | ICD-10-CM | POA: Diagnosis not present

## 2024-03-12 LAB — CBC
HCT: 38.2 % (ref 36.0–46.0)
Hemoglobin: 13 g/dL (ref 12.0–15.0)
MCH: 31.8 pg (ref 26.0–34.0)
MCHC: 34 g/dL (ref 30.0–36.0)
MCV: 93.4 fL (ref 80.0–100.0)
Platelets: 287 K/uL (ref 150–400)
RBC: 4.09 MIL/uL (ref 3.87–5.11)
RDW: 13.1 % (ref 11.5–15.5)
WBC: 8.6 K/uL (ref 4.0–10.5)
nRBC: 0.2 % (ref 0.0–0.2)

## 2024-03-12 LAB — COMPREHENSIVE METABOLIC PANEL WITH GFR
ALT: 32 U/L (ref 0–44)
AST: 26 U/L (ref 15–41)
Albumin: 2.9 g/dL — ABNORMAL LOW (ref 3.5–5.0)
Alkaline Phosphatase: 70 U/L (ref 38–126)
Anion gap: 12 (ref 5–15)
BUN: 22 mg/dL (ref 8–23)
CO2: 23 mmol/L (ref 22–32)
Calcium: 8.4 mg/dL — ABNORMAL LOW (ref 8.9–10.3)
Chloride: 102 mmol/L (ref 98–111)
Creatinine, Ser: 0.66 mg/dL (ref 0.44–1.00)
GFR, Estimated: >60 mL/min
Glucose, Bld: 173 mg/dL — ABNORMAL HIGH (ref 70–99)
Potassium: 3.4 mmol/L — ABNORMAL LOW (ref 3.5–5.1)
Sodium: 136 mmol/L (ref 135–145)
Total Bilirubin: 0.2 mg/dL (ref 0.0–1.2)
Total Protein: 6 g/dL — ABNORMAL LOW (ref 6.5–8.1)

## 2024-03-12 LAB — GLUCOSE, CAPILLARY
Glucose-Capillary: 167 mg/dL — ABNORMAL HIGH (ref 70–99)
Glucose-Capillary: 144 mg/dL — ABNORMAL HIGH (ref 70–99)
Glucose-Capillary: 128 mg/dL — ABNORMAL HIGH (ref 70–99)

## 2024-03-12 MED ORDER — HYDRALAZINE HCL 50 MG PO TABS
50.0000 mg | ORAL_TABLET | Freq: Three times a day (TID) | ORAL | Status: DC
  Administered 2024-03-12 – 2024-03-16 (×12): 50 mg via ORAL
  Filled 2024-03-12 (×12): qty 1

## 2024-03-12 MED ORDER — POTASSIUM CHLORIDE CRYS ER 20 MEQ PO TBCR
40.0000 meq | EXTENDED_RELEASE_TABLET | Freq: Once | ORAL | Status: AC
  Administered 2024-03-12: 40 meq via ORAL
  Filled 2024-03-12: qty 2

## 2024-03-12 NOTE — Progress Notes (Signed)
 " PROGRESS NOTE    Dana Bradford  FMW:[id number] DOB: November 19, 1930 DOA: 03/08/2024 PCP: Almarie Waddell NOVAK, NP   Brief Narrative:  This 89 yrs old female with medical history significant of hypertension, hyperlipidemia, diabetes, GERD, atrial fibrillation, hypothyroidism, CKD 3B, CVA, chronic CHF, macular degeneration, glaucoma, gout, anemia, breast cancer status postlumpectomy, venous insufficiency, venous insufficiency ulcer, RLS, recurrent UTI presenting with fever, shortness of breath, altered mental status.  Patient had recently tested positive for COVID-19 (admitted 1/8 to 1/13) and had been discharged to a skilled nursing facility.  Patient developed fever, shortness of breath at the facility and presented again to hospital.  Diagnosed with pneumonia and started on antibiotics.    Assessment & Plan:   Principal Problem:   Acute respiratory failure with hypoxia (HCC) Active Problems:   Restless leg syndrome   Essential hypertension, benign   GERD (gastroesophageal reflux disease)   Hypothyroidism   Hyperlipidemia   CHF (congestive heart failure) (HCC)   History of CVA (cerebrovascular accident)   Glaucoma   Permanent atrial fibrillation (HCC)   Controlled type 2 diabetes mellitus with hyperglycemia, without long-term current use of insulin  (HCC)   Stage 3b chronic kidney disease (HCC)   Malignant neoplasm of overlapping sites of left female breast (HCC)   COVID-19   Gout  Acute hypoxic respiratory failure: Likely due to pneumonia. No baseline oxygen use.  Continue supplemental oxygen.    Pneumonia.  COVID-19 infection.  She presented with fever, shortness of breath. Patient had tested positive for COVID-19 on 02/29/2024.  Admitted here from 1/8 to 1/13.  She was not hypoxic and did not receive antivirals, steroids or antibiotics during that admission. Patient now presenting with hypoxia 8 days after initial COVID-19 diagnosis. Pro-calcitonin is not elevated.  Patient may have a  viral pneumonia from COVID-19 or a bacterial pneumonia or a combination. . Given hypoxia, patient was started on remdesivir  and dexamethasone .  As bacterial pneumonia cannot be ruled out, continue antibiotics.    CHF (HFpEF): Last TTE with EF 55-60% Patient presented with elevated proBNP but no signs of volume overload. Will assess volume status daily and diurese as needed.   Hypertension: Continue with metoprolol  as patient went into RVR the last time this was held. Hold off on diuresis for now. Start hydralazine  25 mg every 8 hours for better blood pressure control.   Hyperlipidemia: Continue home pravastatin .   Diabetes Mellitus II: Continue SSI.   GERD: Continue home PPI.   Atrial fibrillation: Continue home metoprolol  and Eliquis . HR well controlled. Can consider IV metoprolol  2.5 mg if patient goes into RVR.    History of CVA: Continue home statin, Eliquis .   Gout: Treated for flare with prednisone  during last admission. Continue daily colchicine .   Anemia: Hemoglobin normal.    History of breast cancer: Status post lobectomy Continue outpatient follow-up   Venous insufficiency: Venous insufficiency ulcer: Noted.   RLS: Continue home gabapentin  and pramipexole .     DVT prophylaxis:Eliquis   Code Status: Full code Family Communication: No family at bed side Disposition Plan:    Status is: Inpatient Remains inpatient appropriate because: Patient admitted for pneumonia in the setting of COVID.  Started on IV antibiotics and IV antivirals.  Patient is still remains on supplemental oxygen.  Patient is not medically ready for discharge yet.   Consultants:  None  Procedures: None  Antimicrobials: Anti-infectives (From admission, onward)    Start     Dose/Rate Route Frequency Ordered Stop   03/10/24 1000  remdesivir   100 mg in sodium chloride  0.9 % 100 mL IVPB       Placed in Followed by Linked Group   100 mg 200 mL/hr over 30 Minutes Intravenous  Daily 03/09/24 1516 03/11/24 1008   03/09/24 1615  remdesivir  200 mg in sodium chloride  0.9% 250 mL IVPB       Placed in Followed by Linked Group   200 mg 580 mL/hr over 30 Minutes Intravenous Once 03/09/24 1516 03/09/24 1728   03/09/24 1500  azithromycin  (ZITHROMAX ) tablet 500 mg        500 mg Oral Daily 03/08/24 1826     03/08/24 2200  cefTRIAXone  (ROCEPHIN ) 2 g in sodium chloride  0.9 % 100 mL IVPB        2 g 200 mL/hr over 30 Minutes Intravenous Every 24 hours 03/08/24 1826     03/08/24 1545  ceFEPIme  (MAXIPIME ) 2 g in sodium chloride  0.9 % 100 mL IVPB        2 g 200 mL/hr over 30 Minutes Intravenous  Once 03/08/24 1536 03/08/24 1650   03/08/24 1545  vancomycin  (VANCOREADY) IVPB 1500 mg/300 mL        1,500 mg 150 mL/hr over 120 Minutes Intravenous  Once 03/08/24 1536 03/08/24 2025   03/08/24 1515  cefTRIAXone  (ROCEPHIN ) 1 g in sodium chloride  0.9 % 100 mL IVPB  Status:  Discontinued        1 g 200 mL/hr over 30 Minutes Intravenous  Once 03/08/24 1508 03/08/24 1524   03/08/24 1515  azithromycin  (ZITHROMAX ) tablet 500 mg        500 mg Oral  Once 03/08/24 1508 03/08/24 1621       Subjective: Patient was seen and examined at bedside. Overnight events noted. Patient seems much improved.  She denies any concerns,  reports she is feeling much improved.  Objective: Vitals:   03/11/24 1900 03/11/24 2300 03/12/24 0811 03/12/24 1100  BP: (!) 130/93 (!) 126/91 (!) 144/101 (!) 142/105  Pulse: 67 92 96 68  Resp: 15 15 17 15   Temp: 98.6 F (37 C) (!) 97.5 F (36.4 C) (!) 97.4 F (36.3 C)   TempSrc: Oral Oral Oral   SpO2: 97% 94% 90% 96%  Weight:      Height:        Intake/Output Summary (Last 24 hours) at 03/12/2024 1157 Last data filed at 03/11/2024 2015 Gross per 24 hour  Intake 440.06 ml  Output --  Net 440.06 ml   Filed Weights   03/08/24 2223  Weight: 63.4 kg    Examination:  General exam: Appears calm and comfortable, deconditioned, not in any acute  distress Respiratory system: CTA Bilaterally . Respiratory effort normal.  RR 14 Cardiovascular system: S1 & S2 heard, RRR. No JVD, murmurs, rubs, gallops or clicks.  Gastrointestinal system: Abdomen is non distended, soft and non tender.  Normal bowel sounds heard. Central nervous system: Alert and oriented x 3. No focal neurological deficits. Extremities: No edema, no cyanosis, no clubbing. Skin: No rashes, lesions or ulcers Psychiatry: Judgement and insight appear normal. Mood & affect appropriate.     Data Reviewed: I have personally reviewed following labs and imaging studies  CBC: Recent Labs  Lab 03/08/24 1418 03/09/24 0315 03/10/24 0250 03/11/24 0313 03/12/24 0323  WBC 7.5 10.0 6.6 5.4 8.6  NEUTROABS 5.1  --   --   --   --   HGB 12.7 13.4 13.2 12.8 13.0  HCT 38.7 40.6 39.6 37.6 38.2  MCV 96.5 95.1  94.1 92.2 93.4  PLT 257 242 278 302 287   Basic Metabolic Panel: Recent Labs  Lab 03/08/24 1418 03/09/24 0315 03/10/24 0250 03/11/24 0313 03/12/24 0323  NA 139 136 139 136 136  K 3.6 3.4* 3.4* 3.6 3.4*  CL 104 103 103 101 102  CO2 25 22 24 23 23   GLUCOSE 120* 91 149* 193* 173*  BUN 18 17 16 22 22   CREATININE 0.82 0.77 0.68 0.59 0.66  CALCIUM  8.2* 8.0* 8.5* 8.4* 8.4*   GFR: Estimated Creatinine Clearance: 36.1 mL/min (by C-G formula based on SCr of 0.66 mg/dL). Liver Function Tests: Recent Labs  Lab 03/08/24 1418 03/09/24 0315 03/10/24 0250 03/11/24 0313 03/12/24 0323  AST 37 34 36 25 26  ALT 38 36 37 31 32  ALKPHOS 75 72 74 71 70  BILITOT 0.5 0.3 <0.2 <0.2 0.2  PROT 6.3* 6.0* 6.3* 6.1* 6.0*  ALBUMIN  2.9* 2.8* 2.8* 2.7* 2.9*   No results for input(s): LIPASE, AMYLASE in the last 168 hours. No results for input(s): AMMONIA in the last 168 hours. Coagulation Profile: Recent Labs  Lab 03/08/24 1418  INR 1.2   Cardiac Enzymes: No results for input(s): CKTOTAL, CKMB, CKMBINDEX, TROPONINI in the last 168 hours. BNP (last 3  results) Recent Labs    05/03/23 1602 03/08/24 2039  PROBNP 3,248* 6,531.0*   HbA1C: No results for input(s): HGBA1C in the last 72 hours. CBG: Recent Labs  Lab 03/11/24 0832 03/11/24 1135 03/11/24 1655 03/11/24 2055 03/12/24 0640  GLUCAP 129* 125* 161* 186* 167*   Lipid Profile: No results for input(s): CHOL, HDL, LDLCALC, TRIG, CHOLHDL, LDLDIRECT in the last 72 hours. Thyroid  Function Tests: No results for input(s): TSH, T4TOTAL, FREET4, T3FREE, THYROIDAB in the last 72 hours. Anemia Panel: No results for input(s): VITAMINB12, FOLATE, FERRITIN, TIBC, IRON, RETICCTPCT in the last 72 hours. Sepsis Labs: Recent Labs  Lab 03/08/24 1518 03/08/24 2039  PROCALCITON  --  <0.10  LATICACIDVEN 1.6  --     Recent Results (from the past 240 hours)  Blood Culture (routine x 2)     Status: None (Preliminary result)   Collection Time: 03/08/24  2:18 PM   Specimen: BLOOD  Result Value Ref Range Status   Specimen Description BLOOD LEFT ANTECUBITAL  Final   Special Requests   Final    BOTTLES DRAWN AEROBIC AND ANAEROBIC Blood Culture adequate volume   Culture   Final    NO GROWTH 4 DAYS Performed at [facility], [address] [place], [place] [address]    Report Status PENDING  Incomplete  Blood Culture (routine x 2)     Status: None (Preliminary result)   Collection Time: 03/08/24  2:23 PM   Specimen: BLOOD  Result Value Ref Range Status   Specimen Description BLOOD RIGHT ANTECUBITAL  Final   Special Requests   Final    BOTTLES DRAWN AEROBIC AND ANAEROBIC Blood Culture adequate volume   Culture   Final    NO GROWTH 4 DAYS Performed at [facility], [address], [place], [place] [address]    Report Status PENDING  Incomplete  Resp panel by RT-PCR (RSV, Flu A&B, Covid) Anterior Nasal Swab     Status: Abnormal   Collection Time: 03/08/24  3:09 PM   Specimen: Anterior Nasal Swab  Result Value Ref Range Status   SARS  Coronavirus 2 by RT PCR POSITIVE (A) NEGATIVE Final   Influenza A by PCR NEGATIVE NEGATIVE Final  Influenza B by PCR NEGATIVE NEGATIVE Final    Comment: (NOTE) The Xpert Xpress SARS-CoV-2/FLU/RSV plus assay is intended as an aid in the diagnosis of influenza from Nasopharyngeal swab specimens and should not be used as a sole basis for treatment. Nasal washings and aspirates are unacceptable for Xpert Xpress SARS-CoV-2/FLU/RSV testing.  Fact Sheet for Patients: bloggercourse.com  Fact Sheet for Healthcare Providers: seriousbroker.it  This test is not yet approved or cleared by the [place]  FDA and has been authorized for detection and/or diagnosis of SARS-CoV-2 by FDA under an Emergency Use Authorization (EUA). This EUA will remain in effect (meaning this test can be used) for the duration of the COVID-19 declaration under Section 564(b)(1) of the Act, 21 U.S.C. section 360bbb-3(b)(1), unless the authorization is terminated or revoked.     Resp Syncytial Virus by PCR NEGATIVE NEGATIVE Final    Comment: (NOTE) Fact Sheet for Patients: bloggercourse.com  Fact Sheet for Healthcare Providers: seriousbroker.it  This test is not yet approved or cleared by the [place]  FDA and has been authorized for detection and/or diagnosis of SARS-CoV-2 by FDA under an Emergency Use Authorization (EUA). This EUA will remain in effect (meaning this test can be used) for the duration of the COVID-19 declaration under Section 564(b)(1) of the Act, 21 U.S.C. section 360bbb-3(b)(1), unless the authorization is terminated or revoked.  Performed at [facility], [address], [place], [place] [address]   MRSA culture     Status: None   Collection Time: 03/08/24  3:26 PM   Specimen: Nasal Mucosa; Body Fluid  Result Value Ref Range Status   Specimen Description NASOPHARYNGEAL  Final    Special Requests NONE  Final   Culture   Final    NO MRSA DETECTED Performed at [facility], [address] [place], [place] [address]    Report Status 03/10/2024 FINAL  Final  MRSA Next Gen by PCR, Nasal     Status: None   Collection Time: 03/08/24  3:37 PM   Specimen: Nasal Mucosa; Nasal Swab  Result Value Ref Range Status   MRSA by PCR Next Gen NOT DETECTED NOT DETECTED Final    Comment: (NOTE) The GeneXpert MRSA Assay (FDA approved for NASAL specimens only), is one component of a comprehensive MRSA colonization surveillance program. It is not intended to diagnose MRSA infection nor to guide or monitor treatment for MRSA infections. Test performance is not FDA approved in patients less than 52 years old. Performed at [facility], [address] [place], [place] [address]    Radiology Studies: No results found.  Scheduled Meds:  apixaban   2.5 mg Oral BID   azithromycin   500 mg Oral Daily   colchicine   0.6 mg Oral Daily   dexamethasone   6 mg Oral Q24H   feeding supplement  237 mL Oral BID BM   gabapentin   200 mg Oral BID   hydrALAZINE   50 mg Oral Q8H   insulin  aspart  0-9 Units Subcutaneous TID WC   levothyroxine   88 mcg Oral QAC breakfast   metoprolol  succinate  25 mg Oral QPC supper   pantoprazole   40 mg Oral Daily   potassium chloride  SA  40 mEq Oral Once   pramipexole   1 mg Oral QPM   pravastatin   20 mg Oral Daily   sodium chloride  flush  3 mL Intravenous Q12H   Continuous Infusions:  cefTRIAXone  (ROCEPHIN )  IV 2 g (03/11/24 2114)     LOS: 4  days    Time spent: 50 mins    Darcel Dawley, MD [facility]   If 7PM-7AM, please contact night-coverage  "

## 2024-03-12 NOTE — TOC Progression Note (Signed)
 Transition of Care [facility]) - Progression Note    Patient Details  Name: Dana Bradford MRN: [id number] Date of Birth: December 20, 1930  Transition of [facility] CM/SW Contact  Montie LOISE Louder, [place] Phone Number: 03/12/2024, 4:47 PM  Clinical Narrative:     Pt from [facility]- informed [facility] of potential d/c tomorrow or the next day.   TOC will continue to follow and assist with discharge planning.   Montie Louder, MSW, LCSW Clinical Social Worker                      Expected Discharge Plan and Services                                               Social Drivers of Health (SDOH) Interventions SDOH Screenings   Food Insecurity: No Food Insecurity (03/08/2024)  Housing: Low Risk (03/08/2024)  Transportation Needs: No Transportation Needs (03/08/2024)  Utilities: Not At Risk (03/08/2024)  Alcohol  Screen: Low Risk (03/11/2024)  Financial Resource Strain: Patient Declined (06/25/2023)  Physical Activity: Unknown (06/25/2023)  Social Connections: Socially Isolated (03/08/2024)  Stress: Patient Declined (06/25/2023)  Tobacco Use: Medium Risk (03/09/2024)    Readmission Risk Interventions    03/03/2024    3:17 PM  Readmission Risk Prevention Plan  Transportation Screening Complete  PCP or Specialist Appt within 5-7 Days Complete  Home Care Screening Complete  Medication Review (RN CM) Complete

## 2024-03-12 NOTE — NC FL2 (Signed)
 " [place]  MEDICAID FL2 LEVEL OF CARE FORM     IDENTIFICATION  Patient Name: Dana Bradford Birthdate: 25-May-1930 Sex: female Admission Date (Current Location): 03/08/2024  [facility] and [place] Number:  Producer, Television/film/video and Address:  [facility] [place], [address] [place], [place] [address]      Provider Number: [id number]  Attending Physician Name and Address:  Leotis Bogus, MD  Relative Name and Phone Number:       Current Level of Care: Hospital Recommended Level of Care: Skilled Nursing Facility Prior Approval Number:    Date Approved/Denied:   PASRR Number: [id number]  Discharge Plan: SNF    Current Diagnoses: Patient Active Problem List   Diagnosis Date Noted   Acute respiratory failure with hypoxia (HCC) 03/08/2024   Gout 03/01/2024   Atrial fibrillation with RVR (HCC) 03/01/2024   COVID-19 02/29/2024   Malignant neoplasm of overlapping sites of left female breast (HCC) 09/26/2022   Osteoporosis 09/16/2021   Frequent UTI 06/25/2021   Stage 3b chronic kidney disease (HCC) 02/23/2021   Chronic anticoagulation 05/12/2020   Venous stasis ulcer (HCC)    Skin cancer    Glaucoma    Food allergy    Permanent atrial fibrillation (HCC)    Arthritis    Anemia    CHF (congestive heart failure) (HCC) 04/22/2020   History of revision of total replacement of left hip joint 01/31/2020   Hypothyroidism 01/19/2020   Hyperlipidemia 01/19/2020   Degeneration of lumbar intervertebral disc 12/02/2019   Degenerative spondylolisthesis 12/02/2019   Scoliosis deformity of spine 12/02/2019   Back pain 08/24/2019   Chronic venous insufficiency 03/01/2019   Diabetic ulcer of toe of right foot associated with type 2 diabetes mellitus, with fat layer exposed (HCC) 02/08/2019   Venous stasis dermatitis of left lower extremity 06/05/2018   OA (osteoarthritis) of hip 02/28/2018   Pain in joint of right hip 02/06/2018   Controlled type 2 diabetes mellitus  with hyperglycemia, without long-term current use of insulin  (HCC) 10/18/2017   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 macular degeneration of left eye 10/18/2017   Primary open angle glaucoma (POAG) of both eyes, indeterminate stage 10/18/2017   Retinal artery branch occlusion of right eye 06/12/2017   History of CVA (cerebrovascular accident) 05/2017   Status post amputation of lesser toe of right foot 05/13/2017   Lymphedema of both lower extremities 12/26/2014   Venous hypertension of both lower extremities 12/26/2014   Vaginal vault prolapse 08/12/2014   GERD (gastroesophageal reflux disease) 11/13/2012   Essential hypertension, benign 06/17/2012   Restless leg syndrome 06/05/2012    Orientation RESPIRATION BLADDER Height & Weight     Self, Time, Situation, Place  Normal Incontinent, External catheter Weight: 139 lb 12.4 oz (63.4 kg) Height:  4' 10 (147.3 cm)  BEHAVIORAL SYMPTOMS/MOOD NEUROLOGICAL BOWEL NUTRITION STATUS      Incontinent Diet (please see discharge summary)  AMBULATORY STATUS COMMUNICATION OF NEEDS Skin   Extensive Assist Verbally Other (Comment) (vascular ulcer Right Ankle , airborne precautions)                       Personal Care Assistance Level of Assistance  Bathing, Feeding, Dressing Bathing Assistance: Maximum assistance Feeding assistance: Maximum assistance Dressing Assistance: Maximum assistance     Functional Limitations Info  Sight, Hearing, Speech Sight Info: Adequate Hearing Info: Impaired Speech Info: Adequate    SPECIAL CARE FACTORS FREQUENCY  PT (By licensed PT), OT (By licensed OT)  PT Frequency: 5x per week OT Frequency: 5x per week            Contractures Contractures Info: Not present    Additional Factors Info  Code Status Code Status Info: DNR Limted Allergies Info: Arthrotec (diclofenac -misoprostol) High Hypersensitivity Shortness Of Breath, Diarrhea   Doxycycline Medium  Nausea And Vomiting, Rash  Significant enough to d/c treatment and refuse to take more Vomiting (intolerance) The first time she took it she had N/V and the second she had a rash.    Latex Medium  Rash, Other (See Comments)   Amlodipine Not Specified  Swelling angioedema  Celebrex (celecoxib) Not Specified Intolerance Other (See Comments) GI Bleeding  Vancomycin  Not Specified  Other (See Comments) Acute kidney failure  Diclofenac  Low  Diarrhea   Shellfish Allergy Low  Itching, Rash           Current Medications (03/12/2024):  This is the current hospital active medication list Current Facility-Administered Medications  Medication Dose Route Frequency Provider Last Rate Last Admin   acetaminophen  (TYLENOL ) tablet 650 mg  650 mg Oral Q6H PRN Seena Marsa NOVAK, MD       Or   acetaminophen  (TYLENOL ) suppository 650 mg  650 mg Rectal Q6H PRN Seena Marsa NOVAK, MD       apixaban  (ELIQUIS ) tablet 2.5 mg  2.5 mg Oral BID Melvin, Alexander B, MD   2.5 mg at 03/12/24 0845   azithromycin  (ZITHROMAX ) tablet 500 mg  500 mg Oral Daily Melvin, Alexander B, MD   500 mg at 03/12/24 0845   cefTRIAXone  (ROCEPHIN ) 2 g in sodium chloride  0.9 % 100 mL IVPB  2 g Intravenous Q24H Leotis Bogus, MD 200 mL/hr at 03/11/24 2114 2 g at 03/11/24 2114   colchicine  tablet 0.6 mg  0.6 mg Oral Daily Melvin, Alexander B, MD   0.6 mg at 03/12/24 9153   dexamethasone  (DECADRON ) tablet 6 mg  6 mg Oral Q24H Mdala-Gausi, Masiku Agatha, MD   6 mg at 03/11/24 1716   feeding supplement (ENSURE PLUS HIGH PROTEIN) liquid 237 mL  237 mL Oral BID BM Melvin, Alexander B, MD   237 mL at 03/12/24 1337   gabapentin  (NEURONTIN ) capsule 200 mg  200 mg Oral BID Melvin, Alexander B, MD   200 mg at 03/12/24 0845   guaiFENesin  (ROBITUSSIN) 100 MG/5ML liquid 10 mL  10 mL Oral Q6H PRN Seena Marsa NOVAK, MD   10 mL at 03/09/24 2149   hydrALAZINE  (APRESOLINE ) tablet 50 mg  50 mg Oral Q8H Khatri, Pardeep, MD   50 mg at 03/12/24 1336   insulin  aspart (novoLOG ) injection 0-9  Units  0-9 Units Subcutaneous TID WC Melvin, Alexander B, MD   1 Units at 03/12/24 1337   levothyroxine  (SYNTHROID ) tablet 88 mcg  88 mcg Oral QAC breakfast Seena Marsa NOVAK, MD   88 mcg at 03/12/24 0845   melatonin tablet 5 mg  5 mg Oral QHS PRN Melvin, Alexander B, MD   5 mg at 03/11/24 2117   metoprolol  succinate (TOPROL -XL) 24 hr tablet 25 mg  25 mg Oral QPC supper Melvin, Alexander B, MD   25 mg at 03/11/24 1716   pantoprazole  (PROTONIX ) EC tablet 40 mg  40 mg Oral Daily Melvin, Alexander B, MD   40 mg at 03/12/24 0845   polyethylene glycol (MIRALAX  / GLYCOLAX ) packet 17 g  17 g Oral Daily PRN Seena Marsa NOVAK, MD       pramipexole  (MIRAPEX ) tablet 1 mg  1  mg Oral QPM Melvin, Alexander B, MD   1 mg at 03/11/24 1716   pravastatin  (PRAVACHOL ) tablet 20 mg  20 mg Oral Daily Melvin, Alexander B, MD   20 mg at 03/12/24 0845   sodium chloride  flush (NS) 0.9 % injection 3 mL  3 mL Intravenous Q12H Seena Marsa NOVAK, MD   3 mL at 03/12/24 9153     Discharge Medications: Please see discharge summary for a list of discharge medications.  Relevant Imaging Results:  Relevant Lab Results:   Additional Information SSN [id number]  Montie LOISE Louder, LCSW     "

## 2024-03-13 DIAGNOSIS — J9601 Acute respiratory failure with hypoxia: Secondary | ICD-10-CM | POA: Diagnosis not present

## 2024-03-13 LAB — CULTURE, BLOOD (ROUTINE X 2)
Culture: NO GROWTH
Culture: NO GROWTH
Special Requests: ADEQUATE
Special Requests: ADEQUATE

## 2024-03-13 LAB — GLUCOSE, CAPILLARY
Glucose-Capillary: 159 mg/dL — ABNORMAL HIGH (ref 70–99)
Glucose-Capillary: 211 mg/dL — ABNORMAL HIGH (ref 70–99)
Glucose-Capillary: 77 mg/dL (ref 70–99)
Glucose-Capillary: 196 mg/dL — ABNORMAL HIGH (ref 70–99)

## 2024-03-13 NOTE — Progress Notes (Signed)
" °   03/13/24 1800  Urine Measurement/Characteristics  Urine (mL) 200 mL  Urine Color Other (Comment) (Black)    "

## 2024-03-13 NOTE — Progress Notes (Signed)
 " PROGRESS NOTE    Dana Bradford  FMW:[id number] DOB: 1930-11-11 DOA: 03/08/2024 PCP: Almarie Waddell NOVAK, NP   Brief Narrative:  This 89 yrs old female with medical history significant of hypertension, hyperlipidemia, diabetes, GERD, atrial fibrillation, hypothyroidism, CKD 3B, CVA, chronic CHF, macular degeneration, glaucoma, gout, anemia, breast cancer status postlumpectomy, venous insufficiency, venous insufficiency ulcer, RLS, recurrent UTI presenting with fever, shortness of breath, altered mental status.  Patient had recently tested positive for COVID-19 (admitted 1/8 to 1/13) and had been discharged to a skilled nursing facility.  Patient developed fever, shortness of breath at the facility and presented again to hospital.  Diagnosed with pneumonia and started on antibiotics.    Assessment & Plan:   Principal Problem:   Acute respiratory failure with hypoxia (HCC) Active Problems:   Restless leg syndrome   Essential hypertension, benign   GERD (gastroesophageal reflux disease)   Hypothyroidism   Hyperlipidemia   CHF (congestive heart failure) (HCC)   History of CVA (cerebrovascular accident)   Glaucoma   Permanent atrial fibrillation (HCC)   Controlled type 2 diabetes mellitus with hyperglycemia, without long-term current use of insulin  (HCC)   Stage 3b chronic kidney disease (HCC)   Malignant neoplasm of overlapping sites of left female breast (HCC)   COVID-19   Gout  Acute hypoxic respiratory failure: Likely due to pneumonia. No baseline oxygen use.  Continue supplemental oxygen.    Pneumonia.  COVID-19 infection.  She presented with fever, shortness of breath. Patient had tested positive for COVID-19 on 02/29/2024.  Admitted here from 1/8 to 1/13.  She was not hypoxic and did not receive antivirals, steroids or antibiotics during that admission. Patient now presenting with hypoxia 8 days after initial COVID-19 diagnosis. Pro-calcitonin is not elevated.  Patient may have a  viral pneumonia from COVID-19 or a bacterial pneumonia or a combination.  Given hypoxia, patient was started on remdesivir  and dexamethasone .  As bacterial pneumonia cannot be ruled out, continue antibiotics.    CHF (HFpEF): Last TTE with EF 55-60% Patient presented with elevated proBNP but no signs of volume overload. Will assess volume status daily and diurese as needed.   Hypertension: Continue with metoprolol  as patient went into RVR the last time this was held. Hold off on diuresis for now. Continue hydralazine  25 mg every 8 hours for better blood pressure control.   Hyperlipidemia: Continue home pravastatin .   Diabetes Mellitus II: Continue SSI.   GERD: Continue home PPI.   Atrial fibrillation: Continue home metoprolol  and Eliquis . HR well controlled. Can consider IV metoprolol  2.5 mg if patient goes into RVR.    History of CVA: Continue home statin, Eliquis .   Gout: Treated for flare with prednisone  during last admission. Continue daily colchicine .   Anemia: Hemoglobin normal.    History of breast cancer: Status post lobectomy Continue outpatient follow-up   Venous insufficiency: Venous insufficiency ulcer: Noted.   RLS: Continue home gabapentin  and pramipexole .     DVT prophylaxis:Eliquis   Code Status: Full code Family Communication: No family at bed side Disposition Plan:    Status is: Inpatient Remains inpatient appropriate because: Patient admitted for pneumonia in the setting of COVID.  Started on IV antibiotics and IV antivirals.  Patient is still remains on supplemental oxygen.  Patient is now medically ready for discharge , likely tomorrow.    Consultants:  None  Procedures: None  Antimicrobials: Anti-infectives (From admission, onward)    Start     Dose/Rate Route Frequency Ordered Stop   03/10/24  1000  remdesivir  100 mg in sodium chloride  0.9 % 100 mL IVPB       Placed in Followed by Linked Group   100 mg 200 mL/hr over 30  Minutes Intravenous Daily 03/09/24 1516 03/11/24 1008   03/09/24 1615  remdesivir  200 mg in sodium chloride  0.9% 250 mL IVPB       Placed in Followed by Linked Group   200 mg 580 mL/hr over 30 Minutes Intravenous Once 03/09/24 1516 03/09/24 1728   03/09/24 1500  azithromycin  (ZITHROMAX ) tablet 500 mg        500 mg Oral Daily 03/08/24 1826     03/08/24 2200  cefTRIAXone  (ROCEPHIN ) 2 g in sodium chloride  0.9 % 100 mL IVPB        2 g 200 mL/hr over 30 Minutes Intravenous Every 24 hours 03/08/24 1826 03/13/24 0004   03/08/24 1545  ceFEPIme  (MAXIPIME ) 2 g in sodium chloride  0.9 % 100 mL IVPB        2 g 200 mL/hr over 30 Minutes Intravenous  Once 03/08/24 1536 03/08/24 1650   03/08/24 1545  vancomycin  (VANCOREADY) IVPB 1500 mg/300 mL        1,500 mg 150 mL/hr over 120 Minutes Intravenous  Once 03/08/24 1536 03/08/24 2025   03/08/24 1515  cefTRIAXone  (ROCEPHIN ) 1 g in sodium chloride  0.9 % 100 mL IVPB  Status:  Discontinued        1 g 200 mL/hr over 30 Minutes Intravenous  Once 03/08/24 1508 03/08/24 1524   03/08/24 1515  azithromycin  (ZITHROMAX ) tablet 500 mg        500 mg Oral  Once 03/08/24 1508 03/08/24 1621       Subjective: Patient was seen and examined at bedside. Overnight events noted. Patient seems much improved.  She denies any concerns.  Objective: Vitals:   03/13/24 0400 03/13/24 0610 03/13/24 0749 03/13/24 1420  BP: (!) 163/99 (!) 155/96 (!) 147/91 128/87  Pulse: 81 89 87 72  Resp: 15 18 16 15   Temp: 97.8 F (36.6 C)  98.8 F (37.1 C) 97.6 F (36.4 C)  TempSrc:      SpO2: 94% 99% 96% 97%  Weight:      Height:        Intake/Output Summary (Last 24 hours) at 03/13/2024 1545 Last data filed at 03/13/2024 1203 Gross per 24 hour  Intake 560 ml  Output 825 ml  Net -265 ml   Filed Weights   03/08/24 2223  Weight: 63.4 kg    Examination:  General exam: Appears calm and comfortable, deconditioned, not in any acute distress Respiratory system: CTA Bilaterally .  Respiratory effort normal.  RR 15 Cardiovascular system: S1 & S2 heard, RRR. No JVD, murmurs, rubs, gallops or clicks.  Gastrointestinal system: Abdomen is non distended, soft and non tender.  Normal bowel sounds heard. Central nervous system: Alert and oriented x 3. No focal neurological deficits. Extremities: No edema, no cyanosis, no clubbing. Skin: No rashes, lesions or ulcers Psychiatry: Judgement and insight appear normal. Mood & affect appropriate.     Data Reviewed: I have personally reviewed following labs and imaging studies  CBC: Recent Labs  Lab 03/08/24 1418 03/09/24 0315 03/10/24 0250 03/11/24 0313 03/12/24 0323  WBC 7.5 10.0 6.6 5.4 8.6  NEUTROABS 5.1  --   --   --   --   HGB 12.7 13.4 13.2 12.8 13.0  HCT 38.7 40.6 39.6 37.6 38.2  MCV 96.5 95.1 94.1 92.2 93.4  PLT 257  242 278 302 287   Basic Metabolic Panel: Recent Labs  Lab 03/08/24 1418 03/09/24 0315 03/10/24 0250 03/11/24 0313 03/12/24 0323  NA 139 136 139 136 136  K 3.6 3.4* 3.4* 3.6 3.4*  CL 104 103 103 101 102  CO2 25 22 24 23 23   GLUCOSE 120* 91 149* 193* 173*  BUN 18 17 16 22 22   CREATININE 0.82 0.77 0.68 0.59 0.66  CALCIUM  8.2* 8.0* 8.5* 8.4* 8.4*   GFR: Estimated Creatinine Clearance: 36.1 mL/min (by C-G formula based on SCr of 0.66 mg/dL). Liver Function Tests: Recent Labs  Lab 03/08/24 1418 03/09/24 0315 03/10/24 0250 03/11/24 0313 03/12/24 0323  AST 37 34 36 25 26  ALT 38 36 37 31 32  ALKPHOS 75 72 74 71 70  BILITOT 0.5 0.3 <0.2 <0.2 0.2  PROT 6.3* 6.0* 6.3* 6.1* 6.0*  ALBUMIN  2.9* 2.8* 2.8* 2.7* 2.9*   No results for input(s): LIPASE, AMYLASE in the last 168 hours. No results for input(s): AMMONIA in the last 168 hours. Coagulation Profile: Recent Labs  Lab 03/08/24 1418  INR 1.2   Cardiac Enzymes: No results for input(s): CKTOTAL, CKMB, CKMBINDEX, TROPONINI in the last 168 hours. BNP (last 3 results) Recent Labs    05/03/23 1602 03/08/24 2039   PROBNP 3,248* 6,531.0*   HbA1C: No results for input(s): HGBA1C in the last 72 hours. CBG: Recent Labs  Lab 03/12/24 0640 03/12/24 1324 03/12/24 1621 03/13/24 0823 03/13/24 1153  GLUCAP 167* 144* 128* 159* 211*   Lipid Profile: No results for input(s): CHOL, HDL, LDLCALC, TRIG, CHOLHDL, LDLDIRECT in the last 72 hours. Thyroid  Function Tests: No results for input(s): TSH, T4TOTAL, FREET4, T3FREE, THYROIDAB in the last 72 hours. Anemia Panel: No results for input(s): VITAMINB12, FOLATE, FERRITIN, TIBC, IRON, RETICCTPCT in the last 72 hours. Sepsis Labs: Recent Labs  Lab 03/08/24 1518 03/08/24 2039  PROCALCITON  --  <0.10  LATICACIDVEN 1.6  --     Recent Results (from the past 240 hours)  Blood Culture (routine x 2)     Status: None   Collection Time: 03/08/24  2:18 PM   Specimen: BLOOD  Result Value Ref Range Status   Specimen Description BLOOD LEFT ANTECUBITAL  Final   Special Requests   Final    BOTTLES DRAWN AEROBIC AND ANAEROBIC Blood Culture adequate volume   Culture   Final    NO GROWTH 5 DAYS Performed at [facility] Lab, [address] [place], [place] [address]    Report Status 03/13/2024 FINAL  Final  Blood Culture (routine x 2)     Status: None   Collection Time: 03/08/24  2:23 PM   Specimen: BLOOD  Result Value Ref Range Status   Specimen Description BLOOD RIGHT ANTECUBITAL  Final   Special Requests   Final    BOTTLES DRAWN AEROBIC AND ANAEROBIC Blood Culture adequate volume   Culture   Final    NO GROWTH 5 DAYS Performed at [facility], [address] [place], [place] [address]    Report Status 03/13/2024 FINAL  Final  Resp panel by RT-PCR (RSV, Flu A&B, Covid) Anterior Nasal Swab     Status: Abnormal   Collection Time: 03/08/24  3:09 PM   Specimen: Anterior Nasal Swab  Result Value Ref Range Status   SARS Coronavirus 2 by RT PCR POSITIVE (A) NEGATIVE Final   Influenza A by PCR NEGATIVE NEGATIVE  Final   Influenza B by PCR NEGATIVE NEGATIVE Final  Comment: (NOTE) The Xpert Xpress SARS-CoV-2/FLU/RSV plus assay is intended as an aid in the diagnosis of influenza from Nasopharyngeal swab specimens and should not be used as a sole basis for treatment. Nasal washings and aspirates are unacceptable for Xpert Xpress SARS-CoV-2/FLU/RSV testing.  Fact Sheet for Patients: bloggercourse.com  Fact Sheet for Healthcare Providers: seriousbroker.it  This test is not yet approved or cleared by the [place]  FDA and has been authorized for detection and/or diagnosis of SARS-CoV-2 by FDA under an Emergency Use Authorization (EUA). This EUA will remain in effect (meaning this test can be used) for the duration of the COVID-19 declaration under Section 564(b)(1) of the Act, 21 U.S.C. section 360bbb-3(b)(1), unless the authorization is terminated or revoked.     Resp Syncytial Virus by PCR NEGATIVE NEGATIVE Final    Comment: (NOTE) Fact Sheet for Patients: bloggercourse.com  Fact Sheet for Healthcare Providers: seriousbroker.it  This test is not yet approved or cleared by the [place]  FDA and has been authorized for detection and/or diagnosis of SARS-CoV-2 by FDA under an Emergency Use Authorization (EUA). This EUA will remain in effect (meaning this test can be used) for the duration of the COVID-19 declaration under Section 564(b)(1) of the Act, 21 U.S.C. section 360bbb-3(b)(1), unless the authorization is terminated or revoked.  Performed at [facility], [address] [place], [place] [address]   MRSA culture     Status: None   Collection Time: 03/08/24  3:26 PM   Specimen: Nasal Mucosa; Body Fluid  Result Value Ref Range Status   Specimen Description NASOPHARYNGEAL  Final   Special Requests NONE  Final   Culture   Final    NO MRSA DETECTED Performed at [facility], [address], [place], [place] [address]    Report Status 03/10/2024 FINAL  Final  MRSA Next Gen by PCR, Nasal     Status: None   Collection Time: 03/08/24  3:37 PM   Specimen: Nasal Mucosa; Nasal Swab  Result Value Ref Range Status   MRSA by PCR Next Gen NOT DETECTED NOT DETECTED Final    Comment: (NOTE) The GeneXpert MRSA Assay (FDA approved for NASAL specimens only), is one component of a comprehensive MRSA colonization surveillance program. It is not intended to diagnose MRSA infection nor to guide or monitor treatment for MRSA infections. Test performance is not FDA approved in patients less than 71 years old. Performed at [facility], [address], [place], [place] [address]    Radiology Studies: No results found.  Scheduled Meds:  apixaban   2.5 mg Oral BID   azithromycin   500 mg Oral Daily   colchicine   0.6 mg Oral Daily   dexamethasone   6 mg Oral Q24H   feeding supplement  237 mL Oral BID BM   gabapentin   200 mg Oral BID   hydrALAZINE   50 mg Oral Q8H   insulin  aspart  0-9 Units Subcutaneous TID WC   levothyroxine   88 mcg Oral QAC breakfast   metoprolol  succinate  25 mg Oral QPC supper   pantoprazole   40 mg Oral Daily   pramipexole   1 mg Oral QPM   pravastatin   20 mg Oral Daily   sodium chloride  flush  3 mL Intravenous Q12H   Continuous Infusions:     LOS: 5 days    Time spent: 35 mins    Darcel Dawley, MD [facility]   If 7PM-7AM, please contact night-coverage  "

## 2024-03-13 NOTE — Progress Notes (Signed)
 Physical Therapy Treatment Patient Details Name: Dana Bradford MRN: [id number] DOB: 1930/05/16 Today's Date: 03/13/2024   History of Present Illness Pt is a 89 y.o. female presenting 1/16 from [facility] SNF with fever, SOB, AMS. Recently admitted 1/8-1/13 with workup for UTI, COVID. PMH: HTN, HLD, DM, GERD, afib, hypothyroidism, CKD IIIb, CVA, chronic CHF, macular degeneration, glaucoma, gout, anemia, breast cancer status postlumpectomy, venous insufficiency, venous insufficiency ulcer, RLS, recurrent UTI    PT Comments  Pt admitted with above diagnosis. Pt was able to tolerate standing multiple x today with min assist of 2 once pt on STedy and mod assist of 2 from lower surfaces.  Will continue to follow acutely.  Pt currently with functional limitations due to the deficits listed below (see PT Problem List). Pt will benefit from acute skilled PT to increase their independence and safety with mobility to allow discharge.       If plan is discharge home, recommend the following: Two people to help with walking and/or transfers;A lot of help with bathing/dressing/bathroom;Direct supervision/assist for financial management;Direct supervision/assist for medications management;Assistance with cooking/housework;Assist for transportation;Help with stairs or ramp for entrance;Supervision due to cognitive status   Can travel by private vehicle        Equipment Recommendations  Other (comment) (defer to next level of care)    Recommendations for Other Services OT consult     Precautions / Restrictions Precautions Precautions: Fall Precaution/Restrictions Comments: fear of falling increasing fall likelihood Restrictions Weight Bearing Restrictions Per Provider Order: No     Mobility  Bed Mobility Overal bed mobility: Needs Assistance Bed Mobility: Supine to Sit     Supine to sit: Min assist     General bed mobility comments: with increased cuing pt is able to bring herself to EoB     Transfers Overall transfer level: Needs assistance Equipment used: Ambulation equipment used Transfers: Sit to/from Stand, Bed to chair/wheelchair/BSC Sit to Stand: Mod assist, +2 physical assistance, From elevated surface, Via lift equipment           General transfer comment: pt requires modAx2 for bringing feet to floor due to 4'10 stature, modAx2 for steadying in standing, once pt in [place], stands min to CGA, pt requires total assist for pericare.  Pt following commands better today. used STedy to move pt to commode and then back to recliner. Transfer via Lift Equipment: Stedy  Ambulation/Gait                   Stairs             Wheelchair Mobility     Tilt Bed    Modified Rankin (Stroke Patients Only)       Balance Overall balance assessment: Needs assistance Sitting-balance support: No upper extremity supported, Feet supported Sitting balance-Leahy Scale: Fair     Standing balance support: Bilateral upper extremity supported, During functional activity, Reliant on assistive device for balance Standing balance-Leahy Scale: Poor Standing balance comment: stand with STedy wiht UE support with min assist of 2 for safety                            Communication Communication Communication: Impaired Factors Affecting Communication: Hearing impaired  Cognition Arousal: Alert Behavior During Therapy: WFL for tasks assessed/performed   PT - Cognitive impairments: Orientation, Problem solving, Sequencing, Memory  PT - Cognition Comments: knows she is in hosptial but not which city forgets that she was at [facility] Following commands: Impaired      Cueing Cueing Techniques: Verbal cues, Tactile cues, Visual cues  Exercises General Exercises - Lower Extremity Long Arc Quad: AROM, 10 reps, Seated    General Comments General comments (skin integrity, edema, etc.): VSS with O2      Pertinent Vitals/Pain  Pain Assessment Pain Assessment: Faces Faces Pain Scale: Hurts little more Pain Location: all over Pain Descriptors / Indicators: Aching, Discomfort, Grimacing, Guarding Pain Intervention(s): Limited activity within patient's tolerance, Monitored during session, Repositioned    Home Living                          Prior Function            PT Goals (current goals can now be found in the care plan section) Acute Rehab PT Goals Patient Stated Goal: return home - family would like to pursue rehab to improve strength prior to return home alone Progress towards PT goals: Progressing toward goals    Frequency    Min 2X/week      PT Plan      Co-evaluation              AM-PAC PT 6 Clicks Mobility   Outcome Measure  Help needed turning from your back to your side while in a flat bed without using bedrails?: A Little Help needed moving from lying on your back to sitting on the side of a flat bed without using bedrails?: A Little Help needed moving to and from a bed to a chair (including a wheelchair)?: Total Help needed standing up from a chair using your arms (e.g., wheelchair or bedside chair)?: Total Help needed to walk in hospital room?: Total Help needed climbing 3-5 steps with a railing? : Total 6 Click Score: 10    End of Session Equipment Utilized During Treatment: Gait belt;Oxygen Activity Tolerance: Patient tolerated treatment well Patient left: with call bell/phone within reach;in chair;with chair alarm set Nurse Communication: Mobility status PT Visit Diagnosis: Unsteadiness on feet (R26.81);Muscle weakness (generalized) (M62.81);Difficulty in walking, not elsewhere classified (R26.2);History of falling (Z91.81);Repeated falls (R29.6)     Time: 1210-1240 PT Time Calculation (min) (ACUTE ONLY): 30 min  Charges:    $Therapeutic Exercise: 8-22 mins $Therapeutic Activity: 8-22 mins PT General Charges $$ ACUTE PT VISIT: 1 Visit                      Dana Bradford M,PT Acute Rehab Services [phone]    Dana Bradford Bevel 03/13/2024, 2:38 PM

## 2024-03-13 NOTE — Plan of Care (Signed)
 Patient is calm and coping well.  Problem: Education: Goal: Knowledge of risk factors and measures for prevention of condition will improve Outcome: Progressing   Problem: Respiratory: Goal: Will maintain a patent airway Outcome: Progressing   Problem: Respiratory: Goal: Complications related to the disease process, condition or treatment will be avoided or minimized Outcome: Progressing   Problem: Coping: Goal: Ability to adjust to condition or change in health will improve Outcome: Progressing   Problem: Fluid Volume: Goal: Ability to maintain a balanced intake and output will improve Outcome: Progressing   Problem: Health Behavior/Discharge Planning: Goal: Ability to manage health-related needs will improve Outcome: Progressing   Problem: Nutritional: Goal: Maintenance of adequate nutrition will improve Outcome: Progressing   Problem: Skin Integrity: Goal: Risk for impaired skin integrity will decrease Outcome: Progressing   Problem: Tissue Perfusion: Goal: Adequacy of tissue perfusion will improve Outcome: Progressing   Problem: Clinical Measurements: Goal: Respiratory complications will improve Outcome: Progressing

## 2024-03-13 NOTE — TOC Progression Note (Signed)
 Transition of Care [facility]) - Progression Note    Patient Details  Name: Dana Bradford MRN: [id number] Date of Birth: 11-Aug-1930  Transition of [facility]) CM/SW Contact  Bridget Cordella Simmonds, LCSW Phone Number: 03/13/2024, 10:29 AM  Clinical Narrative:   CSW confirmed with [facility]: they are expecting pt back at discharge to [facility].  Medicare payer with inpt order 03/08/24.  Per MD, no DC today.                       Expected Discharge Plan and Services                                               Social Drivers of Health (SDOH) Interventions SDOH Screenings   Food Insecurity: No Food Insecurity (03/08/2024)  Housing: Low Risk (03/08/2024)  Transportation Needs: No Transportation Needs (03/08/2024)  Utilities: Not At Risk (03/08/2024)  Alcohol  Screen: Low Risk (03/11/2024)  Financial Resource Strain: Patient Declined (06/25/2023)  Physical Activity: Unknown (06/25/2023)  Social Connections: Socially Isolated (03/08/2024)  Stress: Patient Declined (06/25/2023)  Tobacco Use: Medium Risk (03/09/2024)    Readmission Risk Interventions    03/03/2024    3:17 PM  Readmission Risk Prevention Plan  Transportation Screening Complete  PCP or Specialist Appt within 5-7 Days Complete  Home Care Screening Complete  Medication Review (RN CM) Complete

## 2024-03-14 DIAGNOSIS — J9601 Acute respiratory failure with hypoxia: Secondary | ICD-10-CM | POA: Diagnosis not present

## 2024-03-14 LAB — GLUCOSE, CAPILLARY
Glucose-Capillary: 152 mg/dL — ABNORMAL HIGH (ref 70–99)
Glucose-Capillary: 111 mg/dL — ABNORMAL HIGH (ref 70–99)
Glucose-Capillary: 109 mg/dL — ABNORMAL HIGH (ref 70–99)
Glucose-Capillary: 137 mg/dL — ABNORMAL HIGH (ref 70–99)
Glucose-Capillary: 149 mg/dL — ABNORMAL HIGH (ref 70–99)

## 2024-03-14 NOTE — Progress Notes (Signed)
 Patients son Maude would like to be called in the morning with update on discharge to SNF. Zulema number is [phone].

## 2024-03-14 NOTE — Progress Notes (Signed)
 " PROGRESS NOTE    Dana Bradford  FMW:[id number] DOB: 1930-03-29 DOA: 03/08/2024 PCP: Almarie Waddell NOVAK, NP   Brief Narrative:  This 89 yrs old female with medical history significant of hypertension, hyperlipidemia, diabetes, GERD, atrial fibrillation, hypothyroidism, CKD 3B, CVA, chronic CHF, macular degeneration, glaucoma, gout, anemia, breast cancer status postlumpectomy, venous insufficiency, venous insufficiency ulcer, RLS, recurrent UTI presenting with fever, shortness of breath, altered mental status.  Patient had recently tested positive for COVID-19 (admitted 1/8 to 1/13) and had been discharged to a skilled nursing facility.  Patient developed fever, shortness of breath at the facility and presented again to hospital.  Diagnosed with pneumonia and started on antibiotics.    Assessment & Plan:   Principal Problem:   Acute respiratory failure with hypoxia (HCC) Active Problems:   Restless leg syndrome   Essential hypertension, benign   GERD (gastroesophageal reflux disease)   Hypothyroidism   Hyperlipidemia   CHF (congestive heart failure) (HCC)   History of CVA (cerebrovascular accident)   Glaucoma   Permanent atrial fibrillation (HCC)   Controlled type 2 diabetes mellitus with hyperglycemia, without long-term current use of insulin  (HCC)   Stage 3b chronic kidney disease (HCC)   Malignant neoplasm of overlapping sites of left female breast (HCC)   COVID-19   Gout  Acute hypoxic respiratory failure: Likely due to pneumonia. No baseline oxygen use.  Continue supplemental oxygen.    Pneumonia.  COVID-19 infection.  She presented with fever, shortness of breath. Patient had tested positive for COVID-19 on 02/29/2024.  Admitted here from 1/8 to 1/13.  She was not hypoxic and did not receive antivirals, steroids or antibiotics during that admission. Patient now presenting with hypoxia 8 days after initial COVID-19 diagnosis. Pro-calcitonin is not elevated.  Patient may have a  viral pneumonia from COVID-19 or a bacterial pneumonia or a combination.  Given hypoxia, patient was started on remdesivir  and dexamethasone .  As bacterial pneumonia cannot be ruled out, continue antibiotics.    CHF (HFpEF): Last TTE with EF 55-60% Patient presented with elevated proBNP but no signs of volume overload. Will assess volume status daily and diurese as needed.   Hypertension: Continue with metoprolol  as patient went into RVR the last time this was held. Hold off on diuresis for now. Continue hydralazine  25 mg every 8 hours for better blood pressure control.   Hyperlipidemia: Continue home pravastatin .   Diabetes Mellitus II: Continue SSI.   GERD: Continue home PPI.   Atrial fibrillation: Continue home metoprolol  and Eliquis . HR well controlled. Can consider IV metoprolol  2.5 mg if patient goes into RVR.    History of CVA: Continue home statin, Eliquis .   Gout: Treated for flare with prednisone  during last admission. Continue daily colchicine .   Anemia: Hemoglobin normal.    History of breast cancer: Status post lobectomy Continue outpatient follow-up   Venous insufficiency: Venous insufficiency ulcer: Noted.   RLS: Continue home gabapentin  and pramipexole .     DVT prophylaxis:Eliquis   Code Status: Full code Family Communication: No family at bed side Disposition Plan:    Status is: Inpatient Remains inpatient appropriate because: Patient admitted for pneumonia in the setting of COVID.  Started on IV antibiotics and IV antivirals.  Patient is still remains on supplemental oxygen.  Patient is now medically ready for discharge.    Consultants:  None  Procedures: None  Antimicrobials: Anti-infectives (From admission, onward)    Start     Dose/Rate Route Frequency Ordered Stop   03/10/24 1000  remdesivir   100 mg in sodium chloride  0.9 % 100 mL IVPB       Placed in Followed by Linked Group   100 mg 200 mL/hr over 30 Minutes Intravenous  Daily 03/09/24 1516 03/11/24 1008   03/09/24 1615  remdesivir  200 mg in sodium chloride  0.9% 250 mL IVPB       Placed in Followed by Linked Group   200 mg 580 mL/hr over 30 Minutes Intravenous Once 03/09/24 1516 03/09/24 1728   03/09/24 1500  azithromycin  (ZITHROMAX ) tablet 500 mg  Status:  Discontinued        500 mg Oral Daily 03/08/24 1826 03/14/24 1336   03/08/24 2200  cefTRIAXone  (ROCEPHIN ) 2 g in sodium chloride  0.9 % 100 mL IVPB        2 g 200 mL/hr over 30 Minutes Intravenous Every 24 hours 03/08/24 1826 03/13/24 0004   03/08/24 1545  ceFEPIme  (MAXIPIME ) 2 g in sodium chloride  0.9 % 100 mL IVPB        2 g 200 mL/hr over 30 Minutes Intravenous  Once 03/08/24 1536 03/08/24 1650   03/08/24 1545  vancomycin  (VANCOREADY) IVPB 1500 mg/300 mL        1,500 mg 150 mL/hr over 120 Minutes Intravenous  Once 03/08/24 1536 03/08/24 2025   03/08/24 1515  cefTRIAXone  (ROCEPHIN ) 1 g in sodium chloride  0.9 % 100 mL IVPB  Status:  Discontinued        1 g 200 mL/hr over 30 Minutes Intravenous  Once 03/08/24 1508 03/08/24 1524   03/08/24 1515  azithromycin  (ZITHROMAX ) tablet 500 mg        500 mg Oral  Once 03/08/24 1508 03/08/24 1621       Subjective: Patient was seen and examined at bedside. Overnight events noted. Patient seems much improved.  Her cheeks seems puffy. Could be due to steroids, She denies any other concerns.  Objective: Vitals:   03/13/24 1420 03/13/24 1950 03/14/24 0414 03/14/24 0758  BP: 128/87 120/75 (!) 145/94 129/73  Pulse: 72 81 78 80  Resp: 15 16 19 15   Temp: 97.6 F (36.4 C) 97.7 F (36.5 C) (!) 97.3 F (36.3 C) 97.9 F (36.6 C)  TempSrc:  Oral Oral   SpO2: 97% 97% 98% 97%  Weight:      Height:        Intake/Output Summary (Last 24 hours) at 03/14/2024 1340 Last data filed at 03/14/2024 9171 Gross per 24 hour  Intake 480 ml  Output 850 ml  Net -370 ml   Filed Weights   03/08/24 2223  Weight: 63.4 kg    Examination:  General exam: Appears calm and  comfortable, deconditioned, not in any acute distress Respiratory system: CTA Bilaterally. Respiratory effort normal.  RR 15 Cardiovascular system: S1 & S2 heard, RRR. No JVD, murmurs, rubs, gallops or clicks.  Gastrointestinal system: Abdomen is non distended, soft and non tender.  Normal bowel sounds heard. Central nervous system: Alert and oriented x 3. No focal neurological deficits. Extremities: No edema, no cyanosis, no clubbing. Skin: No rashes, lesions or ulcers Psychiatry: Judgement and insight appear normal. Mood & affect appropriate.     Data Reviewed: I have personally reviewed following labs and imaging studies  CBC: Recent Labs  Lab 03/08/24 1418 03/09/24 0315 03/10/24 0250 03/11/24 0313 03/12/24 0323  WBC 7.5 10.0 6.6 5.4 8.6  NEUTROABS 5.1  --   --   --   --   HGB 12.7 13.4 13.2 12.8 13.0  HCT 38.7 40.6 39.6  37.6 38.2  MCV 96.5 95.1 94.1 92.2 93.4  PLT 257 242 278 302 287   Basic Metabolic Panel: Recent Labs  Lab 03/08/24 1418 03/09/24 0315 03/10/24 0250 03/11/24 0313 03/12/24 0323  NA 139 136 139 136 136  K 3.6 3.4* 3.4* 3.6 3.4*  CL 104 103 103 101 102  CO2 25 22 24 23 23   GLUCOSE 120* 91 149* 806* 173*  BUN 18 17 16 22 22   CREATININE 0.82 0.77 0.68 0.59 0.66  CALCIUM  8.2* 8.0* 8.5* 8.4* 8.4*   GFR: Estimated Creatinine Clearance: 36.1 mL/min (by C-G formula based on SCr of 0.66 mg/dL). Liver Function Tests: Recent Labs  Lab 03/08/24 1418 03/09/24 0315 03/10/24 0250 03/11/24 0313 03/12/24 0323  AST 37 34 36 25 26  ALT 38 36 37 31 32  ALKPHOS 75 72 74 71 70  BILITOT 0.5 0.3 <0.2 <0.2 0.2  PROT 6.3* 6.0* 6.3* 6.1* 6.0*  ALBUMIN  2.9* 2.8* 2.8* 2.7* 2.9*   No results for input(s): LIPASE, AMYLASE in the last 168 hours. No results for input(s): AMMONIA in the last 168 hours. Coagulation Profile: Recent Labs  Lab 03/08/24 1418  INR 1.2   Cardiac Enzymes: No results for input(s): CKTOTAL, CKMB, CKMBINDEX, TROPONINI in the  last 168 hours. BNP (last 3 results) Recent Labs    05/03/23 1602 03/08/24 2039  PROBNP 3,248* 6,531.0*   HbA1C: No results for input(s): HGBA1C in the last 72 hours. CBG: Recent Labs  Lab 03/13/24 1635 03/13/24 2142 03/14/24 0645 03/14/24 0759 03/14/24 1121  GLUCAP 77 196* 152* 111* 109*   Lipid Profile: No results for input(s): CHOL, HDL, LDLCALC, TRIG, CHOLHDL, LDLDIRECT in the last 72 hours. Thyroid  Function Tests: No results for input(s): TSH, T4TOTAL, FREET4, T3FREE, THYROIDAB in the last 72 hours. Anemia Panel: No results for input(s): VITAMINB12, FOLATE, FERRITIN, TIBC, IRON, RETICCTPCT in the last 72 hours. Sepsis Labs: Recent Labs  Lab 03/08/24 1518 03/08/24 2039  PROCALCITON  --  <0.10  LATICACIDVEN 1.6  --     Recent Results (from the past 240 hours)  Blood Culture (routine x 2)     Status: None   Collection Time: 03/08/24  2:18 PM   Specimen: BLOOD  Result Value Ref Range Status   Specimen Description BLOOD LEFT ANTECUBITAL  Final   Special Requests   Final    BOTTLES DRAWN AEROBIC AND ANAEROBIC Blood Culture adequate volume   Culture   Final    NO GROWTH 5 DAYS Performed at [facility], [address], [place], [place] [address]    Report Status 03/13/2024 FINAL  Final  Blood Culture (routine x 2)     Status: None   Collection Time: 03/08/24  2:23 PM   Specimen: BLOOD  Result Value Ref Range Status   Specimen Description BLOOD RIGHT ANTECUBITAL  Final   Special Requests   Final    BOTTLES DRAWN AEROBIC AND ANAEROBIC Blood Culture adequate volume   Culture   Final    NO GROWTH 5 DAYS Performed at [facility], [address], [place], [place] [address]    Report Status 03/13/2024 FINAL  Final  Resp panel by RT-PCR (RSV, Flu A&B, Covid) Anterior Nasal Swab     Status: Abnormal   Collection Time: 03/08/24  3:09 PM   Specimen: Anterior Nasal Swab  Result Value Ref Range Status   SARS Coronavirus  2 by RT PCR POSITIVE (A) NEGATIVE Final   Influenza A by PCR NEGATIVE  NEGATIVE Final   Influenza B by PCR NEGATIVE NEGATIVE Final    Comment: (NOTE) The Xpert Xpress SARS-CoV-2/FLU/RSV plus assay is intended as an aid in the diagnosis of influenza from Nasopharyngeal swab specimens and should not be used as a sole basis for treatment. Nasal washings and aspirates are unacceptable for Xpert Xpress SARS-CoV-2/FLU/RSV testing.  Fact Sheet for Patients: bloggercourse.com  Fact Sheet for Healthcare Providers: seriousbroker.it  This test is not yet approved or cleared by the [place]  FDA and has been authorized for detection and/or diagnosis of SARS-CoV-2 by FDA under an Emergency Use Authorization (EUA). This EUA will remain in effect (meaning this test can be used) for the duration of the COVID-19 declaration under Section 564(b)(1) of the Act, 21 U.S.C. section 360bbb-3(b)(1), unless the authorization is terminated or revoked.     Resp Syncytial Virus by PCR NEGATIVE NEGATIVE Final    Comment: (NOTE) Fact Sheet for Patients: bloggercourse.com  Fact Sheet for Healthcare Providers: seriousbroker.it  This test is not yet approved or cleared by the [place]  FDA and has been authorized for detection and/or diagnosis of SARS-CoV-2 by FDA under an Emergency Use Authorization (EUA). This EUA will remain in effect (meaning this test can be used) for the duration of the COVID-19 declaration under Section 564(b)(1) of the Act, 21 U.S.C. section 360bbb-3(b)(1), unless the authorization is terminated or revoked.  Performed at [facility], [address] [place], [place] [address]   MRSA culture     Status: None   Collection Time: 03/08/24  3:26 PM   Specimen: Nasal Mucosa; Body Fluid  Result Value Ref Range Status   Specimen Description NASOPHARYNGEAL  Final   Special  Requests NONE  Final   Culture   Final    NO MRSA DETECTED Performed at [facility], [address], [place], [place] [address]    Report Status 03/10/2024 FINAL  Final  MRSA Next Gen by PCR, Nasal     Status: None   Collection Time: 03/08/24  3:37 PM   Specimen: Nasal Mucosa; Nasal Swab  Result Value Ref Range Status   MRSA by PCR Next Gen NOT DETECTED NOT DETECTED Final    Comment: (NOTE) The GeneXpert MRSA Assay (FDA approved for NASAL specimens only), is one component of a comprehensive MRSA colonization surveillance program. It is not intended to diagnose MRSA infection nor to guide or monitor treatment for MRSA infections. Test performance is not FDA approved in patients less than 5 years old. Performed at [facility], [address], [place], [place] [address]    Radiology Studies: No results found.  Scheduled Meds:  apixaban   2.5 mg Oral BID   colchicine   0.6 mg Oral Daily   dexamethasone   6 mg Oral Q24H   feeding supplement  237 mL Oral BID BM   gabapentin   200 mg Oral BID   hydrALAZINE   50 mg Oral Q8H   insulin  aspart  0-9 Units Subcutaneous TID WC   levothyroxine   88 mcg Oral QAC breakfast   metoprolol  succinate  25 mg Oral QPC supper   pantoprazole   40 mg Oral Daily   pramipexole   1 mg Oral QPM   pravastatin   20 mg Oral Daily   sodium chloride  flush  3 mL Intravenous Q12H   Continuous Infusions:     LOS: 6 days    Time spent: 35 mins    Darcel Dawley, MD [facility]   If 7PM-7AM, please contact night-coverage  "

## 2024-03-14 NOTE — Plan of Care (Signed)
  Problem: Education: Goal: Knowledge of risk factors and measures for prevention of condition will improve Outcome: Adequate for Discharge   Problem: Coping: Goal: Psychosocial and spiritual needs will be supported Outcome: Adequate for Discharge   Problem: Respiratory: Goal: Will maintain a patent airway Outcome: Adequate for Discharge Goal: Complications related to the disease process, condition or treatment will be avoided or minimized Outcome: Adequate for Discharge   Problem: Education: Goal: Ability to describe self-care measures that may prevent or decrease complications (Diabetes Survival Skills Education) will improve Outcome: Adequate for Discharge Goal: Individualized Educational Video(s) Outcome: Adequate for Discharge   Problem: Coping: Goal: Ability to adjust to condition or change in health will improve Outcome: Adequate for Discharge   Problem: Fluid Volume: Goal: Ability to maintain a balanced intake and output will improve Outcome: Adequate for Discharge   Problem: Health Behavior/Discharge Planning: Goal: Ability to identify and utilize available resources and services will improve Outcome: Adequate for Discharge Goal: Ability to manage health-related needs will improve Outcome: Adequate for Discharge   Problem: Metabolic: Goal: Ability to maintain appropriate glucose levels will improve Outcome: Adequate for Discharge   Problem: Nutritional: Goal: Maintenance of adequate nutrition will improve Outcome: Adequate for Discharge Goal: Progress toward achieving an optimal weight will improve Outcome: Adequate for Discharge   Problem: Skin Integrity: Goal: Risk for impaired skin integrity will decrease Outcome: Adequate for Discharge   Problem: Tissue Perfusion: Goal: Adequacy of tissue perfusion will improve Outcome: Adequate for Discharge   Problem: Education: Goal: Knowledge of General Education information will improve Description: Including  pain rating scale, medication(s)/side effects and non-pharmacologic comfort measures Outcome: Adequate for Discharge   Problem: Health Behavior/Discharge Planning: Goal: Ability to manage health-related needs will improve Outcome: Adequate for Discharge   Problem: Clinical Measurements: Goal: Ability to maintain clinical measurements within normal limits will improve Outcome: Adequate for Discharge Goal: Will remain free from infection Outcome: Adequate for Discharge Goal: Diagnostic test results will improve Outcome: Adequate for Discharge Goal: Respiratory complications will improve Outcome: Adequate for Discharge Goal: Cardiovascular complication will be avoided Outcome: Adequate for Discharge   Problem: Activity: Goal: Risk for activity intolerance will decrease Outcome: Adequate for Discharge   Problem: Nutrition: Goal: Adequate nutrition will be maintained Outcome: Adequate for Discharge   Problem: Coping: Goal: Level of anxiety will decrease Outcome: Adequate for Discharge   Problem: Elimination: Goal: Will not experience complications related to bowel motility Outcome: Adequate for Discharge Goal: Will not experience complications related to urinary retention Outcome: Adequate for Discharge   Problem: Pain Managment: Goal: General experience of comfort will improve and/or be controlled Outcome: Adequate for Discharge   Problem: Safety: Goal: Ability to remain free from injury will improve Outcome: Adequate for Discharge   Problem: Skin Integrity: Goal: Risk for impaired skin integrity will decrease Outcome: Adequate for Discharge

## 2024-03-14 NOTE — Plan of Care (Signed)
" °  Problem: Respiratory: Goal: Will maintain a patent airway Outcome: Progressing Goal: Complications related to the disease process, condition or treatment will be avoided or minimized Outcome: Progressing   Problem: Education: Goal: Ability to describe self-care measures that may prevent or decrease complications (Diabetes Survival Skills Education) will improve Outcome: Progressing Goal: Individualized Educational Video(s) Outcome: Progressing   Problem: Coping: Goal: Ability to adjust to condition or change in health will improve Outcome: Progressing   Problem: Fluid Volume: Goal: Ability to maintain a balanced intake and output will improve Outcome: Progressing   Problem: Health Behavior/Discharge Planning: Goal: Ability to identify and utilize available resources and services will improve Outcome: Not Progressing Goal: Ability to manage health-related needs will improve Outcome: Not Progressing   Problem: Metabolic: Goal: Ability to maintain appropriate glucose levels will improve Outcome: Progressing   Problem: Nutritional: Goal: Maintenance of adequate nutrition will improve Outcome: Progressing Goal: Progress toward achieving an optimal weight will improve Outcome: Progressing   Problem: Education: Goal: Knowledge of General Education information will improve Description: Including pain rating scale, medication(s)/side effects and non-pharmacologic comfort measures Outcome: Progressing   Problem: Health Behavior/Discharge Planning: Goal: Ability to manage health-related needs will improve Outcome: Progressing   Problem: Clinical Measurements: Goal: Ability to maintain clinical measurements within normal limits will improve Outcome: Progressing Goal: Will remain free from infection Outcome: Progressing Goal: Diagnostic test results will improve Outcome: Progressing Goal: Respiratory complications will improve Outcome: Progressing Goal: Cardiovascular  complication will be avoided Outcome: Progressing   Problem: Activity: Goal: Risk for activity intolerance will decrease Outcome: Progressing   Problem: Nutrition: Goal: Adequate nutrition will be maintained Outcome: Progressing   Problem: Safety: Goal: Ability to remain free from injury will improve Outcome: Progressing   "

## 2024-03-15 ENCOUNTER — Other Ambulatory Visit (HOSPITAL_COMMUNITY): Payer: Self-pay

## 2024-03-15 DIAGNOSIS — J9601 Acute respiratory failure with hypoxia: Secondary | ICD-10-CM | POA: Diagnosis not present

## 2024-03-15 LAB — GLUCOSE, CAPILLARY
Glucose-Capillary: 112 mg/dL — ABNORMAL HIGH (ref 70–99)
Glucose-Capillary: 156 mg/dL — ABNORMAL HIGH (ref 70–99)
Glucose-Capillary: 145 mg/dL — ABNORMAL HIGH (ref 70–99)
Glucose-Capillary: 182 mg/dL — ABNORMAL HIGH (ref 70–99)

## 2024-03-15 MED ORDER — HYDRALAZINE HCL 50 MG PO TABS
50.0000 mg | ORAL_TABLET | Freq: Three times a day (TID) | ORAL | 0 refills | Status: AC
  Filled 2024-03-15: qty 90, 30d supply, fill #0

## 2024-03-15 MED ORDER — GERHARDT'S BUTT CREAM
TOPICAL_CREAM | Freq: Three times a day (TID) | CUTANEOUS | Status: DC
  Filled 2024-03-15: qty 60

## 2024-03-15 MED ORDER — DEXAMETHASONE 6 MG PO TABS
6.0000 mg | ORAL_TABLET | ORAL | 0 refills | Status: AC
  Filled 2024-03-15: qty 2, 2d supply, fill #0

## 2024-03-15 NOTE — Plan of Care (Signed)

## 2024-03-15 NOTE — Plan of Care (Signed)
" °  Problem: Acute Rehab PT Goals(only PT should resolve) Goal: Patient Will Transfer Sit To/From Stand Outcome: Adequate for Discharge Goal: Pt Will Transfer Bed To Chair/Chair To Bed Outcome: Adequate for Discharge Goal: Pt Will Ambulate Outcome: Adequate for Discharge   Problem: Acute Rehab PT Goals(only PT should resolve) Goal: Pt Will Go Sit To Supine/Side Outcome: Adequate for Discharge   Problem: Acute Rehab OT Goals (only OT should resolve) Goal: Pt. Will Perform Grooming Outcome: Adequate for Discharge Goal: Pt. Will Perform Lower Body Dressing Outcome: Adequate for Discharge Goal: Pt. Will Transfer To Toilet Outcome: Adequate for Discharge Goal: Pt. Will Perform Toileting-Clothing Manipulation Outcome: Adequate for Discharge   Problem: Education: Goal: Knowledge of General Education information will improve Description: Including pain rating scale, medication(s)/side effects and non-pharmacologic comfort measures 03/15/2024 1534 by Burnard Almarie BROCKS, RN Outcome: Adequate for Discharge 03/15/2024 9176 by Burnard Almarie BROCKS, RN Outcome: Progressing   Problem: Health Behavior/Discharge Planning: Goal: Ability to manage health-related needs will improve 03/15/2024 1534 by Burnard Almarie BROCKS, RN Outcome: Adequate for Discharge 03/15/2024 9176 by Burnard Almarie BROCKS, RN Outcome: Progressing   Problem: Clinical Measurements: Goal: Ability to maintain clinical measurements within normal limits will improve 03/15/2024 1534 by Burnard Almarie BROCKS, RN Outcome: Adequate for Discharge 03/15/2024 9176 by Burnard Almarie BROCKS, RN Outcome: Progressing Goal: Will remain free from infection 03/15/2024 1534 by Burnard Almarie BROCKS, RN Outcome: Adequate for Discharge 03/15/2024 [phone] by Burnard Almarie BROCKS, RN Outcome: Progressing Goal: Diagnostic test results will improve 03/15/2024 1534 by Burnard Almarie BROCKS, RN Outcome: Adequate for Discharge 03/15/2024 9176 by Burnard Almarie BROCKS,  RN Outcome: Progressing Goal: Respiratory complications will improve 03/15/2024 1534 by Burnard Almarie BROCKS, RN Outcome: Adequate for Discharge 03/15/2024 9176 by Burnard Almarie BROCKS, RN Outcome: Progressing Goal: Cardiovascular complication will be avoided 03/15/2024 1534 by Burnard Almarie BROCKS, RN Outcome: Adequate for Discharge 03/15/2024 9176 by Burnard Almarie BROCKS, RN Outcome: Progressing   Problem: Activity: Goal: Risk for activity intolerance will decrease 03/15/2024 1534 by Burnard Almarie BROCKS, RN Outcome: Adequate for Discharge 03/15/2024 [phone] by Burnard Almarie BROCKS, RN Outcome: Progressing   Problem: Nutrition: Goal: Adequate nutrition will be maintained 03/15/2024 1534 by Burnard Almarie BROCKS, RN Outcome: Adequate for Discharge 03/15/2024 [phone] by Burnard Almarie BROCKS, RN Outcome: Progressing   Problem: Coping: Goal: Level of anxiety will decrease 03/15/2024 1534 by Burnard Almarie BROCKS, RN Outcome: Adequate for Discharge 03/15/2024 9176 by Burnard Almarie BROCKS, RN Outcome: Progressing   Problem: Elimination: Goal: Will not experience complications related to bowel motility 03/15/2024 1534 by Burnard Almarie BROCKS, RN Outcome: Adequate for Discharge 03/15/2024 9176 by Burnard Almarie BROCKS, RN Outcome: Progressing Goal: Will not experience complications related to urinary retention 03/15/2024 1534 by Burnard Almarie BROCKS, RN Outcome: Adequate for Discharge 03/15/2024 9176 by Burnard Almarie BROCKS, RN Outcome: Progressing   Problem: Pain Managment: Goal: General experience of comfort will improve and/or be controlled 03/15/2024 1534 by Burnard Almarie BROCKS, RN Outcome: Adequate for Discharge 03/15/2024 9176 by Burnard Almarie BROCKS, RN Outcome: Progressing   Problem: Safety: Goal: Ability to remain free from injury will improve 03/15/2024 1534 by Burnard Almarie BROCKS, RN Outcome: Adequate for Discharge 03/15/2024 9176 by Burnard Almarie BROCKS, RN Outcome: Progressing   Problem: Skin Integrity: Goal:  Risk for impaired skin integrity will decrease 03/15/2024 1534 by Burnard Almarie BROCKS, RN Outcome: Adequate for Discharge 03/15/2024 9176 by Burnard Almarie BROCKS, RN Outcome: Progressing   "

## 2024-03-15 NOTE — Discharge Instructions (Signed)
 Advised to take dexamethasone  6 mg for 2 more days.

## 2024-03-15 NOTE — Plan of Care (Signed)
" °  Problem: Education: Goal: Knowledge of General Education information will improve Description: Including pain rating scale, medication(s)/side effects and non-pharmacologic comfort measures Outcome: Progressing   Problem: Health Behavior/Discharge Planning: Goal: Ability to manage health-related needs will improve Outcome: Not Progressing   Problem: Clinical Measurements: Goal: Ability to maintain clinical measurements within normal limits will improve Outcome: Progressing Goal: Will remain free from infection Outcome: Progressing Goal: Diagnostic test results will improve Outcome: Progressing Goal: Respiratory complications will improve Outcome: Progressing Goal: Cardiovascular complication will be avoided Outcome: Progressing   Problem: Activity: Goal: Risk for activity intolerance will decrease Outcome: Progressing   Problem: Coping: Goal: Level of anxiety will decrease Outcome: Progressing   Problem: Elimination: Goal: Will not experience complications related to bowel motility Outcome: Progressing Goal: Will not experience complications related to urinary retention Outcome: Progressing   Problem: Pain Managment: Goal: General experience of comfort will improve and/or be controlled Outcome: Progressing   Problem: Safety: Goal: Ability to remain free from injury will improve Outcome: Progressing   Problem: Skin Integrity: Goal: Risk for impaired skin integrity will decrease Outcome: Progressing   "

## 2024-03-15 NOTE — Progress Notes (Signed)
 Patient is not leaving to SNF. It was too late today for the facility to accept her. Dana Bradford [phone] would to be called when she is moved. The sister angala hilgers would also like to be called. Patient is going to [facility] on Saturday

## 2024-03-15 NOTE — Discharge Summary (Addendum)
 Physician Discharge Summary  Dana Bradford FMW:[id number] DOB: [birth year] DOA: 03/08/2024  PCP: Almarie Waddell NOVAK, NP  Admit date: 03/08/2024  Discharge date: 03/16/2024  Admitted From: Home  Disposition:  SNF  Recommendations for Outpatient Follow-up:  Follow up with PCP in 1-2 weeks Please obtain BMP/CBC in one week Advised to take dexamethasone  6 mg for 2 more days.  Home Health:None Equipment/Devices:Home oxygen  Discharge Condition: Stable CODE STATUS:DNR Diet recommendation: Heart Healthy  Brief [facility] Course: This 89 yrs old female with medical history significant of hypertension, hyperlipidemia, diabetes, GERD, atrial fibrillation, hypothyroidism, CKD 3B, CVA, chronic CHF, macular degeneration, glaucoma, gout, anemia, breast cancer status postlumpectomy, venous insufficiency, venous insufficiency ulcer, RLS, recurrent UTI presenting with fever, shortness of breath, altered mental status.  Patient had recently tested positive for COVID-19 (admitted 1/8 to 1/13) and had been discharged to a skilled nursing facility.  Patient developed fever, shortness of breath at the facility and presented again to hospital.  Diagnosed with pneumonia and started on antibiotics.  Patient has completed antibiotics course for pneumonia.  She has also completed remdesivir  but continued on dexamethasone .  She seems much improved and was about to be discharged.  She was noticed to have some redness around both cheeks,  denies any itching, burning or any new medications. Cheek redness has completely resolved. She remains on room air.  Discharge Diagnoses:  Principal Problem:   Acute respiratory failure with hypoxia (HCC) Active Problems:   Restless leg syndrome   Essential hypertension, benign   GERD (gastroesophageal reflux disease)   Hypothyroidism   Hyperlipidemia   CHF (congestive heart failure) (HCC)   History of CVA (cerebrovascular accident)   Glaucoma   Permanent atrial fibrillation  (HCC)   Controlled type 2 diabetes mellitus with hyperglycemia, without long-term current use of insulin  (HCC)   Stage 3b chronic kidney disease (HCC)   Malignant neoplasm of overlapping sites of left female breast (HCC)   COVID-19   Gout  Acute hypoxic respiratory failure: Likely due to pneumonia. No baseline oxygen use.  Continue supplemental oxygen.    Pneumonia.  COVID-19 infection.  She presented with fever, shortness of breath. Patient had tested positive for COVID-19 on 02/29/2024.  Admitted here from 1/8 to 1/13.  She was not hypoxic and did not receive antivirals, steroids or antibiotics during that admission. Patient now presenting with hypoxia 8 days after initial COVID-19 diagnosis. Pro-calcitonin is not elevated.  Patient may have a viral pneumonia from COVID-19 or a bacterial pneumonia or a combination.  Given hypoxia, patient was started on remdesivir  and dexamethasone .  As bacterial pneumonia cannot be ruled out, continue antibiotics.  Completed remdesivir  and antibiotics.   CHF (HFpEF): Last TTE with EF 55-60% Patient presented with elevated proBNP but no signs of volume overload. Will assess volume status daily and diurese as needed.   Hypertension: Continue with metoprolol  as patient went into RVR the last time this was held. Hold off on diuresis for now. Continue hydralazine  25 mg every 8 hours for better blood pressure control.   Hyperlipidemia: Continue home pravastatin .   Diabetes Mellitus II: Continue SSI.   GERD: Continue home PPI.   Atrial fibrillation: Continue home metoprolol  and Eliquis . HR well controlled. Can consider IV metoprolol  2.5 mg if patient goes into RVR.    History of CVA: Continue home statin, Eliquis .   Gout: Treated for flare with prednisone  during last admission. Continue daily colchicine .   Anemia: Hemoglobin normal.    History of breast cancer: Status post  lobectomy Continue outpatient follow-up   Venous  insufficiency: Venous insufficiency ulcer: Noted.   RLS: Continue home gabapentin  and pramipexole .  Discharge Instructions  Discharge Instructions     Call MD for:  difficulty breathing, headache or visual disturbances   Complete by: As directed    Call MD for:  persistant nausea and vomiting   Complete by: As directed    Diet general   Complete by: As directed    Discharge instructions   Complete by: As directed    Advised to follow-up with primary care physician in 1 week. Advised to take dexamethasone  6 mg for 2 more days.   Increase activity slowly   Complete by: As directed    No wound care   Complete by: As directed       Allergies as of 03/16/2024       Reactions   Arthrotec [diclofenac -misoprostol] Shortness Of Breath, Diarrhea   Doxycycline Nausea And Vomiting, Rash   Significant enough to d/c treatment and refuse to take more Vomiting (intolerance) The first time she took it she had N/V and the second she had a rash.     Latex Rash, Other (See Comments)   Amlodipine Swelling   angioedema   Celebrex [celecoxib] Other (See Comments)   GI Bleeding   Vancomycin  Other (See Comments)   Acute kidney failure   Diclofenac  Diarrhea   Shellfish Allergy Itching, Rash        Medication List     STOP taking these medications    benzonatate  100 MG capsule Commonly known as: TESSALON    clotrimazole -betamethasone  cream Commonly known as: LOTRISONE    insulin  aspart 100 UNIT/ML injection Commonly known as: novoLOG    nystatin  cream Commonly known as: MYCOSTATIN    predniSONE  20 MG tablet Commonly known as: DELTASONE        TAKE these medications    pramipexole  1 MG tablet Commonly known as: MIRAPEX  Take 1 tablet (1 mg total) by mouth every evening. The timing of this medication is very important.   apixaban  5 MG Tabs tablet Commonly known as: ELIQUIS  Take 1/2 tablet (2.5mg ) by mouth two times daily   ascorbic acid  500 MG tablet Commonly known as:  VITAMIN C Take 1,000 mg by mouth 3 (three) times a week. Monday , Wednesday and Friday   colchicine  0.6 MG tablet Take 1 tablet (0.6 mg total) by mouth daily.   dexamethasone  6 MG tablet Commonly known as: DECADRON  Take 1 tablet (6 mg total) by mouth daily for 2 days.   feeding supplement Liqd Take 237 mLs by mouth 2 (two) times daily between meals.   ferrous sulfate  325 (65 FE) MG tablet Take 325 mg by mouth See admin instructions. By mouth 3 times a week at bedtime. Give with 1000 MG Vitamin C.   furosemide  40 MG tablet Commonly known as: LASIX  Take 1 tablet (40 mg total) by mouth daily as needed. What changed: reasons to take this   gabapentin  100 MG capsule Commonly known as: NEURONTIN  Take 200 mg by mouth 2 (two) times daily.   Gerhardt's butt cream Crea Apply 1 Application topically 3 (three) times daily.   guaiFENesin -dextromethorphan  100-10 MG/5ML syrup Commonly known as: ROBITUSSIN DM Take 5 mLs by mouth every 4 (four) hours as needed for cough.   hydrALAZINE  50 MG tablet Commonly known as: APRESOLINE  Take 1 tablet (50 mg total) by mouth every 8 (eight) hours.   insulin  lispro 100 UNIT/ML injection Commonly known as: HUMALOG  Inject 0-10 Units into the skin  See admin instructions. Per Sliding Scale CBG 0-200- 0 units  201-250 - 2 units  251-300 - 4 units  301-350 - 6 units  351-400 - 8 units  Greater than 400 - 10 units   levothyroxine  88 MCG tablet Commonly known as: SYNTHROID  Take 1 tablet (88 mcg total) by mouth daily before breakfast.   melatonin 5 MG Tabs Take 1 tablet (5 mg total) by mouth at bedtime as needed.   metoprolol  succinate 25 MG 24 hr tablet Commonly known as: TOPROL -XL Take 1 tablet (25 mg total) by mouth daily after supper.   omeprazole  40 MG capsule Commonly known as: PRILOSEC TAKE 1 CAPSULE DAILY   pravastatin  20 MG tablet Commonly known as: PRAVACHOL  Take 1 tablet (20 mg total) by mouth daily.   triamcinolone  ointment 0.1  % Commonly known as: KENALOG  Apply 1 application topically 2 (two) times daily as needed (skin irritation.).   Vitamin D3 50 MCG (2000 UT) Tabs Take 2,000 Units by mouth daily.        Contact information for follow-up providers     Almarie Birmingham B, NP Follow up in 1 week(s).   Specialties: Family Medicine, Emergency Medicine Contact information: [address] [place] [address] [phone]              Contact information for after-discharge care     Destination     [facility] .   Service: Skilled Nursing Contact information: [address] [place]  [address] [phone]                    Allergies[1]  Consultations: None.  Procedures/Studies: DG Chest Port 1 View Result Date: 03/08/2024 CLINICAL DATA:  Questionable sepsis.  Evaluate for abnormality. EXAM: PORTABLE CHEST 1 VIEW COMPARISON:  02/29/2024 FINDINGS: Low lung volumes but concern for increased densities in the right perihilar region. Slightly enlarged central vascular structures. Heart size is grossly stable. Negative for a pneumothorax. No acute bone abnormality. IMPRESSION: 1. Enlarged central vascular structures and increased densities in the right perihilar region. Differential diagnosis includes asymmetric vascular congestion/mild edema versus developing infection. Recommend follow-up. Electronically Signed   By: Juliene Balder M.D.   On: 03/08/2024 15:42   DG Hand 2 View Right Result Date: 03/01/2024 EXAM: 1 or 2 VIEW(S) XRAY OF THE RIGHT HAND 03/01/2024 06:25:00 PM COMPARISON: None available. CLINICAL HISTORY: Pain [id number] FINDINGS: BONES AND JOINTS: No acute fracture. No malalignment. Normal bone mineral density for age. Periarticular erosions are present at the right fifth proximal interphalangeal (PIP) joint. There is extensive surrounding periarticular soft tissue swelling. These findings are in keeping with gouty arthritis or, less likely, septic  arthritis. Moderate degenerative changes are noted at the first digit metacarpophalangeal joint. Severe degenerative changes are present at the first digit carpometacarpal joint. Severe degenerative changes are also seen at the second through fourth digit distal interphalangeal joints, the fifth digit proximal interphalangeal joint, and the second and third digit proximal interphalangeal joints. SOFT TISSUES: Extensive surrounding periarticular soft tissue swelling. IMPRESSION: 1. Periarticular erosions and extensive surrounding periarticular soft tissue swelling of the right fifth PIP joint, most compatible with gouty arthritis, with septic arthritis considered less likely. 2. Severe degenerative changes of the first digit carpometacarpal joint, second through fourth digit distal interphalangeal joints, fifth digit proximal interphalangeal joint, and second and third digit proximal interphalangeal joints. 3. Moderate degenerative changes of the first digit metacarpophalangeal joint. Electronically signed by: Dorethia Molt MD MD 03/01/2024 10:09 PM  EST RP Workstation: [id number]   CT HEAD WO CONTRAST ( ) Result Date: 02/29/2024 CLINICAL DATA:  Mental status change EXAM: CT HEAD WITHOUT CONTRAST TECHNIQUE: Contiguous axial images were obtained from the base of the skull through the vertex without intravenous contrast. RADIATION DOSE REDUCTION: This exam was performed according to the departmental dose-optimization program which includes automated exposure control, adjustment of the mA and/or kV according to patient size and/or use of iterative reconstruction technique. COMPARISON:  MRI report 06/20/2017 FINDINGS: Brain: No acute territorial infarction, hemorrhage or intracranial mass. Small chronic left cerebellar infarct. Mild atrophy and moderate chronic small vessel ischemic changes of the white matter. The ventricles are nonenlarged Vascular: No hyperdense vessels. Vertebral and carotid vascular calcification  Skull: Normal. Negative for fracture or focal lesion. Sinuses/Orbits: No acute finding. Other: None IMPRESSION: 1. No CT evidence for acute intracranial abnormality. 2. Atrophy and chronic small vessel ischemic changes of the white matter. Small chronic left cerebellar infarct. Electronically Signed   By: Luke Bun M.D.   On: 02/29/2024 23:19   DG Chest Port 1 View Result Date: 02/29/2024 EXAM: 1 VIEW XRAY OF THE CHEST 02/29/2024 07:35:00 PM COMPARISON: Comparison with 03/25/2020. CLINICAL HISTORY: Cough, weakness, fatigue, and fever for 2 days. FINDINGS: LUNGS AND PLEURA: Shallow inspiration. Lungs are clear. No pleural effusion. No pneumothorax. HEART AND MEDIASTINUM: Normal heart size and pulmonary vascularity. Calcification of the aorta. BONES AND SOFT TISSUES: Surgical clips in the left upper quadrant. No acute osseous abnormality. IMPRESSION: 1. No acute cardiopulmonary pathology. Electronically signed by: Elsie Gravely MD 02/29/2024 07:38 PM EST RP Workstation: [id number]    Subjective: Patient was seen and examined at bedside.  Overnight events noted. She reports feeling much improved,  redness is slowly improving.  Discharge Exam: Vitals:   03/16/24 0439 03/16/24 0829  BP: 122/75 106/66  Pulse: 68 95  Resp: 15 18  Temp: 97.6 F (36.4 C) (!) 97.5 F (36.4 C)  SpO2: 93% 99%   Vitals:   03/15/24 1915 03/15/24 2231 03/16/24 0439 03/16/24 0829  BP: 110/73 108/65 122/75 106/66  Pulse: (!) 103  68 95  Resp: 16  15 18   Temp: 97.9 F (36.6 C)  97.6 F (36.4 C) (!) 97.5 F (36.4 C)  TempSrc: Oral     SpO2: 93%  93% 99%  Weight:      Height:        General: Pt is alert, awake, not in acute distress Cardiovascular: RRR, S1/S2 +, no rubs, no gallops Respiratory: CTA bilaterally, no wheezing, no rhonchi Abdominal: Soft, NT, ND, bowel sounds + Extremities: no edema, no cyanosis    The results of significant diagnostics from this hospitalization (including imaging,  microbiology, ancillary and laboratory) are listed below for reference.     Microbiology: Recent Results (from the past 240 hours)  Blood Culture (routine x 2)     Status: None   Collection Time: 03/08/24  2:18 PM   Specimen: BLOOD  Result Value Ref Range Status   Specimen Description BLOOD LEFT ANTECUBITAL  Final   Special Requests   Final    BOTTLES DRAWN AEROBIC AND ANAEROBIC Blood Culture adequate volume   Culture   Final    NO GROWTH 5 DAYS Performed at [facility], [address] [place], [place] [address]    Report Status 03/13/2024 FINAL  Final  Blood Culture (routine x 2)     Status: None   Collection Time: 03/08/24  2:23 PM   Specimen: BLOOD  Result  Value Ref Range Status   Specimen Description BLOOD RIGHT ANTECUBITAL  Final   Special Requests   Final    BOTTLES DRAWN AEROBIC AND ANAEROBIC Blood Culture adequate volume   Culture   Final    NO GROWTH 5 DAYS Performed at [facility], [address] [place], [place] [address]    Report Status 03/13/2024 FINAL  Final  Resp panel by RT-PCR (RSV, Flu A&B, Covid) Anterior Nasal Swab     Status: Abnormal   Collection Time: 03/08/24  3:09 PM   Specimen: Anterior Nasal Swab  Result Value Ref Range Status   SARS Coronavirus 2 by RT PCR POSITIVE (A) NEGATIVE Final   Influenza A by PCR NEGATIVE NEGATIVE Final   Influenza B by PCR NEGATIVE NEGATIVE Final    Comment: (NOTE) The Xpert Xpress SARS-CoV-2/FLU/RSV plus assay is intended as an aid in the diagnosis of influenza from Nasopharyngeal swab specimens and should not be used as a sole basis for treatment. Nasal washings and aspirates are unacceptable for Xpert Xpress SARS-CoV-2/FLU/RSV testing.  Fact Sheet for Patients: bloggercourse.com  Fact Sheet for Healthcare Providers: seriousbroker.it  This test is not yet approved or cleared by the [place]  FDA and has been authorized for detection and/or  diagnosis of SARS-CoV-2 by FDA under an Emergency Use Authorization (EUA). This EUA will remain in effect (meaning this test can be used) for the duration of the COVID-19 declaration under Section 564(b)(1) of the Act, 21 U.S.C. section 360bbb-3(b)(1), unless the authorization is terminated or revoked.     Resp Syncytial Virus by PCR NEGATIVE NEGATIVE Final    Comment: (NOTE) Fact Sheet for Patients: bloggercourse.com  Fact Sheet for Healthcare Providers: seriousbroker.it  This test is not yet approved or cleared by the [place]  FDA and has been authorized for detection and/or diagnosis of SARS-CoV-2 by FDA under an Emergency Use Authorization (EUA). This EUA will remain in effect (meaning this test can be used) for the duration of the COVID-19 declaration under Section 564(b)(1) of the Act, 21 U.S.C. section 360bbb-3(b)(1), unless the authorization is terminated or revoked.  Performed at [facility], [address] [place], [place] [address]   MRSA culture     Status: None   Collection Time: 03/08/24  3:26 PM   Specimen: Nasal Mucosa; Body Fluid  Result Value Ref Range Status   Specimen Description NASOPHARYNGEAL  Final   Special Requests NONE  Final   Culture   Final    NO MRSA DETECTED Performed at [facility], [address] [place], [place] [address]    Report Status 03/10/2024 FINAL  Final  MRSA Next Gen by PCR, Nasal     Status: None   Collection Time: 03/08/24  3:37 PM   Specimen: Nasal Mucosa; Nasal Swab  Result Value Ref Range Status   MRSA by PCR Next Gen NOT DETECTED NOT DETECTED Final    Comment: (NOTE) The GeneXpert MRSA Assay (FDA approved for NASAL specimens only), is one component of a comprehensive MRSA colonization surveillance program. It is not intended to diagnose MRSA infection nor to guide or monitor treatment for MRSA infections. Test performance is not FDA approved in  patients less than 71 years old. Performed at [facility], [address], [place], [place] [address]      Labs: BNP (last 3 results) No results for input(s): BNP in the last 8760 hours. Basic Metabolic Panel: Recent Labs  Lab 03/10/24 0250 03/11/24 9686  03/12/24 0323  NA 139 136 136  K 3.4* 3.6 3.4*  CL 103 101 102  CO2 24 23 23   GLUCOSE 149* 193* 173*  BUN 16 22 22   CREATININE 0.68 0.59 0.66  CALCIUM  8.5* 8.4* 8.4*   Liver Function Tests: Recent Labs  Lab 03/10/24 0250 03/11/24 0313 03/12/24 0323  AST 36 25 26  ALT 37 31 32  ALKPHOS 74 71 70  BILITOT <0.2 <0.2 0.2  PROT 6.3* 6.1* 6.0*  ALBUMIN  2.8* 2.7* 2.9*   No results for input(s): LIPASE, AMYLASE in the last 168 hours. No results for input(s): AMMONIA in the last 168 hours. CBC: Recent Labs  Lab 03/10/24 0250 03/11/24 0313 03/12/24 0323  WBC 6.6 5.4 8.6  HGB 13.2 12.8 13.0  HCT 39.6 37.6 38.2  MCV 94.1 92.2 93.4  PLT 278 302 287   Cardiac Enzymes: No results for input(s): CKTOTAL, CKMB, CKMBINDEX, TROPONINI in the last 168 hours. BNP: Invalid input(s): POCBNP CBG: Recent Labs  Lab 03/15/24 0815 03/15/24 1145 03/15/24 1640 03/15/24 2113 03/16/24 0636  GLUCAP 112* 156* 145* 182* 123*   D-Dimer No results for input(s): DDIMER in the last 72 hours. Hgb A1c No results for input(s): HGBA1C in the last 72 hours. Lipid Profile No results for input(s): CHOL, HDL, LDLCALC, TRIG, CHOLHDL, LDLDIRECT in the last 72 hours. Thyroid  function studies No results for input(s): TSH, T4TOTAL, T3FREE, THYROIDAB in the last 72 hours.  Invalid input(s): FREET3 Anemia work up No results for input(s): VITAMINB12, FOLATE, FERRITIN, TIBC, IRON, RETICCTPCT in the last 72 hours. Urinalysis    Component Value Date/Time   COLORURINE YELLOW 03/08/2024 1626   APPEARANCEUR CLOUDY (A) 03/08/2024 1626   LABSPEC 1.013 03/08/2024 1626   PHURINE 6.0  03/08/2024 1626   GLUCOSEU NEGATIVE 03/08/2024 1626   HGBUR SMALL (A) 03/08/2024 1626   BILIRUBINUR NEGATIVE 03/08/2024 1626   BILIRUBINUR negative 12/09/2020 1509   KETONESUR 5 (A) 03/08/2024 1626   PROTEINUR 30 (A) 03/08/2024 1626   UROBILINOGEN 0.2 12/09/2020 1509   NITRITE POSITIVE (A) 03/08/2024 1626   LEUKOCYTESUR LARGE (A) 03/08/2024 1626   Sepsis Labs Recent Labs  Lab 03/10/24 0250 03/11/24 0313 03/12/24 0323  WBC 6.6 5.4 8.6   Microbiology Recent Results (from the past 240 hours)  Blood Culture (routine x 2)     Status: None   Collection Time: 03/08/24  2:18 PM   Specimen: BLOOD  Result Value Ref Range Status   Specimen Description BLOOD LEFT ANTECUBITAL  Final   Special Requests   Final    BOTTLES DRAWN AEROBIC AND ANAEROBIC Blood Culture adequate volume   Culture   Final    NO GROWTH 5 DAYS Performed at [facility], [address] [place], [place] [address]    Report Status 03/13/2024 FINAL  Final  Blood Culture (routine x 2)     Status: None   Collection Time: 03/08/24  2:23 PM   Specimen: BLOOD  Result Value Ref Range Status   Specimen Description BLOOD RIGHT ANTECUBITAL  Final   Special Requests   Final    BOTTLES DRAWN AEROBIC AND ANAEROBIC Blood Culture adequate volume   Culture   Final    NO GROWTH 5 DAYS Performed at [facility], [address], [place], [place] [address]    Report Status 03/13/2024 FINAL  Final  Resp panel by RT-PCR (RSV, Flu A&B, Covid) Anterior Nasal Swab     Status: Abnormal   Collection Time: 03/08/24  3:09 PM   Specimen: Anterior Nasal Swab  Result Value Ref Range Status   SARS Coronavirus 2 by RT PCR POSITIVE (A) NEGATIVE Final   Influenza A by PCR NEGATIVE NEGATIVE Final   Influenza B by PCR NEGATIVE NEGATIVE Final    Comment: (NOTE) The Xpert Xpress SARS-CoV-2/FLU/RSV plus assay is intended as an aid in the diagnosis of influenza from Nasopharyngeal swab specimens and should not be used as a sole basis  for treatment. Nasal washings and aspirates are unacceptable for Xpert Xpress SARS-CoV-2/FLU/RSV testing.  Fact Sheet for Patients: bloggercourse.com  Fact Sheet for Healthcare Providers: seriousbroker.it  This test is not yet approved or cleared by the [place]  FDA and has been authorized for detection and/or diagnosis of SARS-CoV-2 by FDA under an Emergency Use Authorization (EUA). This EUA will remain in effect (meaning this test can be used) for the duration of the COVID-19 declaration under Section 564(b)(1) of the Act, 21 U.S.C. section 360bbb-3(b)(1), unless the authorization is terminated or revoked.     Resp Syncytial Virus by PCR NEGATIVE NEGATIVE Final    Comment: (NOTE) Fact Sheet for Patients: bloggercourse.com  Fact Sheet for Healthcare Providers: seriousbroker.it  This test is not yet approved or cleared by the [place]  FDA and has been authorized for detection and/or diagnosis of SARS-CoV-2 by FDA under an Emergency Use Authorization (EUA). This EUA will remain in effect (meaning this test can be used) for the duration of the COVID-19 declaration under Section 564(b)(1) of the Act, 21 U.S.C. section 360bbb-3(b)(1), unless the authorization is terminated or revoked.  Performed at [facility], [address], [place], [place] [address]   MRSA culture     Status: None   Collection Time: 03/08/24  3:26 PM   Specimen: Nasal Mucosa; Body Fluid  Result Value Ref Range Status   Specimen Description NASOPHARYNGEAL  Final   Special Requests NONE  Final   Culture   Final    NO MRSA DETECTED Performed at [facility], [address], [place], [place] [address]    Report Status 03/10/2024 FINAL  Final  MRSA Next Gen by PCR, Nasal     Status: None   Collection Time: 03/08/24  3:37 PM   Specimen: Nasal Mucosa; Nasal Swab  Result Value Ref Range  Status   MRSA by PCR Next Gen NOT DETECTED NOT DETECTED Final    Comment: (NOTE) The GeneXpert MRSA Assay (FDA approved for NASAL specimens only), is one component of a comprehensive MRSA colonization surveillance program. It is not intended to diagnose MRSA infection nor to guide or monitor treatment for MRSA infections. Test performance is not FDA approved in patients less than 16 years old. Performed at [facility], [address] [place], [place] [address]      Time coordinating discharge: Over 30 minutes  SIGNED:   Vernal Alstrom, MD  [facility] 03/16/2024, 9:36 AM Pager   If 7PM-7AM, please contact night-coverage     [1]  Allergies Allergen Reactions   Arthrotec [Diclofenac -Misoprostol] Shortness Of Breath and Diarrhea   Doxycycline Nausea And Vomiting and Rash    Significant enough to d/c treatment and refuse to take more Vomiting (intolerance) The first time she took it she had N/V and the second she had a rash.      Latex Rash and Other (See Comments)   Amlodipine Swelling    angioedema   Celebrex [Celecoxib] Other (See Comments)    GI Bleeding  Vancomycin  Other (See Comments)    Acute kidney failure   Diclofenac  Diarrhea   Shellfish Allergy Itching and Rash

## 2024-03-15 NOTE — TOC Progression Note (Addendum)
 Transition of Care [facility]) - Progression Note    Patient Details  Name: Dana Bradford MRN: [id number] Date of Birth: 1930/04/07  Transition of Care [facility]) CM/SW Contact  Bridget Cordella Simmonds, LCSW Phone Number: 03/15/2024, 10:36 AM  Clinical Narrative:   Per MD, no DC today, plan for tomorrow.  CSW confirmed with [facility]: they can receive pt tomorrow to room 118, RN report to [phone].  [facility] is weekend contact.  Daughter Channing updated.      1530: Per MD, pt stable for DC today.  Message sent to [facility]: too late for admission today, will plan on tomorrow.                Expected Discharge Plan and Services                                               Social Drivers of Health (SDOH) Interventions SDOH Screenings   Food Insecurity: No Food Insecurity (03/08/2024)  Housing: Low Risk (03/08/2024)  Transportation Needs: No Transportation Needs (03/08/2024)  Utilities: Not At Risk (03/08/2024)  Alcohol  Screen: Low Risk (03/11/2024)  Financial Resource Strain: Patient Declined (06/25/2023)  Physical Activity: Unknown (06/25/2023)  Social Connections: Socially Isolated (03/08/2024)  Stress: Patient Declined (06/25/2023)  Tobacco Use: Medium Risk (03/09/2024)    Readmission Risk Interventions    03/03/2024    3:17 PM  Readmission Risk Prevention Plan  Transportation Screening Complete  PCP or Specialist Appt within 5-7 Days Complete  Home Care Screening Complete  Medication Review (RN CM) Complete

## 2024-03-15 NOTE — TOC Progression Note (Deleted)
 Transition of Care [facility]) - Progression Note    Patient Details  Name: Dana Bradford MRN: [id number] Date of Birth: Mar 09, 1930  Transition of Care [facility]) CM/SW Contact  Bridget Cordella Simmonds, LCSW Phone Number: 03/15/2024, 10:36 AM  Clinical Narrative:   Per MD, no DC today.  [facility] informed.                      Expected Discharge Plan and Services                                               Social Drivers of Health (SDOH) Interventions SDOH Screenings   Food Insecurity: No Food Insecurity (03/08/2024)  Housing: Low Risk (03/08/2024)  Transportation Needs: No Transportation Needs (03/08/2024)  Utilities: Not At Risk (03/08/2024)  Alcohol  Screen: Low Risk (03/11/2024)  Financial Resource Strain: Patient Declined (06/25/2023)  Physical Activity: Unknown (06/25/2023)  Social Connections: Socially Isolated (03/08/2024)  Stress: Patient Declined (06/25/2023)  Tobacco Use: Medium Risk (03/09/2024)    Readmission Risk Interventions    03/03/2024    3:17 PM  Readmission Risk Prevention Plan  Transportation Screening Complete  PCP or Specialist Appt within 5-7 Days Complete  Home Care Screening Complete  Medication Review (RN CM) Complete

## 2024-03-16 ENCOUNTER — Other Ambulatory Visit (HOSPITAL_COMMUNITY): Payer: Self-pay

## 2024-03-16 LAB — GLUCOSE, CAPILLARY: Glucose-Capillary: 123 mg/dL — ABNORMAL HIGH (ref 70–99)

## 2024-03-16 MED ORDER — GERHARDT'S BUTT CREAM: 1.0000 | TOPICAL_CREAM | Freq: Three times a day (TID) | CUTANEOUS | Status: AC

## 2024-03-16 NOTE — Progress Notes (Signed)
 Patient seen and examined at bedside.  No interval changes noted since discharge instructions done yesterday.  Patient is stable for discharge.  TOC notified.

## 2024-03-16 NOTE — TOC Transition Note (Addendum)
 Transition of Care [facility]) - Discharge Note   Patient Details  Name: Dana Bradford MRN: [id number] Date of Birth: [birth year]  Transition of Care [facility]) CM/SW Contact:  Ermalinda Penton Peggs, [place] Phone Number: 03/16/2024, 9:26 AM   Clinical Narrative:    CSW confirmed with [facility]: they can receive pt today to room 118, RN report to [phone]. [facility] contacted for transport. Patient's daughter updated, she has agreed to call her brother to update him as well.   Lacie Landry [address] LCSW Transition of Care [phone]             Patient Goals and CMS Choice            Discharge Placement                       Discharge Plan and Services Additional resources added to the After Visit Summary for                                       Social Drivers of Health (SDOH) Interventions SDOH Screenings   Food Insecurity: No Food Insecurity (03/08/2024)  Housing: Low Risk (03/08/2024)  Transportation Needs: No Transportation Needs (03/08/2024)  Utilities: Not At Risk (03/08/2024)  Alcohol  Screen: Low Risk (03/11/2024)  Financial Resource Strain: Patient Declined (06/25/2023)  Physical Activity: Unknown (06/25/2023)  Social Connections: Socially Isolated (03/08/2024)  Stress: Patient Declined (06/25/2023)  Tobacco Use: Medium Risk (03/09/2024)     Readmission Risk Interventions    03/03/2024    3:17 PM  Readmission Risk Prevention Plan  Transportation Screening Complete  PCP or Specialist Appt within 5-7 Days Complete  Home Care Screening Complete  Medication Review (RN CM) Complete

## 2024-03-16 NOTE — Progress Notes (Signed)
 Patient discharging via [facility], AVS packet received by transportation, Report given to [facility] at [facility]. TOC medication picked up by nurse and places in patient belonging bag. No IV access to be removed.

## 2024-03-25 ENCOUNTER — Inpatient Hospital Stay (HOSPITAL_COMMUNITY)

## 2024-03-25 ENCOUNTER — Emergency Department (HOSPITAL_COMMUNITY)

## 2024-03-25 ENCOUNTER — Other Ambulatory Visit: Payer: Self-pay

## 2024-03-25 ENCOUNTER — Inpatient Hospital Stay (HOSPITAL_COMMUNITY): Admission: EM | Admit: 2024-03-25 | Source: Home / Self Care

## 2024-03-25 DIAGNOSIS — E039 Hypothyroidism, unspecified: Secondary | ICD-10-CM | POA: Diagnosis not present

## 2024-03-25 DIAGNOSIS — E1140 Type 2 diabetes mellitus with diabetic neuropathy, unspecified: Secondary | ICD-10-CM | POA: Diagnosis not present

## 2024-03-25 DIAGNOSIS — I10 Essential (primary) hypertension: Secondary | ICD-10-CM | POA: Insufficient documentation

## 2024-03-25 DIAGNOSIS — A419 Sepsis, unspecified organism: Principal | ICD-10-CM | POA: Diagnosis present

## 2024-03-25 DIAGNOSIS — J9601 Acute respiratory failure with hypoxia: Secondary | ICD-10-CM

## 2024-03-25 DIAGNOSIS — J189 Pneumonia, unspecified organism: Secondary | ICD-10-CM

## 2024-03-25 DIAGNOSIS — I482 Chronic atrial fibrillation, unspecified: Secondary | ICD-10-CM | POA: Diagnosis not present

## 2024-03-25 DIAGNOSIS — K219 Gastro-esophageal reflux disease without esophagitis: Secondary | ICD-10-CM | POA: Diagnosis not present

## 2024-03-25 DIAGNOSIS — I4891 Unspecified atrial fibrillation: Secondary | ICD-10-CM

## 2024-03-25 DIAGNOSIS — M109 Gout, unspecified: Secondary | ICD-10-CM | POA: Diagnosis present

## 2024-03-25 DIAGNOSIS — J1282 Pneumonia due to coronavirus disease 2019: Secondary | ICD-10-CM

## 2024-03-25 DIAGNOSIS — I4821 Permanent atrial fibrillation: Secondary | ICD-10-CM

## 2024-03-25 DIAGNOSIS — J188 Other pneumonia, unspecified organism: Secondary | ICD-10-CM

## 2024-03-25 DIAGNOSIS — G2581 Restless legs syndrome: Secondary | ICD-10-CM | POA: Insufficient documentation

## 2024-03-25 DIAGNOSIS — Z789 Other specified health status: Secondary | ICD-10-CM

## 2024-03-25 LAB — RESP PANEL BY RT-PCR (RSV, FLU A&B, COVID)  RVPGX2
Influenza A by PCR: NEGATIVE
Influenza B by PCR: NEGATIVE
Resp Syncytial Virus by PCR: NEGATIVE
SARS Coronavirus 2 by RT PCR: NEGATIVE

## 2024-03-25 LAB — COMPREHENSIVE METABOLIC PANEL WITH GFR
ALT: 33 U/L (ref 0–44)
AST: 22 U/L (ref 15–41)
Albumin: 3.1 g/dL — ABNORMAL LOW (ref 3.5–5.0)
Alkaline Phosphatase: 87 U/L (ref 38–126)
Anion gap: 11 (ref 5–15)
BUN: 8 mg/dL (ref 8–23)
CO2: 26 mmol/L (ref 22–32)
Calcium: 8.6 mg/dL — ABNORMAL LOW (ref 8.9–10.3)
Chloride: 99 mmol/L (ref 98–111)
Creatinine, Ser: 0.65 mg/dL (ref 0.44–1.00)
GFR, Estimated: >60 mL/min
Glucose, Bld: 106 mg/dL — ABNORMAL HIGH (ref 70–99)
Potassium: 3.6 mmol/L (ref 3.5–5.1)
Sodium: 136 mmol/L (ref 135–145)
Total Bilirubin: 1.2 mg/dL (ref 0.0–1.2)
Total Protein: 6.3 g/dL — ABNORMAL LOW (ref 6.5–8.1)

## 2024-03-25 LAB — TROPONIN T, HIGH SENSITIVITY
Troponin T High Sensitivity: 47 ng/L — ABNORMAL HIGH (ref 0–19)
Troponin T High Sensitivity: 47 ng/L — ABNORMAL HIGH (ref 0–19)

## 2024-03-25 LAB — CBC WITH DIFFERENTIAL/PLATELET
Abs Immature Granulocytes: 0.1 10*3/uL — ABNORMAL HIGH (ref 0.00–0.07)
Basophils Absolute: 0.1 10*3/uL (ref 0.0–0.1)
Basophils Relative: 0 %
Eosinophils Absolute: 0.2 10*3/uL (ref 0.0–0.5)
Eosinophils Relative: 1 %
HCT: 33.5 % — ABNORMAL LOW (ref 36.0–46.0)
Hemoglobin: 11 g/dL — ABNORMAL LOW (ref 12.0–15.0)
Immature Granulocytes: 1 %
Lymphocytes Relative: 16 %
Lymphs Abs: 2.4 10*3/uL (ref 0.7–4.0)
MCH: 31.9 pg (ref 26.0–34.0)
MCHC: 32.8 g/dL (ref 30.0–36.0)
MCV: 97.1 fL (ref 80.0–100.0)
Monocytes Absolute: 1.5 10*3/uL — ABNORMAL HIGH (ref 0.1–1.0)
Monocytes Relative: 10 %
Neutro Abs: 10.9 10*3/uL — ABNORMAL HIGH (ref 1.7–7.7)
Neutrophils Relative %: 72 %
Platelets: 158 10*3/uL (ref 150–400)
RBC: 3.45 MIL/uL — ABNORMAL LOW (ref 3.87–5.11)
RDW: 15.2 % (ref 11.5–15.5)
WBC: 15 10*3/uL — ABNORMAL HIGH (ref 4.0–10.5)
nRBC: 0 % (ref 0.0–0.2)

## 2024-03-25 LAB — URINALYSIS, W/ REFLEX TO CULTURE (INFECTION SUSPECTED)
Bacteria, UA: NONE SEEN
Bilirubin Urine: NEGATIVE
Glucose, UA: NEGATIVE mg/dL
Hgb urine dipstick: NEGATIVE
Ketones, ur: NEGATIVE mg/dL
Leukocytes,Ua: NEGATIVE
Nitrite: NEGATIVE
Protein, ur: 30 mg/dL — AB
Specific Gravity, Urine: 1.016 (ref 1.005–1.030)
pH: 6 (ref 5.0–8.0)

## 2024-03-25 LAB — PROTIME-INR
INR: 1.2 (ref 0.8–1.2)
Prothrombin Time: 15.9 s — ABNORMAL HIGH (ref 11.4–15.2)

## 2024-03-25 LAB — I-STAT CG4 LACTIC ACID, ED: Lactic Acid, Venous: 1.2 mmol/L (ref 0.5–1.9)

## 2024-03-25 MED ORDER — ACETAMINOPHEN 650 MG RE SUPP: 650.0000 mg | Freq: Four times a day (QID) | RECTAL | Status: AC | PRN

## 2024-03-25 MED ORDER — INSULIN ASPART 100 UNIT/ML IJ SOLN: 0.0000 [IU] | Freq: Every day | INTRAMUSCULAR | Status: DC

## 2024-03-25 MED ORDER — SODIUM CHLORIDE 0.9 % IV SOLN
2.0000 g | INTRAVENOUS | Status: DC
  Administered 2024-03-26: 2 g via INTRAVENOUS
  Filled 2024-03-25: qty 20

## 2024-03-25 MED ORDER — SODIUM CHLORIDE 0.9 % IV BOLUS
1000.0000 mL | Freq: Once | INTRAVENOUS | Status: AC
  Administered 2024-03-25: 1000 mL via INTRAVENOUS

## 2024-03-25 MED ORDER — GABAPENTIN 100 MG PO CAPS
200.0000 mg | ORAL_CAPSULE | Freq: Three times a day (TID) | ORAL | Status: DC
  Administered 2024-03-26 – 2024-03-29 (×9): 100 mg via ORAL
  Filled 2024-03-25 (×11): qty 1

## 2024-03-25 MED ORDER — IOHEXOL 350 MG/ML SOLN
50.0000 mL | Freq: Once | INTRAVENOUS | Status: AC | PRN
  Administered 2024-03-25: 50 mL via INTRAVENOUS

## 2024-03-25 MED ORDER — VITAMIN C 500 MG PO TABS
1000.0000 mg | ORAL_TABLET | ORAL | Status: AC
  Administered 2024-03-27 – 2024-03-29 (×2): 1000 mg via ORAL
  Filled 2024-03-25 (×2): qty 2

## 2024-03-25 MED ORDER — MAGNESIUM HYDROXIDE 400 MG/5ML PO SUSP: 30.0000 mL | Freq: Every day | ORAL | Status: AC | PRN

## 2024-03-25 MED ORDER — LACTATED RINGERS IV SOLN
150.0000 mL/h | INTRAVENOUS | Status: DC
  Administered 2024-03-26 (×2): 150 mL/h via INTRAVENOUS

## 2024-03-25 MED ORDER — LACTATED RINGERS IV SOLN: INTRAVENOUS | Status: DC

## 2024-03-25 MED ORDER — ENSURE PLUS HIGH PROTEIN PO LIQD
237.0000 mL | Freq: Two times a day (BID) | ORAL | Status: AC
  Administered 2024-03-27: 237 mL via ORAL
  Filled 2024-03-25 (×2): qty 237

## 2024-03-25 MED ORDER — AZITHROMYCIN 500 MG PO TABS
500.0000 mg | ORAL_TABLET | Freq: Every day | ORAL | Status: DC
  Administered 2024-03-26 – 2024-03-27 (×2): 500 mg via ORAL
  Filled 2024-03-25: qty 2
  Filled 2024-03-25: qty 1

## 2024-03-25 MED ORDER — AMIODARONE HCL IN DEXTROSE 360-4.14 MG/200ML-% IV SOLN
30.0000 mg/h | INTRAVENOUS | Status: DC
  Administered 2024-03-26 (×2): 30 mg/h via INTRAVENOUS
  Filled 2024-03-25: qty 200

## 2024-03-25 MED ORDER — COLCHICINE 0.6 MG PO TABS
0.6000 mg | ORAL_TABLET | Freq: Every day | ORAL | Status: AC
  Administered 2024-03-26 – 2024-03-29 (×4): 0.6 mg via ORAL
  Filled 2024-03-25 (×4): qty 1

## 2024-03-25 MED ORDER — SODIUM CHLORIDE 0.9 % IV SOLN: 500.0000 mg | INTRAVENOUS | Status: DC

## 2024-03-25 MED ORDER — IOHEXOL 350 MG/ML SOLN
62.0000 mL | Freq: Once | INTRAVENOUS | Status: AC | PRN
  Administered 2024-03-25: 62 mL via INTRAVENOUS

## 2024-03-25 MED ORDER — ACETAMINOPHEN 325 MG PO TABS
650.0000 mg | ORAL_TABLET | Freq: Four times a day (QID) | ORAL | Status: AC | PRN
  Administered 2024-03-26 – 2024-03-29 (×2): 650 mg via ORAL
  Filled 2024-03-25 (×3): qty 2

## 2024-03-25 MED ORDER — ACETAMINOPHEN 325 MG PO TABS
650.0000 mg | ORAL_TABLET | Freq: Once | ORAL | Status: AC
  Administered 2024-03-25: 650 mg via ORAL
  Filled 2024-03-25: qty 2

## 2024-03-25 MED ORDER — APIXABAN 2.5 MG PO TABS
2.5000 mg | ORAL_TABLET | Freq: Two times a day (BID) | ORAL | Status: DC
  Administered 2024-03-26 – 2024-03-27 (×5): 2.5 mg via ORAL
  Filled 2024-03-25 (×5): qty 1

## 2024-03-25 MED ORDER — VITAMIN D3 50 MCG (2000 UT) PO TABS
2000.0000 [IU] | ORAL_TABLET | Freq: Every day | ORAL | Status: AC
  Administered 2024-03-26 – 2024-03-29 (×4): 2000 [IU] via ORAL
  Filled 2024-03-25 (×4): qty 2

## 2024-03-25 MED ORDER — PRAMIPEXOLE DIHYDROCHLORIDE 1 MG PO TABS
1.0000 mg | ORAL_TABLET | Freq: Every evening | ORAL | Status: AC
  Administered 2024-03-26 – 2024-03-29 (×4): 1 mg via ORAL
  Filled 2024-03-25 (×5): qty 1

## 2024-03-25 MED ORDER — ONDANSETRON HCL 4 MG PO TABS
4.0000 mg | ORAL_TABLET | Freq: Four times a day (QID) | ORAL | Status: AC | PRN
  Administered 2024-03-29: 4 mg via ORAL
  Filled 2024-03-25: qty 1

## 2024-03-25 MED ORDER — SODIUM CHLORIDE 0.9 % IV SOLN
2.0000 g | Freq: Once | INTRAVENOUS | Status: AC
  Administered 2024-03-25: 2 g via INTRAVENOUS
  Filled 2024-03-25: qty 20

## 2024-03-25 MED ORDER — AMIODARONE HCL IN DEXTROSE 360-4.14 MG/200ML-% IV SOLN
60.0000 mg/h | INTRAVENOUS | Status: AC
  Administered 2024-03-25: 60 mg/h via INTRAVENOUS
  Filled 2024-03-25 (×2): qty 200

## 2024-03-25 MED ORDER — AZITHROMYCIN 250 MG PO TABS
500.0000 mg | ORAL_TABLET | Freq: Once | ORAL | Status: AC
  Administered 2024-03-25: 500 mg via ORAL
  Filled 2024-03-25: qty 2

## 2024-03-25 MED ORDER — SODIUM CHLORIDE 0.9 % IV SOLN: 500.0000 mg | Freq: Once | INTRAVENOUS | Status: DC

## 2024-03-25 MED ORDER — ONDANSETRON HCL 4 MG/2ML IJ SOLN: 4.0000 mg | Freq: Four times a day (QID) | INTRAMUSCULAR | Status: AC | PRN

## 2024-03-25 MED ORDER — MELATONIN 5 MG PO TABS
5.0000 mg | ORAL_TABLET | Freq: Every evening | ORAL | Status: AC | PRN
  Administered 2024-03-26 – 2024-03-27 (×2): 5 mg via ORAL
  Filled 2024-03-25 (×2): qty 1

## 2024-03-25 MED ORDER — PRAVASTATIN SODIUM 10 MG PO TABS
20.0000 mg | ORAL_TABLET | Freq: Every day | ORAL | Status: AC
  Administered 2024-03-27 – 2024-03-28 (×2): 20 mg via ORAL
  Filled 2024-03-25 (×4): qty 2

## 2024-03-25 MED ORDER — INSULIN ASPART 100 UNIT/ML IJ SOLN: 0.0000 [IU] | Freq: Three times a day (TID) | INTRAMUSCULAR | Status: DC

## 2024-03-25 NOTE — Sepsis Progress Note (Signed)
 Following for sepsis monitoring ?

## 2024-03-25 NOTE — ED Notes (Signed)
 1st lac 1.2 in normal range, 2nd not needed can be canceled if Dr mirian

## 2024-03-25 NOTE — ED Triage Notes (Signed)
 Pt brought by EMS from [facility]. Reports Pt being treat for PNA post Covid. Fever of 100.7. SHOB noted. Pt has abfib HR 140. 20G L forearm. 300LR given by EMS.  EMS vitals 138/80 P 140 CBG 120 O2 97% on 2liters pt normally not on O2

## 2024-03-26 ENCOUNTER — Inpatient Hospital Stay (HOSPITAL_COMMUNITY)

## 2024-03-26 DIAGNOSIS — J9601 Acute respiratory failure with hypoxia: Secondary | ICD-10-CM

## 2024-03-26 DIAGNOSIS — I482 Chronic atrial fibrillation, unspecified: Secondary | ICD-10-CM

## 2024-03-26 DIAGNOSIS — I10 Essential (primary) hypertension: Secondary | ICD-10-CM | POA: Insufficient documentation

## 2024-03-26 DIAGNOSIS — J1282 Pneumonia due to coronavirus disease 2019: Secondary | ICD-10-CM

## 2024-03-26 DIAGNOSIS — U071 COVID-19: Secondary | ICD-10-CM | POA: Diagnosis not present

## 2024-03-26 DIAGNOSIS — E1140 Type 2 diabetes mellitus with diabetic neuropathy, unspecified: Secondary | ICD-10-CM

## 2024-03-26 DIAGNOSIS — K219 Gastro-esophageal reflux disease without esophagitis: Secondary | ICD-10-CM | POA: Insufficient documentation

## 2024-03-26 DIAGNOSIS — G2581 Restless legs syndrome: Secondary | ICD-10-CM | POA: Insufficient documentation

## 2024-03-26 LAB — PHOSPHORUS: Phosphorus: 2.8 mg/dL (ref 2.5–4.6)

## 2024-03-26 LAB — BASIC METABOLIC PANEL WITH GFR
Anion gap: 10 (ref 5–15)
BUN: 6 mg/dL — ABNORMAL LOW (ref 8–23)
CO2: 25 mmol/L (ref 22–32)
Calcium: 7.9 mg/dL — ABNORMAL LOW (ref 8.9–10.3)
Chloride: 101 mmol/L (ref 98–111)
Creatinine, Ser: 0.56 mg/dL (ref 0.44–1.00)
GFR, Estimated: >60 mL/min
Glucose, Bld: 107 mg/dL — ABNORMAL HIGH (ref 70–99)
Potassium: 3.5 mmol/L (ref 3.5–5.1)
Sodium: 136 mmol/L (ref 135–145)

## 2024-03-26 LAB — BLOOD GAS, VENOUS
Acid-Base Excess: 3.2 mmol/L — ABNORMAL HIGH (ref 0.0–2.0)
Bicarbonate: 27.9 mmol/L (ref 20.0–28.0)
Drawn by: 1470
O2 Saturation: 94.1 %
Patient temperature: 36.6
pCO2, Ven: 41 mmHg — ABNORMAL LOW (ref 44–60)
pH, Ven: 7.44 — ABNORMAL HIGH (ref 7.25–7.43)
pO2, Ven: 65 mmHg — ABNORMAL HIGH (ref 32–45)

## 2024-03-26 LAB — MAGNESIUM: Magnesium: 1.5 mg/dL — ABNORMAL LOW (ref 1.7–2.4)

## 2024-03-26 LAB — CBG MONITORING, ED
Glucose-Capillary: 104 mg/dL — ABNORMAL HIGH (ref 70–99)
Glucose-Capillary: 117 mg/dL — ABNORMAL HIGH (ref 70–99)
Glucose-Capillary: 85 mg/dL (ref 70–99)
Glucose-Capillary: 94 mg/dL (ref 70–99)

## 2024-03-26 LAB — CBC
HCT: 31 % — ABNORMAL LOW (ref 36.0–46.0)
Hemoglobin: 10 g/dL — ABNORMAL LOW (ref 12.0–15.0)
MCH: 31.3 pg (ref 26.0–34.0)
MCHC: 32.3 g/dL (ref 30.0–36.0)
MCV: 97.2 fL (ref 80.0–100.0)
Platelets: 149 10*3/uL — ABNORMAL LOW (ref 150–400)
RBC: 3.19 MIL/uL — ABNORMAL LOW (ref 3.87–5.11)
RDW: 15.3 % (ref 11.5–15.5)
WBC: 14.4 10*3/uL — ABNORMAL HIGH (ref 4.0–10.5)
nRBC: 0 % (ref 0.0–0.2)

## 2024-03-26 LAB — GLUCOSE, CAPILLARY: Glucose-Capillary: 96 mg/dL (ref 70–99)

## 2024-03-26 LAB — PROTIME-INR
INR: 1.4 — ABNORMAL HIGH (ref 0.8–1.2)
Prothrombin Time: 18.3 s — ABNORMAL HIGH (ref 11.4–15.2)

## 2024-03-26 LAB — PRO BRAIN NATRIURETIC PEPTIDE: Pro Brain Natriuretic Peptide: 8099 pg/mL — ABNORMAL HIGH

## 2024-03-26 LAB — PROCALCITONIN: Procalcitonin: 0.24 ng/mL

## 2024-03-26 LAB — STREP PNEUMONIAE URINARY ANTIGEN: Strep Pneumo Urinary Antigen: NEGATIVE

## 2024-03-26 LAB — CORTISOL-AM, BLOOD: Cortisol - AM: 14.4 ug/dL (ref 6.7–22.6)

## 2024-03-26 MED ORDER — MAGNESIUM SULFATE 2 GM/50ML IV SOLN
2.0000 g | Freq: Once | INTRAVENOUS | Status: AC
  Administered 2024-03-26: 2 g via INTRAVENOUS
  Filled 2024-03-26: qty 50

## 2024-03-26 MED ORDER — METOPROLOL SUCCINATE ER 25 MG PO TB24: 50.0000 mg | ORAL_TABLET | Freq: Every day | ORAL | Status: DC

## 2024-03-26 MED ORDER — POTASSIUM CHLORIDE CRYS ER 20 MEQ PO TBCR
40.0000 meq | EXTENDED_RELEASE_TABLET | Freq: Once | ORAL | Status: AC
  Administered 2024-03-26: 40 meq via ORAL
  Filled 2024-03-26: qty 2

## 2024-03-26 MED ORDER — FUROSEMIDE 10 MG/ML IJ SOLN: 40.0000 mg | Freq: Once | INTRAMUSCULAR | Status: DC

## 2024-03-26 MED ORDER — SODIUM CHLORIDE 0.9 % IV SOLN
2.0000 g | Freq: Two times a day (BID) | INTRAVENOUS | Status: DC
  Administered 2024-03-26 – 2024-03-28 (×4): 2 g via INTRAVENOUS
  Filled 2024-03-26 (×4): qty 12.5

## 2024-03-26 MED ORDER — ZINC OXIDE 40 % EX OINT
TOPICAL_OINTMENT | CUTANEOUS | Status: AC | PRN
  Filled 2024-03-26: qty 57

## 2024-03-26 MED ORDER — METOPROLOL SUCCINATE ER 50 MG PO TB24
50.0000 mg | ORAL_TABLET | Freq: Every day | ORAL | Status: DC
  Administered 2024-03-26: 50 mg via ORAL
  Filled 2024-03-26: qty 1
  Filled 2024-03-26: qty 2

## 2024-03-26 MED ORDER — FUROSEMIDE 10 MG/ML IJ SOLN
20.0000 mg | Freq: Once | INTRAMUSCULAR | Status: AC
  Administered 2024-03-26: 20 mg via INTRAVENOUS
  Filled 2024-03-26: qty 2

## 2024-03-26 MED ORDER — INSULIN ASPART 100 UNIT/ML IJ SOLN: 0.0000 [IU] | Freq: Three times a day (TID) | INTRAMUSCULAR | Status: AC

## 2024-03-26 MED ORDER — LEVOTHYROXINE SODIUM 88 MCG PO TABS
88.0000 ug | ORAL_TABLET | Freq: Every day | ORAL | Status: AC
  Administered 2024-03-27 – 2024-03-28 (×2): 88 ug via ORAL
  Filled 2024-03-26 (×2): qty 1

## 2024-03-26 MED ORDER — LACTATED RINGERS IV SOLN: INTRAVENOUS | Status: AC

## 2024-03-26 NOTE — Assessment & Plan Note (Signed)
-   The patient was started on IV amiodarone  will be continued. - Will continue Eliquis . - Cardiology consult to be obtained.

## 2024-03-26 NOTE — ED Notes (Signed)
 Pt bp decrease with SBP in the 80's. Provider made aware. Pressure checked in bilateral arms and with a manual cuff.

## 2024-03-26 NOTE — ED Notes (Signed)
 Fluids discontinued per MD at bedside

## 2024-03-26 NOTE — Assessment & Plan Note (Signed)
-   Will continue colchicine .

## 2024-03-26 NOTE — Assessment & Plan Note (Signed)
-   The patient will be placed on supplemental coverage with NovoLog. - Will hold off metformin. - Will continue Neurontin.

## 2024-03-26 NOTE — ED Notes (Signed)
 Pt noted to be having a coughing episode causing vitals to elevate and oxygen to decrease. Nurse assisted with sitting her up more and encouraging her to deep breath and cough hard.

## 2024-03-26 NOTE — Assessment & Plan Note (Signed)
-   Will continue Synthroid .

## 2024-03-26 NOTE — ED Notes (Signed)
Floor notified.

## 2024-03-26 NOTE — Assessment & Plan Note (Signed)
-   Will hold off antihypertensive therapy given borderline BP in the setting of sepsis.

## 2024-03-26 NOTE — ED Notes (Signed)
 Pts

## 2024-03-26 NOTE — ED Notes (Signed)
I tried to get patient blood I had no success.

## 2024-03-26 NOTE — Assessment & Plan Note (Signed)
-   Will continue PPI therapy.

## 2024-03-27 ENCOUNTER — Inpatient Hospital Stay (HOSPITAL_COMMUNITY)

## 2024-03-27 ENCOUNTER — Encounter (HOSPITAL_COMMUNITY): Payer: Self-pay | Admitting: Family Medicine

## 2024-03-27 LAB — GLUCOSE, CAPILLARY
Glucose-Capillary: 78 mg/dL (ref 70–99)
Glucose-Capillary: 87 mg/dL (ref 70–99)
Glucose-Capillary: 116 mg/dL — ABNORMAL HIGH (ref 70–99)
Glucose-Capillary: 95 mg/dL (ref 70–99)

## 2024-03-27 LAB — C-REACTIVE PROTEIN: CRP: 39.3 mg/dL — ABNORMAL HIGH

## 2024-03-27 LAB — SEDIMENTATION RATE: Sed Rate: 89 mm/h — ABNORMAL HIGH (ref 0–22)

## 2024-03-27 LAB — PROCALCITONIN: Procalcitonin: 2.3 ng/mL

## 2024-03-27 LAB — URIC ACID: Uric Acid, Serum: 5.2 mg/dL (ref 2.5–7.1)

## 2024-03-27 MED ORDER — TRAMADOL HCL 50 MG PO TABS
50.0000 mg | ORAL_TABLET | Freq: Four times a day (QID) | ORAL | Status: AC | PRN
  Filled 2024-03-27: qty 1

## 2024-03-27 MED ORDER — PREDNISONE 20 MG PO TABS
40.0000 mg | ORAL_TABLET | Freq: Every day | ORAL | Status: AC
  Administered 2024-03-28 – 2024-03-29 (×2): 40 mg via ORAL
  Filled 2024-03-27 (×2): qty 2

## 2024-03-27 MED ORDER — METOPROLOL TARTRATE 12.5 MG HALF TABLET
12.5000 mg | ORAL_TABLET | Freq: Two times a day (BID) | ORAL | Status: AC
  Administered 2024-03-27 – 2024-03-29 (×5): 12.5 mg via ORAL
  Filled 2024-03-27 (×6): qty 1

## 2024-03-27 NOTE — Consult Note (Addendum)
" ° ° °  CLINICAL SUPPORT TEAM - WOUND OSTOMY AND CONTINENCE TEAM  CONSULTATION SERVICES   WOC Nurse-Inpatient Note   WOC Nurse Consult Note: Reason for Consult: Consult requested for right heel and right ankle.  Performed remotely after review of progress notes and photos in the EMR.  Right ankle with red moist chronic full thickness wound with patchy areas of brown dry scabs surrounding.   Right outer heel with chronic Stage 3 pressure injury; 2X2.5cm, according to bedside nurses' wound care flow sheet, and present on admission.  Chronic Stage 3 pressure injury, red and moist with dry yellow raised callous areas to edges and scattered brown scabs.  Pressure Injury POA: Yes   Dressing procedure/placement/frequency: Float heel to reduce pressure.  Topical treatment orders provided for bedside nurses to perform as follows to promote moist healing: Apply Xeroform gauze to right heel and right ankle wounds Q day, then cover with foam dressings. Change foam dressings Q 3 days or PRN soiling.  Please re-consult if further assistance is needed.  Thank-you,  Stephane Fought MSN, RN, CWOCN, CWCN-AP, CNS Contact Mon-Fri 0700-1500: [phone]     "

## 2024-03-27 NOTE — TOC Initial Note (Signed)
 Transition of Care [facility]) - Initial/Assessment Note    Patient Details  Name: Dana Bradford MRN: [id number] Date of Birth: [birth year]  Transition of Care [facility]) CM/SW Contact:    Inocente GORMAN Kindle, LCSW Phone Number: 03/27/2024, 8:50 AM  Clinical Narrative:                 Patient was admitted from [facility] where she was undergoing rehab since 1/13. CSW continuing to follow for needs.   Expected Discharge Plan: Skilled Nursing Facility Barriers to Discharge: Continued Medical Work up   Patient Goals and CMS Choice Patient states their goals for this hospitalization and ongoing recovery are:: Return to SNF          Expected Discharge Plan and Services In-house Referral: Clinical Social Work   Post Acute Care Choice: Skilled Nursing Facility Living arrangements for the past 2 months: Single Family Home                                      Prior Living Arrangements/Services Living arrangements for the past 2 months: Single Family Home Lives with:: Self Patient language and need for interpreter reviewed:: Yes Do you feel safe going back to the place where you live?: Yes      Need for Family Participation in Patient Care: Yes (Comment) Care giver support system in place?: Yes (comment) Current home services: DME Criminal Activity/Legal Involvement Pertinent to Current Situation/Hospitalization: No - Comment as needed  Activities of Daily Living      Permission Sought/Granted Permission sought to share information with : Facility Medical Sales Representative, Family Supports Permission granted to share information with : No  Share Information with NAME: [name] -Daughter [phone]  Permission granted to share info w AGENCY: SNF        Emotional Assessment Appearance:: Appears stated age Attitude/Demeanor/Rapport: Unable to Assess Affect (typically observed): Unable to Assess Orientation: : Oriented to Self Alcohol  / Substance Use: Not Applicable Psych  Involvement: No (comment)  Admission diagnosis:  Restless leg syndrome [G25.81] Permanent atrial fibrillation (HCC) [I48.21] Acute respiratory failure with hypoxia (HCC) [J96.01] Atrial fibrillation with RVR (HCC) [I48.91] Afebrile [Z78.9] Sepsis due to pneumonia (HCC) [J18.9, A41.9] Multifocal pneumonia [J18.8] Sepsis, due to unspecified organism, unspecified whether acute organ dysfunction present (HCC) [A41.9] Pneumonia due to COVID-19 virus [U07.1, J12.82] Patient Active Problem List   Diagnosis Date Noted   Chronic atrial fibrillation with RVR (HCC) 03/26/2024   Essential hypertension 03/26/2024   Type 2 diabetes mellitus with diabetic neuropathy (HCC) 03/26/2024   GERD without esophagitis 03/26/2024   RLS (restless legs syndrome) 03/26/2024   Sepsis due to pneumonia (HCC) 03/25/2024   Acute respiratory failure with hypoxia (HCC) 03/08/2024   Gout 03/01/2024   Atrial fibrillation with RVR (HCC) 03/01/2024   COVID-19 02/29/2024   Malignant neoplasm of overlapping sites of left female breast (HCC) 09/26/2022   Osteoporosis 09/16/2021   Frequent UTI 06/25/2021   Stage 3b chronic kidney disease (HCC) 02/23/2021   Chronic anticoagulation 05/12/2020   Venous stasis ulcer (HCC)    Skin cancer    Glaucoma    Food allergy    Permanent atrial fibrillation (HCC)    Arthritis    Anemia    CHF (congestive heart failure) (HCC) 04/22/2020   History of revision of total replacement of left hip joint 01/31/2020   Hypothyroidism 01/19/2020   Hyperlipidemia 01/19/2020   Degeneration of lumbar intervertebral disc 12/02/2019  Degenerative spondylolisthesis 12/02/2019   Scoliosis deformity of spine 12/02/2019   Back pain 08/24/2019   Chronic venous insufficiency 03/01/2019   Diabetic ulcer of toe of right foot associated with type 2 diabetes mellitus, with fat layer exposed (HCC) 02/08/2019   Venous stasis dermatitis of left lower extremity 06/05/2018   OA (osteoarthritis) of hip  02/28/2018   Pain in joint of right hip 02/06/2018   Controlled type 2 diabetes mellitus with hyperglycemia, without long-term current use of insulin  (HCC) 10/18/2017   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 macular degeneration of left eye 10/18/2017   Primary open angle glaucoma (POAG) of both eyes, indeterminate stage 10/18/2017   Retinal artery branch occlusion of right eye 06/12/2017   History of CVA (cerebrovascular accident) 05/2017   Status post amputation of lesser toe of right foot 05/13/2017   Lymphedema of both lower extremities 12/26/2014   Venous hypertension of both lower extremities 12/26/2014   Vaginal vault prolapse 08/12/2014   GERD (gastroesophageal reflux disease) 11/13/2012   Essential hypertension, benign 06/17/2012   Restless leg syndrome 06/05/2012   PCP:  Almarie Waddell NOVAK, NP Pharmacy:   [facility] DELIVERY - [place], [place] - [address] [place] [address] Phone: [phone] Fax: [phone]     Social Drivers of Health (SDOH) Social History: SDOH Screenings   Food Insecurity: No Food Insecurity (03/08/2024)  Housing: Low Risk (03/08/2024)  Transportation Needs: No Transportation Needs (03/08/2024)  Utilities: Not At Risk (03/08/2024)  Alcohol  Screen: Low Risk (03/11/2024)  Financial Resource Strain: Patient Declined (06/25/2023)  Physical Activity: Unknown (06/25/2023)  Social Connections: Socially Isolated (03/08/2024)  Stress: Patient Declined (06/25/2023)  Tobacco Use: Medium Risk (03/09/2024)   SDOH Interventions:     Readmission Risk Interventions    03/27/2024    8:47 AM 03/03/2024    3:17 PM  Readmission Risk Prevention Plan  Transportation Screening Complete Complete  PCP or Specialist Appt within 5-7 Days  Complete  Home Care Screening  Complete  Medication Review (RN CM)  Complete  Medication Review (RN Care Manager) Complete   PCP or Specialist appointment within 3-5 days of discharge Complete    HRI or Home Care Consult Complete   SW Recovery Care/Counseling Consult Complete   Palliative Care Screening Not Applicable   Skilled Nursing Facility Complete

## 2024-03-27 NOTE — Plan of Care (Signed)
  Problem: Fluid Volume: Goal: Hemodynamic stability will improve Outcome: Progressing   Problem: Clinical Measurements: Goal: Signs and symptoms of infection will decrease Outcome: Progressing   Problem: Respiratory: Goal: Ability to maintain adequate ventilation will improve Outcome: Progressing   Problem: Coping: Goal: Ability to adjust to condition or change in health will improve Outcome: Progressing

## 2024-03-27 NOTE — Progress Notes (Signed)
 [facility] Progress Note Patient: Dana Bradford FMW:[id number] DOB: 1930-06-03  DOA: 03/25/2024 DOS: the patient was seen and examined on 03/27/2024  Brief Summary: Patient with PMH of T2DM, chronic A-fib HFpEF, CKD 3B, HTN, HLD, GERD, RLS, breast cancer, anxiety, depression, hypothyroidism presented to the hospital with complaints of shortness of breath.  Significant events: 1/8 - 1/13 >> admitted for COVID-19 infection.  Treated with steroid.  Lasix  was changed from scheduled to as needed and Zaroxolyn  was held. 1/16 - 1/24>> readmitted for hypoxia.  Concern for bacterial pneumonia.  Treated with antibiotics. 2/2>> brought from [facility] with fever 100.7 2/3>> cardiology was consulted for A-fib with RVR.  Pulmonary was consulted due to recurrent presentation with pneumonia like symptoms. Consults: Cardiology Pulmonary  Assessment and plan: Sepsis secondary to recurrent multifocal pneumonia. Acute hypoxic respiratory failure. Required BiPAP initially Met SIRS criteria upon admission with fever, tachycardia, tachypnea and leukocytosis. Chest x-ray showing possibility of a multifocal pneumonia. Mildly hypoxic upon admission as well. Started on IV antibiotics cefepime  and azithromycin . Procalcitonin 0.24.  Troponin minimally elevated.  proBNP elevated.  VBG showed hypoxia.  Without any hypercarbia. CT PE protocol negative for PE. Pulmonary was also consulted. For now continue with IV cefepime .  Follow-up on blood cultures.  Low threshold to switch to oral antibiotics tomorrow on 2/5. Completed course of azithromycin . Currently no indication for steroid use.  Chronic A-fib with RVR. Cardiology consulted.  Has chronic A-fib. On metoprolol . On Eliquis . Monitor on telemetry.  Chronic HFpEF. Appears to be euvolemic. BNP was elevated. Blood pressure is also relatively soft. Was on Lasix  and Zaroxolyn .  Currently medications on hold. Will recheck BNP levels  tomorrow.  Hypomagnesemia. Corrected.  HTN. On metoprolol  and hydralazine  but Currently metoprolol . Monitor.  Hypothyroidism. Continue Synthroid . Cortisol level WNL.  HLD. Continuing statin.  Type 2 diabetes mellitus, well-controlled without long-term insulin  use. A1c 6.2. Currently on sliding scale insulin .  History of CVA. On statin and Eliquis .  History of gout with recent exacerbation. On colchicine . Continue.  History of breast cancer status postlumpectomy. Outpatient follow-up with oncology.  Restless leg syndrome. Home regimen includes pramipexole  and gabapentin . For now continue.  Right heel pressure injury. Stage III. Present on admission. Continue wound care.    Code Status: Limited: Do not attempt resuscitation (DNR) -DNR-LIMITED -Do Not Intubate/DNI     DVT Prophylaxis: apixaban  (ELIQUIS ) tablet 2.5 mg Start: 03/26/24 0015 apixaban  (ELIQUIS ) tablet 2.5 mg   Data review I have Reviewed nursing notes, Vitals, and Lab results. Since last encounter, pertinent lab results CBC and BMP   . I have ordered test including CBC and BMP  .   Physical exam. Vitals:   03/27/24 0403 03/27/24 0457 03/27/24 0800 03/27/24 1200  BP: 90/71  (!) 93/59 101/67  Pulse: 100 88 91 95  Resp: (!) 21 18 18 20   Temp: 98.1 F (36.7 C)  98 F (36.7 C) 98.2 F (36.8 C)  TempSrc: Oral  Oral Oral  SpO2: 93%  92% 92%  Weight:      Height:      Basal crackles. S1-S2 present. Bowel sound present. Trace edema of lower extremity.  Subjective: Denies any acute complaint.  Reports breathing is better.  Reports cough is better.  No nausea no vomiting.  Family Communication: No one at bedside.  Disposition Plan: Status is: Inpatient Remains inpatient appropriate because: Monitor for improvement in pneumonia symptoms.   Planned Discharge Destination:Skilled nursing facility Diet: Diet Order  Diet Carb Modified Room service appropriate? Yes  Diet effective now                    MEDICATIONS: Scheduled Meds:  apixaban   2.5 mg Oral BID   ascorbic acid   1,000 mg Oral Once per day on Monday Wednesday Friday   cholecalciferol   2,000 Units Oral Daily   colchicine   0.6 mg Oral Daily   feeding supplement  237 mL Oral BID BM   gabapentin   100 mg Oral TID   insulin  aspart  0-6 Units Subcutaneous TID WC   levothyroxine   88 mcg Oral QAC breakfast   metoprolol  tartrate  12.5 mg Oral BID   pramipexole   1 mg Oral QHS   pravastatin   20 mg Oral q1800   Continuous Infusions:  ceFEPime  (MAXIPIME ) IV 2 g (03/27/24 0642)   liver oil-zinc  oxide     PRN Meds:.acetaminophen  **OR** acetaminophen , liver oil-zinc  oxide, magnesium  hydroxide, melatonin, ondansetron  **OR** ondansetron  (ZOFRAN ) IV  Author: Yetta Blanch, MD  [facility] 03/27/2024  3:57 PM Between 7PM-7AM, please contact night-coverage, check [email or web address] for on call.

## 2024-03-27 NOTE — Discharge Instructions (Addendum)
 Information on my medicine - ELIQUIS  (apixaban )  This medication education was reviewed with me or my healthcare representative as part of my discharge preparation.   Why was Eliquis  prescribed for you? Eliquis  was prescribed for you to reduce the risk of a blood clot forming that can cause a stroke if you have a medical condition called atrial fibrillation (a type of irregular heartbeat).  What do You need to know about Eliquis  ? Take your Eliquis  TWICE DAILY - one tablet in the morning and one tablet in the evening with or without food. If you have difficulty swallowing the tablet whole please discuss with your pharmacist how to take the medication safely.  Take Eliquis  exactly as prescribed by your doctor and DO NOT stop taking Eliquis  without talking to the doctor who prescribed the medication.  Stopping may increase your risk of developing a stroke.  Refill your prescription before you run out.  After discharge, you should have regular check-up appointments with your healthcare provider that is prescribing your Eliquis .  In the future your dose may need to be changed if your kidney function or weight changes by a significant amount or as you get older.  What do you do if you miss a dose? If you miss a dose, take it as soon as you remember on the same day and resume taking twice daily.  Do not take more than one dose of ELIQUIS  at the same time to make up a missed dose.  Important Safety Information A possible side effect of Eliquis  is bleeding. You should call your healthcare provider right away if you experience any of the following: Bleeding from an injury or your nose that does not stop. Unusual colored urine (red or dark brown) or unusual colored stools (red or black). Unusual bruising for unknown reasons. A serious fall or if you hit your head (even if there is no bleeding).  Some medicines may interact with Eliquis  and might increase your risk of bleeding or clotting  while on Eliquis . To help avoid this, consult your healthcare provider or pharmacist prior to using any new prescription or non-prescription medications, including herbals, vitamins, non-steroidal anti-inflammatory drugs (NSAIDs) and supplements.  This website has more information on Eliquis  (apixaban ): [email or web address] .[email or web address] [email or web address]     To address social isolation and forming connections:  Education Officer, [employer]: [phone] / [address] [place], [place] [address]  -[employer]: From community-building activities like luncheons and group outings to fitness activities like chair tap, tai chi and more, [employer] has a variety of offerings specifically created for active older adults. Some of these high-quality programs are free and included with your membership, while others require an additional fee. Some Medicare Plans cover Silver Sneakers programs.   [facility]: [address] [place], [place] [address]. Phone [phone].  [facility] Branch: [address], [place], [place] [address]. Phone [phone]. This group of seniors, called the [facility] [employer], meets every Tuesday at 12:30pm. Enjoy bingo, one-day-trips, fun activities, and so much more!  -[employer] Branch: [address], [place], [place] [address]. Phone [phone]. Tai chi is often described as meditation in motion. There is growing evidence that this mind-body practice has value in treating or preventing many health problems. You can get started even if you arent in top shape or the best of health. Offered Monday & Wednesday at 10:45 am in Room 1, $35 for Members and $55 for Community Guests per month.  [facility]  [facility]: [address] [place], [place] [address]. Phone [phone]. Tai chi is often described as meditation in motion. There is growing evidence that this mind-body practice has value in treating or preventing many health problems. You  can get started even if you arent in top shape or the best of health. Offered Monday & Wednesday at 8:50am, cost is $35 per month for [facility] Members and $60 per month for [employer].  [facility]: [address] [place], [place], [place] [address]. Phone [phone]  -[facility]: [address] [place], [place] [address]. Phone [phone].  -[facility]: [address] [place], [place] [address]. Phone [phone].  -Dial 988: Talk lifeline 24/7.  -[facility] [place]  - [facility] Warmline: [phone]) [phone]  -Enroll in PACE program (Program of All-Inclusive Care for the Elderly): a Medicare and Medicaid initiative that provides comprehensive medical and social services to frail seniors (55+) who need nursing home-level care but prefer to stay in their communities, allowing them to age in place with support like primary care, therapy, meals, and transportation, coordinated by an interdisciplinary team. If you have both Medicare (for people 65+) and Medicaid (income-tested), then you may pay nothing. People without Medicare or Medicaid pay a monthly premium. The amount of this premium depends on your healthcare and financial needs. Long-term care insurance may pay for your PACE care. This coverage is determined by your insurer. Enrollment Information: [phone] Office: [phone]

## 2024-03-27 NOTE — Progress Notes (Signed)
 PCCM to sign off, recommendations as prior. Discussed with hospitalist Please reconsult as needed  Anice Wilshire V. Jude MD

## 2024-03-27 NOTE — Progress Notes (Signed)
 "  Rounding Note   Patient Name: Dana Bradford Date of Encounter: 03/27/2024  [facility] Cardiologist: Annabella Scarce, MD   Subjective Feeling a little better  Scheduled Meds:  apixaban   2.5 mg Oral BID   ascorbic acid   1,000 mg Oral Once per day on Monday Wednesday Friday   cholecalciferol   2,000 Units Oral Daily   colchicine   0.6 mg Oral Daily   feeding supplement  237 mL Oral BID BM   gabapentin   100 mg Oral TID   insulin  aspart  0-6 Units Subcutaneous TID WC   levothyroxine   88 mcg Oral QAC breakfast   metoprolol  succinate  50 mg Oral Daily   pramipexole   1 mg Oral QHS   pravastatin   20 mg Oral q1800   Continuous Infusions:  ceFEPime  (MAXIPIME ) IV 2 g (03/27/24 0642)   liver oil-zinc  oxide     PRN Meds: acetaminophen  **OR** acetaminophen , liver oil-zinc  oxide, magnesium  hydroxide, melatonin, ondansetron  **OR** ondansetron  (ZOFRAN ) IV   Vital Signs  Vitals:   03/27/24 0000 03/27/24 0403 03/27/24 0457 03/27/24 0800  BP: 104/76 90/71  (!) 93/59  Pulse: (!) 109 100 88 91  Resp: 20 (!) 21 18 18   Temp: 97.6 F (36.4 C) 98.1 F (36.7 C)  98 F (36.7 C)  TempSrc: Axillary Oral  Oral  SpO2: 91% 93%  92%  Weight:      Height:        Intake/Output Summary (Last 24 hours) at 03/27/2024 1104 Last data filed at 03/26/2024 2130 Gross per 24 hour  Intake --  Output 275 ml  Net -275 ml      03/25/2024    6:36 PM 03/08/2024   10:23 PM 03/01/2024    2:13 PM  Last 3 Weights  Weight (lbs) 141 lb 1.5 oz 139 lb 12.4 oz 141 lb 12.1 oz  Weight (kg) 64 kg 63.4 kg 64.3 kg      Telemetry Atrial fibrillation.  Rate 80s-110s - Personally Reviewed  ECG  N/a - Personally Reviewed  Physical Exam  VS:  BP (!) 93/59 (BP Location: Left Arm)   Pulse 91   Temp 98 F (36.7 C) (Oral)   Resp 18   Ht 4' 10 (1.473 m)   Wt 64 kg   SpO2 92%   BMI 29.49 kg/m  , BMI Body mass index is 29.49 kg/m. GENERAL:  Well appearing.  Off BiPAP HEENT: Pupils equal round and reactive,  fundi not visualized, oral mucosa unremarkable NECK:  No jugular venous distention, waveform within normal limits, carotid upstroke brisk and symmetric, no bruits, no thyromegaly LUNGS: Diminished coarse breath sounds HEART:  Irregularly irregular  PMI not displaced or sustained,S1 and S2 within normal limits, no S3, no S4, no clicks, no rubs, no murmurs ABD:  Flat, positive bowel sounds normal in frequency in pitch, no bruits, no rebound, no guarding, no midline pulsatile mass, no hepatomegaly, no splenomegaly EXT:  2 plus pulses throughout, 1+ LE edema, no cyanosis no clubbing SKIN:  No rashes no nodules NEURO:  Cranial nerves II through XII grossly intact, motor grossly intact throughout   Labs High Sensitivity Troponin:  No results for input(s): TROPONINIHS in the last 720 hours.  Recent Labs  Lab 03/25/24 1915 03/25/24 2115  TRNPT 47* 47*       Chemistry Recent Labs  Lab 03/25/24 1915 03/26/24 0440  NA 136 136  K 3.6 3.5  CL 99 101  CO2 26 25  GLUCOSE 106* 107*  BUN  8 6*  CREATININE 0.65 0.56  CALCIUM  8.6* 7.9*  MG  --  1.5*  PROT 6.3*  --   ALBUMIN  3.1*  --   AST 22  --   ALT 33  --   ALKPHOS 87  --   BILITOT 1.2  --   GFRNONAA >60 >60  ANIONGAP 11 10    Lipids No results for input(s): CHOL, TRIG, HDL, LABVLDL, LDLCALC, CHOLHDL in the last 168 hours.  Hematology Recent Labs  Lab 03/25/24 1915 03/26/24 0440  WBC 15.0* 14.4*  RBC 3.45* 3.19*  HGB 11.0* 10.0*  HCT 33.5* 31.0*  MCV 97.1 97.2  MCH 31.9 31.3  MCHC 32.8 32.3  RDW 15.2 15.3  PLT 158 149*   Thyroid  No results for input(s): TSH, FREET4 in the last 168 hours.  BNP Recent Labs  Lab 03/26/24 1912  PROBNP 8,099.0*    DDimer No results for input(s): DDIMER in the last 168 hours.   Radiology  DG CHEST PORT 1 VIEW Result Date: 03/26/2024 CLINICAL DATA:  Short of breath, pleural effusion EXAM: PORTABLE CHEST 1 VIEW COMPARISON:  03/25/2024 FINDINGS: Single frontal view of the  chest demonstrates stable enlargement of the cardiac silhouette. Calcification of the mitral annulus again noted. There is persistent central vascular prominence, with bilateral airspace disease greatest in the right upper and left lower lobes. Veiling opacity left lung base consistent with pleural effusion. No pneumothorax. IMPRESSION: 1. Persistent multifocal bilateral airspace disease which may reflect asymmetric edema or infection. 2. Stable left pleural effusion. 3. Stable enlarged cardiac silhouette. Electronically Signed   By: Ozell Daring M.D.   On: 03/26/2024 17:36   CT Angio Chest PE W and/or Wo Contrast Result Date: 03/25/2024 EXAM: CTA CHEST 03/25/2024 10:43:13 PM TECHNIQUE: CTA of the chest was performed after the administration of intravenous contrast. Multiplanar reformatted images are provided for review. MIP images are provided for review. Automated exposure control, iterative reconstruction, and/or weight based adjustment of the mA/kV was utilized to reduce the radiation dose to as low as reasonably achievable. COMPARISON: Same day CT chest. CLINICAL HISTORY: Pulmonary embolism (PE) suspected, high probability. Questionable sepsis FINDINGS: PULMONARY ARTERIES: Pulmonary arteries are adequately opacified for evaluation. No acute pulmonary embolus. Main pulmonary artery is normal in caliber. MEDIASTINUM: The heart and pericardium demonstrate no acute abnormality. There is no acute abnormality of the thoracic aorta. Coronary artery and aortic catheter sclerotic calcification. LYMPH NODES: Shotty mediastinal lymph nodes are likely reactive. LUNGS AND PLEURA: Small bilateral pleural effusions. Diffuse bronchial wall thickening. Interlobular septal thickening and patchy ground glass and consolidative opacities bilaterally. The appearance is suggestive of multifocal pneumonia. A component of pulmonary edema is not excluded. No pneumothorax. UPPER ABDOMEN: Limited images of the upper abdomen are  unremarkable. SOFT TISSUES AND BONES: No acute bone or soft tissue abnormality. IMPRESSION: 1. No pulmonary embolism. 2. Multifocal pneumonia. A component of pulmonary edema is not excluded. 3. Small bilateral pleural effusions. Electronically signed by: Norman Gatlin MD 03/25/2024 10:59 PM EST RP Workstation: [id number]   CT Chest W Contrast Result Date: 03/25/2024 EXAM: CT CHEST WITH CONTRAST 03/25/2024 09:03:49 PM TECHNIQUE: CT of the chest was performed with the administration of 50 mL of iohexol  (OMNIPAQUE ) 350 MG/ML injection. Multiplanar reformatted images are provided for review. Automated exposure control, iterative reconstruction, and/or weight based adjustment of the mA/kV was utilized to reduce the radiation dose to as low as reasonably achievable. COMPARISON: Comparison with same day x-ray and CT chest 08/25/2019. CLINICAL HISTORY: Pneumonia, complication suspected,  xray done. FINDINGS: MEDIASTINUM: Heart and pericardium are unremarkable. Coronary artery and aortic atherosclerotic calcification. The central airways are clear. LYMPH NODES: No mediastinal, hilar or axillary lymphadenopathy. LUNGS AND PLEURA: Small bilateral pleural effusions. Compressive atelectasis. Diffuse interlobular septal thickening and patchy ground glass and consolidation. The distribution suggests multifocal pneumonia. No pneumothorax. SOFT TISSUES/BONES: No acute abnormality of the bones or soft tissues. UPPER ABDOMEN: Limited images of the upper abdomen demonstrates no acute abnormality. IMPRESSION: 1. Multifocal pneumonia. 2. Small bilateral pleural effusions. Electronically signed by: Norman Gatlin MD 03/25/2024 09:14 PM EST RP Workstation: [id number]   DG Chest Port 1 View Result Date: 03/25/2024 EXAM: 1 VIEW(S) XRAY OF THE CHEST 03/25/2024 07:20:17 PM COMPARISON: 03/08/2024 CLINICAL HISTORY: Questionable sepsis; evaluate for abnormality. FINDINGS: LUNGS AND PLEURA: Low lung volumes. Persistent confluent opacities in  right upper lung and left lung base. Diffuse interstitial prominence. Unchanged blunting of left costophrenic angle. No pneumothorax. HEART AND MEDIASTINUM: No acute abnormality of the cardiac and mediastinal silhouettes. BONES AND SOFT TISSUES: No acute osseous abnormality. IMPRESSION: 1. Persistent confluent opacities in the right upper lung and left lung base. 2. Unchanged blunting of the left costophrenic angle. Electronically signed by: Greig Pique MD 03/25/2024 07:24 PM EST RP Workstation: [id number]    Cardiac Studies Echo 03/28/22: 1. Left ventricular ejection fraction, by estimation, is 55 to 60%. The  left ventricle has normal function. The left ventricle has no regional  wall motion abnormalities. Left ventricular diastolic parameters are  indeterminate.   2. Right ventricular systolic function is normal. The right ventricular  size is normal.   3. Left atrial size was severely dilated.   4. The mitral valve is normal in structure. Mild mitral valve  regurgitation. No evidence of mitral stenosis.   5. Tricuspid valve regurgitation is mild to moderate.   6. The aortic valve is normal in structure. Aortic valve regurgitation is  not visualized. No aortic stenosis is present.   7. The inferior vena cava is normal in size with greater than 50%  respiratory variability, suggesting right atrial pressure of 3 mmHg.   Patient Profile   46F with permanent atrial for patient, diabetes, hypertension, hyperlipidemia, and HFpEF admitted with pneumonia.  Cardiology consulted for atrial fibrillation with RVR.   Assessment & Plan   # Atrial fibrillation with RVR:  Ms. Vukelich has chronic atrial fibrillation.  Rate are improving on metoprolol .  Her dose was increased to 25 to 50 mg.  She did not receive her dose this morning due to low blood pressure.  Will transition to metoprolol  12.5 mg twice daily to tartrate and consolidate again prior to discharge.  Continue Eliquis .  # Sepsis:  #  Multifocal PNA:  # Recent COVID019:  Now off BiPAP and improving.  Antibiotic management per pulmonary and primary team.  Blood pressures labile but overall she is tolerating metoprolol .  # HFpEF:  Volume status stable.  Hold on additional diuresis given her low blood pressure for now.  # Hyperlipidemia; Continue pravastatin .  For questions or updates, please contact [facility] Please consult [email or web address] for contact info under       Signed, Annabella Scarce, MD  03/27/2024, 11:04 AM    "

## 2024-03-28 ENCOUNTER — Inpatient Hospital Stay (HOSPITAL_COMMUNITY)

## 2024-03-28 DIAGNOSIS — J9601 Acute respiratory failure with hypoxia: Secondary | ICD-10-CM

## 2024-03-28 LAB — CBC
HCT: 26.5 % — ABNORMAL LOW (ref 36.0–46.0)
HCT: 27.3 % — ABNORMAL LOW (ref 36.0–46.0)
Hemoglobin: 8.8 g/dL — ABNORMAL LOW (ref 12.0–15.0)
Hemoglobin: 9 g/dL — ABNORMAL LOW (ref 12.0–15.0)
MCH: 32.1 pg (ref 26.0–34.0)
MCH: 31.8 pg (ref 26.0–34.0)
MCHC: 33.2 g/dL (ref 30.0–36.0)
MCHC: 33 g/dL (ref 30.0–36.0)
MCV: 96.7 fL (ref 80.0–100.0)
MCV: 96.5 fL (ref 80.0–100.0)
Platelets: 154 10*3/uL (ref 150–400)
Platelets: 161 10*3/uL (ref 150–400)
RBC: 2.74 MIL/uL — ABNORMAL LOW (ref 3.87–5.11)
RBC: 2.83 MIL/uL — ABNORMAL LOW (ref 3.87–5.11)
RDW: 15 % (ref 11.5–15.5)
RDW: 14.7 % (ref 11.5–15.5)
WBC: 12.3 10*3/uL — ABNORMAL HIGH (ref 4.0–10.5)
WBC: 11.6 10*3/uL — ABNORMAL HIGH (ref 4.0–10.5)
nRBC: 0 % (ref 0.0–0.2)
nRBC: 0 % (ref 0.0–0.2)

## 2024-03-28 LAB — BASIC METABOLIC PANEL WITH GFR
Anion gap: 9 (ref 5–15)
BUN: 15 mg/dL (ref 8–23)
CO2: 26 mmol/L (ref 22–32)
Calcium: 8.3 mg/dL — ABNORMAL LOW (ref 8.9–10.3)
Chloride: 101 mmol/L (ref 98–111)
Creatinine, Ser: 0.77 mg/dL (ref 0.44–1.00)
GFR, Estimated: >60 mL/min
Glucose, Bld: 80 mg/dL (ref 70–99)
Potassium: 3.1 mmol/L — ABNORMAL LOW (ref 3.5–5.1)
Sodium: 136 mmol/L (ref 135–145)

## 2024-03-28 LAB — ECHOCARDIOGRAM COMPLETE
Area-P 1/2: 3.74 cm2
Height: 58 in
S' Lateral: 2.5 cm
Weight: 2257.51 [oz_av]

## 2024-03-28 LAB — DIFFERENTIAL
Abs Immature Granulocytes: 0.1 10*3/uL — ABNORMAL HIGH (ref 0.00–0.07)
Basophils Absolute: 0 10*3/uL (ref 0.0–0.1)
Basophils Relative: 0 %
Eosinophils Absolute: 0.3 10*3/uL (ref 0.0–0.5)
Eosinophils Relative: 3 %
Immature Granulocytes: 1 %
Lymphocytes Relative: 14 %
Lymphs Abs: 1.7 10*3/uL (ref 0.7–4.0)
Monocytes Absolute: 0.7 10*3/uL (ref 0.1–1.0)
Monocytes Relative: 6 %
Neutro Abs: 8.8 10*3/uL — ABNORMAL HIGH (ref 1.7–7.7)
Neutrophils Relative %: 76 %

## 2024-03-28 LAB — RETICULOCYTES
Immature Retic Fract: 22.9 % — ABNORMAL HIGH (ref 2.3–15.9)
RBC.: 2.84 MIL/uL — ABNORMAL LOW (ref 3.87–5.11)
Retic Count, Absolute: 63 10*3/uL (ref 19.0–186.0)
Retic Ct Pct: 2.2 % (ref 0.4–3.1)

## 2024-03-28 LAB — GLUCOSE, CAPILLARY
Glucose-Capillary: 84 mg/dL (ref 70–99)
Glucose-Capillary: 113 mg/dL — ABNORMAL HIGH (ref 70–99)
Glucose-Capillary: 149 mg/dL — ABNORMAL HIGH (ref 70–99)
Glucose-Capillary: 174 mg/dL — ABNORMAL HIGH (ref 70–99)

## 2024-03-28 LAB — TECHNOLOGIST SMEAR REVIEW: Plt Morphology: NORMAL

## 2024-03-28 LAB — IRON AND TIBC
Iron: 25 ug/dL — ABNORMAL LOW (ref 28–170)
Saturation Ratios: 16 % (ref 10.4–31.8)
TIBC: 157 ug/dL — ABNORMAL LOW (ref 250–450)
UIBC: 132 ug/dL

## 2024-03-28 LAB — FOLATE: Folate: 8.7 ng/mL

## 2024-03-28 LAB — FIBRINOGEN: Fibrinogen: 775 mg/dL — ABNORMAL HIGH (ref 210–475)

## 2024-03-28 LAB — TYPE AND SCREEN
ABO/RH(D): A POS
Antibody Screen: NEGATIVE

## 2024-03-28 LAB — VITAMIN B12: Vitamin B-12: 548 pg/mL (ref 180–914)

## 2024-03-28 LAB — FERRITIN: Ferritin: 1503 ng/mL — ABNORMAL HIGH (ref 11–307)

## 2024-03-28 LAB — MAGNESIUM: Magnesium: 2.2 mg/dL (ref 1.7–2.4)

## 2024-03-28 MED ORDER — B COMPLEX-C PO TABS
1.0000 | ORAL_TABLET | Freq: Every day | ORAL | Status: AC
  Administered 2024-03-28 – 2024-03-29 (×2): 1 via ORAL
  Filled 2024-03-28 (×2): qty 1

## 2024-03-28 MED ORDER — AMOXICILLIN-POT CLAVULANATE 875-125 MG PO TABS
1.0000 | ORAL_TABLET | Freq: Two times a day (BID) | ORAL | Status: AC
  Administered 2024-03-28 – 2024-03-29 (×3): 1 via ORAL
  Filled 2024-03-28 (×4): qty 1

## 2024-03-28 MED ORDER — FERROUS SULFATE 325 (65 FE) MG PO TABS
325.0000 mg | ORAL_TABLET | Freq: Every day | ORAL | Status: AC
  Administered 2024-03-29: 325 mg via ORAL
  Filled 2024-03-28: qty 1

## 2024-03-28 MED ORDER — APIXABAN 2.5 MG PO TABS
2.5000 mg | ORAL_TABLET | Freq: Two times a day (BID) | ORAL | Status: AC
  Administered 2024-03-29 (×2): 2.5 mg via ORAL
  Filled 2024-03-28 (×2): qty 1

## 2024-03-28 MED ORDER — POTASSIUM CHLORIDE CRYS ER 20 MEQ PO TBCR
40.0000 meq | EXTENDED_RELEASE_TABLET | ORAL | Status: AC
  Administered 2024-03-28 (×2): 40 meq via ORAL
  Filled 2024-03-28 (×2): qty 2

## 2024-03-28 MED ORDER — PANTOPRAZOLE SODIUM 40 MG PO TBEC
40.0000 mg | DELAYED_RELEASE_TABLET | Freq: Every day | ORAL | Status: AC
  Administered 2024-03-28 – 2024-03-29 (×2): 40 mg via ORAL
  Filled 2024-03-28 (×2): qty 1

## 2024-03-28 NOTE — Plan of Care (Signed)
 Patient is progressing towards goals of care.     Problem: Fluid Volume: Goal: Hemodynamic stability will improve Outcome: Progressing   Problem: Clinical Measurements: Goal: Diagnostic test results will improve Outcome: Progressing Goal: Signs and symptoms of infection will decrease Outcome: Progressing   Problem: Respiratory: Goal: Ability to maintain adequate ventilation will improve Outcome: Progressing   Problem: Education: Goal: Ability to describe self-care measures that may prevent or decrease complications (Diabetes Survival Skills Education) will improve Outcome: Progressing Goal: Individualized Educational Video(s) Outcome: Progressing   Problem: Coping: Goal: Ability to adjust to condition or change in health will improve Outcome: Progressing   Problem: Fluid Volume: Goal: Ability to maintain a balanced intake and output will improve Outcome: Progressing   Problem: Health Behavior/Discharge Planning: Goal: Ability to identify and utilize available resources and services will improve Outcome: Progressing Goal: Ability to manage health-related needs will improve Outcome: Progressing   Problem: Metabolic: Goal: Ability to maintain appropriate glucose levels will improve Outcome: Progressing   Problem: Nutritional: Goal: Maintenance of adequate nutrition will improve Outcome: Progressing Goal: Progress toward achieving an optimal weight will improve Outcome: Progressing   Problem: Skin Integrity: Goal: Risk for impaired skin integrity will decrease Outcome: Progressing   Problem: Tissue Perfusion: Goal: Adequacy of tissue perfusion will improve Outcome: Progressing   Problem: Education: Goal: Knowledge of General Education information will improve Description: Including pain rating scale, medication(s)/side effects and non-pharmacologic comfort measures Outcome: Progressing   Problem: Health Behavior/Discharge Planning: Goal: Ability to manage  health-related needs will improve Outcome: Progressing   Problem: Clinical Measurements: Goal: Ability to maintain clinical measurements within normal limits will improve Outcome: Progressing Goal: Will remain free from infection Outcome: Progressing Goal: Diagnostic test results will improve Outcome: Progressing Goal: Respiratory complications will improve Outcome: Progressing Goal: Cardiovascular complication will be avoided Outcome: Progressing   Problem: Activity: Goal: Risk for activity intolerance will decrease Outcome: Progressing   Problem: Nutrition: Goal: Adequate nutrition will be maintained Outcome: Progressing   Problem: Coping: Goal: Level of anxiety will decrease Outcome: Progressing   Problem: Elimination: Goal: Will not experience complications related to bowel motility Outcome: Progressing Goal: Will not experience complications related to urinary retention Outcome: Progressing   Problem: Pain Managment: Goal: General experience of comfort will improve and/or be controlled Outcome: Progressing   Problem: Safety: Goal: Ability to remain free from injury will improve Outcome: Progressing   Problem: Skin Integrity: Goal: Risk for impaired skin integrity will decrease Outcome: Progressing

## 2024-03-28 NOTE — Plan of Care (Signed)
" °  Problem: Clinical Measurements: Goal: Diagnostic test results will improve Outcome: Progressing   Problem: Clinical Measurements: Goal: Signs and symptoms of infection will decrease Outcome: Progressing   Problem: Respiratory: Goal: Ability to maintain adequate ventilation will improve Outcome: Progressing   Problem: Coping: Goal: Ability to adjust to condition or change in health will improve Outcome: Progressing   Problem: Health Behavior/Discharge Planning: Goal: Ability to identify and utilize available resources and services will improve Outcome: Progressing   "

## 2024-03-28 NOTE — Progress Notes (Signed)
 [facility] Progress Note Patient: Dana Bradford FMW:[id number] DOB: 03-14-1930  DOA: 03/25/2024 DOS: the patient was seen and examined on 03/28/2024  Brief Summary: Patient with PMH of T2DM, chronic A-fib HFpEF, CKD 3B, HTN, HLD, GERD, RLS, breast cancer, anxiety, depression, hypothyroidism presented to the hospital with complaints of shortness of breath.   Significant events: 1/8 - 1/13 >> admitted for COVID-19 infection.  Treated with steroid.  Lasix  was changed from scheduled to as needed and Zaroxolyn  was held. 1/16 - 1/24>> readmitted for hypoxia.  Concern for bacterial pneumonia.  Treated with antibiotics. 2/2>> brought from [facility] SNF with fever 100.7 2/3>> cardiology was consulted for A-fib with RVR.  Pulmonary was consulted due to recurrent presentation with pneumonia like symptoms. Consults: Cardiology Pulmonary   Assessment and plan: Sepsis secondary to recurrent multifocal pneumonia. Acute hypoxic respiratory failure. Required BiPAP initially Met SIRS criteria upon admission with fever, tachycardia, tachypnea and leukocytosis. Chest x-ray showing possibility of a multifocal pneumonia. Mildly hypoxic upon admission as well. Started on IV antibiotics cefepime  and azithromycin . Procalcitonin 0.24.  Troponin minimally elevated.  proBNP elevated.  VBG showed hypoxia.  Without any hypercarbia. CT PE protocol negative for PE. Pulmonary was also consulted. Treated with IV antibiotic.  Blood cultures negative.  Switching to oral antibiotic for now. Completed course of azithromycin . Currently no indication for steroid use.  Steroids added still for treatment of gout.   Chronic A-fib with RVR. Cardiology consulted.  Has chronic A-fib. On metoprolol . On Eliquis . Monitor on telemetry.   Chronic HFpEF. Appears to be euvolemic. BNP was elevated. Blood pressure is also relatively soft. Was on Lasix  and Zaroxolyn .  Currently medications on hold. Will recheck BNP levels  tomorrow.  May need albumin  and midodrine combination to support IV diuresis.   Hypomagnesemia. Corrected.   HTN. On metoprolol  and hydralazine  but Currently metoprolol . Monitor.   Hypothyroidism. Continue Synthroid . Cortisol level WNL.   HLD. Continuing statin.   Type 2 diabetes mellitus, well-controlled without long-term insulin  use. A1c 6.2. Currently on sliding scale insulin .   History of CVA. On statin and Eliquis .   History of gout with recent exacerbation. Appears that the right upper extremity has gout again. On colchicine .  Added prednisone . Continue.   History of breast cancer status postlumpectomy. Outpatient follow-up with oncology.   Restless leg syndrome. Home regimen includes pramipexole  and gabapentin . For now continue.   Right heel pressure injury. Stage III. Present on admission. Continue wound care.    Code Status: Limited: Do not attempt resuscitation (DNR) -DNR-LIMITED -Do Not Intubate/DNI    DVT Prophylaxis: apixaban  (ELIQUIS ) tablet 2.5 mg Start: 03/29/24 1000 apixaban  (ELIQUIS ) tablet 2.5 mg   Data review I have Reviewed nursing notes, Vitals, and Lab results. Since last encounter, pertinent lab results CBC BMP   . I have ordered test including CBC BMP  .  Physical exam. Vitals:   03/28/24 0045 03/28/24 0404 03/28/24 0806 03/28/24 1121  BP:  102/89    Pulse:  86    Resp:  16    Temp:  98 F (36.7 C) (!) 97.4 F (36.3 C) 97.6 F (36.4 C)  TempSrc:  Axillary Oral Oral  SpO2: 96% 97%    Weight:      Height:      Unchanged swelling of the right upper extremity with limitation of the range of motion secondary to pain. S1-S2 present.  Area.  Regular. Appears to be swollen in the legs as well as in the upper extremity on the left  as well. Basal crackles.  Subjective: Breathing better.  No nausea no vomiting.  Still fatigue and tiredness as well as swelling of and pain of the right arm.  Family Communication: Family at  bedside.  Disposition Plan: Status is: Inpatient Remains inpatient appropriate because: Monitor for improvement in pain and volume control.   Planned Discharge Destination: SNF Diet: Diet Order             Diet Carb Modified Room service appropriate? Yes  Diet effective now                   MEDICATIONS: Scheduled Meds:  amoxicillin -clavulanate  1 tablet Oral Q12H   [START ON 03/29/2024] apixaban   2.5 mg Oral BID   ascorbic acid   1,000 mg Oral Once per day on Monday Wednesday Friday   B-complex with vitamin C   1 tablet Oral Daily   cholecalciferol   2,000 Units Oral Daily   colchicine   0.6 mg Oral Daily   feeding supplement  237 mL Oral BID BM   [START ON 03/29/2024] ferrous sulfate   325 mg Oral Q breakfast   gabapentin   100 mg Oral TID   insulin  aspart  0-6 Units Subcutaneous TID WC   levothyroxine   88 mcg Oral QAC breakfast   metoprolol  tartrate  12.5 mg Oral BID   pantoprazole   40 mg Oral Daily   pramipexole   1 mg Oral QHS   pravastatin   20 mg Oral q1800   predniSONE   40 mg Oral Q breakfast   Continuous Infusions:  liver oil-zinc  oxide     PRN Meds:.acetaminophen  **OR** acetaminophen , liver oil-zinc  oxide, magnesium  hydroxide, melatonin, ondansetron  **OR** ondansetron  (ZOFRAN ) IV, traMADol   Author: Yetta Blanch, MD  [facility] 03/28/2024  7:51 PM Between 7PM-7AM, please contact night-coverage, check [email or web address] for on call.

## 2024-03-28 NOTE — Plan of Care (Signed)
   Problem: Fluid Volume: Goal: Hemodynamic stability will improve Outcome: Progressing   Problem: Clinical Measurements: Goal: Diagnostic test results will improve Outcome: Progressing Goal: Signs and symptoms of infection will decrease Outcome: Progressing   Problem: Respiratory: Goal: Ability to maintain adequate ventilation will improve Outcome: Progressing

## 2024-03-28 NOTE — Progress Notes (Signed)
 Echocardiogram 2D Echocardiogram has been performed.  Dana Bradford 03/28/2024, 12:23 PM

## 2024-03-28 NOTE — Evaluation (Signed)
 Occupational Therapy Evaluation Patient Details Name: Dana Bradford MRN: [id number] DOB: [birth year] Today's Date: 03/28/2024   History of Present Illness   89 y.o. female presents 03/25/24 from [facility] with shortness of breath. Chest x-ray showing possibility of a multifocal pneumonia, treated with additional abx. CT PE protocol negative for PE. Cardiology was consulted for A-fib with RVR. Pt with recent admissions 1/8-1/13 for COVID-19 infection and 1/16-1/24 for hypoxia. PMH:  T2DM, chronic A-fib HFpEF, CKD 3B, HTN, HLD, GERD, RLS, breast cancer, anxiety, depression, gout, hypothyroidism.     Clinical Impressions Pt presents from SNF where she was receiving assistance to transfer to and mobilize with manual w/c, as well as complete ADL tasks. Pt declined all mobility attempts this session d/t pain in RUE. Pt currently requires up to total A for ADL tasks bed level. Pt is primarily limited by generalized weakness, pain, decreased activity tolerance, and anxiety. OT to continue to follow Pt acutely to facilitate progress towards goals. Patient will benefit from continued inpatient follow up therapy, <3 hours/day with return to SNF. Pt and family expressing desire to return to [facility].      If plan is discharge home, recommend the following:   Two people to help with walking and/or transfers;Two people to help with bathing/dressing/bathroom;Assistance with cooking/housework;Assistance with feeding;Direct supervision/assist for medications management;Assist for transportation;Help with stairs or ramp for entrance     Functional Status Assessment   Patient has had a recent decline in their functional status and/or demonstrates limited ability to make significant improvements in function in a reasonable and predictable amount of time     Equipment Recommendations   Other (comment) (defer next venue)     Recommendations for Other Services         Precautions/Restrictions    Precautions Precautions: Fall Recall of Precautions/Restrictions: Intact Precaution/Restrictions Comments: Pt with increased fear of falling Restrictions Weight Bearing Restrictions Per Provider Order: No     Mobility Bed Mobility               General bed mobility comments: Pt declined bed mobility this date. Per discussion with student nurse, Pt required Max A roll.    Transfers                   General transfer comment: Pt declining mobility this date      Balance                                           ADL either performed or assessed with clinical judgement   ADL Overall ADL's : Needs assistance/impaired Eating/Feeding: Moderate assistance;Bed level Eating/Feeding Details (indicate cue type and reason): Pt reports unable to feed self d/t RUE pain. Unable to place food on utensils with LUE without Mod A. Grooming: Moderate assistance;Bed level   Upper Body Bathing: Total assistance;Bed level   Lower Body Bathing: Total assistance;Bed level   Upper Body Dressing : Maximal assistance;Bed level   Lower Body Dressing: Total assistance;Bed level   Toilet Transfer: Total assistance Toilet Transfer Details (indicate cue type and reason): Currently would require bed pan Toileting- Clothing Manipulation and Hygiene: Total assistance               Vision Patient Visual Report: No change from baseline       Perception         Praxis  Pertinent Vitals/Pain Pain Assessment Pain Assessment: Faces Faces Pain Scale: Hurts worst Pain Location: RUE Pain Descriptors / Indicators: Discomfort, Grimacing, Guarding, Moaning Pain Intervention(s): Limited activity within patient's tolerance, Monitored during session, Repositioned     Extremity/Trunk Assessment Upper Extremity Assessment Upper Extremity Assessment: Generalized weakness;RUE deficits/detail;Right hand dominant RUE Deficits / Details: Decreased shoulder, elbow,  wrist, and digit ROM. Signficant edema noted on evaluation. Pt reports pain limiting movement. Pt able to complete ~15 degrees shoulder flexion and ~45 degrees elbow felxion AAROM with support from LUE. RUE: Unable to fully assess due to pain RUE Coordination: decreased fine motor;decreased gross motor   Lower Extremity Assessment Lower Extremity Assessment: Defer to PT evaluation       Communication Communication Communication: Impaired Factors Affecting Communication: Hearing impaired   Cognition Arousal: Alert Behavior During Therapy: Anxious Cognition: History of cognitive impairments, Cognition impaired     Awareness: Online awareness impaired, Intellectual awareness intact     Executive functioning impairment (select all impairments): Reasoning, Problem solving OT - Cognition Comments: Pt with delayed processing, requires repeated instruction and simplification of tasks to one-step commands. Pt with increased anxiety related to mobility, self-limiting behaviors. Decreased insight into limitations.                 Following commands: Impaired Following commands impaired: Follows one step commands inconsistently, Follows one step commands with increased time     Cueing  General Comments   Cueing Techniques: Verbal cues;Visual cues;Tactile cues  Family present at end of session, express concern related to Pt current level of abilities. Pt and family express desire to return to SNF. Positioined RUE on pillow elevated to assist with edema management strategies   Exercises     Shoulder Instructions      Home Living Family/patient expects to be discharged to:: Skilled nursing facility                                        Prior Functioning/Environment Prior Level of Function : Needs assist             Mobility Comments: Pt reports assistance to stand pivot to manual w/c at SNF, unable to propel w/c independently. ADLs Comments: Reports she  has been receiving assist for all BADL tasks at SNF.    OT Problem List: Decreased strength;Decreased range of motion;Decreased activity tolerance;Impaired balance (sitting and/or standing);Decreased cognition;Decreased safety awareness;Decreased knowledge of use of DME or AE;Decreased knowledge of precautions;Pain;Impaired UE functional use   OT Treatment/Interventions: Self-care/ADL training;Therapeutic exercise;Energy conservation;DME and/or AE instruction;Therapeutic activities;Patient/family education;Balance training      OT Goals(Current goals can be found in the care plan section)   Acute Rehab OT Goals Patient Stated Goal: To get better OT Goal Formulation: With patient Time For Goal Achievement: 04/11/24 Potential to Achieve Goals: Fair ADL Goals Pt Will Perform Eating: with set-up;bed level;with adaptive utensils Pt Will Perform Grooming: with set-up;sitting;bed level Pt Will Perform Upper Body Dressing: with min assist;sitting Pt Will Perform Lower Body Dressing: with mod assist;sitting/lateral leans Pt/caregiver will Perform Home Exercise Program: Increased ROM;With written HEP provided;Right Upper extremity (begin gentle ROM, edema management)   OT Frequency:  Min 1X/week    Co-evaluation              AM-PAC OT 6 Clicks Daily Activity     Outcome Measure Help from another person eating meals?: A Lot Help from another  person taking care of personal grooming?: A Lot Help from another person toileting, which includes using toliet, bedpan, or urinal?: Total Help from another person bathing (including washing, rinsing, drying)?: Total Help from another person to put on and taking off regular upper body clothing?: A Lot Help from another person to put on and taking off regular lower body clothing?: Total 6 Click Score: 9   End of Session    Activity Tolerance: Patient limited by pain Patient left: in bed;with call bell/phone within reach;with bed alarm set;with  family/visitor present  OT Visit Diagnosis: Muscle weakness (generalized) (M62.81);Other abnormalities of gait and mobility (R26.89);Pain                Time: 8389-8368 OT Time Calculation (min): 21 min Charges:  OT General Charges $OT Visit: 1 Visit OT Evaluation $OT Eval Low Complexity: 1 Low  Maurilio CROME, OTR/L.  [facility]  Office: [phone]   Maurilio PARAS Amberia Bayless 03/28/2024, 4:58 PM

## 2024-03-28 NOTE — Care Management Important Message (Signed)
 Important Message  Patient Details  Name: Dana Bradford MRN: [id number] Date of Birth: March 31, 1930   Important Message Given:  Yes - Medicare IM     Claretta Deed 03/28/2024, 2:43 PM

## 2024-03-28 NOTE — Progress Notes (Signed)
 Patient is due to void- two bladder scans have been performed this shift- went in with NT to perform Int. Cath. Family and patient were eating-requested Int. Cath. After she completed dinner

## 2024-03-28 NOTE — Progress Notes (Addendum)
 " Progress Note  Patient Name: Dana Bradford Date of Encounter: 03/28/2024  Primary Cardiologist: Annabella Scarce, MD  Subjective   Awoken from sleep, denies any CP, palpitations or SOB. HR 70s-90s primarily. SBP 99/78. Denies s/sx of bleeding. Hgb down to 8.8 today.  Inpatient Medications    Scheduled Meds:  apixaban   2.5 mg Oral BID   ascorbic acid   1,000 mg Oral Once per day on Monday Wednesday Friday   cholecalciferol   2,000 Units Oral Daily   colchicine   0.6 mg Oral Daily   feeding supplement  237 mL Oral BID BM   gabapentin   100 mg Oral TID   insulin  aspart  0-6 Units Subcutaneous TID WC   levothyroxine   88 mcg Oral QAC breakfast   metoprolol  tartrate  12.5 mg Oral BID   pramipexole   1 mg Oral QHS   pravastatin   20 mg Oral q1800   predniSONE   40 mg Oral Q breakfast   Continuous Infusions:  ceFEPime  (MAXIPIME ) IV 2 g (03/28/24 0528)   liver oil-zinc  oxide     PRN Meds: acetaminophen  **OR** acetaminophen , liver oil-zinc  oxide, magnesium  hydroxide, melatonin, ondansetron  **OR** ondansetron  (ZOFRAN ) IV, traMADol    Vital Signs    Vitals:   03/28/24 0000 03/28/24 0043 03/28/24 0045 03/28/24 0404  BP: 102/75   102/89  Pulse: (!) 127 (!) 109  86  Resp: 19 (!) 24  16  Temp: 98.8 F (37.1 C)   98 F (36.7 C)  TempSrc: Oral   Axillary  SpO2: 91%  96% 97%  Weight:      Height:        Intake/Output Summary (Last 24 hours) at 03/28/2024 0756 Last data filed at 03/28/2024 0655 Gross per 24 hour  Intake --  Output 881 ml  Net -881 ml      03/25/2024    6:36 PM 03/08/2024   10:23 PM 03/01/2024    2:13 PM  Last 3 Weights  Weight (lbs) 141 lb 1.5 oz 139 lb 12.4 oz 141 lb 12.1 oz  Weight (kg) 64 kg 63.4 kg 64.3 kg     Telemetry    AF CVR primarily - Personally Reviewed  Physical Exam   GEN: No acute distress.  HEENT: Normocephalic, atraumatic, sclera non-icteric. Neck: No JVD or bruits. Cardiac: Irr irregular, rate controlled, no murmurs, rubs, or gallops.   Respiratory: Diffusely diminished to auscultation bilaterally. Breathing is unlabored. GI: Soft, nontender, non-distended, BS +x 4. MS: no deformity. Extremities: No clubbing or cyanosis. No edema. Distal pedal pulses are 2+ and equal bilaterally. Neuro:  A+Ox to place, self, February but not year. Follows commands. Psych:  Responds to questions appropriately with a normal affect.  Labs    High Sensitivity Troponin:  No results for input(s): TROPONINIHS in the last 720 hours.    Cardiac EnzymesNo results for input(s): TROPONINI in the last 168 hours. No results for input(s): TROPIPOC in the last 168 hours.   Chemistry Recent Labs  Lab 03/25/24 1915 03/26/24 0440 03/28/24 0238  NA 136 136 136  K 3.6 3.5 3.1*  CL 99 101 101  CO2 26 25 26   GLUCOSE 106* 107* 80  BUN 8 6* 15  CREATININE 0.65 0.56 0.77  CALCIUM  8.6* 7.9* 8.3*  PROT 6.3*  --   --   ALBUMIN  3.1*  --   --   AST 22  --   --   ALT 33  --   --   ALKPHOS 87  --   --  BILITOT 1.2  --   --   GFRNONAA >60 >60 >60  ANIONGAP 11 10 9      Hematology Recent Labs  Lab 03/25/24 1915 03/26/24 0440 03/28/24 0238  WBC 15.0* 14.4* 12.3*  RBC 3.45* 3.19* 2.74*  HGB 11.0* 10.0* 8.8*  HCT 33.5* 31.0* 26.5*  MCV 97.1 97.2 96.7  MCH 31.9 31.3 32.1  MCHC 32.8 32.3 33.2  RDW 15.2 15.3 15.0  PLT 158 149* 154    BNP Recent Labs  Lab 03/26/24 1912  PROBNP 8,099.0*     DDimer No results for input(s): DDIMER in the last 168 hours.   Radiology    DG Wrist Complete Right Result Date: 03/27/2024 CLINICAL DATA:  Right wrist pain and edema EXAM: RIGHT WRIST - COMPLETE 3+ VIEW COMPARISON:  03/01/2024 FINDINGS: Frontal, oblique, and lateral views of the right wrist are obtained. Bones are osteopenic. No acute fracture. There is severe osteoarthritis within the radial aspect of the carpus and first carpometacarpal joint. Stable subluxation at the first carpometacarpal joint. There is diffuse soft tissue swelling of the  forearm, wrist, and hand. The extensive joint space narrowing and osteophyte formation seen throughout the interphalangeal joints is again visualized on this study. Erosive changes about the fifth PIP joint are stable. IMPRESSION: 1. No acute displaced fracture. 2. Diffuse soft tissue swelling throughout the forearm, wrist, and hand. 3. Multifocal osteoarthritis as above. 4. Stable erosive changes at the fifth PIP joint, which could reflect sequela of crystalline or inflammatory arthropathy versus septic arthritis. Electronically Signed   By: Ozell Daring M.D.   On: 03/27/2024 20:05   DG CHEST PORT 1 VIEW Result Date: 03/26/2024 CLINICAL DATA:  Short of breath, pleural effusion EXAM: PORTABLE CHEST 1 VIEW COMPARISON:  03/25/2024 FINDINGS: Single frontal view of the chest demonstrates stable enlargement of the cardiac silhouette. Calcification of the mitral annulus again noted. There is persistent central vascular prominence, with bilateral airspace disease greatest in the right upper and left lower lobes. Veiling opacity left lung base consistent with pleural effusion. No pneumothorax. IMPRESSION: 1. Persistent multifocal bilateral airspace disease which may reflect asymmetric edema or infection. 2. Stable left pleural effusion. 3. Stable enlarged cardiac silhouette. Electronically Signed   By: Ozell Daring M.D.   On: 03/26/2024 17:36    Cardiac Studies   2D echo 03/2022  1. Left ventricular ejection fraction, by estimation, is 55 to 60%. The  left ventricle has normal function. The left ventricle has no regional  wall motion abnormalities. Left ventricular diastolic parameters are  indeterminate.   2. Right ventricular systolic function is normal. The right ventricular  size is normal.   3. Left atrial size was severely dilated.   4. The mitral valve is normal in structure. Mild mitral valve  regurgitation. No evidence of mitral stenosis.   5. Tricuspid valve regurgitation is mild to moderate.    6. The aortic valve is normal in structure. Aortic valve regurgitation is  not visualized. No aortic stenosis is present.   7. The inferior vena cava is normal in size with greater than 50%  respiratory variability, suggesting right atrial pressure of 3 mmHg.  Comparison(s): EF 55%, mild LVH, moderate MAC.   Patient Profile     89 y.o. 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 This is 3rd admission in January. Admitted 1/8-1/13/26 with Covid-19 URI c/b AF RVR (in setting of unable to tolerate oral meds) then bradycardia requiring of adjustment of metoprolol , metabolic encephalopathy, electrolyte disturbances. Readmitted 1/16-1/24/26 with continued complications with hypoxia, felt possibly viral vs superimposed bactereial PNA, treated with antiviral/antibiotics and O2. Readmitted 03/25/2024 with SOB and hypoxia with multifocal PNA, treated with additional abx. Cardiology following for AF RVR.  Assessment & Plan    1. Multifocal PNA and acute hypoxic respiratory failure with hypoxia in the setting of recent Covid infection - CTA neg for PE - abx per primary team - pro-cal still up/higher at 2.3 on 2/4 - episodically requiring HFNC, BiPAP  2. Permanent atrial fibrillation with RVR, rates worsened by underlying medical illness above - HR 70s-90s on metoprolol  12.5mg  BID - continue cautiously with tendency for low normal BP, would not escalate dose - no role for DCCV given permanent rhythm - recent TSH OK - remains on Eliquis  2.5mg  BID - as of 2/2 weight 64kg so technically qualifies for 5mg  BID dose but historical weights are lower, so favor continuation of lower dose for now with updated tracking of  weights. Note downtrending Hgb - if any concerns for bleeding may need to hold  3. Chronic HFpEF - previous outpatient regimen was Lasix  40mg  BID, metolazone  2.5mg  every 5 days in 12/2023, recently required adjustment in hospitalizations above - 2D echo 2024 EF 55-60%, normal RV, mild MR, mild-moderate TR -> will order repeat echo to ensure LVEF remaining stable given recent oxygenation and hypotension issues - low albumin  may also contribute to fluid retention, but volume status currently looks OK - legs are not edematous, supporting that prior edema likely due to venous insufficiency - continue to hold on diuresis given tendency for soft BP  4. HLD - continue pravastatin , can reconsider chronic therapy if GOC evolve - would not escalate therapy given concomitant colchicine   5. Progressive anemia - Hgb previously 12-13 range, now 8.8 this AM - further per primary team  6. Hypokalemia, hypomagnesemia - Mg improved - K 3.1-> replete 40meq KCl x 2 doses  7. Elevated troponin - low/flat at 47-47, felt due to demand ischemia, do not suspect ACS  For questions or updates, please contact [facility] Please consult [email or web address] for contact info under Cardiology/STEMI.  Signed, Raphael LOISE Bring, PA-C 03/28/2024, 7:56 AM    "

## 2024-03-29 LAB — BASIC METABOLIC PANEL WITH GFR
Anion gap: 9 (ref 5–15)
BUN: 14 mg/dL (ref 8–23)
CO2: 25 mmol/L (ref 22–32)
Calcium: 8.5 mg/dL — ABNORMAL LOW (ref 8.9–10.3)
Chloride: 102 mmol/L (ref 98–111)
Creatinine, Ser: 0.7 mg/dL (ref 0.44–1.00)
GFR, Estimated: >60 mL/min
Glucose, Bld: 128 mg/dL — ABNORMAL HIGH (ref 70–99)
Potassium: 3.6 mmol/L (ref 3.5–5.1)
Sodium: 135 mmol/L (ref 135–145)

## 2024-03-29 LAB — CBC
HCT: 28.1 % — ABNORMAL LOW (ref 36.0–46.0)
Hemoglobin: 9.4 g/dL — ABNORMAL LOW (ref 12.0–15.0)
MCH: 32.2 pg (ref 26.0–34.0)
MCHC: 33.5 g/dL (ref 30.0–36.0)
MCV: 96.2 fL (ref 80.0–100.0)
Platelets: 174 10*3/uL (ref 150–400)
RBC: 2.92 MIL/uL — ABNORMAL LOW (ref 3.87–5.11)
RDW: 14.4 % (ref 11.5–15.5)
WBC: 10.7 10*3/uL — ABNORMAL HIGH (ref 4.0–10.5)
nRBC: 0 % (ref 0.0–0.2)

## 2024-03-29 LAB — HEPATIC FUNCTION PANEL
ALT: 23 U/L (ref 0–44)
AST: 18 U/L (ref 15–41)
Albumin: 2.4 g/dL — ABNORMAL LOW (ref 3.5–5.0)
Alkaline Phosphatase: 90 U/L (ref 38–126)
Bilirubin, Direct: 0.2 mg/dL (ref 0.0–0.2)
Indirect Bilirubin: 0.2 mg/dL — ABNORMAL LOW (ref 0.3–0.9)
Total Bilirubin: 0.3 mg/dL (ref 0.0–1.2)
Total Protein: 5.7 g/dL — ABNORMAL LOW (ref 6.5–8.1)

## 2024-03-29 LAB — CULTURE, BLOOD (ROUTINE X 2)
Culture: NO GROWTH
Culture: NO GROWTH
Special Requests: ADEQUATE
Special Requests: ADEQUATE

## 2024-03-29 LAB — GLUCOSE, CAPILLARY
Glucose-Capillary: 109 mg/dL — ABNORMAL HIGH (ref 70–99)
Glucose-Capillary: 127 mg/dL — ABNORMAL HIGH (ref 70–99)
Glucose-Capillary: 171 mg/dL — ABNORMAL HIGH (ref 70–99)
Glucose-Capillary: 156 mg/dL — ABNORMAL HIGH (ref 70–99)

## 2024-03-29 LAB — PRO BRAIN NATRIURETIC PEPTIDE: Pro Brain Natriuretic Peptide: 6616 pg/mL — ABNORMAL HIGH

## 2024-03-29 LAB — HAPTOGLOBIN: Haptoglobin: 272 mg/dL (ref 41–333)

## 2024-03-29 LAB — MAGNESIUM: Magnesium: 2 mg/dL (ref 1.7–2.4)

## 2024-03-29 MED ORDER — CHLORHEXIDINE GLUCONATE CLOTH 2 % EX PADS
6.0000 | MEDICATED_PAD | Freq: Every day | CUTANEOUS | Status: AC
  Administered 2024-03-29: 6 via TOPICAL

## 2024-03-29 MED ORDER — GERHARDT'S BUTT CREAM
TOPICAL_CREAM | Freq: Every day | CUTANEOUS | Status: AC
  Filled 2024-03-29: qty 60

## 2024-03-29 NOTE — Progress Notes (Signed)
 [facility] Progress Note Patient: Dana Bradford FMW:[id number] DOB: 1930/05/07  DOA: 03/25/2024 DOS: the patient was seen and examined on 03/29/2024  Brief Summary: Patient with PMH of T2DM, chronic A-fib HFpEF, CKD 3B, HTN, HLD, GERD, RLS, breast cancer, anxiety, depression, hypothyroidism presented to the hospital with complaints of shortness of breath.   Significant events: 1/8 - 1/13 >> admitted for COVID-19 infection.  Treated with steroid.  Lasix  was changed from scheduled to as needed and Zaroxolyn  was held. 1/16 - 1/24>> readmitted for hypoxia.  Concern for bacterial pneumonia.  Treated with antibiotics. 2/2>> brought from [facility] SN[facility] with fever 100.7 2/3>> cardiology was consulted for A-fib with RVR.  Pulmonary was consulted due to recurrent presentation with pneumonia like symptoms. Consults: Cardiology Pulmonary   Assessment and plan: Sepsis secondary to recurrent multifocal pneumonia. Acute hypoxic respiratory failure. Required BiPAP initially Met SIRS criteria upon admission with fever, tachycardia, tachypnea and leukocytosis. Chest x-ray showing possibility of a multifocal pneumonia. Mildly hypoxic upon admission as well. Started on IV antibiotics cefepime  and azithromycin . Procalcitonin 0.24.  Troponin minimally elevated.  proBNP elevated.  VBG showed hypoxia.  Without any hypercarbia. CT PE protocol negative for PE. Pulmonary was also consulted. Treated with IV antibiotic.  Blood cultures negative.  Currently on oral antibiotic. Completed course of azithromycin . Currently no indication for steroid use.  Steroids added still for treatment of gout.   Chronic A-fib with RVR. Cardiology consulted.  Has chronic A-fib. On metoprolol . On Eliquis . Monitor on telemetry.   Chronic HFpEF. BNP was elevated.  But improving. I suspect patient is slightly overloaded but currently asymptomatic. Blood pressure is soft and therefore holding aggressive diuresis. Was on  Lasix  and Zaroxolyn .  Currently medications on hold. May need albumin  and midodrine combination to support IV diuresis.   Hypomagnesemia. Corrected.   HTN. On metoprolol  and hydralazine  but Currently metoprolol . Monitor.   Hypothyroidism. Continue Synthroid . Cortisol level WNL.   HLD. Continuing statin.   Type 2 diabetes mellitus, well-controlled without long-term insulin  use. A1c 6.2. Currently on sliding scale insulin .   History of CVA. On statin and Eliquis .   History of gout with recent exacerbation. Appears that the right upper extremity has gout again. On colchicine .  Added prednisone . Continue.   History of breast cancer status postlumpectomy. Outpatient follow-up with oncology.   Restless leg syndrome. Home regimen includes pramipexole  and gabapentin . For now continue.   Right heel pressure injury. Stage III. Present on admission. Continue wound care.  Urinary retention. Patient required to In-N-Out catheterization and retaining more than 400 again on third and therefore a Foley catheter is currently placed. Will discontinue gabapentin . Currently no other new or concerning medication so far.  Not a good candidate for initiation of Flomax. Will attempt voiding trial tomorrow.    Code Status: Limited: Do not attempt resuscitation (DNR) -DNR-LIMITED -Do Not Intubate/DNI    DVT Prophylaxis: apixaban  (ELIQUIS ) tablet 2.5 mg Start: 03/29/24 1000 apixaban  (ELIQUIS ) tablet 2.5 mg   Data review I have Reviewed nursing notes, Vitals, and Lab results. Since last encounter, pertinent lab results CBC and BMP   . I have ordered test including CBC and BMP  .  Physical exam. Vitals:   03/29/24 0500 03/29/24 0800 03/29/24 1314 03/29/24 1600  BP:  91/63  102/73  Pulse:  90 89 86  Resp:  18 20 16   Temp:  98 F (36.7 C) 98.5 F (36.9 C) 98.2 F (36.8 C)  TempSrc:  Oral Oral Oral  SpO2:  96%  98%  Weight: 65.1 kg     Height:      Basal crackles. Bowel sounds  present. Nontender. No edema of lower extremity. Right upper extremity edema still present but improving with strict warmth.  Subjective: Improving pain in right upper extremity.  No nausea no vomiting.    Family Communication: Family at bedside.  Disposition Plan: Status is: Inpatient Remains inpatient appropriate because: Monitor for improvement in rate control.   Planned Discharge Destination: SNF Diet: Diet Order             Diet Carb Modified Room service appropriate? Yes  Diet effective now                   MEDICATIONS: Scheduled Meds:  amoxicillin -clavulanate  1 tablet Oral Q12H   apixaban   2.5 mg Oral BID   ascorbic acid   1,000 mg Oral Once per day on Monday Wednesday Friday   B-complex with vitamin C   1 tablet Oral Daily   Chlorhexidine  Gluconate Cloth  6 each Topical Daily   cholecalciferol   2,000 Units Oral Daily   colchicine   0.6 mg Oral Daily   feeding supplement  237 mL Oral BID BM   ferrous sulfate   325 mg Oral Q breakfast   Gerhardt's butt cream   Topical Daily   insulin  aspart  0-6 Units Subcutaneous TID WC   levothyroxine   88 mcg Oral QAC breakfast   metoprolol  tartrate  12.5 mg Oral BID   pantoprazole   40 mg Oral Daily   pramipexole   1 mg Oral QHS   pravastatin   20 mg Oral q1800   predniSONE   40 mg Oral Q breakfast   Continuous Infusions:  liver oil-zinc  oxide     PRN Meds:.acetaminophen  **OR** acetaminophen , liver oil-zinc  oxide, magnesium  hydroxide, melatonin, ondansetron  **OR** ondansetron  (ZOFRAN ) IV, traMADol   Author: Yetta Blanch, MD  [facility] 03/29/2024  5:09 PM Between 7PM-7AM, please contact night-coverage, check [email or web address] for on call.

## 2024-03-29 NOTE — Progress Notes (Signed)
 Pt had 225 ml BSV at 1930 Pt refused I/O cath by nursing team, bedside MD's rec. And private phone chat with daughter. Pt had 275 ml BSV at 0600 Pt has not voiced concerns or discomfort from the bladder this shift.

## 2024-03-29 NOTE — Evaluation (Signed)
 Physical Therapy Evaluation Patient Details Name: Dana Bradford MRN: [id number] DOB: Jan 03, 1931 Today's Date: 03/29/2024  History of Present Illness  89 y.o. female presents 03/25/24 from [facility] with shortness of breath. Chest x-ray showing possibility of a multifocal pneumonia, treated with additional abx. CT PE protocol negative for PE. Cardiology was consulted for A-fib with RVR. Pt with recent admissions 1/8-1/13 for COVID-19 infection and 1/16-1/24 for hypoxia. PMH:  T2DM, chronic A-fib HFpEF, CKD 3B, HTN, HLD, GERD, RLS, breast cancer, anxiety, depression, gout, hypothyroidism.   Clinical Impression  Pt is currently mobilizing below her baseline due to R UE pain, strength and balance deficits, and fear of falling. Pt requires maxA for rolling but attempts to use bed rails to assist. Pt requires modA to sit EOB for trunk support due to minimal use of R UE to support trunk through the transfer. Pt with mild posterior lean EOB, requiring intermittent minA to correct. Pt declining stand attempt at this time due to FOF and would prefer to use equipment when attempting to stand, will likely benefit from sara stedy. Pt recently discharged to [facility] for rehab and is appropriate to return when medically stable. While in house, pt would benefit from continued PT services focused on bed mobility, strength, balance, and transfers to promote improvements in functional mobility.        If plan is discharge home, recommend the following: Two people to help with walking and/or transfers;Two people to help with bathing/dressing/bathroom;Assistance with cooking/housework;Assist for transportation;Help with stairs or ramp for entrance   Can travel by private vehicle   No    Equipment Recommendations Other (comment) (Defer to next level of care)  Recommendations for Other Services       Functional Status Assessment Patient has had a recent decline in their functional status and demonstrates the  ability to make significant improvements in function in a reasonable and predictable amount of time.     Precautions / Restrictions Precautions Precautions: Fall Recall of Precautions/Restrictions: Intact Precaution/Restrictions Comments: Pt with increased fear of falling Restrictions Weight Bearing Restrictions Per Provider Order: No      Mobility  Bed Mobility Overal bed mobility: Needs Assistance Bed Mobility: Supine to Sit, Sit to Supine, Rolling Rolling: Max assist   Supine to sit: Mod assist Sit to supine: Mod assist   General bed mobility comments: Pt does well bringing B LE over EOB but requires modA for trunk assist due to pt avoiding use of R UE. Lightly uses R UE to support self sitting EOB, but mostly keeps in lap. LE assist to return to bed. MaxA to boost but pushes through B LE when cued.    Transfers                   General transfer comment: Pt declining STS attempt at this time, reports being fearful of falling. Would likely attempt with sara stedy next session. BP also limiting mobility at this time, SBP 80s.    Ambulation/Gait                  Stairs            Wheelchair Mobility     Tilt Bed    Modified Rankin (Stroke Patients Only)       Balance Overall balance assessment: Needs assistance Sitting-balance support: Single extremity supported, Feet supported Sitting balance-Leahy Scale: Poor Sitting balance - Comments: MinA intermittent trunk support while EOB, pt begins to lean backward when performing MMT tasks EOB.  Postural control: Posterior lean     Standing balance comment: Stand not completed this session due to FOF and soft SBP.                             Pertinent Vitals/Pain Pain Assessment Faces Pain Scale: Hurts a little bit Pain Location: R hand Pain Descriptors / Indicators: Aching, Sore Pain Intervention(s): Repositioned, Monitored during session, Limited activity within patient's  tolerance (Minimal use during session)    Home Living Family/patient expects to be discharged to:: Skilled nursing facility                   Additional Comments: Recently discharged to [facility] for rehab.    Prior Function Prior Level of Function : Needs assist             Mobility Comments: Pt reports assistance to stand pivot to manual w/c at SNF, unable to propel w/c independently. Reports difficulty using R hand. ADLs Comments: Reports she has been receiving assist for all BADL tasks at SNF.     Extremity/Trunk Assessment   Upper Extremity Assessment Upper Extremity Assessment: Defer to OT evaluation    Lower Extremity Assessment Lower Extremity Assessment: Generalized weakness (Sensation WNL, global LE strength 4/5 with isolated MMT.)    Cervical / Trunk Assessment Cervical / Trunk Assessment: Kyphotic  Communication   Communication Communication: Impaired Factors Affecting Communication: Hearing impaired    Cognition Arousal: Alert Behavior During Therapy: Anxious   PT - Cognitive impairments: Awareness, Safety/Judgement, Problem solving, Attention                         Following commands: Impaired Following commands impaired: Follows one step commands inconsistently, Follows one step commands with increased time (Repetition often required)     Cueing Cueing Techniques: Verbal cues, Visual cues, Tactile cues     General Comments General comments (skin integrity, edema, etc.): SBP in 80s, pt denies dizziness while EOB but notes some when in trendelenburg for boosting. R hand purple/blue. No additional skin abnormalities noted.    Exercises     Assessment/Plan    PT Assessment Patient needs continued PT services  PT Problem List Decreased strength;Decreased mobility;Decreased safety awareness;Decreased range of motion;Decreased knowledge of precautions;Decreased activity tolerance;Decreased balance;Decreased knowledge of use of  DME;Pain;Decreased skin integrity;Cardiopulmonary status limiting activity       PT Treatment Interventions DME instruction;Therapeutic exercise;Gait training;Balance training;Neuromuscular re-education;Functional mobility training;Therapeutic activities;Patient/family education    PT Goals (Current goals can be found in the Care Plan section)  Acute Rehab PT Goals Patient Stated Goal: Return to rehab PT Goal Formulation: With patient Time For Goal Achievement: 04/12/24 Potential to Achieve Goals: Good    Frequency Min 2X/week     Co-evaluation               AM-PAC PT 6 Clicks Mobility  Outcome Measure Help needed turning from your back to your side while in a flat bed without using bedrails?: A Lot Help needed moving from lying on your back to sitting on the side of a flat bed without using bedrails?: A Lot Help needed moving to and from a bed to a chair (including a wheelchair)?: Total Help needed standing up from a chair using your arms (e.g., wheelchair or bedside chair)?: Total Help needed to walk in hospital room?: Total Help needed climbing 3-5 steps with a railing? : Total 6 Click Score: 8  End of Session Equipment Utilized During Treatment: Oxygen Activity Tolerance: Patient limited by fatigue (Pt limited by FOF) Patient left: in bed;with call bell/phone within reach;with bed alarm set Nurse Communication: Mobility status PT Visit Diagnosis: Unsteadiness on feet (R26.81);Muscle weakness (generalized) (M62.81);Other abnormalities of gait and mobility (R26.89);Pain Pain - Right/Left: Right Pain - part of body: Hand    Time: 8886-8860 PT Time Calculation (min) (ACUTE ONLY): 26 min   Charges:   PT Evaluation $PT Eval Moderate Complexity: 1 Mod   PT General Charges $$ ACUTE PT VISIT: 1 Visit         Sabra Morel, PT, DPT  Acute Rehabilitation Services         Office: [phone]     Sabra MARLA Morel 03/29/2024, 4:08 PM

## 2024-03-29 NOTE — Plan of Care (Signed)
 Patient is progressing towards goals of care.     Problem: Fluid Volume: Goal: Hemodynamic stability will improve Outcome: Progressing   Problem: Clinical Measurements: Goal: Diagnostic test results will improve Outcome: Progressing Goal: Signs and symptoms of infection will decrease Outcome: Progressing   Problem: Respiratory: Goal: Ability to maintain adequate ventilation will improve Outcome: Progressing   Problem: Education: Goal: Ability to describe self-care measures that may prevent or decrease complications (Diabetes Survival Skills Education) will improve Outcome: Progressing Goal: Individualized Educational Video(s) Outcome: Progressing   Problem: Coping: Goal: Ability to adjust to condition or change in health will improve Outcome: Progressing   Problem: Fluid Volume: Goal: Ability to maintain a balanced intake and output will improve Outcome: Progressing   Problem: Health Behavior/Discharge Planning: Goal: Ability to identify and utilize available resources and services will improve Outcome: Progressing Goal: Ability to manage health-related needs will improve Outcome: Progressing   Problem: Metabolic: Goal: Ability to maintain appropriate glucose levels will improve Outcome: Progressing   Problem: Nutritional: Goal: Maintenance of adequate nutrition will improve Outcome: Progressing Goal: Progress toward achieving an optimal weight will improve Outcome: Progressing   Problem: Skin Integrity: Goal: Risk for impaired skin integrity will decrease Outcome: Progressing   Problem: Tissue Perfusion: Goal: Adequacy of tissue perfusion will improve Outcome: Progressing   Problem: Education: Goal: Knowledge of General Education information will improve Description: Including pain rating scale, medication(s)/side effects and non-pharmacologic comfort measures Outcome: Progressing   Problem: Health Behavior/Discharge Planning: Goal: Ability to manage  health-related needs will improve Outcome: Progressing   Problem: Clinical Measurements: Goal: Ability to maintain clinical measurements within normal limits will improve Outcome: Progressing Goal: Will remain free from infection Outcome: Progressing Goal: Diagnostic test results will improve Outcome: Progressing Goal: Respiratory complications will improve Outcome: Progressing Goal: Cardiovascular complication will be avoided Outcome: Progressing   Problem: Activity: Goal: Risk for activity intolerance will decrease Outcome: Progressing   Problem: Nutrition: Goal: Adequate nutrition will be maintained Outcome: Progressing   Problem: Coping: Goal: Level of anxiety will decrease Outcome: Progressing   Problem: Elimination: Goal: Will not experience complications related to bowel motility Outcome: Progressing Goal: Will not experience complications related to urinary retention Outcome: Progressing   Problem: Pain Managment: Goal: General experience of comfort will improve and/or be controlled Outcome: Progressing   Problem: Safety: Goal: Ability to remain free from injury will improve Outcome: Progressing   Problem: Skin Integrity: Goal: Risk for impaired skin integrity will decrease Outcome: Progressing

## 2024-03-29 NOTE — Progress Notes (Signed)
"  °  Progress Note  Patient Name: Dana Bradford Date of Encounter: 03/29/2024 [facility] Cardiologist: Annabella Scarce, MD   Interval Summary   Denied chest pain, feels like her breathing is better.   Vital Signs Vitals:   03/28/24 2000 03/29/24 0000 03/29/24 0200 03/29/24 0500  BP:  91/76    Pulse:  93 90   Resp:  12 15   Temp: 98.2 F (36.8 C)     TempSrc: Oral     SpO2:  95% 97%   Weight:    65.1 kg  Height:        Intake/Output Summary (Last 24 hours) at 03/29/2024 0731 Last data filed at 03/28/2024 0800 Gross per 24 hour  Intake 200 ml  Output --  Net 200 ml      03/29/2024    5:00 AM 03/25/2024    6:36 PM 03/08/2024   10:23 PM  Last 3 Weights  Weight (lbs) 143 lb 8.3 oz 141 lb 1.5 oz 139 lb 12.4 oz  Weight (kg) 65.1 kg 64 kg 63.4 kg      Telemetry/ECG  AF HR ~90 TWI - Personally Reviewed  Physical Exam  GEN: No acute distress.   Neck: No JVD Cardiac:Irregularly, irregular, no murmurs Respiratory: Clear to auscultation bilaterally. GI: Soft, nontender, non-distended  MS: No edema Skin: warm and dry  Assessment & Plan  89 y.o. 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 This is 3rd admission in January. Admitted 1/8-1/13/26 with Covid-19 URI c/b AF RVR (in setting of unable to tolerate oral meds) then bradycardia requiring of adjustment of metoprolol , metabolic encephalopathy, electrolyte disturbances. Readmitted 1/16-1/24/26 with continued complications with hypoxia, felt possibly viral vs superimposed bactereial PNA, treated with antiviral/antibiotics and O2. Readmitted 03/25/2024 with SOB and hypoxia with multifocal PNA, treated with additional abx. Cardiology following for AF RVR.    Permanent Atrial Fibrillation, with episode of RVR Chad Vas score 8 Keep K > 4 and Mag > 2  She is currently rate controlled.   Continue eliquis  2.5 mg BID [dose reduced for age and weight]. Noted to have hgb of 9.4  Continue metoprolol  tartrate 12.5 mg BID. Continue to monitor BP.   Hypotension BP: 91/76 Continue to monitor with BB use as above with BP hold parameters  Management otherwise per primary  Chronic HFpEF Echo this admission showed LVEF 60-65% with moderate concentric LVH. RV nl in size with mildly reduced function. PASP 37.7 mmHg. Massively dilated LA. RAP 15 mmHg.  Diuresis has been held 2/2 hypotension.  Weight +3lb since admission. Stable renal function.   ProBNP this am 6616 On exam, appears euvolemic even with severe hypoalbuminemia [2.4].   As her respiratory status is improving, and renal function stable with no overt signs of volume overload on exam will hold on diuresis for today with hypotension.  SGLT2i and MRA not pursued with hypotension  Hyperlipidemia Continue pravastatin   Per primary Pneumonia with acute hypoxic respiratory failure Anemia Electrolyte Disturbances T2DM CKD Hypothyroidism Hx of CVA   For questions or updates, please contact [facility] Please consult [email or web address] for contact info under       Signed, Leontine LOISE Salen, PA-C   "

## 2024-03-29 NOTE — TOC Progression Note (Signed)
 Transition of Care [facility]) - Progression Note    Patient Details  Name: Dana Bradford MRN: [id number] Date of Birth: 1930/11/02  [facility] CM/SW Contact  Inocente GORMAN Kindle, LCSW Phone Number: 03/29/2024, 2:05 PM  Clinical Narrative:    CSW confirmed with [facility] that they can admit patient on Sunday per MD if stable. Contact will be Cobi [phone].     Expected Discharge Plan: Skilled Nursing Facility Barriers to Discharge: Continued Medical Work up               Expected Discharge Plan and Services In-house Referral: Clinical Social Work   Post Acute Care Choice: Skilled Nursing Facility Living arrangements for the past 2 months: Single Family Home                                       Social Drivers of Health (SDOH) Interventions SDOH Screenings   Food Insecurity: No Food Insecurity (03/27/2024)  Housing: Low Risk (03/27/2024)  Transportation Needs: No Transportation Needs (03/27/2024)  Utilities: Not At Risk (03/27/2024)  Alcohol  Screen: Low Risk (03/11/2024)  Financial Resource Strain: Patient Declined (06/25/2023)  Physical Activity: Unknown (06/25/2023)  Social Connections: Socially Isolated (03/27/2024)  Stress: Patient Declined (06/25/2023)  Tobacco Use: Medium Risk (03/27/2024)    Readmission Risk Interventions    03/27/2024    8:47 AM 03/03/2024    3:17 PM  Readmission Risk Prevention Plan  Transportation Screening Complete Complete  PCP or Specialist Appt within 5-7 Days  Complete  Home Care Screening  Complete  Medication Review (RN CM)  Complete  Medication Review (RN Care Manager) Complete   PCP or Specialist appointment within 3-5 days of discharge Complete   HRI or Home Care Consult Complete   SW Recovery Care/Counseling Consult Complete   Palliative Care Screening Not Applicable   Skilled Nursing Facility Complete

## 2024-03-29 NOTE — [place] FL2 (Signed)
 " [place]  MEDICAID FL2 LEVEL OF CARE FORM     IDENTIFICATION  Patient Name: Dana Bradford Birthdate: [birth year] Sex: female Admission Date (Current Location): 03/25/2024  [facility] [place] and [place] Number:  Producer, Television/film/video and Address:  The . [facility], [address], [place], [place] [address]      Provider Number: [id number]  Attending Physician Name and Address:  Tobie Yetta HERO, MD  Relative Name and Phone Number:  [name]  Daughter, Emergency Contact  [phone] [facility] Phone)    Current Level of Care: Hospital Recommended Level of Care: Skilled Nursing Facility Prior Approval Number:    Date Approved/Denied:   PASRR Number: [id number]  Discharge Plan: SNF    Current Diagnoses: Patient Active Problem List   Diagnosis Date Noted   Chronic atrial fibrillation with RVR (HCC) 03/26/2024   Essential hypertension 03/26/2024   Type 2 diabetes mellitus with diabetic neuropathy (HCC) 03/26/2024   GERD without esophagitis 03/26/2024   RLS (restless legs syndrome) 03/26/2024   Sepsis due to pneumonia (HCC) 03/25/2024   Acute respiratory failure with hypoxia (HCC) 03/08/2024   Gout 03/01/2024   Atrial fibrillation with RVR (HCC) 03/01/2024   COVID-19 02/29/2024   Malignant neoplasm of overlapping sites of left female breast (HCC) 09/26/2022   Osteoporosis 09/16/2021   Frequent UTI 06/25/2021   Stage 3b chronic kidney disease (HCC) 02/23/2021   Chronic anticoagulation 05/12/2020   Venous stasis ulcer (HCC)    Skin cancer    Glaucoma    Food allergy    Permanent atrial fibrillation (HCC)    Arthritis    Anemia    CHF (congestive heart failure) (HCC) 04/22/2020   History of revision of total replacement of left hip joint 01/31/2020   Hypothyroidism 01/19/2020   Hyperlipidemia 01/19/2020   Degeneration of lumbar intervertebral disc 12/02/2019   Degenerative spondylolisthesis 12/02/2019   Scoliosis deformity of spine 12/02/2019   Back pain  08/24/2019   Chronic venous insufficiency 03/01/2019   Diabetic ulcer of toe of right foot associated with type 2 diabetes mellitus, with fat layer exposed (HCC) 02/08/2019   Venous stasis dermatitis of left lower extremity 06/05/2018   OA (osteoarthritis) of hip 02/28/2018   Pain in joint of right hip 02/06/2018   Controlled type 2 diabetes mellitus with hyperglycemia, without long-term current use of insulin  (HCC) 10/18/2017   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 macular degeneration of left eye 10/18/2017   Primary open angle glaucoma (POAG) of both eyes, indeterminate stage 10/18/2017   Retinal artery branch occlusion of right eye 06/12/2017   History of CVA (cerebrovascular accident) 05/2017   Status post amputation of lesser toe of right foot 05/13/2017   Lymphedema of both lower extremities 12/26/2014   Venous hypertension of both lower extremities 12/26/2014   Vaginal vault prolapse 08/12/2014   GERD (gastroesophageal reflux disease) 11/13/2012   Essential hypertension, benign 06/17/2012   Restless leg syndrome 06/05/2012    Orientation RESPIRATION BLADDER Height & Weight     Self  O2 (NL 2L/min) Incontinent, External catheter Weight: 143 lb 8.3 oz (65.1 kg) Height:  4' 10 (147.3 cm)  BEHAVIORAL SYMPTOMS/MOOD NEUROLOGICAL BOWEL NUTRITION STATUS      Continent Diet  AMBULATORY STATUS COMMUNICATION OF NEEDS Skin   Extensive Assist Verbally PU Stage and Appropriate Care, Other (Comment) (stage 3 PI right heel, vascular Ulcer on right ankle)                       Personal  Care Assistance Level of Assistance  Bathing, Feeding, Dressing Bathing Assistance: Maximum assistance Feeding assistance: Limited assistance Dressing Assistance: Maximum assistance     Functional Limitations Info  Hearing Sight Info: Adequate Hearing Info: Impaired Speech Info: Adequate    SPECIAL CARE FACTORS FREQUENCY  PT (By licensed PT), OT (By licensed OT)     PT Frequency:  5x/week OT Frequency: 5x/week            Contractures Contractures Info: Not present    Additional Factors Info  Code Status, Allergies, Insulin  Sliding Scale Code Status Info: DNR Limited Allergies Info: Arthrotec (diclofenac -misoprostol) High Hypersensitivity Shortness Of Breath, Diarrhea Doxycycline Medium Nausea And Vomiting, Rash Significant enough to d/c treatment and refuse to take more Vomiting (intolerance) The first time she took it she had N/V and the second she had a rash. Latex Medium Rash, Other (See Comments) Amlodipine Not Specified Swelling angioedema Celebrex (celecoxib) Not Specified Intolerance Other (See Comments) GI Bleeding Vancomycin  Not Specified Other (See Comments) Acute kidney failure Diclofenac  Low Diarrhea Shellfish Allergy Low Itching, Rash   Insulin  Sliding Scale Info: see dc summary       Current Medications (03/29/2024):  This is the current hospital active medication list Current Facility-Administered Medications  Medication Dose Route Frequency Provider Last Rate Last Admin   acetaminophen  (TYLENOL ) tablet 650 mg  650 mg Oral Q6H PRN Mansy, Jan A, MD   650 mg at 03/26/24 1157   Or   acetaminophen  (TYLENOL ) suppository 650 mg  650 mg Rectal Q6H PRN Mansy, Jan A, MD       amoxicillin -clavulanate (AUGMENTIN ) 875-125 MG per tablet 1 tablet  1 tablet Oral Q12H Patel, Pranav M, MD   1 tablet at 03/28/24 1120   apixaban  (ELIQUIS ) tablet 2.5 mg  2.5 mg Oral BID Patel, Pranav M, MD       ascorbic acid  (VITAMIN C ) tablet 1,000 mg  1,000 mg Oral Once per day on Monday Wednesday Friday Mansy, Jan A, MD   1,000 mg at 03/27/24 9081   B-complex with vitamin C  tablet 1 tablet  1 tablet Oral Daily Patel, Pranav M, MD   1 tablet at 03/28/24 1329   cholecalciferol  (VITAMIN D3) 25 MCG (1000 UNIT) tablet 2,000 Units  2,000 Units Oral Daily Mansy, Jan A, MD   2,000 Units at 03/28/24 1120   colchicine  tablet 0.6 mg  0.6 mg Oral Daily Mansy, Jan A, MD   0.6 mg at 03/28/24 1120    feeding supplement (ENSURE PLUS HIGH PROTEIN) liquid 237 mL  237 mL Oral BID BM Mansy, Jan A, MD   237 mL at 03/27/24 9081   ferrous sulfate  tablet 325 mg  325 mg Oral Q breakfast Tobie Yetta HERO, MD       gabapentin  (NEURONTIN ) capsule 100 mg  100 mg Oral TID Mansy, Jan A, MD   100 mg at 03/28/24 1853   insulin  aspart (novoLOG ) injection 0-6 Units  0-6 Units Subcutaneous TID WC Al-Sultani, Anmar, MD       levothyroxine  (SYNTHROID ) tablet 88 mcg  88 mcg Oral QAC breakfast Al-Sultani, Anmar, MD   88 mcg at 03/28/24 0525   liver oil-zinc  oxide (DESITIN) 40 % ointment   Topical Continuous PRN Al-Sultani, Anmar, MD       magnesium  hydroxide (MILK OF MAGNESIA) suspension 30 mL  30 mL Oral Daily PRN Mansy, Jan A, MD       melatonin tablet 5 mg  5 mg Oral QHS PRN Mansy, Madison LABOR, MD  5 mg at 03/27/24 2111   metoprolol  tartrate (LOPRESSOR ) tablet 12.5 mg  12.5 mg Oral BID Raford Riggs, MD   12.5 mg at 03/28/24 1122   ondansetron  (ZOFRAN ) tablet 4 mg  4 mg Oral Q6H PRN Mansy, Jan A, MD       Or   ondansetron  (ZOFRAN ) injection 4 mg  4 mg Intravenous Q6H PRN Mansy, Jan A, MD       pantoprazole  (PROTONIX ) EC tablet 40 mg  40 mg Oral Daily Patel, Pranav M, MD   40 mg at 03/28/24 1120   pramipexole  (MIRAPEX ) tablet 1 mg  1 mg Oral QHS Mansy, Jan A, MD   1 mg at 03/27/24 2111   pravastatin  (PRAVACHOL ) tablet 20 mg  20 mg Oral q1800 Mansy, Jan A, MD   20 mg at 03/28/24 8147   predniSONE  (DELTASONE ) tablet 40 mg  40 mg Oral Q breakfast Patel, Pranav M, MD   40 mg at 03/28/24 1117   traMADol  (ULTRAM ) tablet 50 mg  50 mg Oral Q6H PRN Patel, Pranav M, MD         Discharge Medications: Please see discharge summary for a list of discharge medications.  Relevant Imaging Results:  Relevant Lab Results:   Additional Information SSN [id number]  Sharyne Drum, Student-Social Work     "
